# Patient Record
Sex: Male | Born: 1955
Health system: Southern US, Community
[De-identification: ages and names within clinical notes are randomized; demographics above are authoritative.]

## PROBLEM LIST (undated history)

## (undated) DIAGNOSIS — N289 Disorder of kidney and ureter, unspecified: Secondary | ICD-10-CM

## (undated) DIAGNOSIS — E269 Hyperaldosteronism, unspecified: Secondary | ICD-10-CM

## (undated) DIAGNOSIS — T4145XA Adverse effect of unspecified anesthetic, initial encounter: Secondary | ICD-10-CM

## (undated) DIAGNOSIS — T8859XA Other complications of anesthesia, initial encounter: Secondary | ICD-10-CM

## (undated) DIAGNOSIS — I4891 Unspecified atrial fibrillation: Secondary | ICD-10-CM

## (undated) DIAGNOSIS — N419 Inflammatory disease of prostate, unspecified: Secondary | ICD-10-CM

## (undated) DIAGNOSIS — I4892 Unspecified atrial flutter: Secondary | ICD-10-CM

## (undated) DIAGNOSIS — I10 Essential (primary) hypertension: Secondary | ICD-10-CM

## (undated) DIAGNOSIS — N200 Calculus of kidney: Secondary | ICD-10-CM

## (undated) DIAGNOSIS — K219 Gastro-esophageal reflux disease without esophagitis: Secondary | ICD-10-CM

## (undated) DIAGNOSIS — E270 Other adrenocortical overactivity: Secondary | ICD-10-CM

## (undated) DIAGNOSIS — N281 Cyst of kidney, acquired: Secondary | ICD-10-CM

## (undated) DIAGNOSIS — I517 Cardiomegaly: Secondary | ICD-10-CM

## (undated) DIAGNOSIS — M1990 Unspecified osteoarthritis, unspecified site: Secondary | ICD-10-CM

## (undated) DIAGNOSIS — J189 Pneumonia, unspecified organism: Secondary | ICD-10-CM

## (undated) DIAGNOSIS — I499 Cardiac arrhythmia, unspecified: Secondary | ICD-10-CM

## (undated) DIAGNOSIS — R7303 Prediabetes: Secondary | ICD-10-CM

## (undated) HISTORY — PX: INGUINAL HERNIA REPAIR: SUR1180

## (undated) HISTORY — PX: ABLATION: SHX5711

## (undated) HISTORY — DX: Cardiomegaly: I51.7

## (undated) HISTORY — DX: Inflammatory disease of prostate, unspecified: N41.9

## (undated) HISTORY — DX: Unspecified atrial flutter: I48.92

## (undated) HISTORY — DX: Calculus of kidney: N20.0

## (undated) HISTORY — DX: Other adrenocortical overactivity: E27.0

## (undated) HISTORY — DX: Gastro-esophageal reflux disease without esophagitis: K21.9

## (undated) HISTORY — DX: Unspecified atrial fibrillation: I48.91

## (undated) HISTORY — DX: Essential (primary) hypertension: I10

## (undated) HISTORY — PX: OTHER SURGICAL HISTORY: SHX169

---

## 1989-03-26 DIAGNOSIS — J189 Pneumonia, unspecified organism: Secondary | ICD-10-CM

## 1989-03-26 HISTORY — DX: Pneumonia, unspecified organism: J18.9

## 1991-07-27 HISTORY — PX: LUNG BIOPSY: SHX232

## 2001-12-20 ENCOUNTER — Ambulatory Visit (HOSPITAL_COMMUNITY): Admission: RE | Admit: 2001-12-20 | Discharge: 2001-12-20 | Payer: Self-pay | Admitting: Otolaryngology

## 2001-12-21 ENCOUNTER — Ambulatory Visit (HOSPITAL_COMMUNITY): Admission: RE | Admit: 2001-12-21 | Discharge: 2001-12-21 | Payer: Self-pay | Admitting: Cardiology

## 2003-08-05 ENCOUNTER — Encounter: Admission: RE | Admit: 2003-08-05 | Discharge: 2003-08-05 | Payer: Self-pay | Admitting: Internal Medicine

## 2004-05-19 ENCOUNTER — Ambulatory Visit (HOSPITAL_COMMUNITY): Admission: RE | Admit: 2004-05-19 | Discharge: 2004-05-19 | Payer: Self-pay | Admitting: *Deleted

## 2004-06-22 ENCOUNTER — Ambulatory Visit (HOSPITAL_COMMUNITY): Admission: RE | Admit: 2004-06-22 | Discharge: 2004-06-22 | Payer: Self-pay | Admitting: Cardiology

## 2004-06-22 ENCOUNTER — Encounter (INDEPENDENT_AMBULATORY_CARE_PROVIDER_SITE_OTHER): Payer: Self-pay | Admitting: Cardiology

## 2004-06-24 ENCOUNTER — Encounter (INDEPENDENT_AMBULATORY_CARE_PROVIDER_SITE_OTHER): Payer: Self-pay | Admitting: *Deleted

## 2004-06-24 ENCOUNTER — Ambulatory Visit (HOSPITAL_COMMUNITY): Admission: RE | Admit: 2004-06-24 | Discharge: 2004-06-24 | Payer: Self-pay | Admitting: Gastroenterology

## 2004-06-25 HISTORY — PX: OTHER SURGICAL HISTORY: SHX169

## 2004-07-14 ENCOUNTER — Ambulatory Visit (HOSPITAL_COMMUNITY): Admission: AD | Admit: 2004-07-14 | Discharge: 2004-07-14 | Payer: Self-pay | Admitting: Urology

## 2005-09-17 ENCOUNTER — Ambulatory Visit (HOSPITAL_COMMUNITY): Admission: RE | Admit: 2005-09-17 | Discharge: 2005-09-17 | Payer: Self-pay | Admitting: Internal Medicine

## 2005-11-23 ENCOUNTER — Encounter: Admission: RE | Admit: 2005-11-23 | Discharge: 2005-11-23 | Payer: Self-pay | Admitting: Cardiology

## 2005-11-23 ENCOUNTER — Ambulatory Visit: Payer: Self-pay | Admitting: Internal Medicine

## 2005-12-14 ENCOUNTER — Ambulatory Visit: Payer: Self-pay | Admitting: Internal Medicine

## 2006-07-26 HISTORY — PX: UMBILICAL HERNIA REPAIR: SHX196

## 2006-07-26 HISTORY — PX: NASAL SEPTOPLASTY W/ TURBINOPLASTY: SHX2070

## 2006-08-09 ENCOUNTER — Ambulatory Visit (HOSPITAL_BASED_OUTPATIENT_CLINIC_OR_DEPARTMENT_OTHER): Admission: RE | Admit: 2006-08-09 | Discharge: 2006-08-09 | Payer: Self-pay | Admitting: Surgery

## 2006-10-19 ENCOUNTER — Encounter: Admission: RE | Admit: 2006-10-19 | Discharge: 2006-10-19 | Payer: Self-pay | Admitting: Internal Medicine

## 2007-06-17 ENCOUNTER — Emergency Department (HOSPITAL_COMMUNITY): Admission: EM | Admit: 2007-06-17 | Discharge: 2007-06-17 | Payer: Self-pay | Admitting: Emergency Medicine

## 2009-07-26 HISTORY — PX: OTHER SURGICAL HISTORY: SHX169

## 2009-11-30 ENCOUNTER — Inpatient Hospital Stay (HOSPITAL_COMMUNITY): Admission: EM | Admit: 2009-11-30 | Discharge: 2009-12-03 | Payer: Self-pay | Admitting: Urology

## 2009-12-12 ENCOUNTER — Encounter: Admission: RE | Admit: 2009-12-12 | Discharge: 2009-12-12 | Payer: Self-pay | Admitting: Thoracic Surgery

## 2010-01-02 ENCOUNTER — Ambulatory Visit (HOSPITAL_COMMUNITY): Admission: RE | Admit: 2010-01-02 | Discharge: 2010-01-02 | Payer: Self-pay | Admitting: Thoracic Surgery

## 2010-01-15 ENCOUNTER — Ambulatory Visit: Payer: Self-pay | Admitting: Thoracic Surgery

## 2010-01-20 ENCOUNTER — Ambulatory Visit: Payer: Self-pay | Admitting: Cardiothoracic Surgery

## 2010-01-22 ENCOUNTER — Inpatient Hospital Stay (HOSPITAL_COMMUNITY): Admission: RE | Admit: 2010-01-22 | Discharge: 2010-01-25 | Payer: Self-pay | Admitting: Thoracic Surgery

## 2010-01-22 ENCOUNTER — Encounter: Payer: Self-pay | Admitting: Cardiothoracic Surgery

## 2010-01-22 ENCOUNTER — Ambulatory Visit: Payer: Self-pay | Admitting: Cardiothoracic Surgery

## 2010-01-30 ENCOUNTER — Ambulatory Visit: Payer: Self-pay | Admitting: Cardiothoracic Surgery

## 2010-02-12 ENCOUNTER — Ambulatory Visit: Payer: Self-pay | Admitting: Cardiothoracic Surgery

## 2010-02-12 ENCOUNTER — Encounter: Admission: RE | Admit: 2010-02-12 | Discharge: 2010-02-12 | Payer: Self-pay | Admitting: Cardiothoracic Surgery

## 2010-05-14 ENCOUNTER — Ambulatory Visit: Payer: Self-pay | Admitting: Cardiothoracic Surgery

## 2010-05-14 ENCOUNTER — Encounter: Admission: RE | Admit: 2010-05-14 | Discharge: 2010-05-14 | Payer: Self-pay | Admitting: Cardiothoracic Surgery

## 2010-06-22 ENCOUNTER — Emergency Department (HOSPITAL_COMMUNITY)
Admission: EM | Admit: 2010-06-22 | Discharge: 2010-06-22 | Payer: Self-pay | Source: Home / Self Care | Admitting: Emergency Medicine

## 2010-08-01 ENCOUNTER — Emergency Department (HOSPITAL_COMMUNITY)
Admission: EM | Admit: 2010-08-01 | Discharge: 2010-08-01 | Payer: Self-pay | Source: Home / Self Care | Admitting: Emergency Medicine

## 2010-08-10 LAB — POCT I-STAT, CHEM 8
BUN: 20 mg/dL (ref 6–23)
Calcium, Ion: 1.05 mmol/L — ABNORMAL LOW (ref 1.12–1.32)
Chloride: 104 mEq/L (ref 96–112)
Creatinine, Ser: 1.5 mg/dL (ref 0.4–1.5)
Glucose, Bld: 108 mg/dL — ABNORMAL HIGH (ref 70–99)
HCT: 48 % (ref 39.0–52.0)
Hemoglobin: 16.3 g/dL (ref 13.0–17.0)
Potassium: 3.4 mEq/L — ABNORMAL LOW (ref 3.5–5.1)
Sodium: 142 mEq/L (ref 135–145)
TCO2: 29 mmol/L (ref 0–100)

## 2010-10-06 LAB — CBC
HCT: 43.2 % (ref 39.0–52.0)
Hemoglobin: 14.2 g/dL (ref 13.0–17.0)
MCH: 28.5 pg (ref 26.0–34.0)
MCHC: 32.9 g/dL (ref 30.0–36.0)
MCV: 86.7 fL (ref 78.0–100.0)
Platelets: 187 10*3/uL (ref 150–400)
RBC: 4.98 MIL/uL (ref 4.22–5.81)
RDW: 15.4 % (ref 11.5–15.5)
WBC: 8.8 10*3/uL (ref 4.0–10.5)

## 2010-10-06 LAB — DIFFERENTIAL
Basophils Absolute: 0 10*3/uL (ref 0.0–0.1)
Basophils Relative: 0 % (ref 0–1)
Eosinophils Absolute: 0.1 10*3/uL (ref 0.0–0.7)
Eosinophils Relative: 1 % (ref 0–5)
Lymphocytes Relative: 16 % (ref 12–46)
Lymphs Abs: 1.4 10*3/uL (ref 0.7–4.0)
Monocytes Absolute: 0.5 10*3/uL (ref 0.1–1.0)
Monocytes Relative: 6 % (ref 3–12)
Neutro Abs: 6.7 10*3/uL (ref 1.7–7.7)
Neutrophils Relative %: 77 % (ref 43–77)

## 2010-10-06 LAB — PROTIME-INR
INR: 3.04 — ABNORMAL HIGH (ref 0.00–1.49)
Prothrombin Time: 31.5 seconds — ABNORMAL HIGH (ref 11.6–15.2)

## 2010-10-06 LAB — POCT I-STAT, CHEM 8
BUN: 23 mg/dL (ref 6–23)
Chloride: 103 mEq/L (ref 96–112)
Sodium: 142 mEq/L (ref 135–145)

## 2010-10-06 LAB — POCT CARDIAC MARKERS
CKMB, poc: 2.9 ng/mL (ref 1.0–8.0)
Myoglobin, poc: 347 ng/mL (ref 12–200)
Troponin i, poc: <0.05 ng/mL (ref 0.00–0.09)

## 2010-10-11 LAB — BASIC METABOLIC PANEL
BUN: 13 mg/dL (ref 6–23)
BUN: 14 mg/dL (ref 6–23)
CO2: 27 mEq/L (ref 19–32)
CO2: 33 mEq/L — ABNORMAL HIGH (ref 19–32)
Calcium: 8.3 mg/dL — ABNORMAL LOW (ref 8.4–10.5)
Calcium: 9.3 mg/dL (ref 8.4–10.5)
Chloride: 101 mEq/L (ref 96–112)
Chloride: 96 mEq/L (ref 96–112)
Creatinine, Ser: 1.19 mg/dL (ref 0.4–1.5)
Creatinine, Ser: 1.25 mg/dL (ref 0.4–1.5)
GFR calc Af Amer: >60 mL/min (ref >60)
GFR calc Af Amer: >60 mL/min (ref >60)
GFR calc non Af Amer: 60 mL/min — ABNORMAL LOW (ref >60)
GFR calc non Af Amer: >60 mL/min (ref >60)
Glucose, Bld: 122 mg/dL — ABNORMAL HIGH (ref 70–99)
Glucose, Bld: 125 mg/dL — ABNORMAL HIGH (ref 70–99)
Potassium: 3.2 mEq/L — ABNORMAL LOW (ref 3.5–5.1)
Potassium: 3.7 mEq/L (ref 3.5–5.1)
Sodium: 136 mEq/L (ref 135–145)
Sodium: 137 mEq/L (ref 135–145)

## 2010-10-11 LAB — COMPREHENSIVE METABOLIC PANEL
ALT: 18 U/L (ref 0–53)
ALT: 23 U/L (ref 0–53)
AST: 30 U/L (ref 0–37)
AST: 28 U/L (ref 0–37)
Albumin: 3 g/dL — ABNORMAL LOW (ref 3.5–5.2)
Albumin: 3.6 g/dL (ref 3.5–5.2)
Alkaline Phosphatase: 49 U/L (ref 39–117)
BUN: 15 mg/dL (ref 6–23)
CO2: 31 mEq/L (ref 19–32)
CO2: 29 mEq/L (ref 19–32)
Calcium: 8.7 mg/dL (ref 8.4–10.5)
Calcium: 9.2 mg/dL (ref 8.4–10.5)
Chloride: 97 mEq/L (ref 96–112)
Creatinine, Ser: 1.47 mg/dL (ref 0.4–1.5)
Creatinine, Ser: 1.4 mg/dL (ref 0.4–1.5)
GFR calc Af Amer: >60 mL/min (ref >60)
GFR calc Af Amer: >60 mL/min (ref >60)
GFR calc non Af Amer: 50 mL/min — ABNORMAL LOW (ref >60)
GFR calc non Af Amer: 53 mL/min — ABNORMAL LOW (ref >60)
Glucose, Bld: 134 mg/dL — ABNORMAL HIGH (ref 70–99)
Potassium: 3.5 mEq/L (ref 3.5–5.1)
Sodium: 135 mEq/L (ref 135–145)
Sodium: 136 mEq/L (ref 135–145)
Total Bilirubin: 0.7 mg/dL (ref 0.3–1.2)
Total Protein: 5.8 g/dL — ABNORMAL LOW (ref 6.0–8.3)
Total Protein: 6.5 g/dL (ref 6.0–8.3)

## 2010-10-11 LAB — BLOOD GAS, ARTERIAL
Bicarbonate: 26.2 mEq/L — ABNORMAL HIGH (ref 20.0–24.0)
Drawn by: 206361
FIO2: 0.21 %
Patient temperature: 98.6
pH, Arterial: 7.462 — ABNORMAL HIGH (ref 7.350–7.450)
pO2, Arterial: 84 mmHg (ref 80.0–100.0)

## 2010-10-11 LAB — CBC
HCT: 35.7 % — ABNORMAL LOW (ref 39.0–52.0)
HCT: 36.4 % — ABNORMAL LOW (ref 39.0–52.0)
HCT: 37.3 % — ABNORMAL LOW (ref 39.0–52.0)
Hemoglobin: 11.7 g/dL — ABNORMAL LOW (ref 13.0–17.0)
Hemoglobin: 12.3 g/dL — ABNORMAL LOW (ref 13.0–17.0)
Hemoglobin: 12.5 g/dL — ABNORMAL LOW (ref 13.0–17.0)
Hemoglobin: 14 g/dL (ref 13.0–17.0)
MCH: 29.6 pg (ref 26.0–34.0)
MCH: 30.2 pg (ref 26.0–34.0)
MCH: 30.4 pg (ref 26.0–34.0)
MCHC: 32.7 g/dL (ref 30.0–36.0)
MCHC: 33.7 g/dL (ref 30.0–36.0)
MCHC: 33.8 g/dL (ref 30.0–36.0)
MCHC: 33.1 g/dL (ref 30.0–36.0)
MCV: 89.7 fL (ref 78.0–100.0)
MCV: 89.8 fL (ref 78.0–100.0)
MCV: 90.6 fL (ref 78.0–100.0)
Platelets: 134 10*3/uL — ABNORMAL LOW (ref 150–400)
Platelets: 146 10*3/uL — ABNORMAL LOW (ref 150–400)
Platelets: 164 10*3/uL (ref 150–400)
Platelets: 192 10*3/uL (ref 150–400)
RBC: 3.94 MIL/uL — ABNORMAL LOW (ref 4.22–5.81)
RBC: 4.05 MIL/uL — ABNORMAL LOW (ref 4.22–5.81)
RBC: 4.15 MIL/uL — ABNORMAL LOW (ref 4.22–5.81)
RBC: 4.76 MIL/uL (ref 4.22–5.81)
RDW: 16.1 % — ABNORMAL HIGH (ref 11.5–15.5)
RDW: 16.5 % — ABNORMAL HIGH (ref 11.5–15.5)
RDW: 16.6 % — ABNORMAL HIGH (ref 11.5–15.5)
WBC: 10.2 10*3/uL (ref 4.0–10.5)
WBC: 11.4 10*3/uL — ABNORMAL HIGH (ref 4.0–10.5)
WBC: 11.8 10*3/uL — ABNORMAL HIGH (ref 4.0–10.5)

## 2010-10-11 LAB — APTT: aPTT: 29 seconds (ref 24–37)

## 2010-10-11 LAB — GLUCOSE, CAPILLARY
Glucose-Capillary: 119 mg/dL — ABNORMAL HIGH (ref 70–99)
Glucose-Capillary: 141 mg/dL — ABNORMAL HIGH (ref 70–99)
Glucose-Capillary: 126 mg/dL — ABNORMAL HIGH (ref 70–99)
Glucose-Capillary: 141 mg/dL — ABNORMAL HIGH (ref 70–99)

## 2010-10-11 LAB — TYPE AND SCREEN

## 2010-10-11 LAB — URINE CULTURE: Culture: NO GROWTH

## 2010-10-11 LAB — POCT I-STAT 3, ART BLOOD GAS (G3+)
Bicarbonate: 29 mEq/L — ABNORMAL HIGH (ref 20.0–24.0)
Patient temperature: 99.2
pH, Arterial: 7.42 (ref 7.350–7.450)
pO2, Arterial: 78 mmHg — ABNORMAL LOW (ref 80.0–100.0)

## 2010-10-11 LAB — URINE MICROSCOPIC-ADD ON

## 2010-10-11 LAB — URINALYSIS, ROUTINE W REFLEX MICROSCOPIC
Glucose, UA: NEGATIVE mg/dL
Nitrite: NEGATIVE
Specific Gravity, Urine: 1.014 (ref 1.005–1.030)
pH: 5.5 (ref 5.0–8.0)

## 2010-10-11 LAB — ABO/RH: ABO/RH(D): B NEG

## 2010-10-12 LAB — PROTIME-INR
INR: 1.11 (ref 0.00–1.49)
Prothrombin Time: 14.2 seconds (ref 11.6–15.2)

## 2010-10-12 LAB — APTT: aPTT: 33 seconds (ref 24–37)

## 2010-10-12 LAB — CBC
HCT: 40.1 % (ref 39.0–52.0)
Hemoglobin: 13 g/dL (ref 13.0–17.0)
MCV: 89.8 fL (ref 78.0–100.0)
Platelets: 195 10*3/uL (ref 150–400)
RDW: 16 % — ABNORMAL HIGH (ref 11.5–15.5)
WBC: 8.1 10*3/uL (ref 4.0–10.5)

## 2010-10-13 LAB — BASIC METABOLIC PANEL
BUN: 17 mg/dL (ref 6–23)
BUN: 9 mg/dL (ref 6–23)
CO2: 29 mEq/L (ref 19–32)
CO2: 30 mEq/L (ref 19–32)
Calcium: 8.5 mg/dL (ref 8.4–10.5)
Calcium: 8.6 mg/dL (ref 8.4–10.5)
Chloride: 105 mEq/L (ref 96–112)
Creatinine, Ser: 1.42 mg/dL (ref 0.4–1.5)
GFR calc Af Amer: >60 mL/min (ref >60)
GFR calc non Af Amer: 47 mL/min — ABNORMAL LOW (ref >60)
GFR calc non Af Amer: 56 mL/min — ABNORMAL LOW (ref >60)
Glucose, Bld: 108 mg/dL — ABNORMAL HIGH (ref 70–99)
Glucose, Bld: 132 mg/dL — ABNORMAL HIGH (ref 70–99)
Glucose, Bld: 142 mg/dL — ABNORMAL HIGH (ref 70–99)
Potassium: 3.2 mEq/L — ABNORMAL LOW (ref 3.5–5.1)
Potassium: 3.3 mEq/L — ABNORMAL LOW (ref 3.5–5.1)
Sodium: 134 mEq/L — ABNORMAL LOW (ref 135–145)
Sodium: 139 mEq/L (ref 135–145)

## 2010-10-13 LAB — CULTURE, BLOOD (ROUTINE X 2): Culture: NO GROWTH

## 2010-10-13 LAB — CBC
HCT: 36 % — ABNORMAL LOW (ref 39.0–52.0)
HCT: 36.7 % — ABNORMAL LOW (ref 39.0–52.0)
Hemoglobin: 12 g/dL — ABNORMAL LOW (ref 13.0–17.0)
Hemoglobin: 12.3 g/dL — ABNORMAL LOW (ref 13.0–17.0)
MCHC: 32.9 g/dL (ref 30.0–36.0)
MCV: 89.6 fL (ref 78.0–100.0)
Platelets: 156 10*3/uL (ref 150–400)
Platelets: 191 10*3/uL (ref 150–400)
RBC: 4 MIL/uL — ABNORMAL LOW (ref 4.22–5.81)
RBC: 4.07 MIL/uL — ABNORMAL LOW (ref 4.22–5.81)
RDW: 15.9 % — ABNORMAL HIGH (ref 11.5–15.5)
RDW: 16.1 % — ABNORMAL HIGH (ref 11.5–15.5)
WBC: 10.9 10*3/uL — ABNORMAL HIGH (ref 4.0–10.5)
WBC: 14.5 10*3/uL — ABNORMAL HIGH (ref 4.0–10.5)
WBC: 16.3 10*3/uL — ABNORMAL HIGH (ref 4.0–10.5)

## 2010-10-13 LAB — DIFFERENTIAL
Basophils Absolute: 0.1 10*3/uL (ref 0.0–0.1)
Basophils Relative: 0 % (ref 0–1)
Lymphocytes Relative: 8 % — ABNORMAL LOW (ref 12–46)
Lymphs Abs: 1.3 10*3/uL (ref 0.7–4.0)
Monocytes Relative: 8 % (ref 3–12)
Neutro Abs: 11.8 10*3/uL — ABNORMAL HIGH (ref 1.7–7.7)
Neutro Abs: 14 10*3/uL — ABNORMAL HIGH (ref 1.7–7.7)
Neutrophils Relative %: 82 % — ABNORMAL HIGH (ref 43–77)

## 2010-10-13 LAB — URINALYSIS, ROUTINE W REFLEX MICROSCOPIC
Nitrite: NEGATIVE
Protein, ur: NEGATIVE mg/dL
Urobilinogen, UA: 0.2 mg/dL (ref 0.0–1.0)

## 2010-10-13 LAB — URINE CULTURE
Colony Count: NO GROWTH
Culture: NO GROWTH

## 2010-11-05 ENCOUNTER — Encounter: Payer: Self-pay | Admitting: Cardiothoracic Surgery

## 2010-11-10 ENCOUNTER — Other Ambulatory Visit: Payer: Self-pay | Admitting: Dermatology

## 2010-11-18 ENCOUNTER — Other Ambulatory Visit: Payer: Self-pay | Admitting: Cardiothoracic Surgery

## 2010-11-18 DIAGNOSIS — R911 Solitary pulmonary nodule: Secondary | ICD-10-CM

## 2010-11-19 ENCOUNTER — Encounter (INDEPENDENT_AMBULATORY_CARE_PROVIDER_SITE_OTHER): Payer: BC Managed Care – PPO | Admitting: Cardiothoracic Surgery

## 2010-11-19 ENCOUNTER — Ambulatory Visit
Admission: RE | Admit: 2010-11-19 | Discharge: 2010-11-19 | Disposition: A | Discharge status: Home / Self Care | Payer: BC Managed Care – PPO | Source: Ambulatory Visit | Attending: Cardiothoracic Surgery | Admitting: Cardiothoracic Surgery

## 2010-11-19 DIAGNOSIS — D213 Benign neoplasm of connective and other soft tissue of thorax: Secondary | ICD-10-CM

## 2010-11-19 DIAGNOSIS — R911 Solitary pulmonary nodule: Secondary | ICD-10-CM

## 2010-11-20 NOTE — Assessment & Plan Note (Signed)
OFFICE VISIT  Casey [name], Casey [name] DOB:  12/06/1955                                        November 19, 2010 CHART #:  [id number]  The patient returns to the office in followup after resection of a left chest wall tumor which final pathology on January 22, 2010.  The final path on this revealed a 6.8-cm schwannoma.  Since that time, he has had followup chest x-ray.  Since last seen, he has had episode of vasomotor syncope when he had a syncopal episode and fell and broke his nose.  He has continued on Coumadin for intermittent atrial fibrillation.  He has had no discomfort in his left chest.  On exam, his blood pressure is 132/85, pulse is 70, respiratory rate is 20 and O2 sats 96% on room air.  He appears in sinus rhythm on exam today.  He has no cervical or supraclavicular adenopathy.  His lungs are clear bilaterally.  The left chest incision from the resection of the schwannoma is well healed.  Followup chest x-ray shows chronic slight elevation of the left hemidiaphragm compared to the right.  There is no change in contour of the ribs or chest wall to indicate any evidence of recurrence.  Overall, he has made good recovery from resection of schwannoma in the left chest wall.  We will plan to see him back in 1 year with a followup chest x-ray.  Sheliah Plane, MD Electronically Signed  EG/MEDQ  D:  11/19/2010  T:  11/20/2010  Job:  [id number]  cc:   Theressa Millard, M.D. Jake Bathe, MD

## 2010-12-08 NOTE — Letter (Signed)
January 15, 2010   Theressa Millard, MD  [address] [place], [place] [address]   Re:  Casey [name], Casey [name]               DOB:  [birth year]   Dear Fayrene Fearing,   I saw the patient in the office today for his left chest wall tumor.  As  you know, in 1993, we did a left VATS after he had bilateral pneumonia  and pleuritis, but he ended up having a chest tube.  In 2007, he was  noted to have a small nodule on his left chest, which was in the same  area where we had done his surgery and we thought this was just a  scarring from his previous surgery since it was near a trocar site.  However, he was recently readmitted with atrial fibrillation and chronic  prostatitis and another chest x-ray showed that this had enlarged to 43  mm.  A CT scan confirmed this and a needle biopsy was done and was read  as a schwannoma.  It is  more lateral on the nerve root than usually  seen for a schwannoma, he has some mild pain, has had some paresthesias  in the nerve root.  It is between the sixth and the seventh rib at the  posterior axillary line.   MEDICATIONS:  He takes:  1. Diltiazem 100 mg a day.  2. Tambocor 150 mg twice a day.  3. Metoprolol 25 mg once a day.  4. Dyazide daily.  5. Potassium 10 mEq twice a day.  6. Allopurinol 100 mg a day.  7. Prevacid 30 mg a day.  8. He was on Coumadin, but that has been stopped.   ALLERGIES:  He is allergic to sulfa.   PAST MEDICAL HISTORY:  He recently had a resistant E. coli infection  from chronic prostatitis.  He has also had kidney stones, has a  creatinine of 1.6 and has intermittent atrial fibrillation.  He also has  hypertension.   FAMILY HISTORY:  Positive for atrial fibrillation and negative for  cancer.   SOCIAL HISTORY:  He is married, he has 2 children.  He is a physician.  Does not smoke.  Occasional alcohol intake.   REVIEW OF SYSTEMS:  VITAL SIGNS:  He is 240 pounds.  He is 6 feet 2  inches.  GENERAL:  His weight has been stable.  CARDIAC:  He has had atrial fibrillation.  No angina.  PULMONARY:  No hemoptysis, asthma, or cough.  See past medical history.  GI:  Reflux.  GU:  See past medical history.  VASCULAR:  No claudication, DVT, or TIAs.  NEUROLOGICAL:  No dizziness, headaches, blackouts, or seizures.  MUSCULOSKELETAL:  He has got arthritis.  HEMATOLOGIC:  No problems with bleeding, but is on Coumadin.  No  problems with anemia.  EYE/ENT:  No changes in eyesight, but has had some problems with  decreased hearing.   PHYSICAL EXAMINATION:  Vital Signs:  His blood pressure 130/80, pulse  80, respirations 18, and sats were 97%.  Head, Eyes, Ears, Nose, and  Throat:  Unremarkable.  Neck:  Supple without thyromegaly.  There is no  supraclavicular or axillary adenopathy.  Chest:  Clear to auscultation  and percussion. On the left side, there is 3 thoracoscopy incisions, one  at the anterior axillary line at the seventh intercostal space, one at  the posterior axillary line at the eighth intercostal space, and one at  the  fifth intercostal space.  Abdomen:  Soft.  Extremities:  Pulses are  2+.  There is no clubbing or edema.  Neurologic:  He is oriented x3.  Sensory and motor intact.   I have discussed the excision of his schwannoma.  We will try to do this  through a VATS procedure, but because of his previous infection, he may  have too many adhesions that may require a small thoracotomy.  I also  discussed the situation that this was not a schwannoma and we had to do  a wide resection.  He would require chest wall incisions with  reconstruction.  I will plan to do the surgery on January 22, 2010, at [facility].  I appreciate the opportunity of seeing the patient.   Ines Bloomer, M.D.  Electronically Signed   DPB/MEDQ  D:  01/15/2010  T:  01/16/2010  Job:  [id number]   cc:   Heloise Purpura, MD

## 2010-12-08 NOTE — Assessment & Plan Note (Signed)
OFFICE VISIT   Casey [name], Casey [name]  DOB:  1955-07-27                                        January 06, 2010  CHART #:  [id number]   I called the patient today and informed him the biopsy results.  Dr.  Guerry Bruin did special stains and felt this is a benign schwannoma.  Given the size of the schwannoma, I recommended that he have resection.  It is in a somewhat unusual location and it is further away from the  intervertebral foramen than usual.  Since he has had previous VATS on  the left side, I think he may require a thoracotomy to remove it.  I  told him that there was a small chance of malignant degeneration, but  generally most of these are benign.  He is going to discuss this with  his office staff and wife and let us know when to schedule the surgery.  I did explain to him that he would probably require a thoracotomy  because of his previous scarring and would be back to work in about 4-6  weeks.   Ines Bloomer, M.D.  Electronically Signed   DPB/MEDQ  D:  01/06/2010  T:  01/07/2010  Job:  [id number]

## 2010-12-08 NOTE — Assessment & Plan Note (Signed)
OFFICE VISIT   Casey [name], Casey [name]  DOB:  Apr 30, 1956                                        Dec 04, 2009  CHART #:  [id number]   I had a telephone conversation with Dr. [name] today regarding this  slowly enlarging left pleural mass.  It has increased in size from about  1.5 to 2 cm in 2007 up to at least 3 cm in 2011.  We initially thought  that this was probably due to some slow nodule such as a fibrous tumor  related to his previous surgery in 1993, but it is continuing to  enlarge, so we have to repeat a CT scan and consider doing a biopsy. He  will probably require resection.  I discussed this with Dr. [name] and  we will proceed with a CT scan without contrast since his creatinine is  in 1.5.  He had been recently in the hospital for prostate biopsy.   Ines Bloomer, M.D.  Electronically Signed   DPB/MEDQ  D:  12/04/2009  T:  12/05/2009  Job:  [id number]

## 2010-12-08 NOTE — Assessment & Plan Note (Signed)
OFFICE VISIT   Casey [name], Casey [name]  DOB:  [birth year]                                        May 14, 2010  CHART #:  [id number]   HISTORY:  The patient returns to the office today in followup after a  schwannoma resected from the left chest on January 22, 2010.  Since  surgery, he has done well, returning to normal activities with minimal  discomfort in the left chest.  He notes that the paresthesias over the  incision early postoperatively have resolved.  He has had no shortness  of breath.  He continues on Coumadin.  He returns to the office today  for followup chest x-ray.   PHYSICAL EXAMINATION:  His blood pressure 135/83, pulse 52 and regular,  respiratory rate is 18, and O2 sats 94% on room air.  His lungs are  clear bilaterally.  Cardiac exam reveals regular rate without murmur or  gallop.  He has no pedal edema or calf tenderness.   DIAGNOSTIC TESTS:  Followup chest x-ray shows some slight deformity of  the ribs consistent with previous resection of the schwannoma.  There is  no evidence of soft tissue mass in the area of resection.   IMPRESSION:  Overall, I am pleased with his progress.  We plan to obtain  a followup chest x-ray in 6 months and if no change, then we will wait 1  year.   Sheliah Plane, MD  Electronically Signed   EG/MEDQ  D:  05/14/2010  T:  05/15/2010  Job:  [id number]   cc:   Theressa Millard, M.D.

## 2010-12-08 NOTE — Assessment & Plan Note (Signed)
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  Postoperatively, he has tolerated this well.  He is now back to work  half a day, has had some minor discomfort in his left chest wall with  hyperesthesias over the left anterior chest.  He notes that this is  improving.  He is not taking any pain medicine since 2 days after being  discharged.  He returns today for a followup visit.   On exam today, his blood pressure 120/77, pulse 56, respiratory rate 18,  O2 sats 96%.  He appears comfortable.  His lungs are clear bilaterally.  The left chest incisions are all well healed.  He has no tenderness in  his calves.   Followup chest x-ray shows postoperative changes in the left chest.  Otherwise, clear lung fields.   He had stopped his Coumadin for a period of time preoperatively for  paroxysmal atrial fibrillation.  He is now back on Coumadin as monitored  by Dr. Amil Amen, [facility].  Overall, I am pleased with his  progress and plan to see him back in 3 months with a followup chest x-  ray.   Sheliah Plane, MD  Electronically Signed   EG/MEDQ  D:  02/12/2010  T:  02/12/2010  Job:  [id number]   cc:   Theressa Millard, M.D.  Francisca December, M.D.

## 2010-12-11 NOTE — Op Note (Signed)
NAME:  LUKUS, BINION NO.:  [id number]   MEDICAL RECORD NO.:  [id number]          PATIENT TYPE:  AMB   LOCATION:  ENDO                         [facility]ACILITY[facility]  [facility]   PHYSICIAN:  Danise Edge, M.D.   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 Esophagogastroduodenoscopy is scheduled to rule out  Barrett's esophagus and erosive esophagitis. Dr. [name] is also due to  undergo his first screening colonoscopy with polypectomy to prevent colon  cancer.   ENDOSCOPIST:  Danise Edge, M.D.   PREMEDICATION:  Versed 10 mg, fentanyl 100 mcg.   PROCEDURE:  Diagnostic esophagogastroduodenoscopy. 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 I  administered intravenous fentanyl and intravenous Versed to achieve  conscious sedation for the procedure. The patient's blood pressure, oxygen  saturation, and cardiac rhythm were monitored throughout the procedure and  documented in the medical record.   The Olympus gastric scope was passed through the posterior hypopharynx into  the proximal esophagus without difficulty. Hypopharynx, larynx, and vocal  cords appeared normal.   Esophagoscopy:  The proximal mid and lower segments of the esophageal mucosa  appeared completely normal. There is no endoscopic evidence for the presence  of Barrett's esophagus, erosive esophagitis, esophageal mucosal scarring.   Gastroscopy:  Retroflexed view of the gastric cardia and fundus were normal.  There are no signs of a hiatal hernia. Two small (1 mm/53mm) gastric polyps  were removed with the cold biopsy forceps from the mid gastric body. The  gastric body, antrum, and pylorus appeared  normal.   Duodenoscopy:  The duodenal bulb, mid duodenum, and distal duodenum appeared  normal.   ASSESSMENT:  Two small gastric body polyps were removed and appeared most  likely to be gastric gland polyps. Otherwise, normal  esophagogastroduodenoscopy. There is no endoscopic evidence for the presence  of Barrett's esophagus or esophageal mucosa injury despite chronic  gastroesophageal reflux.   PROCEDURE:  Screening colonoscopy. Anal inspection and digital rectal exam  were normal. The prostate was nonnodular. The Olympus adjustable pediatric  colonoscopy was introduced into the rectum and advanced to the cecum.  Colonic preparation for exam today was excellent.   Rectum:  Normal.   Sigmoid colon and descending colon:  At 55 cm from the anal verge, a 2-mm  sessile polyp was removed with the electrocautery snare.   Splenic flexure:  Normal.   Transverse colon:  Normal.   Hepatic flexure:  Normal.   Ascending colon:  Normal.   Cecum and ileocecal valve:  Normal.   ASSESSMENT:  A small polyp was removed from the left colon at 55 cm from the  anal verge; otherwise normal screening proctocolonoscopy to the cecum.   RECOMMENDATIONS:  If the small left colon polyp returns adenomatous  pathologically, Dr. [name] should undergo a repeat  colonoscopy in  approximately five years.      MJ/MEDQ  D:  06/24/2004  T:  06/24/2004  Job:  [id number]   cc:   Francisca December, M.D.  [address]  [address]  [place]  [place] [address]  Fax: [phone]   Kasai Beltran. Casey [name], M.D.  [address] [place]  [place] [address]  Fax: [phone]   Theressa Millard, M.D.  [address] [place]  [place] [address]  Fax: [phone]

## 2010-12-11 NOTE — Op Note (Signed)
NAME:  AAIDYN, SAN NO.:  [id number]   MEDICAL RECORD NO.:  [id number]          PATIENT TYPE:  AMB   LOCATION:  ENDO                         FACILITY:  [facility]   PHYSICIAN:  Francisca December, M.D.  DATE OF BIRTH:  1956-03-20   DATE OF PROCEDURE:  DATE OF DISCHARGE:                                 OPERATIVE REPORT   DATE OF PROCEDURE:  June 22, 2004.   PROCEDURE PERFORMED:  Elective cardioversion.   SURGEON:  Francisca December, MD.   INDICATION:  Recurrent atrial fibrillation, duration of 36 to 72 hours.   PROCEDURAL NOTE:  Following completion of the transesophageal  echocardiogram, which confirmed the absence of any left atrial thrombus and  showed good visualization of the left atrial appendage and no structural  heart disease, the patient was placed supine with anterior and posterior  defibrillator pads present.  Anesthesia was present and administered 120 mg  of propathol, inducing deep anesthesia.  Once this was confirmed and while  monitoring heart rate, blood pressure, O2 saturation, and ECG, the patient  received a single transthoracic dose of biphasic direct current energy,  which was synchronized 120 joules.  This resulted in prompt return of sinus  rhythm.   IMPRESSION:  Successful elective cardioversion, atrial fibrillation to sinus  rhythm.   PLAN:  Begin Flecainide 50 mg p.o. b.i.d.  Continue Cardizem 240 mg p.o.  daily and Metoprolol 50 mg p.o. b.i.d.  Follow up in the office in two  weeks.  ECG in three days.      John   JHE/MEDQ  D:  06/22/2004  T:  06/22/2004  Job:  [id number]

## 2010-12-11 NOTE — Cardiovascular Report (Signed)
[facility]  Patient:    PURCELL, JUNGBLUTH MD Visit Number: [id number] MRN: [id number]          Service Type: CAT Location: [facility] 28[address] 20 Attending Physician:  Corliss Marcus Dictated by:   Francisca December, M.D. Proc. Date: 12/21/01 Admit Date:  12/21/2001 Discharge Date: 12/21/2001   CC:         Theressa Millard, M.D.   Cardiac Catheterization  PROCEDURE:  Elective cardioversion.  CARDIOLOGIST:  Francisca December, M.D.  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  This has occurred within the last 48 hours.  He had a clear and abrupt onset of symptoms associated with this which amounted to intermittent tachycardic palpitation.  He has felt this briefly in the past.  He is brought now to the outpatient department for elective cardioversion.  He has received 150 mg of metoprolol in three separate doses over the last 18 hours.  He did have a transthoracic 2-D echocardiogram in the office today with good visualization of the left atrium.  This is not enlarged.  He had no evidence transthoracically of intracardiac thrombus.  ANESTHESIA:  Edwin Cap. Zoila Shutter, M.D., pentothal 350 mg IV.  COMPLICATIONS:  None.  FINDINGS:  Following establishment of deep anesthesia with IV pentothal and in the presence of anesthesiology while monitoring heart rate, blood pressure, O2 saturation, and ECG, the patient underwent a single dose of transthoracic energy, 200 watt seconds, synchronized biphasic.  This resulted in prompt return of sinus rhythm.  He received 10 mg IV metoprolol intravenously and is currently awakening without any sequelae.  IMPRESSION:   Successful elective cardioversion, atrial fibrillation with rapid ventricular response to sinus rhythm.  PLAN: 1. Will begin Toprol XL 100 mg p.o. q.d. in addition to his previous  antihypertensive medications. 2. Aspirin 1 p.o. q.d. 3. Will see him in the office at the next available appointment.  Call in    the meantime with any problems. Dictated by:   Francisca December, M.D. Attending Physician:  Corliss Marcus DD:  12/21/01 TD:  12/22/01 Job: [id number]

## 2010-12-11 NOTE — Op Note (Signed)
NAME:  NADIA, TORR NO.:  [id number]   MEDICAL RECORD NO.:  [id number]          PATIENT TYPE:  OIB   LOCATION:  2860                         FACILITY:  [facility]   PHYSICIAN:  Armanda Magic, M.D.     DATE OF BIRTH:  1955/10/13   DATE OF PROCEDURE:  05/19/2004  DATE OF DISCHARGE:                                 OPERATIVE REPORT   REFERRING PHYSICIANS:  Theressa Millard, M.D., Francisca December, M.D.   PROCEDURE:  Direct current cardioversion.   OPERATOR:  Armanda Magic, M.D.   INDICATIONS:  Atrial fibrillation.   COMPLICATIONS:  None.   IV MEDICATIONS:  Propofol 200 mg IV.   This is a 55 year old white male with a history of hypertension and  paroxysmal A fib, status post cardioversion in 2003, who presented to the  office this morning with new onset of atrial fibrillation yesterday evening.  He was out playing tennis and all of a sudden felt his heart go into a  regular rhythm.  He took 200 mg of metoprolol without any significant change  and awoke this morning feeling the same.  He took another 100 mg of  metoprolol without conversion to sinus rhythm.  Presented to our office for  an EKG, which confirmed atrial fibrillation with rapid ventricular response.  He now presents for elective cardioversion.  He is less than 24 hours into  the atrial fibrillation and knows exactly when it started last night.  A 2D  echocardiogram was performed in our office, which showed no evidence of left  atrial clot or right atrial clot.  LV function was normal with an EF of 60%.  No valvular abnormalities except for some mild MR and TR.  The left atrial  size was upper limits of normal at 4.1 cm.  He now presents for  cardioversion.   The patient was brought to the day heart sounds in a fasting, nonsedated  state.  Informed consent was obtained.  The patient was connected to  continuous heart rate and pulse oximetry monitoring and intermittent blood  pressure monitoring.   Defibrillator pads were placed in the anterior and  posterior left side of his chest.  Once anesthesia was administered and  adequate anesthesia was obtained, a 75 joules synchronized shock was  delivered, which was unsuccessful in converting the patient to his normal  sinus rhythm.  A 100 joule synchronized shock was then delivered, which  successfully converted the patient to sinus rhythm.  The patient tolerated  the procedure well without complications.   ASSESSMENT:  1.  New onset atrial fibrillation of less than 24 hours duration with no      evidence of clot by 2D echocardiogram.  2.  Status post successful direct cardioversion to sinus rhythm.  3.  Hypertension, stable.   PLAN:  1.  Discharge to home after bedrest and once ambulating.  2.  Follow up with Dr. Amil Amen in one week.       TT/MEDQ  D:  05/19/2004  T:  05/19/2004  Job:  [id number]   cc:   Francisca December, M.D.  [address]  [address]  [place]  [place] [address]  Fax: [phone]   Theressa Millard, M.D.  [address] [place]  [place] [address]  Fax: [phone]

## 2010-12-11 NOTE — Op Note (Signed)
NAME:  Casey [name], Casey [name] NO.:  [id number]   MEDICAL RECORD NO.:  [id number]          PATIENT TYPE:  AMB   LOCATION:  DSC                          FACILITY:  [facility]   PHYSICIAN:  Thornton Park. Daphine Deutscher, MD  DATE OF BIRTH:  05-06-1956   DATE OF PROCEDURE:  08/09/2006  DATE OF DISCHARGE:                               OPERATIVE REPORT   PREOPERATIVE DIAGNOSIS:  Umbilical hernia.   POSTOPERATIVE DIAGNOSIS:  Umbilical hernia.   PROCEDURE:  Repair of the umbilical hernia with Ventralex 1.7 inch in  diameter mesh.   SURGEON:  Thornton Park. Daphine Deutscher, M.D.   ANESTHESIA:  General.   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  He received Cipro preop.  The abdomen was prepped  with Betadine and draped sterilely.  PAS hose were in place.  A  curvilinear incision was made in the infraumbilical region after  prepping with [employer].  I went ahead and took the skin of the  umbilicus off this obvious hernia and then freed the sac from the  surrounding tissue.  I freed the fascia from the sac, an area about an  inch around the edges of the fascial defect.  First, I tried a larger  Ventralex piece but it tended to bunch and was too big.  The next best  was to go to the 1.7 inch in diameter.  I installed that and it was  anchored in place with four horizontal mattress sutures placed through-  and-through the mesh under direct vision.  These all four were placed  and then tied and this basically obliterated the defect.  I then tacked  the polypropylene portion to the under surface of the fascia with #1  Vicryl.  A 4-0 Vicryl tacked the skin of the umbilicus down to the  fascia and then the other portion of the wound was closed with  interrupted subcuticular and subcutaneous 4-0 Vicryl with Benzoin and  Steri-Strips.  The patient seemed to tolerate the procedure well.  Dr.  Allegra Grana came in after that to do some nasal reconstruction.   FINAL  DIAGNOSIS:  Umbilical hernia status post repair with Ventralex.      Thornton Park Daphine Deutscher, MD  Electronically Signed     MBM/MEDQ  D:  08/09/2006  T:  08/09/2006  Job:  [id number]

## 2010-12-11 NOTE — Op Note (Signed)
NAME:  Casey [name], Casey [name] NO.:  [id number]   MEDICAL RECORD NO.:  [id number]          PATIENT TYPE:  AMB   LOCATION:  DAY                          FACILITY:  [facility]   PHYSICIAN:  Casey [name], M.D.DATE OF BIRTH:  1956-01-06   DATE OF PROCEDURE:  07/14/2004  DATE OF DISCHARGE:                                 OPERATIVE REPORT   PREOPERATIVE DIAGNOSIS:  Left ureteral lithiasis with high-grade  obstruction.   POSTOPERATIVE DIAGNOSIS:  Left ureteral lithiasis with high-grade  obstruction with urethral stricture disease.   OPERATIVE PROCEDURE:  Cystourethroscopy, dilatation of bulbar urethral  stricture, left right retrograde ureteropyelogram with placement of left  double-J stent.  Culture and sensitivity of left renal pelvic urine.   SURGEON:  Lucrezia Starch. Earlene Plater, M.D.   ANESTHESIA:  LMA.   ESTIMATED BLOOD LOSS:  Negligible.   TUBES:  A 28 cm 6 [place] Cook double pigtail stent.   COMPLICATIONS:  None.   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  He has a  history of uric acid kidney stones.  He is currently on Allopurinol.  CT  scan of the abdomen and pelvis without contrast revealed an approximately 7  mm left upper ureteral calculus just below the ureteropelvic junction, which  appeared to be a high-grade obstruction.  There are also multiple cysts in  both kidneys.  He has been hurting for a full day.  After understanding the  risks, benefits and alternatives, he has elected to proceed with cystoscopy,  left right retrograde ureteropyelogram, placement of left stent, and then  will undergo alkalinization with potassium citrate to try to dissolve it.  KUB on the CT scan revealed that it was not visible, which made it quite  suspicious for ureterolithiasis.  After understanding the risks, benefits  and alternatives, he has elected to proceed.   PROCEDURE IN  DETAIL:  Patient was placed in the supine position.  After  proper LMA anesthesia, he was placed in the dorsal lithotomy position,  prepped and draped with Betadine in a sterile fashion.  Cystourethroscopy  was performed with a 22.5 [employer] pan endoscope.  He was noted  to have a fibrinous deep bulbar urethral stricture that was quite narrow and  easily dilated with the scope.  He had mild trilobar hypertrophy.  Grade I  trabeculation was noted in the bladder, and there were no lesions noted in  the bladder.  Both ureteral orifices were identified.  Reflux of clear urine  was noted from the right ureteral orifice.  Under fluoroscopic guidance, a  0.038 French Teflon-coated guidewire was placed up to the stone, and the  stone was bypassed into the right renal pelvis.  A 6 French open-ended  catheter was placed into the left renal pelvis.  Dye was injected, and  hydronephrotic drip was noted.  Urine was collected, which was slightly  blood-tinged, for culture and sensitivity.  Dye was then injected.  There  was significant hydronephrosis, but no filling defects were noted.  The wire  was replaced.  The stent was  removed.  Under fluoroscopic guidance, a 28 cm 6 [place] Cook double pigtail  stent was placed in good position within the left renal pelvis and within  the bladder.  Again, a bloody hydronephrotic drip was noted.  The bladder  was drained.  The pan endoscope was removed.  Patient was taken to the  recovery room stable.     Rona   RLD/MEDQ  D:  07/14/2004  T:  07/14/2004  Job:  [id number]

## 2010-12-11 NOTE — Op Note (Signed)
NAME:  Casey [name], Casey [name]               ACCOUNT NO.:  [id number]   MEDICAL RECORD NO.:  [id number]          PATIENT TYPE:  AMB   LOCATION:  DSC                          FACILITY:  [facility]   PHYSICIAN:  Jefry H. Pollyann Kennedy, MD     DATE OF BIRTH:  Oct 23, 1955   DATE OF PROCEDURE:  08/09/2006  DATE OF DISCHARGE:                               OPERATIVE REPORT   PREOPERATIVE DIAGNOSES:  Nasal septal deviation with inferior turbinate  hypertrophy.   POSTOPERATIVE DIAGNOSES:  Nasal septal deviation with inferior turbinate  hypertrophy.   PROCEDURES:  Nasal septoplasty and submucous resection of inferior  turbinates bilaterally.   SURGEON:  Jefry H. Pollyann Kennedy, MD.   ANESTHESIA:  General endotracheal anesthesia was used.   COMPLICATIONS:  No complications.   BLOOD LOSS:  Minimal.   NOTE:  This procedure was done in conjunction with Dr. Wenda Low, who  also performed an umbilical hernia repair.  That will be dictated in a  separate report.   FINDINGS:  There was excessive length of the quadrilateral cartilage,  with a convexity towards the left.  There was a large spur posteriorly  on the left side, created by the junction of the vomer and the ethmoid  plate.  There was a right-ward deflection of the superior aspect of the  ethmoid plate with severe thickening of the bone.  There was also  spurring of the left maxillary crest in the left nasal cavity.  The  inferior turbinates were moderately enlarged, with thin, but elongated  bone.   HISTORY:  A 55 year old with a long history of nasal obstruction.  The  risks, benefits, alternatives and complications of the procedure were  explained to the patient, who seemed to understand and agreed to  surgery.   DESCRIPTION OF PROCEDURES:  The patient was previously orally intubated,  and following the general surgical procedure, the face was prepped and  draped in the standard fashion.  Afrin spray was used preoperatively.  Because of history of atrial  fibrillation, epinephrine was not used for  injection.  Simple 1% Xylocaine was used to inject the septum, the  columella and the inferior turbinates.  Topical Afrin was then used on  pledgets.   1. Nasal septoplasty:  A left hemitransfixion incision was created      using a 15 scalpel, and a mucoperichondrial flap was developed      posteriorly down the left side.  The bony-cartilaginous junction      was divided, and a similar flap was developed down the right side.      The superior aspect of the ethmoid plate was resected using a      fenestrated Jansen-Middleton rongeur.  The posterior and inferior      attachments were resected as well in a similar fashion, and large      fragments of ethmoid plate were resected.  The vomerian bone was      resected as well to relieve the posterior spurring.  A thin strip      of the posterior inferior quadrangular cartilage was thickened and  angulated to the left, and this was resected using a swivel knife.      And 2 mm of inferior quadrangular cartilage was resected to allow      for a relaxation of the convexity.  This worked nicely, without      having to perform morcellation or any other procedure on the      cartilage.  It was dissected out of the columellar pocket to allow      for midline positioning as well.  The maxillary crest was taken      down on the left side using a 4-mm osteotome.  These maneuvers      provided nice midline straight positioning of the septum.  The      mucosal incision was reapproximated with interrupted 4-0 chromic      suture.  The septal flaps were quilted with 4-0 plain gut.   Submucous resection of inferior turbinates:  The leading edge of the  inferior turbinates was incised with a 15 scalpel and a Cottle elevator  was used to elevate the mucosa off the bone.  Large fragments of thin  bone were resected using the Takahashi forceps.  The turbinate remnants  were then outfractured with a Therapist, nutritional.   The nasal cavities were  suctioned of blood and secretions and packed with rolled-up Telfa coated  with Bacitracin.  The pharynx was suctioned of blood and secretions  under direct visualization.  The patient was then awakened, extubated  and transferred to recovery in stable condition.      Jefry H. Pollyann Kennedy, MD  Electronically Signed     JHR/MEDQ  D:  08/09/2006  T:  08/09/2006  Job:  [id number]   cc:   [place]. Daphine Deutscher, MD

## 2010-12-16 ENCOUNTER — Other Ambulatory Visit: Payer: Self-pay | Admitting: Dermatology

## 2011-03-17 ENCOUNTER — Ambulatory Visit (HOSPITAL_COMMUNITY)
Admission: RE | Admit: 2011-03-17 | Discharge: 2011-03-17 | Disposition: A | Discharge status: Home / Self Care | Payer: Commercial Managed Care - PPO | Source: Ambulatory Visit | Attending: Cardiology | Admitting: Cardiology

## 2011-03-17 DIAGNOSIS — I4892 Unspecified atrial flutter: Secondary | ICD-10-CM | POA: Insufficient documentation

## 2011-03-17 DIAGNOSIS — Z01812 Encounter for preprocedural laboratory examination: Secondary | ICD-10-CM | POA: Insufficient documentation

## 2011-03-17 DIAGNOSIS — N419 Inflammatory disease of prostate, unspecified: Secondary | ICD-10-CM | POA: Insufficient documentation

## 2011-03-17 DIAGNOSIS — K219 Gastro-esophageal reflux disease without esophagitis: Secondary | ICD-10-CM | POA: Insufficient documentation

## 2011-03-17 DIAGNOSIS — Z7901 Long term (current) use of anticoagulants: Secondary | ICD-10-CM | POA: Insufficient documentation

## 2011-03-17 DIAGNOSIS — Z01810 Encounter for preprocedural cardiovascular examination: Secondary | ICD-10-CM | POA: Insufficient documentation

## 2011-03-17 DIAGNOSIS — I10 Essential (primary) hypertension: Secondary | ICD-10-CM | POA: Insufficient documentation

## 2011-03-17 DIAGNOSIS — N289 Disorder of kidney and ureter, unspecified: Secondary | ICD-10-CM | POA: Insufficient documentation

## 2011-03-17 LAB — BASIC METABOLIC PANEL
BUN: 25 mg/dL — ABNORMAL HIGH (ref 6–23)
Chloride: 105 mEq/L (ref 96–112)
Creatinine, Ser: 1.67 mg/dL — ABNORMAL HIGH (ref 0.50–1.35)
GFR calc Af Amer: 52 mL/min — ABNORMAL LOW (ref >60)
GFR calc non Af Amer: 43 mL/min — ABNORMAL LOW (ref >60)
Glucose, Bld: 112 mg/dL — ABNORMAL HIGH (ref 70–99)

## 2011-03-17 LAB — PROTIME-INR
INR: 2.39 — ABNORMAL HIGH (ref 0.00–1.49)
Prothrombin Time: 26.5 seconds — ABNORMAL HIGH (ref 11.6–15.2)

## 2011-03-17 LAB — CBC
MCH: 28.7 pg (ref 26.0–34.0)
MCHC: 33.9 g/dL (ref 30.0–36.0)
RDW: 15.4 % (ref 11.5–15.5)

## 2011-03-18 ENCOUNTER — Encounter: Payer: Self-pay | Admitting: Internal Medicine

## 2011-03-18 ENCOUNTER — Ambulatory Visit (INDEPENDENT_AMBULATORY_CARE_PROVIDER_SITE_OTHER): Payer: Commercial Managed Care - PPO | Admitting: Internal Medicine

## 2011-03-18 DIAGNOSIS — I4891 Unspecified atrial fibrillation: Secondary | ICD-10-CM | POA: Insufficient documentation

## 2011-03-18 DIAGNOSIS — I4892 Unspecified atrial flutter: Secondary | ICD-10-CM | POA: Insufficient documentation

## 2011-03-18 DIAGNOSIS — I10 Essential (primary) hypertension: Secondary | ICD-10-CM

## 2011-03-18 NOTE — Patient Instructions (Addendum)
Your physician has recommended that you have an ablation. Catheter ablation is a medical procedure used to treat some cardiac arrhythmias (irregular heartbeats). During catheter ablation, a long, thin, flexible tube is put into a blood vessel in your groin (upper thigh), or neck. This tube is called an ablation catheter. It is then guided to your heart through the blood vessel. Radio frequency waves destroy small areas of heart tissue where abnormal heartbeats may cause an arrhythmia to start. Please see the instruction sheet given to you today.  Offered 04/15/11 for ablation with TEE on 04/14/11

## 2011-03-18 NOTE — Assessment & Plan Note (Signed)
Stable No change required today  

## 2011-03-18 NOTE — Assessment & Plan Note (Signed)
As above.

## 2011-03-18 NOTE — Assessment & Plan Note (Signed)
The patient has symptomatic atrial fibrillation and typical appearing atrial flutter.  He has failed medical therapy with flecainide and cardizem.  Therapeutic strategies for afib and atrial flutter including medicine and ablation were discussed in detail with the patient today. Risk, benefits, and alternatives to EP study and radiofrequency ablation were also discussed in detail today. These risks include but are not limited to stroke, bleeding, vascular damage, tamponade, perforation, damage to the esophagus, lungs, and other structures, pulmonary vein stenosis, worsening renal function, and death. The patient understands these risk and wishes to proceed.  We will therefore proceed with catheter ablation for atrial flutter and atrial fibrillation at the next available time.

## 2011-03-18 NOTE — Progress Notes (Signed)
Casey [name] [facility] is a pleasant 55 y.o. WM patient with a h/o paroxysmal atrial fibrillation and typical appearing atrial flutter who presents today for EP consultation.  He reports initially being diagnosed with atrial fibrillation years ago.  He has episodes of atrial fibrillation once or twice per year. He was previously managed by Dr Amil Amen.  He has taken flecainide and diltiazem for years.  He reports increasing frequency and duration of atrial arrhythmias recently. He has required several prior cardioversions but states that afib occasional stops spontaneously after taking an additional dose of flecainide.  He is unaware of triggers or precipitants for his afib.  He has also had episodes of atrial flutter. Most recently, he developed abrupt tachypalpitations Tuesday.  He reports associated fatigue and a feeling of being "washed out".  He was evaluated by Dr Anne Fu and found to have typical appearing atrial flutter.  He underwent cardioversion and is now referred to my office for further evaluation.    Today, he denies symptoms of palpitations, chest pain, shortness of breath, orthopnea, PND, lower extremity edema, dizziness, presyncope, syncope, or neurologic sequela. The patient is tolerating medications without difficulties and is otherwise without complaint today.   Past Medical History  Diagnosis Date  . Atrial fibrillation     paroxysmal  . Atrial flutter   . Nephrolithiasis   . GERD (gastroesophageal reflux disease)   . Prostatitis   . Hypertension    Past Surgical History  Procedure Date  . Schwannoma removal     Current Outpatient Prescriptions  Medication Sig Dispense Refill  . allopurinol (ZYLOPRIM) 100 MG tablet Take 100 mg by mouth 2 (two) times daily.        Marland Kitchen diltiazem (DILACOR XR) 180 MG 24 hr capsule Take 180 mg by mouth daily.        . flecainide (TAMBOCOR) 150 MG tablet Take 150 mg by mouth 2 (two) times daily.        . metoprolol tartrate (LOPRESSOR) 25 MG tablet  Take 25 mg by mouth 2 (two) times daily.        Marland Kitchen omeprazole (PRILOSEC) 20 MG capsule Take 20 mg by mouth daily.        . potassium chloride (KLOR-CON) 10 MEQ CR tablet Take 10 mEq by mouth daily.        Marland Kitchen triamterene-hydrochlorothiazide (DYAZIDE) 37.5-25 MG per capsule Take 1 capsule by mouth every morning.        . warfarin (COUMADIN) 5 MG tablet Take 5 mg by mouth daily. 2.5mg   tab M, W, F, and 5mg  other days         Allergies  Allergen Reactions  . Sulfa Drugs Cross Reactors Rash    History   Social History  . Marital Status: Married    Spouse Name: N/A    Number of Children: N/A  . Years of Education: N/A   Occupational History  . Not on file.   Social History Main Topics  . Smoking status: Never Smoker   . Smokeless tobacco: Not on file  . Alcohol Use: No     occasional  . Drug Use: No  . Sexually Active: Not on file   Other Topics Concern  . 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    Family History  Problem Relation Age of Onset  . Atrial fibrillation      sister has had 2 afib ablations    ROS- All systems are reviewed and  negative except as per the HPI above  Physical Exam: Filed Vitals:   03/18/11 1649  BP: 124/82  Height: 6\' 1"  (1.854 m)  Weight: 251 lb 12.8 oz (114.216 kg)    GEN- The patient is well appearing, alert and oriented x 3 today.   Head- normocephalic, atraumatic Eyes-  Sclera clear, conjunctiva pink Ears- hearing intact Oropharynx- clear Neck- supple, no JVP Lymph- no cervical lymphadenopathy Lungs- Clear to ausculation bilaterally, normal work of breathing Heart- Regular rate and rhythm, no murmurs, rubs or gallops, PMI not laterally displaced GI- soft, NT, ND, + BS Extremities- no clubbing, cyanosis, or edema MS- no significant deformity or atrophy Skin- no rash or lesion Psych- euthymic mood, full affect Neuro- strength and sensation are intact  EKG from 03/17/11 reveals sinus rhythm with  first degree AV block, PR 320, IVCD (QRS 130), Qtc 498 Another EKG from 03/17/11 reveals typical appearing atrial flutter with variable conduction, IVCD, V rate 73 bpm  Assessment and Plan:

## 2011-03-18 NOTE — Op Note (Signed)
  NAME:  Casey [name], Casey [name] NO.:  [id number]  MEDICAL RECORD NO.:  [id number]  LOCATION:  [facility]                         FACILITY:  [facility]  PHYSICIAN:  Jake Bathe, MD      DATE OF BIRTH:  01/12/1956  DATE OF PROCEDURE:  03/22/2011 DATE OF DISCHARGE:                              OPERATIVE REPORT   PROCEDURE:  Outpatient direct current cardioversion.  INDICATIONS:  Atrial flutter.  BRIEF HISTORY:  He noted atrial flutter after playing tennis, took an extra dose of flecainide 150 without success for cardioversion.  It has been approximately 24 hours.  He is on chronic anticoagulation and is therapeutic.  After several phone interactions and the fact that he did not convert.  This morning, he was brought into short-stay for cardioversion.  PROCEDURE:  Detailed informed consent was obtained.  Risk of stroke, heart attack, death were explained to the patient at length.  Questions were answered.  Alternatives of treatments were discussed.  He has been through these procedures before.  After anesthesia administered propofol and adequate sedation was obtained, a single 150 joules biphasic shock was administered under synchronization.  This converted his atrial flutter to sinus bradycardia, heart rate of 54 which is his normal heart rate.  The original EKG upon close inspection shows an atrial flutter with a rate of approximately 200 beats per minute with variable conduction.  He has negative deflection in II, III, aVF and positive in V1 and V2.  Likely tricuspid isthmus dependent flutter.  I briefly had discussion with Dr. Graciela Husbands regarding this flutter and possible therapeutic options.  We also had a discussion about utilization of Coumadin and went through the CHADS2VAS scoring system.  He is being treated for hypertension only.  Following successful cardioversion, he recovered nicely.  Because of increasing frequency or frequent bouts of atrial flutter, I will  direct him to [facility] for possible ablation therapy.  We have discussed this.  He knows to contact me if any further worrisome symptoms develop.  I will see him back in followup in approximately 1 week.     Jake Bathe, MD     MCS/MEDQ  D:  03/17/2011  T:  03/17/2011  Job:  [id number]  cc:   Theressa Millard, M.D.  Electronically Signed by Donato Schultz MD on 03/18/2011 06:33:06 AM

## 2011-03-26 ENCOUNTER — Telehealth: Payer: Self-pay | Admitting: Internal Medicine

## 2011-03-26 NOTE — Telephone Encounter (Signed)
Calling to schedule procedure.

## 2011-04-06 NOTE — Telephone Encounter (Signed)
Procedure is scheduled for 05/27/2011  With TEE on 05/26/11  Let pt know I would be in touch with him later

## 2011-04-15 ENCOUNTER — Encounter: Payer: Self-pay | Admitting: *Deleted

## 2011-04-15 ENCOUNTER — Other Ambulatory Visit: Payer: Self-pay | Admitting: *Deleted

## 2011-04-15 DIAGNOSIS — I4891 Unspecified atrial fibrillation: Secondary | ICD-10-CM

## 2011-04-15 NOTE — Progress Notes (Signed)
Preop labs

## 2011-04-20 NOTE — Telephone Encounter (Signed)
  Tee at 11:00am on 05/26/11

## 2011-04-28 ENCOUNTER — Encounter: Payer: Self-pay | Admitting: Internal Medicine

## 2011-05-04 LAB — SAMPLE TO BLOOD BANK

## 2011-05-11 ENCOUNTER — Encounter: Payer: Self-pay | Admitting: Cardiology

## 2011-05-18 ENCOUNTER — Encounter: Payer: Self-pay | Admitting: Internal Medicine

## 2011-05-18 LAB — PROTIME-INR

## 2011-05-20 ENCOUNTER — Other Ambulatory Visit (INDEPENDENT_AMBULATORY_CARE_PROVIDER_SITE_OTHER): Payer: Commercial Managed Care - PPO | Admitting: *Deleted

## 2011-05-20 DIAGNOSIS — I4891 Unspecified atrial fibrillation: Secondary | ICD-10-CM

## 2011-05-20 LAB — BASIC METABOLIC PANEL
BUN: 23 mg/dL (ref 6–23)
GFR: 49.74 mL/min — ABNORMAL LOW (ref >60.00)
Glucose, Bld: 108 mg/dL — ABNORMAL HIGH (ref 70–99)
Potassium: 3.3 mEq/L — ABNORMAL LOW (ref 3.5–5.1)

## 2011-05-20 LAB — CBC WITH DIFFERENTIAL/PLATELET
Basophils Relative: 0.6 % (ref 0.0–3.0)
Eosinophils Relative: 2.1 % (ref 0.0–5.0)
HCT: 41.2 % (ref 39.0–52.0)
MCV: 88.5 fl (ref 78.0–100.0)
Monocytes Absolute: 0.4 10*3/uL (ref 0.1–1.0)
Neutrophils Relative %: 65.4 % (ref 43.0–77.0)
RBC: 4.66 Mil/uL (ref 4.22–5.81)
WBC: 6.8 10*3/uL (ref 4.5–10.5)

## 2011-05-26 ENCOUNTER — Ambulatory Visit (HOSPITAL_COMMUNITY)
Admission: RE | Admit: 2011-05-26 | Discharge: 2011-05-26 | Disposition: A | Discharge status: Home / Self Care | Payer: Commercial Managed Care - PPO | Source: Ambulatory Visit | Attending: Internal Medicine | Admitting: Internal Medicine

## 2011-05-26 DIAGNOSIS — I059 Rheumatic mitral valve disease, unspecified: Secondary | ICD-10-CM | POA: Insufficient documentation

## 2011-05-26 DIAGNOSIS — I4891 Unspecified atrial fibrillation: Secondary | ICD-10-CM

## 2011-05-26 DIAGNOSIS — I079 Rheumatic tricuspid valve disease, unspecified: Secondary | ICD-10-CM | POA: Insufficient documentation

## 2011-05-26 LAB — PROTIME-INR
INR: 2.69 — ABNORMAL HIGH (ref 0.00–1.49)
Prothrombin Time: 29 seconds — ABNORMAL HIGH (ref 11.6–15.2)

## 2011-05-27 ENCOUNTER — Ambulatory Visit (HOSPITAL_COMMUNITY)
Admission: RE | Admit: 2011-05-27 | Discharge: 2011-05-28 | Disposition: A | Discharge status: Home / Self Care | Payer: Commercial Managed Care - PPO | Source: Ambulatory Visit | Attending: Internal Medicine | Admitting: Internal Medicine

## 2011-05-27 DIAGNOSIS — J45909 Unspecified asthma, uncomplicated: Secondary | ICD-10-CM | POA: Insufficient documentation

## 2011-05-27 DIAGNOSIS — I129 Hypertensive chronic kidney disease with stage 1 through stage 4 chronic kidney disease, or unspecified chronic kidney disease: Secondary | ICD-10-CM | POA: Insufficient documentation

## 2011-05-27 DIAGNOSIS — N200 Calculus of kidney: Secondary | ICD-10-CM | POA: Insufficient documentation

## 2011-05-27 DIAGNOSIS — I4892 Unspecified atrial flutter: Secondary | ICD-10-CM | POA: Insufficient documentation

## 2011-05-27 DIAGNOSIS — K219 Gastro-esophageal reflux disease without esophagitis: Secondary | ICD-10-CM | POA: Insufficient documentation

## 2011-05-27 DIAGNOSIS — I700 Atherosclerosis of aorta: Secondary | ICD-10-CM | POA: Insufficient documentation

## 2011-05-27 DIAGNOSIS — I4891 Unspecified atrial fibrillation: Secondary | ICD-10-CM

## 2011-05-27 DIAGNOSIS — N189 Chronic kidney disease, unspecified: Secondary | ICD-10-CM | POA: Insufficient documentation

## 2011-05-27 DIAGNOSIS — N419 Inflammatory disease of prostate, unspecified: Secondary | ICD-10-CM | POA: Insufficient documentation

## 2011-05-27 LAB — PROTIME-INR
INR: 3.06 — ABNORMAL HIGH (ref 0.00–1.49)
Prothrombin Time: 32.1 seconds — ABNORMAL HIGH (ref 11.6–15.2)

## 2011-05-28 ENCOUNTER — Other Ambulatory Visit: Payer: Self-pay

## 2011-05-28 LAB — BASIC METABOLIC PANEL
CO2: 29 mEq/L (ref 19–32)
Chloride: 103 mEq/L (ref 96–112)
Potassium: 3.5 mEq/L (ref 3.5–5.1)
Sodium: 141 mEq/L (ref 135–145)

## 2011-05-28 LAB — PROTIME-INR: INR: 2.59 — ABNORMAL HIGH (ref 0.00–1.49)

## 2011-06-10 ENCOUNTER — Encounter: Payer: Self-pay | Admitting: Internal Medicine

## 2011-07-19 ENCOUNTER — Telehealth: Payer: Self-pay | Admitting: *Deleted

## 2011-07-19 ENCOUNTER — Other Ambulatory Visit: Payer: Self-pay | Admitting: *Deleted

## 2011-07-19 ENCOUNTER — Telehealth: Payer: Self-pay | Admitting: Internal Medicine

## 2011-07-19 ENCOUNTER — Encounter: Payer: Self-pay | Admitting: *Deleted

## 2011-07-19 DIAGNOSIS — I4891 Unspecified atrial fibrillation: Secondary | ICD-10-CM

## 2011-07-19 NOTE — Telephone Encounter (Signed)
Per Dr Allred--pt in at fib. Dr Johney Frame asking to schedule a cardioversion for pt for 07/21/11. Pt needs EKG and INR prior to cardioversion. I am waiting for a call back from anesthesia for an appt time for cardioversion for 07/21/11.

## 2011-07-19 NOTE — Telephone Encounter (Signed)
Cardioversion scheduled for 07/21/11 at 11am. Booking number [id number]. Pt to come to office a 8:30am for EKG and pro-time. Pt states his last INR was 2.9  about 3 weeks ago. He states he has been therapeutic for a year or so. Pt to hold diltiazem per Dr Gala Romney. LMTCB for pt

## 2011-07-19 NOTE — Telephone Encounter (Signed)
Patient called ,stated he just talked with Dr.Allred and he was okay.

## 2011-07-19 NOTE — Telephone Encounter (Signed)
New problem:  C/O still in Afib.

## 2011-07-19 NOTE — Telephone Encounter (Signed)
I talked with pt and he is aware to hold his diltiazem the night before the cardioversion.

## 2011-07-20 MED ORDER — SODIUM CHLORIDE 0.9 % IV SOLN
INTRAVENOUS | Status: DC
  Administered 2011-07-21: 10:00:00 via INTRAVENOUS

## 2011-07-21 ENCOUNTER — Other Ambulatory Visit: Payer: Self-pay

## 2011-07-21 ENCOUNTER — Ambulatory Visit (INDEPENDENT_AMBULATORY_CARE_PROVIDER_SITE_OTHER): Payer: Commercial Managed Care - PPO | Admitting: *Deleted

## 2011-07-21 ENCOUNTER — Encounter (HOSPITAL_COMMUNITY)
Admission: RE | Disposition: A | Discharge status: Home / Self Care | Payer: Self-pay | Source: Ambulatory Visit | Attending: Internal Medicine

## 2011-07-21 ENCOUNTER — Encounter (HOSPITAL_COMMUNITY): Payer: Self-pay | Admitting: Critical Care Medicine

## 2011-07-21 ENCOUNTER — Ambulatory Visit (INDEPENDENT_AMBULATORY_CARE_PROVIDER_SITE_OTHER): Payer: Commercial Managed Care - PPO

## 2011-07-21 ENCOUNTER — Inpatient Hospital Stay (HOSPITAL_COMMUNITY): Payer: Commercial Managed Care - PPO | Admitting: Critical Care Medicine

## 2011-07-21 ENCOUNTER — Ambulatory Visit (HOSPITAL_COMMUNITY)
Admission: RE | Admit: 2011-07-21 | Discharge: 2011-07-21 | Disposition: A | Discharge status: Home / Self Care | Payer: Commercial Managed Care - PPO | Source: Ambulatory Visit | Attending: Internal Medicine | Admitting: Internal Medicine

## 2011-07-21 ENCOUNTER — Encounter (HOSPITAL_COMMUNITY): Payer: Self-pay | Admitting: Internal Medicine

## 2011-07-21 DIAGNOSIS — I4892 Unspecified atrial flutter: Secondary | ICD-10-CM

## 2011-07-21 DIAGNOSIS — I4891 Unspecified atrial fibrillation: Secondary | ICD-10-CM

## 2011-07-21 LAB — BASIC METABOLIC PANEL
CO2: 28 mEq/L (ref 19–32)
Chloride: 106 mEq/L (ref 96–112)
GFR calc Af Amer: 59 mL/min — ABNORMAL LOW (ref >90)
Potassium: 3.5 mEq/L (ref 3.5–5.1)
Sodium: 144 mEq/L (ref 135–145)

## 2011-07-21 LAB — CBC
HCT: 41 % (ref 39.0–52.0)
MCV: 85.6 fL (ref 78.0–100.0)
RBC: 4.79 MIL/uL (ref 4.22–5.81)
WBC: 8.2 10*3/uL (ref 4.0–10.5)

## 2011-07-21 SURGERY — CARDIOVERSION
Anesthesia: Monitor Anesthesia Care | Wound class: Clean

## 2011-07-21 MED ORDER — SODIUM CHLORIDE 0.9 % IV SOLN
INTRAVENOUS | Status: DC | PRN
  Administered 2011-07-21: 12:00:00 via INTRAVENOUS

## 2011-07-21 MED ORDER — SODIUM CHLORIDE 0.9 % IJ SOLN: 3.0000 mL | Freq: Two times a day (BID) | INTRAMUSCULAR | Status: DC

## 2011-07-21 MED ORDER — SODIUM CHLORIDE 0.9 % IJ SOLN: 3.0000 mL | INTRAMUSCULAR | Status: DC | PRN

## 2011-07-21 MED ORDER — PROPOFOL 10 MG/ML IV EMUL
INTRAVENOUS | Status: DC | PRN
  Administered 2011-07-21: 50 mg via INTRAVENOUS
  Administered 2011-07-21: 100 mg via INTRAVENOUS

## 2011-07-21 MED ORDER — SODIUM CHLORIDE 0.9 % IV SOLN: 250.0000 mL | INTRAVENOUS | Status: DC

## 2011-07-21 MED ORDER — HYDROCORTISONE 1 % EX CREA
1.0000 "application " | TOPICAL_CREAM | Freq: Three times a day (TID) | CUTANEOUS | Status: DC | PRN
  Filled 2011-07-21: qty 28

## 2011-07-21 NOTE — Transfer of Care (Signed)
Immediate Anesthesia Transfer of Care Note  Patient: Casey [name] [facility]  Procedure(s) Performed:  CARDIOVERSION  Patient Location: Short Stay  Anesthesia Type: General  Level of Consciousness: awake, alert  and oriented  Airway & Oxygen Therapy: Patient Spontanous Breathing and Patient connected to nasal cannula oxygen  Post-op Assessment: Report given to PACU RN, Post -op Vital signs reviewed and stable and Patient moving all extremities X 4  Post vital signs: Reviewed and stable  Complications: No apparent anesthesia complications

## 2011-07-21 NOTE — Op Note (Signed)
     DIRECT CURRENT CARDIOVERSION  NAME:  Casey [name]   MRN: [id number] DOB:  15-Oct-1955   ADMIT DATE: 07/21/2011   INDICATIONS: Atrial fibrillation    PROCEDURE:   Informed consent was obtained prior to the procedure. The risks, benefits and alternatives for the procedure were discussed and the patient comprehended these risks. Once an appropriate time out was taken, the patient had the defibrillator pads placed in the anterior and posterior position. The patient then underwent sedation by the anesthesia service with 140mg  porpofol by Dr. Gypsy Balsam. Once an appropriate level of sedation was achieved, the patient received a single biphasic, synchronized 150J shock with prompt conversion to sinus rhythm. No apparent complications.   CONLCUSION:   1.  Successful DC-CV  Truman Hayward 11:57 AM

## 2011-07-21 NOTE — Anesthesia Postprocedure Evaluation (Signed)
  Anesthesia Post-op Note  Patient: Casey [name]  Procedure(s) Performed:  CARDIOVERSION  Patient Location: Short Stay  Anesthesia Type: General  Level of Consciousness: awake, alert  and oriented  Airway and Oxygen Therapy: Patient Spontanous Breathing and Patient connected to nasal cannula oxygen  Post-op Pain: none  Post-op Assessment: Post-op Vital signs reviewed, Patient's Cardiovascular Status Stable and Respiratory Function Stable  Post-op Vital Signs: Reviewed and stable  Complications: No apparent anesthesia complications

## 2011-07-21 NOTE — Anesthesia Postprocedure Evaluation (Signed)
  Anesthesia Post-op Note  Patient: Casey [name] [facility]  Procedure(s) Performed:  CARDIOVERSION  Patient Location: PACU and Short Stay  Anesthesia Type: General  Level of Consciousness: awake, alert  and oriented  Airway and Oxygen Therapy: Patient Spontanous Breathing  Post-op Pain: none  Post-op Assessment: Post-op Vital signs reviewed, Patient's Cardiovascular Status Stable, Respiratory Function Stable, Patent Airway, No signs of Nausea or vomiting, Adequate PO intake and Pain level controlled  Post-op Vital Signs: stable  Complications: No apparent anesthesia complications

## 2011-07-21 NOTE — Preoperative (Signed)
Beta Blockers   Reason not to administer Beta Blockers:Not Applicable 

## 2011-07-21 NOTE — H&P (Signed)
HPI:  Casey [name] is a pleasant 55 y.o. WM patient with a h/o paroxysmal atrial fibrillation and typical appearing atrial flutter who presents today for DC-CV.  Underwent AF ablation in November with Dr. Johney Frame. Since that time has had several recurrences which usually resolve with flecainide. However, this episode did not convert. D/w Dr. Johney Frame and was arranged for DC-CV today.  Today, he denies symptoms of palpitations, chest pain, shortness of breath, orthopnea, PND, lower extremity edema, dizziness, presyncope, syncope, or neurologic sequela. The patient is tolerating medications without difficulties and is otherwise without complaint today.  Past Medical History   Diagnosis  Date   .  Atrial fibrillation      paroxysmal   .  Atrial flutter    .  Nephrolithiasis    .  GERD (gastroesophageal reflux disease)    .  Prostatitis    .  Hypertension     Past Surgical History   Procedure  Date   .  Schwannoma removal     Current Outpatient Prescriptions   Medication  Sig  Dispense  Refill   .  allopurinol (ZYLOPRIM) 100 MG tablet  Take 100 mg by mouth 2 (two) times daily.     Marland Kitchen  diltiazem (DILACOR XR) 180 MG 24 hr capsule  Take 180 mg by mouth daily.     .  flecainide (TAMBOCOR) 150 MG tablet  Take 150 mg by mouth 2 (two) times daily.     .  metoprolol tartrate (LOPRESSOR) 25 MG tablet  Take 25 mg by mouth 2 (two) times daily.     Marland Kitchen  omeprazole (PRILOSEC) 20 MG capsule  Take 20 mg by mouth daily.     .  potassium chloride (KLOR-CON) 10 MEQ CR tablet  Take 10 mEq by mouth daily.     Marland Kitchen  triamterene-hydrochlorothiazide (DYAZIDE) 37.5-25 MG per capsule  Take 1 capsule by mouth every morning.     .  warfarin (COUMADIN) 5 MG tablet  Take 5 mg by mouth daily. 2.5mg  tab M, W, F, and 5mg  other days      Allergies   Allergen  Reactions   .  Sulfa Drugs Cross Reactors  Rash    History    Social History   .  Marital Status:  Married     Spouse Name:  N/A     Number of Children:  N/A    .  Years of Education:  N/A    Occupational History   .  Not on file.    Social History Main Topics   .  Smoking status:  Never Smoker   .  Smokeless tobacco:  Not on file   .  Alcohol Use:  No      occasional   .  Drug Use:  No   .  Sexually Active:  Not on file    Other Topics  Concern   .  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    Family History   Problem  Relation  Age of Onset   .  Atrial fibrillation        sister has had 2 afib ablations    ROS- All systems are reviewed and negative except as per the HPI above  Physical Exam:  Filed Vitals:    03/18/11 1649   BP:  124/82   Height:  6\' 1"  (1.854 m)   Weight:  251 lb 12.8 oz (  114.216 kg)    GEN- The patient is well appearing, alert and oriented x 3 today.  Head- normocephalic, atraumatic  Eyes- Sclera clear, conjunctiva pink  Ears- hearing intact  Oropharynx- clear  Neck- supple, no JVP  Lymph- no cervical lymphadenopathy  Lungs- Clear to ausculation bilaterally, normal work of breathing  Heart- Iregular rate and rhythm, no murmurs, rubs or gallops, PMI not laterally displaced  GI- soft, NT, ND, + BS  Extremities- no clubbing, cyanosis, or edema  MS- no significant deformity or atrophy  Skin- no rash or lesion  Psych- euthymic mood, full affect  Neuro- strength and sensation are intact   ECG: AFIB 73  ASSESSMENT & PLAN:  1) Recurrent AFIB s/p AF ablation. INRs therapeutic > 4 weeks. Today 3.4. Plan DC-CV today.

## 2011-07-21 NOTE — Progress Notes (Signed)
Pt presented for an ekg prior to cardioversion today at [facility]  EKG done and copy given to pt to handcarry to the hospital.

## 2011-07-21 NOTE — Interval H&P Note (Signed)
History and Physical Interval Note:  07/21/2011 11:52 AM  Casey [name]  has presented today for surgery, with the diagnosis of atrial fib  The various methods of treatment have been discussed with the patient and family. After consideration of risks, benefits and other options for treatment, the patient has consented to  Procedure(s): CARDIOVERSION as a surgical intervention .  The patients' history has been reviewed, patient examined, no change in status, stable for surgery.  I have reviewed the patients' chart and labs.  Questions were answered to the patient's satisfaction.     Casey [name]

## 2011-07-21 NOTE — Anesthesia Preprocedure Evaluation (Addendum)
Anesthesia Evaluation  Patient identified by MRN, date of birth, ID band Patient awake    Reviewed: Allergy & Precautions, H&P , NPO status , Patient's Chart, lab work & pertinent test results, reviewed documented beta blocker date and time   Airway Mallampati: II TM Distance: >3 FB Neck ROM: Full    Dental   Pulmonary    Pulmonary exam normal       Cardiovascular hypertension, Pt. on home beta blockers + dysrhythmias Atrial Fibrillation Irregular Normal    Neuro/Psych    GI/Hepatic GERD-  ,  Endo/Other    Renal/GU      Musculoskeletal   Abdominal   Peds  Hematology   Anesthesia Other Findings   Reproductive/Obstetrics                          Anesthesia Physical Anesthesia Plan  ASA: III  Anesthesia Plan: General   Post-op Pain Management:    Induction: Intravenous  Airway Management Planned: Mask  Additional Equipment:   Intra-op Plan:   Post-operative Plan:   Informed Consent: I have reviewed the patients History and Physical, chart, labs and discussed the procedure including the risks, benefits and alternatives for the proposed anesthesia with the patient or authorized representative who has indicated his/her understanding and acceptance.   Dental advisory given  Plan Discussed with: Anesthesiologist, CRNA and Surgeon  Anesthesia Plan Comments:        Anesthesia Quick Evaluation

## 2011-07-22 ENCOUNTER — Encounter (HOSPITAL_COMMUNITY): Payer: Self-pay | Admitting: Internal Medicine

## 2011-07-26 ENCOUNTER — Telehealth: Payer: Self-pay | Admitting: Physician Assistant

## 2011-07-26 ENCOUNTER — Other Ambulatory Visit: Payer: Self-pay | Admitting: *Deleted

## 2011-07-26 ENCOUNTER — Encounter: Payer: Self-pay | Admitting: *Deleted

## 2011-07-26 ENCOUNTER — Telehealth: Payer: Self-pay | Admitting: Internal Medicine

## 2011-07-26 ENCOUNTER — Other Ambulatory Visit: Payer: Commercial Managed Care - PPO | Admitting: *Deleted

## 2011-07-26 ENCOUNTER — Encounter (HOSPITAL_COMMUNITY): Payer: Self-pay | Admitting: Pharmacy Technician

## 2011-07-26 DIAGNOSIS — I4891 Unspecified atrial fibrillation: Secondary | ICD-10-CM

## 2011-07-26 LAB — BASIC METABOLIC PANEL
BUN: 21 mg/dL (ref 6–23)
CO2: 28 mEq/L (ref 19–32)
Chloride: 103 mEq/L (ref 96–112)
Creat: 1.43 mg/dL — ABNORMAL HIGH (ref 0.50–1.35)
Potassium: 3.5 mEq/L (ref 3.5–5.3)

## 2011-07-26 LAB — CBC WITH DIFFERENTIAL/PLATELET
Basophils Relative: 0 % (ref 0–1)
Eosinophils Absolute: 0.1 10*3/uL (ref 0.0–0.7)
Eosinophils Relative: 2 % (ref 0–5)
Hemoglobin: 14.5 g/dL (ref 13.0–17.0)
Lymphs Abs: 1.8 10*3/uL (ref 0.7–4.0)
MCH: 29.3 pg (ref 26.0–34.0)
MCHC: 33.6 g/dL (ref 30.0–36.0)
MCV: 87.3 fL (ref 78.0–100.0)
Monocytes Relative: 5 % (ref 3–12)
Neutrophils Relative %: 71 % (ref 43–77)
RBC: 4.95 MIL/uL (ref 4.22–5.81)

## 2011-07-26 LAB — PROTIME-INR: Prothrombin Time: 34.7 seconds — ABNORMAL HIGH (ref 11.6–15.2)

## 2011-07-26 NOTE — Telephone Encounter (Signed)
Dr Johney Frame spoke with patient  He is going to have a DCCV on Wed with Dr Jens Som  See instruction sheet  Will take last dose of Flecainide Tues 07/27/11 the pm dose.  Will need to be admitted of Friday for a Tikosyn load  This will be routed to Weston Brass, Pharm D to handle  Tikosyn twice daily

## 2011-07-26 NOTE — Telephone Encounter (Signed)
Follow-up:    Patient called again and would like a call back before 5pm.  Please advise.

## 2011-07-26 NOTE — Telephone Encounter (Signed)
New msg Pt said he is in afib again and wanted to talk to you  About what he can do

## 2011-07-26 NOTE — Telephone Encounter (Signed)
Dr Johney Frame to call patient

## 2011-07-26 NOTE — Telephone Encounter (Signed)
Stat labs from this evening called in at 8:30pm. Cr mildly elevated but has been in the past. INR supratherapeutic but appears to be managed by outside MD. Will forward this message to MD to ensure they are reviewed.

## 2011-07-28 ENCOUNTER — Ambulatory Visit (HOSPITAL_COMMUNITY): Payer: Commercial Managed Care - PPO | Admitting: Anesthesiology

## 2011-07-28 ENCOUNTER — Other Ambulatory Visit: Payer: Self-pay

## 2011-07-28 ENCOUNTER — Encounter (HOSPITAL_COMMUNITY): Payer: Self-pay | Admitting: Anesthesiology

## 2011-07-28 ENCOUNTER — Ambulatory Visit (HOSPITAL_COMMUNITY)
Admission: RE | Admit: 2011-07-28 | Discharge: 2011-07-28 | Disposition: A | Discharge status: Home / Self Care | Payer: Commercial Managed Care - PPO | Source: Ambulatory Visit | Attending: Cardiology | Admitting: Cardiology

## 2011-07-28 ENCOUNTER — Encounter (HOSPITAL_COMMUNITY)
Admission: RE | Disposition: A | Discharge status: Home / Self Care | Payer: Self-pay | Source: Ambulatory Visit | Attending: Cardiology

## 2011-07-28 DIAGNOSIS — I4891 Unspecified atrial fibrillation: Secondary | ICD-10-CM | POA: Insufficient documentation

## 2011-07-28 HISTORY — PX: CARDIOVERSION: SHX1299

## 2011-07-28 SURGERY — CARDIOVERSION
Anesthesia: Monitor Anesthesia Care | Wound class: Clean

## 2011-07-28 MED ORDER — PROPOFOL 10 MG/ML IV BOLUS
INTRAVENOUS | Status: DC | PRN
  Administered 2011-07-28: 120 mg via INTRAVENOUS

## 2011-07-28 MED ORDER — SODIUM CHLORIDE 0.9 % IJ SOLN: 3.0000 mL | Freq: Two times a day (BID) | INTRAMUSCULAR | Status: DC

## 2011-07-28 MED ORDER — SODIUM CHLORIDE 0.9 % IV SOLN: 250.0000 mL | INTRAVENOUS | Status: DC

## 2011-07-28 MED ORDER — SODIUM CHLORIDE 0.9 % IV SOLN: INTRAVENOUS | Status: DC

## 2011-07-28 MED ORDER — SODIUM CHLORIDE 0.9 % IV SOLN
INTRAVENOUS | Status: DC | PRN
  Administered 2011-07-28: 14:00:00 via INTRAVENOUS

## 2011-07-28 MED ORDER — SODIUM CHLORIDE 0.9 % IJ SOLN: 3.0000 mL | INTRAMUSCULAR | Status: DC | PRN

## 2011-07-28 MED ORDER — HYDROCORTISONE 1 % EX CREA
1.0000 "application " | TOPICAL_CREAM | Freq: Three times a day (TID) | CUTANEOUS | Status: DC | PRN
  Filled 2011-07-28: qty 28

## 2011-07-28 NOTE — Anesthesia Postprocedure Evaluation (Signed)
  Anesthesia Post-op Note  Patient: Casey [name] [facility]  Procedure(s) Performed:  CARDIOVERSION  Patient Location: PACU and Short Stay  Anesthesia Type: MAC  Level of Consciousness: awake  Airway and Oxygen Therapy: Patient Spontanous Breathing  Post-op Pain: mild  Post-op Assessment: Post-op Vital signs reviewed  Post-op Vital Signs: stable  Complications: No apparent anesthesia complications

## 2011-07-28 NOTE — Transfer of Care (Signed)
Immediate Anesthesia Transfer of Care Note  Patient: Casey [name] [facility]  Procedure(s) Performed:  CARDIOVERSION  Patient Location: Short Stay  Anesthesia Type: MAC  Level of Consciousness: awake, alert , oriented and patient cooperative  Airway & Oxygen Therapy: Patient Spontanous Breathing and Patient connected to nasal cannula oxygen  Post-op Assessment: Report given to PACU RN and Post -op Vital signs reviewed and stable  Post vital signs: Reviewed and stable  Complications: No apparent anesthesia complications

## 2011-07-28 NOTE — Preoperative (Signed)
Beta Blockers   Reason not to administer Beta Blockers:Not Applicable 

## 2011-07-28 NOTE — Procedures (Signed)
Electrical Cardioversion Procedure Note Casey [name] [id number] October 09, 1955  Procedure: Electrical Cardioversion Indications:  Atrial Fibrillation  Procedure Details Consent: Risks of procedure as well as the alternatives and risks of each were explained to the (patient/caregiver).  Consent for procedure obtained. Time Out: Verified patient identification, verified procedure, site/side was marked, verified correct patient position, special equipment/implants available, medications/allergies/relevent history reviewed, required imaging and test results available.  Performed  Patient placed on cardiac monitor, pulse oximetry, supplemental oxygen as necessary.  Sedation given: by anesthesia, 140 mg diprovan IV Pacer pads placed anterior and posterior chest.  Cardioverted 1 time(s).  Cardioverted at 120J.  Evaluation Findings: Post procedure EKG shows: NSR Complications: None Patient did tolerate procedure well. Continue all present medications including coumadin.  Casey [name] 07/28/2011, 1:39 PM

## 2011-07-28 NOTE — Anesthesia Preprocedure Evaluation (Addendum)
Anesthesia Evaluation  Patient identified by MRN, date of birth, ID band Patient awake    Reviewed: Allergy & Precautions, H&P , NPO status , Patient's Chart, lab work & pertinent test results, reviewed documented beta blocker date and time   Airway Mallampati: II TM Distance: >3 FB Neck ROM: Full    Dental  (+) Teeth Intact and Dental Advisory Given   Pulmonary  clear to auscultation        Cardiovascular Exercise Tolerance: Good hypertension, Pt. on medications and Pt. on home beta blockers + dysrhythmias Atrial Fibrillation Irregular Normal    Neuro/Psych    GI/Hepatic GERD-  Medicated and Controlled,  Endo/Other    Renal/GU  Bladder dysfunction      Musculoskeletal   Abdominal   Peds  Hematology   Anesthesia Other Findings   Reproductive/Obstetrics                           Anesthesia Physical Anesthesia Plan  ASA: II  Anesthesia Plan: MAC   Post-op Pain Management:    Induction: Intravenous  Airway Management Planned: Mask  Additional Equipment:   Intra-op Plan:   Post-operative Plan:   Informed Consent:   Dental advisory given  Plan Discussed with: Anesthesiologist  Anesthesia Plan Comments:         Anesthesia Quick Evaluation

## 2011-07-29 ENCOUNTER — Encounter (HOSPITAL_COMMUNITY): Payer: Self-pay | Admitting: Cardiology

## 2011-07-30 ENCOUNTER — Inpatient Hospital Stay (HOSPITAL_COMMUNITY)
Admission: AD | Admit: 2011-07-30 | Discharge: 2011-08-02 | DRG: 310 | Disposition: A | Discharge status: Home / Self Care | Payer: Commercial Managed Care - PPO | Source: Ambulatory Visit | Attending: Internal Medicine | Admitting: Internal Medicine

## 2011-07-30 ENCOUNTER — Encounter (HOSPITAL_COMMUNITY): Payer: Self-pay | Admitting: General Practice

## 2011-07-30 ENCOUNTER — Other Ambulatory Visit: Payer: Self-pay

## 2011-07-30 ENCOUNTER — Ambulatory Visit (INDEPENDENT_AMBULATORY_CARE_PROVIDER_SITE_OTHER): Payer: Commercial Managed Care - PPO

## 2011-07-30 VITALS — BP 120/78 | HR 60 | Ht 73.0 in | Wt 253.8 lb

## 2011-07-30 DIAGNOSIS — N189 Chronic kidney disease, unspecified: Secondary | ICD-10-CM | POA: Diagnosis present

## 2011-07-30 DIAGNOSIS — I4891 Unspecified atrial fibrillation: Secondary | ICD-10-CM

## 2011-07-30 DIAGNOSIS — Z7901 Long term (current) use of anticoagulants: Secondary | ICD-10-CM

## 2011-07-30 DIAGNOSIS — I129 Hypertensive chronic kidney disease with stage 1 through stage 4 chronic kidney disease, or unspecified chronic kidney disease: Secondary | ICD-10-CM | POA: Diagnosis present

## 2011-07-30 DIAGNOSIS — I10 Essential (primary) hypertension: Secondary | ICD-10-CM | POA: Diagnosis present

## 2011-07-30 DIAGNOSIS — I4892 Unspecified atrial flutter: Principal | ICD-10-CM | POA: Diagnosis present

## 2011-07-30 HISTORY — DX: Unspecified osteoarthritis, unspecified site: M19.90

## 2011-07-30 HISTORY — DX: Pneumonia, unspecified organism: J18.9

## 2011-07-30 HISTORY — DX: Disorder of kidney and ureter, unspecified: N28.9

## 2011-07-30 HISTORY — DX: Cyst of kidney, acquired: N28.1

## 2011-07-30 LAB — BASIC METABOLIC PANEL
BUN: 31 mg/dL — ABNORMAL HIGH (ref 6–23)
CO2: 26 mEq/L (ref 19–32)
Calcium: 9.7 mg/dL (ref 8.4–10.5)
Calcium: 9.4 mg/dL (ref 8.4–10.5)
GFR calc Af Amer: 59 mL/min — ABNORMAL LOW (ref >90)
GFR calc non Af Amer: 51 mL/min — ABNORMAL LOW (ref >90)
Glucose, Bld: 124 mg/dL — ABNORMAL HIGH (ref 70–99)
Sodium: 143 mEq/L (ref 135–145)
Sodium: 142 mEq/L (ref 135–145)

## 2011-07-30 LAB — MAGNESIUM: Magnesium: 1.8 mg/dL (ref 1.5–2.5)

## 2011-07-30 MED ORDER — SODIUM CHLORIDE 0.9 % IJ SOLN: 3.0000 mL | INTRAMUSCULAR | Status: DC | PRN

## 2011-07-30 MED ORDER — DILTIAZEM HCL ER 180 MG PO CP24
180.0000 mg | ORAL_CAPSULE | Freq: Every day | ORAL | Status: DC
  Administered 2011-07-31: 180 mg via ORAL
  Filled 2011-07-30 (×2): qty 1

## 2011-07-30 MED ORDER — DOFETILIDE 500 MCG PO CAPS
500.0000 ug | ORAL_CAPSULE | Freq: Two times a day (BID) | ORAL | Status: DC
  Administered 2011-07-30 – 2011-08-02 (×6): 500 ug via ORAL
  Filled 2011-07-30 (×7): qty 1

## 2011-07-30 MED ORDER — METOPROLOL TARTRATE 25 MG PO TABS
25.0000 mg | ORAL_TABLET | Freq: Two times a day (BID) | ORAL | Status: DC
  Administered 2011-07-30 – 2011-08-02 (×6): 25 mg via ORAL
  Filled 2011-07-30 (×7): qty 1

## 2011-07-30 MED ORDER — POTASSIUM CHLORIDE CRYS ER 20 MEQ PO TBCR
20.0000 meq | EXTENDED_RELEASE_TABLET | Freq: Two times a day (BID) | ORAL | Status: DC
  Administered 2011-07-30 – 2011-08-02 (×6): 20 meq via ORAL
  Filled 2011-07-30 (×7): qty 1

## 2011-07-30 MED ORDER — WARFARIN SODIUM 5 MG PO TABS
5.0000 mg | ORAL_TABLET | Freq: Every day | ORAL | Status: DC
  Administered 2011-07-30 – 2011-07-31 (×2): 5 mg via ORAL
  Filled 2011-07-30 (×3): qty 1

## 2011-07-30 MED ORDER — ALLOPURINOL 100 MG PO TABS
100.0000 mg | ORAL_TABLET | Freq: Two times a day (BID) | ORAL | Status: DC
  Administered 2011-07-30 – 2011-08-02 (×6): 100 mg via ORAL
  Filled 2011-07-30 (×7): qty 1

## 2011-07-30 MED ORDER — SODIUM CHLORIDE 0.9 % IJ SOLN
3.0000 mL | Freq: Two times a day (BID) | INTRAMUSCULAR | Status: DC
  Administered 2011-07-30 – 2011-08-02 (×6): 3 mL via INTRAVENOUS

## 2011-07-30 MED ORDER — SODIUM CHLORIDE 0.9 % IV SOLN: 250.0000 mL | INTRAVENOUS | Status: DC | PRN

## 2011-07-30 MED ORDER — SIMVASTATIN 20 MG PO TABS
20.0000 mg | ORAL_TABLET | Freq: Every day | ORAL | Status: DC
  Administered 2011-07-30 – 2011-07-31 (×2): 20 mg via ORAL
  Filled 2011-07-30 (×2): qty 1

## 2011-07-30 NOTE — Progress Notes (Signed)
MEDICATION RELATED CONSULT NOTE - INITIAL   Pharmacy Consult for Tikosyn Indication: A-Fib  Allergies  Allergen Reactions  . Codeine Other (See Comments)    Headache   . Nexium (Esomeprazole Magnesium) Other (See Comments)    Diarrhea   . Lisinopril Cough  . Sulfa Drugs Cross Reactors Rash    Patient Measurements:  Vital Signs: Temp: 97.9 F (36.6 C) (01/04 1658) BP: 145/86 mmHg (01/04 1658) Pulse Rate: 74  (01/04 1658) Intake/Output from previous day:   Intake/Output from this shift:    Labs:  Basename 07/30/11 0939  WBC --  HGB --  HCT --  PLT --  APTT --  CREATININE 1.50*  LABCREA --  CREATININE 1.50*  CREAT24HRUR --  MG 1.8  PHOS --  ALBUMIN --  PROT --  ALBUMIN --  AST --  ALT --  ALKPHOS --  BILITOT --  BILIDIR --  IBILI --   The CrCl is unknown because both a height and weight (above a minimum accepted value) are required for this calculation.    Medical History: Past Medical History  Diagnosis Date  . Campath-induced atrial fibrillation     paroxysmal  . Atrial flutter   . GERD (gastroesophageal reflux disease)   . Prostatitis   . Hypertension   . Phlebitis after infusion   . Angina     "chest pain after ablation; never any other time"  . Pneumonia 1990's  . Shortness of breath on exertion   . Arthritis   . Nephrolithiasis   . Renal insufficiency     "Cr 1.6"  . Kidney cysts     "multiple"      Assessment: A-Fib s/p recent ablation and DCCV x2 on 12/26 and 1/2.  Here now for Tikosyn initiation.  K+ was low 3.8 this am and was given KCL to take as well as stopping HCTZ.  His Mag was 1.8 but will not replace as will increase on own with HCTZ stopped.  Will recheck Mag in am and will recheck K+ now prior to Tikosyn dose given and with am labs.    INR 2.23 at goal 2-3 Home warfarin dose 2.5mg  MWF and 5mg  all other days.  MD ordered Warfarin 5mg  tonight as not to have INR fall < 2.  MD dosing Warfarin will follow with you.      Goal of Therapy:  INR 2-3 K+ > 4 Mag > 1.8  Plan:   Tikosyn q12 if all labs ok Marcelino Scot 07/30/2011,6:00 PM

## 2011-07-30 NOTE — Progress Notes (Signed)
Nesanel Aguila [facility] is a pleasant 56 y.o. WM patient with a h/o paroxysmal atrial fibrillation and typical appearing atrial flutter who presents today for evaluation for Tikosyn initiation.  He was first seen by Dr. Johney Frame in August.  It was decided to proceed with an ablation at that time.  He underwent an ablation on 11/1.  On 12/24, he called and reported to be back in atrial fibrillation and was cardioverted on 12/26.  Unfortunately he went back into atrial fibrillation on 12/31.  At that time it was decided to change antiarrhythmic therapies.  Dr. Johney Frame and patient discussed amiodarone versus Tikosyn.  Pt preferred Tikosyn due to side effects and his sister has had success with this medication.  He took his last dose of flecainide on 1/1 and had a DCCV on 1/2.    Prior to appt, reviewed pt's lab results from 12/31.  K was 3.5.  Had patient increase potassium to BID starting Wednesday night.  He has also discontinued his triamterene/HCTZ on Wednesday.  Will recheck BMET and Mg today with goal of K>4.0 and Mg>1.8.   Reviewed all other medications.  Pt is not taking any contraindicated medications or QTc prolongating meds.  Pt anticoagulated with warfarin.  Reviewed INRs that are available.  Pt states he has been therapeutic.  Will check INR today.    EKG reviewed from 1/2.  QTc was 488.  QRS was prolonged at 126.  Discussed with Dr. Graciela Husbands, DOD in office.   Since QTc was boarderline, a repeat EKG was done today.  QTc appropriate today at 430.   Current Outpatient Prescriptions  Medication Sig Dispense Refill  . allopurinol (ZYLOPRIM) 100 MG tablet Take 100 mg by mouth 2 (two) times daily.        Marland Kitchen atorvastatin (LIPITOR) 10 MG tablet Take 10 mg by mouth daily.        Marland Kitchen diltiazem (DILACOR XR) 180 MG 24 hr capsule Take 180 mg by mouth daily.        . metoprolol tartrate (LOPRESSOR) 25 MG tablet Take 25 mg by mouth 2 (two) times daily.        Marland Kitchen omeprazole (PRILOSEC) 20 MG capsule Take 20 mg by mouth  daily.        . potassium chloride SA (K-DUR,KLOR-CON) 20 MEQ tablet Take 20 mEq by mouth 2 (two) times daily.        Marland Kitchen warfarin (COUMADIN) 5 MG tablet Take 5 mg by mouth daily. 2.5mg   tab M, W, F, and 5mg  other days         Allergies  Allergen Reactions  . Codeine Other (See Comments)    Headache   . Nexium (Esomeprazole Magnesium) Other (See Comments)    Diarrhea   . Lisinopril Cough  . Sulfa Drugs Cross Reactors Rash

## 2011-07-30 NOTE — H&P (Signed)
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  He has had difficulty with early recurrence of afib post ablation (ERAF) and is still in the first 90 days post ablation.  He has required several cardioversions despite flecainide 150mg  BID.  He therefore presents today for initiation of Tikosyn.   Today, he denies symptoms of palpitations, chest pain, shortness of breath, orthopnea, PND, lower extremity edema, dizziness, presyncope, syncope, or neurologic sequela. The patient is tolerating medications without difficulties and is otherwise without complaint today.   Past Medical History   Diagnosis  Date   .  Atrial fibrillation      paroxysmal   .  Atrial flutter    .  Nephrolithiasis    .  GERD (gastroesophageal reflux disease)    .  Prostatitis    .  Hypertension     Past Surgical History   Procedure  Date   .  Schwannoma removal     Current Outpatient Prescriptions   Medication  Sig  Dispense  Refill   .  allopurinol (ZYLOPRIM) 100 MG tablet  Take 100 mg by mouth 2 (two) times daily.     Marland Kitchen  diltiazem (DILACOR XR) 180 MG 24 hr capsule  Take 180 mg by mouth daily.     .  flecainide (TAMBOCOR) 150 MG tablet  Take 150 mg by mouth 2 (two) times daily.     .  metoprolol tartrate (LOPRESSOR) 25 MG tablet  Take 25 mg by mouth 2 (two) times daily.     Marland Kitchen  omeprazole (PRILOSEC) 20 MG capsule  Take 20 mg by mouth daily.     .  potassium chloride (KLOR-CON) 10 MEQ CR tablet  Take 10 mEq by mouth daily.     Marland Kitchen  triamterene-hydrochlorothiazide (DYAZIDE) 37.5-25 MG per capsule  Take 1 capsule by mouth every morning.     .  warfarin (COUMADIN) 5 MG tablet  Take 5 mg by mouth daily. 2.5mg  tab M, W, F, and 5mg  other days      Allergies   Allergen  Reactions   .  Sulfa Drugs Cross Reactors  Rash    History    Social History   .  Marital Status:  Married    Spouse Name:  N/A     Number of Children:  N/A   .  Years of Education:  N/A    Occupational History   .  Not on file.    Social History Main Topics   .  Smoking status:  Never Smoker   .  Smokeless tobacco:  Not on file   .  Alcohol Use:  No      occasional   .  Drug Use:  No   .  Sexually Active:  Not on file    Other Topics  Concern   .  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    Family History   Problem  Relation  Age of Onset   .  Atrial fibrillation        sister has had 2 afib ablations   ROS- All systems are reviewed and negative except as per the HPI above   Physical Exam: Filed Vitals:   07/30/11 1658  BP: 145/86  Pulse: 74  Temp: 97.9 F (36.6 C)  Resp: 16  SpO2: 97%    GEN- The patient is well appearing, alert and oriented x 3 today.   Head- normocephalic, atraumatic Eyes-  Sclera clear, conjunctiva pink Ears- hearing intact Oropharynx- clear Neck- supple, no JVP Lymph- no cervical lymphadenopathy Lungs- Clear to ausculation bilaterally, normal work of breathing Heart- Regular rate and rhythm, no murmurs, rubs or gallops, PMI not laterally displaced GI- soft, NT, ND, + BS Extremities- no clubbing, cyanosis, or edema  ekg today reveal sinus rhythm Qtc 430  Assessment and Plan:  1. Afib- admitted for intiation of tikosyn He has been off of flecainide x 5 doses I have stopped HCTZ  2.  HTN- stable   Jeromey Kruer,MD 5:17 PM 07/30/2011

## 2011-07-30 NOTE — Assessment & Plan Note (Signed)
Reviewed pt's labs.  INR- 2.2,  potassium- 3.8, Mg- 1.8, SCr- 1.5.  INR and Mg at goal.  Potassium slightly lower than goal of 4.0 for start of Tikosyn.  Spoke with Joice Lofts, RN working with Dr. Johney Frame today.  Will plan on giving pt of potassium PO now then recheck BMET at hospital.  Pt aware of plan.  CrCl- 88 mL/min.  Will plan on starting Tikosyn BID once potassium is corrected.  Pt will require continuous monitoring of EKG in hospital for at least 5 doses of Tikosyn prior to discharge to decrease risk of prolonged QTc.

## 2011-07-31 ENCOUNTER — Other Ambulatory Visit: Payer: Self-pay

## 2011-07-31 DIAGNOSIS — I10 Essential (primary) hypertension: Secondary | ICD-10-CM

## 2011-07-31 DIAGNOSIS — I4891 Unspecified atrial fibrillation: Secondary | ICD-10-CM

## 2011-07-31 LAB — BASIC METABOLIC PANEL
BUN: 24 mg/dL — ABNORMAL HIGH (ref 6–23)
CO2: 28 mEq/L (ref 19–32)
Chloride: 109 mEq/L (ref 96–112)
Creatinine, Ser: 1.39 mg/dL — ABNORMAL HIGH (ref 0.50–1.35)
GFR calc Af Amer: 64 mL/min — ABNORMAL LOW (ref >90)
Glucose, Bld: 104 mg/dL — ABNORMAL HIGH (ref 70–99)
Potassium: 4 mEq/L (ref 3.5–5.1)

## 2011-07-31 MED ORDER — ROSUVASTATIN CALCIUM 5 MG PO TABS
5.0000 mg | ORAL_TABLET | Freq: Every day | ORAL | Status: DC
  Administered 2011-08-01 – 2011-08-02 (×2): 5 mg via ORAL
  Filled 2011-07-31 (×2): qty 1

## 2011-07-31 MED ORDER — PANTOPRAZOLE SODIUM 40 MG PO TBEC
40.0000 mg | DELAYED_RELEASE_TABLET | Freq: Every day | ORAL | Status: DC
  Administered 2011-08-01: 40 mg via ORAL
  Filled 2011-07-31: qty 1

## 2011-07-31 NOTE — Progress Notes (Signed)
SUBJECTIVE:  Pt with ERAF s/p afib ablation now admitted for tikosyn.  The patient is doing well today.  At this time, he denies chest pain, shortness of breath, or any new concerns.     Marland Kitchen allopurinol  100 mg Oral BID  . diltiazem  180 mg Oral Daily  . dofetilide  500 mcg Oral Q12H  . metoprolol tartrate  25 mg Oral BID  . potassium chloride SA  20 mEq Oral BID  . simvastatin  20 mg Oral Daily  . sodium chloride  3 mL Intravenous Q12H  . warfarin  5 mg Oral Daily      OBJECTIVE: Physical Exam: Filed Vitals:   07/30/11 1658 07/30/11 2100 07/30/11 2300 07/31/11 0605  BP: 145/86 152/90  142/86  Pulse: 74 66  59  Temp: 97.9 F (36.6 C) 98.8 F (37.1 C)  98.1 F (36.7 C)  Resp: 16 18  18   Height:   6\' 1"  (1.854 m)   Weight:   253 lb 12 oz (115.1 kg)   SpO2: 97% 98%  96%   Telemetry reveals sinus rhythm, no arrhythmias  GEN- The patient is well appearing sleeping but rouses Head- normocephalic, atraumatic Eyes-  Sclera clear, conjunctiva pink Ears- hearing intact Oropharynx- clear Neck- supple, no JVP Lymph- no cervical lymphadenopathy Lungs- Clear to ausculation bilaterally, normal work of breathing Heart- Regular rate and rhythm, no murmurs, rubs or gallops, PMI not laterally displaced GI- soft, NT, ND, + BS Extremities- no clubbing, cyanosis, or edema   LABS: Basic Metabolic Panel:  Basename 07/31/11 0545 07/30/11 1700 07/30/11 0939  NA 144 142 --  K 4.0 4.2 --  CL 109 107 --  CO2 28 26 --  GLUCOSE 104* 88 --  BUN 24* 27* --  CREATININE 1.39* 1.47* --  CALCIUM 9.0 9.4 --  MG 1.9 -- 1.8  PHOS -- -- --   EKG- sinus, QT 430   ASSESSMENT AND PLAN:  1. afib- continue tikosyn 500 mcg BID     Continue coumadin  2. Hypertension- slightly elevated off of hctz/triamperene     May add ARB (carries allergy to ACE inhibitor) if remains elevated  3. CRI- creatinine continues to improve off of diuretics    Hillis Range, MD 07/31/2011 7:36 AM

## 2011-07-31 NOTE — Progress Notes (Signed)
CARE MANAGEMENT NOTE 07/31/2011  Patient:  Casey [name], Casey [name]   Account Number:  [id number]  Date Initiated:  07/31/2011  Documentation initiated by:  [facility]  Subjective/Objective Assessment:   ERAF s/p afib ablation     Action/Plan:   Anticipated DC Date:  08/02/2011   Anticipated DC Plan:  HOME/SELF CARE      DC Planning Services  CM consult  Medication Assistance      Choice offered to / List presented to:             Status of service:  In process, will continue to follow Medicare Important Message given?   (If response is "NO", the following Medicare IM given date fields will be blank) Date Medicare IM given:   Date Additional Medicare IM given:    Discharge Disposition:    Per UR Regulation:    Comments:  07/31/2011 1230  MD will need to write a 7 day prescription to fill at [facility]. Pt will need to take prescription to take to his pharmacy so they can order medication.Spoke to pt and states he uses [facility] on [address] [phone]. [facility]ed [facility] on  and they do not fill [facility]. [facility] [address] [facility] [address] [facility] [employer]. Contacted pt's wife and they wanted to try [facility] on [address]. Contacted [facility] and they are not registered. [facility] on [address], [address][phone] can fill Rx. Isidoro Donning RN CCM Case Mgmt phone [phone]

## 2011-08-01 ENCOUNTER — Other Ambulatory Visit: Payer: Self-pay

## 2011-08-01 LAB — MAGNESIUM: Magnesium: 1.7 mg/dL (ref 1.5–2.5)

## 2011-08-01 LAB — BASIC METABOLIC PANEL
CO2: 27 mEq/L (ref 19–32)
Calcium: 9.2 mg/dL (ref 8.4–10.5)
Chloride: 108 mEq/L (ref 96–112)
Glucose, Bld: 99 mg/dL (ref 70–99)
Potassium: 3.6 mEq/L (ref 3.5–5.1)
Sodium: 144 mEq/L (ref 135–145)

## 2011-08-01 LAB — PROTIME-INR
INR: 3.29 — ABNORMAL HIGH (ref 0.00–1.49)
Prothrombin Time: 34 seconds — ABNORMAL HIGH (ref 11.6–15.2)

## 2011-08-01 MED ORDER — DILTIAZEM HCL ER 240 MG PO CP24
240.0000 mg | ORAL_CAPSULE | Freq: Every day | ORAL | Status: DC
  Administered 2011-08-01 – 2011-08-02 (×2): 240 mg via ORAL
  Filled 2011-08-01 (×2): qty 1

## 2011-08-01 MED ORDER — MAGNESIUM SULFATE 40 MG/ML IJ SOLN
2.0000 g | Freq: Once | INTRAMUSCULAR | Status: AC
  Administered 2011-08-01: 2 g via INTRAVENOUS
  Filled 2011-08-01: qty 50

## 2011-08-01 MED ORDER — WARFARIN SODIUM 2.5 MG PO TABS
2.5000 mg | ORAL_TABLET | Freq: Once | ORAL | Status: AC
  Administered 2011-08-01: 2.5 mg via ORAL
  Filled 2011-08-01: qty 1

## 2011-08-01 MED ORDER — MAGNESIUM SULFATE 50 % IJ SOLN: 2.0000 g | Freq: Once | INTRAMUSCULAR | Status: DC

## 2011-08-01 MED ORDER — ACETAMINOPHEN 325 MG PO TABS: 650.0000 mg | ORAL_TABLET | ORAL | Status: DC | PRN

## 2011-08-01 MED ORDER — PANTOPRAZOLE SODIUM 40 MG PO TBEC
40.0000 mg | DELAYED_RELEASE_TABLET | Freq: Every day | ORAL | Status: DC
Start: 2011-08-01 — End: 2011-08-02
  Administered 2011-08-01: 40 mg via ORAL
  Filled 2011-08-01: qty 1

## 2011-08-01 MED ORDER — POTASSIUM CHLORIDE CRYS ER 20 MEQ PO TBCR
40.0000 meq | EXTENDED_RELEASE_TABLET | Freq: Once | ORAL | Status: AC
  Administered 2011-08-01: 40 meq via ORAL
  Filled 2011-08-01: qty 2

## 2011-08-01 NOTE — Progress Notes (Signed)
   CARE MANAGEMENT NOTE 08/01/2011  Patient:  Casey [name], Casey [name]   Account Number:  [id number]  Date Initiated:  07/31/2011  Documentation initiated by:  [facility]  Subjective/Objective Assessment:   ERAF s/p afib ablation     Action/Plan:   Anticipated DC Date:  08/02/2011   Anticipated DC Plan:  HOME/SELF CARE      DC Planning Services  CM consult  Medication Assistance      Choice offered to / List presented to:             Status of service:  In process, will continue to follow Medicare Important Message given?   (If response is "NO", the following Medicare IM given date fields will be blank) Date Medicare IM given:   Date Additional Medicare IM given:    Discharge Disposition:    Per UR Regulation:    Comments:  08/01/2011 1145 Spoke to wife and she plans to use [facility] on [address]. Explained she will need to take prescription to pharmacy the day of d/c and pt will receive a 7 day supply from [facility] main pharmacy until his medication comes. Explained pharmacy has to order Tikosyn. Isidoro Donning RN CCM Case Mgmt phone [phone]  07/31/2011 1230 Spoke to pt and states [facility] uses [facility] on [address] [phone]. MD will need to write a 7 day prescription to fill at [facility]. Pt will need to take prescription to take to his pharmacy so they can order medication. [facility] [address] and they do not fill Tikosyn. [facility] [address] and [address] are [facility] for [employer]. Contacted pt's wife and they wanted  to try [facility] on [address]. [facility]ed [facility] and they are not [facility]. [facility] on [address], [address][phone]. Isidoro Donning RN CCM Case Mgmt phone [phone]

## 2011-08-01 NOTE — Progress Notes (Signed)
SUBJECTIVE:  Pt with ERAF s/p afib ablation now admitted for tikosyn.  The patient is doing well today.  He is back in afib.  Mild HA this am.  At this time, he denies chest pain, shortness of breath, or any new concerns.     Marland Kitchen allopurinol  100 mg Oral BID  . diltiazem  240 mg Oral Daily  . dofetilide  500 mcg Oral Q12H  . magnesium sulfate 1 - 4 g bolus IVPB  2 g Intravenous Once  . metoprolol tartrate  25 mg Oral BID  . pantoprazole  40 mg Oral Q1200  . potassium chloride SA  20 mEq Oral BID  . potassium chloride  40 mEq Oral Once  . rosuvastatin  5 mg Oral Daily  . sodium chloride  3 mL Intravenous Q12H  . warfarin  5 mg Oral Daily  . DISCONTD: diltiazem  180 mg Oral Daily  . DISCONTD: magnesium sulfate  2 g Intravenous Once  . DISCONTD: pantoprazole  40 mg Oral Q1200  . DISCONTD: simvastatin  20 mg Oral Daily      OBJECTIVE: Physical Exam: Filed Vitals:   07/31/11 1400 07/31/11 2147 07/31/11 2200 08/01/11 0600  BP: 136/84 163/81 163/81 157/96  Pulse: 63 63 63 106  Temp: 98.4 F (36.9 C)  99.4 F (37.4 C) 97.8 F (36.6 C)  TempSrc: Oral     Resp: 18  18 18   Height:      Weight:      SpO2: 97%  97% 95%   Telemetry reveals sinus rhythm, no arrhythmias  GEN- The patient is well appearing sleeping but rouses Head- normocephalic, atraumatic Eyes-  Sclera clear, conjunctiva pink Ears- hearing intact Oropharynx- clear Neck- supple, no JVP Lymph- no cervical lymphadenopathy Lungs- Clear to ausculation bilaterally, normal work of breathing Heart- irregular rate and rhythm, no murmurs, rubs or gallops, PMI not laterally displaced GI- soft, NT, ND, + BS Extremities- no clubbing, cyanosis, or edema   LABS: Basic Metabolic Panel:  Basename 08/01/11 0500 07/31/11 0545  NA 144 144  K 3.6 4.0  CL 108 109  CO2 27 28  GLUCOSE 99 104*  BUN 19 24*  CREATININE 1.15 1.39*  CALCIUM 9.2 9.0  MG 1.7 1.9  PHOS -- --   EKG- afib, QT stable   ASSESSMENT AND PLAN:    1. afib- continue tikosyn 500 mcg BID     Continue coumadin     Increase diltiazem for better rate control  2. Hypertension- slightly elevated off of hctz/triamperene     Increase diltiazem as above     May add ARB (carries allergy to ACE inhibitor) if remains elevated  3. CRI- creatinine continues to improve off of diuretics  4. Replete K and Mg   Hillis Range, MD 08/01/2011 9:32 AM

## 2011-08-01 NOTE — Progress Notes (Signed)
Patient converted to atrial fib. MD on call made aware.

## 2011-08-02 ENCOUNTER — Other Ambulatory Visit: Payer: Self-pay

## 2011-08-02 DIAGNOSIS — I4891 Unspecified atrial fibrillation: Secondary | ICD-10-CM

## 2011-08-02 LAB — BASIC METABOLIC PANEL
BUN: 23 mg/dL (ref 6–23)
Calcium: 9.1 mg/dL (ref 8.4–10.5)
GFR calc Af Amer: 66 mL/min — ABNORMAL LOW (ref >90)
GFR calc non Af Amer: 57 mL/min — ABNORMAL LOW (ref >90)
Potassium: 3.9 mEq/L (ref 3.5–5.1)

## 2011-08-02 MED ORDER — DILTIAZEM HCL ER 240 MG PO CP24: 240.0000 mg | ORAL_CAPSULE | Freq: Every day | ORAL | Status: DC

## 2011-08-02 MED ORDER — SODIUM CHLORIDE 0.9 % IV SOLN: 250.0000 mL | INTRAVENOUS | Status: DC

## 2011-08-02 MED ORDER — SODIUM CHLORIDE 0.9 % IJ SOLN: 3.0000 mL | INTRAMUSCULAR | Status: DC | PRN

## 2011-08-02 MED ORDER — HYDROCORTISONE 1 % EX CREA: 1.0000 "application " | TOPICAL_CREAM | Freq: Three times a day (TID) | CUTANEOUS | Status: DC | PRN

## 2011-08-02 MED ORDER — DOFETILIDE 500 MCG PO CAPS: 500.0000 ug | ORAL_CAPSULE | Freq: Two times a day (BID) | ORAL | Status: DC

## 2011-08-02 MED ORDER — SODIUM CHLORIDE 0.9 % IJ SOLN
3.0000 mL | Freq: Two times a day (BID) | INTRAMUSCULAR | Status: DC
  Administered 2011-08-02: 3 mL via INTRAVENOUS

## 2011-08-02 NOTE — Progress Notes (Addendum)
SUBJECTIVE:  Pt with ERAF s/p afib ablation now admitted for tikosyn.  The patient is doing well today.  He is back in afib. At this time, he denies chest pain, shortness of breath, or any new concerns.     Marland Kitchen allopurinol  100 mg Oral BID  . diltiazem  240 mg Oral Daily  . dofetilide  500 mcg Oral Q12H  . magnesium sulfate 1 - 4 g bolus IVPB  2 g Intravenous Once  . metoprolol tartrate  25 mg Oral BID  . pantoprazole  40 mg Oral Q1200  . potassium chloride SA  20 mEq Oral BID  . potassium chloride  40 mEq Oral Once  . rosuvastatin  5 mg Oral Daily  . sodium chloride  3 mL Intravenous Q12H  . sodium chloride  3 mL Intravenous Q12H  . warfarin  2.5 mg Oral ONCE-1800  . DISCONTD: diltiazem  180 mg Oral Daily  . DISCONTD: magnesium sulfate  2 g Intravenous Once  . DISCONTD: pantoprazole  40 mg Oral Q1200  . DISCONTD: warfarin  5 mg Oral Daily      . sodium chloride      OBJECTIVE: Physical Exam: Filed Vitals:   08/01/11 1400 08/01/11 2130 08/01/11 2206 08/02/11 0520  BP: 152/98 150/97 150/97 131/89  Pulse: 117 108 112 99  Temp: 98.4 F (36.9 C) 98.2 F (36.8 C)  98 F (36.7 C)  TempSrc: Oral     Resp: 20 14  18   Height:      Weight:      SpO2: 95% 97%  95%   Telemetry reveals afib with V rates 90s/ low 100s GEN- The patient is well appearing sleeping but rouses Head- normocephalic, atraumatic Eyes-  Sclera clear, conjunctiva pink Ears- hearing intact Oropharynx- clear Neck- supple, no JVP Lymph- no cervical lymphadenopathy Lungs- Clear to ausculation bilaterally, normal work of breathing Heart- irregular rate and rhythm, no murmurs, rubs or gallops, PMI not laterally displaced GI- soft, NT, ND, + BS Extremities- no clubbing, cyanosis, or edema   LABS: Basic Metabolic Panel:  Basename 08/02/11 0510 08/01/11 0500  NA 142 144  K 3.9 3.6  CL 108 108  CO2 26 27  GLUCOSE 99 99  BUN 23 19  CREATININE 1.35 1.15  CALCIUM 9.1 9.2  MG 2.0 1.7  PHOS -- --    EKG- afib, QT stable   ASSESSMENT AND PLAN:  1. afib- continue tikosyn 500 mcg BID     Continue coumadin     Cardizem 240mg  daily at discharge     WIll plan cardioversion this am  2. Hypertension- slightly elevated off of hctz/triamperene     Increase diltiazem as above     May add ARB (carries allergy to ACE inhibitor) if remains elevated  3. CRI- creatinine continues to improve off of diuretics  4. Replete K and Mg   Hillis Range, MD 08/02/2011 7:52 AM   Addendum  WIll plan to discharge home today after cardioversion Will need BMET, MG, and EKG in 1 week Follow-up with me in 4 weeks.

## 2011-08-02 NOTE — Procedures (Signed)
Patient sedated per anesthesia with 140 mg Propofol IV. With the pads in the AP position patient was cardioverted to SR with 150 J synchronized biphasic energy.  Procedure without complications.

## 2011-08-02 NOTE — Discharge Summary (Signed)
CARDIOLOGY DISCHARGE SUMMARY   Patient ID: Casey [name] MRN: [id number] DOB/AGE: 58/21/1957 58 y.o.  Admit date: 07/30/2011 Discharge date: 08/02/2011  Primary Discharge Diagnosis: Atrial fibrillation Secondary Discharge Diagnosis:  Patient Active Problem List  Diagnoses  .  atrial fibrillation  . Atrial flutter  . Hypertension   Hospital Course: Dr. [name]. is a 56 year old male with a history of atrial fibrillation. He is anticoagulated with Coumadin. He recently had a flutter ablation and is within 90 days of that procedure. He had been on flecainide but had several episodes of atrial fibrillation. He came to the hospital on 07/30/2011 for initiation of Tikosyn. Initially, his magnesium level was slightly low and this was supplemented. His potassium was slightly low and this required supplementation as well. He otherwise tolerated the Tikosyn well. His HCTZ was discontinued per the Tikosyn protocol.   During his hospital stay, he converted from sinus rhythm to atrial fibrillation. He was rate controlled with Cardizem and his dose was uptitrated from 180 mg to 240 mg. 08/02/2011, he had 5 doses of Tikosyn. A cardioversion was scheduled. Dr. Tenny Craw performed a direct current cardioversion which successfully converted him to sinus rhythm. Post procedure, once he is ambulating well, he is considered stable for discharge in improved condition.   Labs:  Lab Results  Component Value Date   WBC 8.0 07/26/2011   HGB 14.5 07/26/2011   HCT 43.2 07/26/2011   MCV 87.3 07/26/2011   PLT 195 07/26/2011    Lab 08/02/11 0510  NA 142  K 3.9  CL 108  CO2 26  BUN 23  CREATININE 1.35  CALCIUM 9.1  PROT --  BILITOT --  ALKPHOS --  ALT --  AST --  GLUCOSE 99   Mg 2.0   EKG: sinus bradycardia with a 1st degree block.   FOLLOW UP PLANS AND APPOINTMENTS Discharge Orders    Future Appointments: Provider: Department: Dept Phone: Center:   08/11/2011 10:45 AM Lorenda Cahill [facility]  [place] [phone] [facility]   08/11/2011 11:00 AM Leotis Shames Gilford Raid, RN [facility] [place] [phone] [facility]   08/11/2011 12:30 PM Raul Del, RN [facility] [phone] None   08/26/2011 1:45 PM Gardiner Rhyme, MD [facility] [place] [phone] [facility]   11/18/2011 4:30 PM Delight Ovens, MD [facility] [place] [phone] [facility]     Current Discharge Medication List    CONTINUE these medications which have CHANGED   Details  diltiazem (DILACOR XR) 240 MG 24 hr capsule Take 1 capsule (240 mg total) by mouth daily. Qty: 30 capsule, Refills: 11      CONTINUE these medications which have NOT CHANGED   Details  allopurinol (ZYLOPRIM) 100 MG tablet Take 100 mg by mouth 2 (two) times daily.      atorvastatin (LIPITOR) 10 MG tablet Take 10 mg by mouth daily.      metoprolol tartrate (LOPRESSOR) 25 MG tablet Take 25 mg by mouth 2 (two) times daily.      omeprazole (PRILOSEC) 20 MG capsule Take 20 mg by mouth daily.      potassium chloride SA (K-DUR,KLOR-CON) 20 MEQ tablet Take 20 mEq by mouth 2 (two) times daily.      warfarin (COUMADIN) 5 MG tablet Take 5 mg by mouth daily. 2.5mg   tab M, W, F, and 5mg  other days        Follow-up Information    Follow up with Hillis Range, MD. (January 31st at 1:45pm)    Contact information:  [address] [place] [address] [phone]    ECG and lab work Jan 16th.      BRING ALL MEDICATIONS WITH YOU TO FOLLOW UP APPOINTMENTS  Time spent with patient to include physician time: Signed: Theodore Demark 08/02/2011, 10:27 AM Co-Sign MD  I have seen, examined the patient, and reviewed the above discharge assessment and plan.   Co Sign: Hillis Range, MD 08/04/2011 10:39 AM

## 2011-08-06 ENCOUNTER — Other Ambulatory Visit: Payer: Self-pay | Admitting: Internal Medicine

## 2011-08-06 ENCOUNTER — Other Ambulatory Visit: Payer: Self-pay | Admitting: *Deleted

## 2011-08-06 MED ORDER — AMIODARONE HCL 200 MG PO TABS: ORAL_TABLET | ORAL | Status: DC

## 2011-08-06 MED ORDER — AMIODARONE HCL 200 MG PO TABS: 200.0000 mg | ORAL_TABLET | Freq: Every day | ORAL | Status: DC

## 2011-08-06 NOTE — Telephone Encounter (Signed)
I received a call from the patient this am that he has returned to afib and has not converted to sinus over the past 36 hours.  He says he would like to start amiodarone. He has had two recurrences of afib on tikosyn within 1 week requiring cardioversion and now in persistent afib.  He has failed flecainide and now tikosyn.  We need to try to achieve sinus rhythm and maintain sinus during the remainder of his healing phase s/p recent afib ablation. I had a long discussion with the patient by phone this am about risks, benefits, and alternatives of amiodarone.  We have discussed lung, liver, thyroid, occular toxicities.  He is aware that pulmonary toxicity has rarely but possibly causes death in the past.  He accepts these risks and wishes to proceed. I will therefore start amiodarone 400mg  BID x 2 weeks, the, 200mg  BID x 2 weeks, then 200mg  daily thereafter.  He will stop tikosyn today and start amiodarone on Monday.  We will consider cardioversion in 2 weeks if he remains in afib.  The importance of close INR checks to avoid supratherapeutic INR was also discussed with the patient today

## 2011-08-06 NOTE — Telephone Encounter (Signed)
Amiodarone scripts sent to [facility] on [address].

## 2011-08-08 NOTE — Telephone Encounter (Signed)
Took my daughter to see Dr [name] as a patient and checked his pulse he was normal.  I then called Dr Johney Frame and spoke with him and let him know.  He is going to continue Tikosyn for now and will not pick up the Amiodarone

## 2011-08-10 ENCOUNTER — Other Ambulatory Visit: Payer: Commercial Managed Care - PPO | Admitting: *Deleted

## 2011-08-11 ENCOUNTER — Ambulatory Visit (INDEPENDENT_AMBULATORY_CARE_PROVIDER_SITE_OTHER): Payer: Commercial Managed Care - PPO | Admitting: *Deleted

## 2011-08-11 ENCOUNTER — Ambulatory Visit (INDEPENDENT_AMBULATORY_CARE_PROVIDER_SITE_OTHER): Payer: Commercial Managed Care - PPO

## 2011-08-11 ENCOUNTER — Encounter: Payer: Commercial Managed Care - PPO | Admitting: *Deleted

## 2011-08-11 DIAGNOSIS — I4892 Unspecified atrial flutter: Secondary | ICD-10-CM

## 2011-08-11 DIAGNOSIS — I10 Essential (primary) hypertension: Secondary | ICD-10-CM

## 2011-08-11 DIAGNOSIS — I4891 Unspecified atrial fibrillation: Secondary | ICD-10-CM

## 2011-08-11 LAB — MAGNESIUM: Magnesium: 1.8 mg/dL (ref 1.5–2.5)

## 2011-08-11 LAB — BASIC METABOLIC PANEL
BUN: 25 mg/dL — ABNORMAL HIGH (ref 6–23)
Calcium: 9.2 mg/dL (ref 8.4–10.5)
GFR: 50.08 mL/min — ABNORMAL LOW (ref >60.00)
Glucose, Bld: 101 mg/dL — ABNORMAL HIGH (ref 70–99)
Sodium: 145 mEq/L (ref 135–145)

## 2011-08-11 LAB — PROTIME-INR: Prothrombin Time: 37.3 s — ABNORMAL HIGH (ref 10.2–12.4)

## 2011-08-12 ENCOUNTER — Other Ambulatory Visit: Payer: Self-pay | Admitting: *Deleted

## 2011-08-12 DIAGNOSIS — I10 Essential (primary) hypertension: Secondary | ICD-10-CM

## 2011-08-12 DIAGNOSIS — I4891 Unspecified atrial fibrillation: Secondary | ICD-10-CM

## 2011-08-23 ENCOUNTER — Other Ambulatory Visit: Payer: Commercial Managed Care - PPO | Admitting: *Deleted

## 2011-08-24 ENCOUNTER — Other Ambulatory Visit (INDEPENDENT_AMBULATORY_CARE_PROVIDER_SITE_OTHER): Payer: Commercial Managed Care - PPO | Admitting: *Deleted

## 2011-08-24 DIAGNOSIS — I10 Essential (primary) hypertension: Secondary | ICD-10-CM

## 2011-08-24 DIAGNOSIS — I4891 Unspecified atrial fibrillation: Secondary | ICD-10-CM

## 2011-08-24 LAB — BASIC METABOLIC PANEL
BUN: 19 mg/dL (ref 6–23)
Calcium: 8.8 mg/dL (ref 8.4–10.5)
GFR: 59.37 mL/min — ABNORMAL LOW (ref >60.00)
Glucose, Bld: 96 mg/dL (ref 70–99)

## 2011-08-26 ENCOUNTER — Encounter: Payer: Self-pay | Admitting: Internal Medicine

## 2011-08-26 ENCOUNTER — Ambulatory Visit (INDEPENDENT_AMBULATORY_CARE_PROVIDER_SITE_OTHER): Payer: Commercial Managed Care - PPO | Admitting: Internal Medicine

## 2011-08-26 VITALS — BP 149/84 | HR 67 | Ht 73.0 in | Wt 252.0 lb

## 2011-08-26 DIAGNOSIS — I4891 Unspecified atrial fibrillation: Secondary | ICD-10-CM

## 2011-08-26 DIAGNOSIS — I10 Essential (primary) hypertension: Secondary | ICD-10-CM

## 2011-08-26 NOTE — Assessment & Plan Note (Signed)
Above goal Given hypokalemia, will check plasma renin/ aldosterone ratio  Consider adding an ARB if BP remains elevated after above labs are drawn.   Return in 2 months, consider repeat echo upon return.

## 2011-08-26 NOTE — Progress Notes (Signed)
EKG reviewed by Dr Johney Frame

## 2011-08-26 NOTE — Progress Notes (Signed)
PCP:  Darnelle Bos, MD, MD  The patient presents today for routine electrophysiology followup.  Since last being seen in our clinic, the patient reports doing reasonably well.  He has had significant ERAF s/p afib ablation and has required several cardioversions.  His afib is improving with tikosyn.  Today, he denies symptoms of chest pain, shortness of breath, or other concerns.  The patient feels that he is tolerating medications without difficulties and is otherwise without complaint today.   Past Medical History  Diagnosis Date  . Atrial fibrillation     paroxysmal  . Atrial flutter   . GERD (gastroesophageal reflux disease)   . Prostatitis   . Hypertension   . Phlebitis after infusion   . Angina     "chest pain after ablation; never any other time"  . Pneumonia 1990's  . Shortness of breath on exertion   . Arthritis   . Nephrolithiasis   . Renal insufficiency     "Cr 1.6"  . Kidney cysts     "multiple"  . Atrial enlargement, bilateral    Past Surgical History  Procedure Date  . Schwannoma removal ~01/2010    left thorax  . Cardioversion 07/28/2011    Procedure: CARDIOVERSION;  Surgeon: Lewayne Bunting, MD;  Location: [facility] OR;  Service: Cardiovascular;  Laterality: N/A;  . Cardioversion 07/28/2011    Procedure: CARDIOVERSION;  Surgeon: Lewayne Bunting, MD;  Location: [facility] OR;  Service: Cardiovascular;  Laterality: N/A;  . Inguinal hernia repair     "as a child; ? right"  . Lung biopsy 1993  . Double j stent placement 06/2004    left  . Cardioversion 04/2004; 05/2004  . Umbilical hernia repair 07/2006  . Nasal septoplasty w/ turbinoplasty 07/2006  . Prostate biopsy 2011    Current Outpatient Prescriptions  Medication Sig Dispense Refill  . allopurinol (ZYLOPRIM) 100 MG tablet Take 100 mg by mouth 2 (two) times daily.        Marland Kitchen atorvastatin (LIPITOR) 10 MG tablet Take 10 mg by mouth daily.        Marland Kitchen diltiazem (DILACOR XR) 240 MG 24 hr capsule Take 1 capsule (240 mg  total) by mouth daily.  30 capsule  11  . dofetilide (TIKOSYN) 500 MCG capsule Take 1 capsule (500 mcg total) by mouth 2 (two) times daily.      . metoprolol tartrate (LOPRESSOR) 25 MG tablet Take 25 mg by mouth 2 (two) times daily.        Marland Kitchen omeprazole (PRILOSEC) 20 MG capsule Take 20 mg by mouth daily.        . potassium chloride SA (K-DUR,KLOR-CON) 20 MEQ tablet Take 20 mEq by mouth 2 (two) times daily.        Marland Kitchen warfarin (COUMADIN) 5 MG tablet Take 5 mg by mouth daily. 2.5mg   tab M, W, F, and 5mg  other days         Allergies  Allergen Reactions  . Codeine Other (See Comments)    Headache   . Nexium (Esomeprazole Magnesium) Other (See Comments)    Diarrhea   . Lisinopril Cough  . Sulfa Drugs Cross Reactors Rash    History   Social History  . Marital Status: Married    Spouse Name: N/A    Number of Children: N/A  . Years of Education: N/A   Occupational History  . Not on file.   Social History Main Topics  . Smoking status: Never Smoker   . Smokeless tobacco: Never  Used  . Alcohol Use: 1.2 oz/week    2 Shots of liquor per week     occasional  . Drug Use: No  . Sexually Active: Yes   Other Topics Concern  . 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    Family History  Problem Relation Age of Onset  . Atrial fibrillation      sister has had 2 afib ablations   Physical Exam: Filed Vitals:   08/26/11 1343  BP: 149/84  Pulse: 67  Height: 6\' 1"  (1.854 m)  Weight: 252 lb (114.306 kg)    GEN- The patient is well appearing, alert and oriented x 3 today.   Head- normocephalic, atraumatic Eyes-  Sclera clear, conjunctiva pink Ears- hearing intact Oropharynx- clear Neck- supple, no JVP Lymph- no cervical lymphadenopathy Lungs- Clear to ausculation bilaterally, normal work of breathing Heart- Regular rate and rhythm, no murmurs, rubs or gallops, PMI not laterally displaced GI- soft, NT, ND, + BS Extremities- no clubbing, cyanosis,  or edema  ekg today reveals sinus rhythm 67 bpm, PR 232, Qtc 479, LAD  Assessment and Plan:

## 2011-08-26 NOTE — Patient Instructions (Signed)
Your physician recommends that you return for lab work on Monday 08/29/2101.  BMET, Plasma renin, Plasma aldosterone.  Your physician recommends that you schedule a follow-up appointment in:  2 Months.

## 2011-08-26 NOTE — Assessment & Plan Note (Signed)
He has had some ERAF s/p afib ablation Continue tikosyn at this time Continue coumadin

## 2011-08-30 ENCOUNTER — Other Ambulatory Visit: Payer: Commercial Managed Care - PPO | Admitting: *Deleted

## 2011-08-30 DIAGNOSIS — I4891 Unspecified atrial fibrillation: Secondary | ICD-10-CM

## 2011-08-30 LAB — BASIC METABOLIC PANEL
Calcium: 9 mg/dL (ref 8.4–10.5)
Creatinine, Ser: 1.3 mg/dL (ref 0.4–1.5)
GFR: 62.64 mL/min (ref >60.00)

## 2011-08-30 LAB — RENIN

## 2011-09-01 LAB — ALDOSTERONE: Aldosterone, Serum: 16 ng/dL

## 2011-09-02 ENCOUNTER — Ambulatory Visit: Payer: Commercial Managed Care - PPO | Admitting: *Deleted

## 2011-09-15 ENCOUNTER — Telehealth: Payer: Self-pay | Admitting: *Deleted

## 2011-09-15 NOTE — Telephone Encounter (Signed)
Message copied by Deliah Boston on Wed Sep 15, 2011  5:08 PM ------      Message from: Hillis Range      Created: Tue Sep 14, 2011  1:00 PM       Results reviewed.  I have spoke with the patient about his abnormal lab and also discussed with Dr Debara Pickett (Endocrine).      Please place a referral to Dr Sharl Ma for evaluation of hyperaldosteronism, hypokalemia, and hypertension.

## 2011-09-17 ENCOUNTER — Other Ambulatory Visit: Payer: Self-pay | Admitting: *Deleted

## 2011-09-17 DIAGNOSIS — I4891 Unspecified atrial fibrillation: Secondary | ICD-10-CM

## 2011-09-20 ENCOUNTER — Other Ambulatory Visit: Payer: Commercial Managed Care - PPO

## 2011-09-23 ENCOUNTER — Encounter: Payer: Self-pay | Admitting: Internal Medicine

## 2011-10-15 ENCOUNTER — Other Ambulatory Visit: Payer: Self-pay | Admitting: Internal Medicine

## 2011-10-15 DIAGNOSIS — E269 Hyperaldosteronism, unspecified: Secondary | ICD-10-CM

## 2011-10-20 ENCOUNTER — Ambulatory Visit
Admission: RE | Admit: 2011-10-20 | Discharge: 2011-10-20 | Disposition: A | Discharge status: Home / Self Care | Payer: Commercial Managed Care - PPO | Source: Ambulatory Visit | Attending: Internal Medicine | Admitting: Internal Medicine

## 2011-10-20 DIAGNOSIS — E269 Hyperaldosteronism, unspecified: Secondary | ICD-10-CM

## 2011-10-20 MED ORDER — IOHEXOL 300 MG/ML  SOLN
100.0000 mL | Freq: Once | INTRAMUSCULAR | Status: AC | PRN
  Administered 2011-10-20: 100 mL via INTRAVENOUS

## 2011-10-21 ENCOUNTER — Ambulatory Visit: Payer: Commercial Managed Care - PPO | Admitting: Internal Medicine

## 2011-11-01 ENCOUNTER — Ambulatory Visit (INDEPENDENT_AMBULATORY_CARE_PROVIDER_SITE_OTHER): Payer: Commercial Managed Care - PPO | Admitting: Internal Medicine

## 2011-11-01 ENCOUNTER — Encounter: Payer: Self-pay | Admitting: Internal Medicine

## 2011-11-01 VITALS — BP 125/82 | HR 72 | Resp 18 | Ht 72.0 in | Wt 243.4 lb

## 2011-11-01 DIAGNOSIS — I4891 Unspecified atrial fibrillation: Secondary | ICD-10-CM

## 2011-11-01 DIAGNOSIS — I10 Essential (primary) hypertension: Secondary | ICD-10-CM

## 2011-11-01 DIAGNOSIS — I4892 Unspecified atrial flutter: Secondary | ICD-10-CM

## 2011-11-01 NOTE — Assessment & Plan Note (Addendum)
Appears to be doing very well with tikosyn.   Continue coumadin. No changes today I have suggested that he consider the AliveCor device for the Iphone which allows for single lead heart monitoring to better determine when he is in afib  Repeat echo upon return

## 2011-11-01 NOTE — Progress Notes (Signed)
PCP:  Darnelle Bos, MD, MD Primary Cardiologist:  Dr Anne Fu  The patient presents today for routine electrophysiology followup.  Since last being seen in our clinic, the patient reports doing very well.  His afib appears to be well controlled with tikosyn presently.  He has rare palpitations and fatigue.  Today he suspects that he may be in afib, however ekg confirms sinus rhythm.  Today, he denies symptoms of chest pain, shortness of breath,lower extremity edema, dizziness, presyncope, syncope, or neurologic sequela.  The patient feels that he is tolerating medications without difficulties and is otherwise without complaint today.   He has an ongoing workup for hyperaldosteronism as directed by Dr Sharl Ma.  Adrenal venous sampling is being considered.  Past Medical History  Diagnosis Date  . Atrial fibrillation     paroxysmal  . Atrial flutter   . GERD (gastroesophageal reflux disease)   . Prostatitis   . Hypertension   . Phlebitis after infusion   . Angina     "chest pain after ablation; never any other time"  . Pneumonia 1990's  . Shortness of breath on exertion   . Arthritis   . Nephrolithiasis   . Renal insufficiency     "Cr 1.6"  . Kidney cysts     "multiple"  . Atrial enlargement, bilateral    Past Surgical History  Procedure Date  . Schwannoma removal ~01/2010    left thorax  . Cardioversion 07/28/2011    Procedure: CARDIOVERSION;  Surgeon: Lewayne Bunting, MD;  Location: [facility] OR;  Service: Cardiovascular;  Laterality: N/A;  . Inguinal hernia repair     "as a child; ? right"  . Lung biopsy 1993  . Double j stent placement 06/2004    left  . Umbilical hernia repair 07/2006  . Nasal septoplasty w/ turbinoplasty 07/2006  . Prostate biopsy 2011  . Atrial fibrillation ablation with cti ablation 05/27/11    ablation for afib and atrial flutter by Dr Johney Frame    Current Outpatient Prescriptions  Medication Sig Dispense Refill  . allopurinol (ZYLOPRIM) 100 MG tablet Take  100 mg by mouth 2 (two) times daily.        Marland Kitchen atorvastatin (LIPITOR) 10 MG tablet Take 10 mg by mouth daily.        Marland Kitchen diltiazem (DILACOR XR) 240 MG 24 hr capsule Take 1 capsule (240 mg total) by mouth daily.  30 capsule  11  . dofetilide (TIKOSYN) 500 MCG capsule Take 1 capsule (500 mcg total) by mouth 2 (two) times daily.      . metoprolol tartrate (LOPRESSOR) 25 MG tablet Take 25 mg by mouth 2 (two) times daily.        Marland Kitchen omeprazole (PRILOSEC) 20 MG capsule Take 20 mg by mouth daily.        . potassium chloride SA (K-DUR,KLOR-CON) 20 MEQ tablet Take 20 mEq by mouth 2 (two) times daily.        Marland Kitchen warfarin (COUMADIN) 5 MG tablet Take 5 mg by mouth daily. 2.5mg   tab M, W, F, and 5mg  other days         Allergies  Allergen Reactions  . Codeine Other (See Comments)    Headache   . Nexium (Esomeprazole Magnesium) Other (See Comments)    Diarrhea   . Lisinopril Cough  . Sulfa Drugs Cross Reactors Rash    History   Social History  . Marital Status: Married    Spouse Name: N/A    Number of Children: N/A  .  Years of Education: N/A   Occupational History  . Not on file.   Social History Main Topics  . Smoking status: Never Smoker   . Smokeless tobacco: Never Used  . Alcohol Use: 1.2 oz/week    2 Shots of liquor per week     occasional  . Drug Use: No  . Sexually Active: Yes   Other Topics Concern  . 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    Family History  Problem Relation Age of Onset  . Atrial fibrillation      sister has had 2 afib ablations    Physical Exam: Filed Vitals:   11/01/11 1143  BP: 125/82  Pulse: 72  Resp: 18  Height: 6' (1.829 m)  Weight: 243 lb 6.4 oz (110.406 kg)    GEN- The patient is well appearing, alert and oriented x 3 today.   Head- normocephalic, atraumatic Eyes-  Sclera clear, conjunctiva pink Ears- hearing intact Oropharynx- clear Neck- supple, no JVP Lymph- no cervical lymphadenopathy Lungs- Clear  to ausculation bilaterally, normal work of breathing Heart- Regular rate and rhythm, no murmurs, rubs or gallops, PMI not laterally displaced GI- soft, NT, ND, + BS Extremities- no clubbing, cyanosis, or edema  ekg today reveals sinus rhythm 72 bpm, PR 226, Qtc 481 poor R wave progression, inferior infarction pattern (stable from prior ekg)  Assessment and Plan:

## 2011-11-01 NOTE — Assessment & Plan Note (Signed)
Stable He is being evaluated for elevated aldosterone levels by Dr Sharl Ma.  He anticipates having adrenal venous sampling soon.  He will be able to hold coumadin for 5 days prior to this procedure and then restart postoperatively.

## 2011-11-01 NOTE — Patient Instructions (Signed)
Your physician recommends that you schedule a follow-up appointment in: 3 months with Dr Jone Baseman prior to appointment sameday if possible  Your physician has requested that you have an echocardiogram. Echocardiography is a painless test that uses sound waves to create images of your heart. It provides your doctor with information about the size and shape of your heart and how well your heart's chambers and valves are working. This procedure takes approximately one hour. There are no restrictions for this procedure.---in 3 months with Appointment

## 2011-11-12 ENCOUNTER — Other Ambulatory Visit: Payer: Self-pay | Admitting: Cardiothoracic Surgery

## 2011-11-12 DIAGNOSIS — D213 Benign neoplasm of connective and other soft tissue of thorax: Secondary | ICD-10-CM

## 2011-11-18 ENCOUNTER — Encounter: Payer: BC Managed Care – PPO | Admitting: Cardiothoracic Surgery

## 2011-11-23 ENCOUNTER — Other Ambulatory Visit: Payer: Self-pay | Admitting: Cardiothoracic Surgery

## 2011-11-23 DIAGNOSIS — D213 Benign neoplasm of connective and other soft tissue of thorax: Secondary | ICD-10-CM

## 2011-11-24 ENCOUNTER — Other Ambulatory Visit: Payer: Self-pay | Admitting: Cardiothoracic Surgery

## 2011-11-24 DIAGNOSIS — D213 Benign neoplasm of connective and other soft tissue of thorax: Secondary | ICD-10-CM

## 2011-11-25 ENCOUNTER — Ambulatory Visit
Admission: RE | Admit: 2011-11-25 | Discharge: 2011-11-25 | Disposition: A | Discharge status: Home / Self Care | Payer: Commercial Managed Care - PPO | Source: Ambulatory Visit | Attending: Cardiothoracic Surgery | Admitting: Cardiothoracic Surgery

## 2011-11-25 ENCOUNTER — Encounter: Payer: Self-pay | Admitting: Cardiothoracic Surgery

## 2011-11-25 ENCOUNTER — Ambulatory Visit (INDEPENDENT_AMBULATORY_CARE_PROVIDER_SITE_OTHER): Payer: Commercial Managed Care - PPO | Admitting: Cardiothoracic Surgery

## 2011-11-25 VITALS — BP 146/96 | HR 72 | Resp 18

## 2011-11-25 DIAGNOSIS — D213 Benign neoplasm of connective and other soft tissue of thorax: Secondary | ICD-10-CM

## 2011-11-25 NOTE — Progress Notes (Signed)
[address]            [place] [address]          [phone]       Raiquan Chandler [facility]  Medical Record #[id number] Date of Birth: [birth year]  Jodelle Green, MD, MD  Chief Complaint:   PostOp Follow Up Visit   History of Present Illness:     Patient returns with a followup chest x-ray and also incidentally had an abdominal CT scan for a different problem in followup after resection of a schwannoma from the left chest. He does note some paresthesias in the area but this does not seem to bother her much. Currently is being evaluated for polycystic kidney disease.          History  Smoking status  . Never Smoker   Smokeless tobacco  . Never Used       Allergies  Allergen Reactions  . Codeine Other (See Comments)    Headache   . Nexium (Esomeprazole Magnesium) Other (See Comments)    Diarrhea   . Lisinopril Cough  . Sulfa Drugs Cross Reactors Rash    Current Outpatient Prescriptions  Medication Sig Dispense Refill  . allopurinol (ZYLOPRIM) 100 MG tablet Take 100 mg by mouth 2 (two) times daily.        Marland Kitchen atorvastatin (LIPITOR) 10 MG tablet Take 10 mg by mouth daily.        Marland Kitchen diltiazem (DILACOR XR) 240 MG 24 hr capsule Take 1 capsule (240 mg total) by mouth daily.  30 capsule  11  . dofetilide (TIKOSYN) 500 MCG capsule Take 1 capsule (500 mcg total) by mouth 2 (two) times daily.      . metoprolol tartrate (LOPRESSOR) 25 MG tablet Take 25 mg by mouth 2 (two) times daily.        Marland Kitchen omeprazole (PRILOSEC) 20 MG capsule Take 40 mg by mouth daily.       . potassium chloride SA (K-DUR,KLOR-CON) 20 MEQ tablet Take 20 mEq by mouth 2 (two) times daily.        Marland Kitchen warfarin (COUMADIN) 5 MG tablet Take 5 mg by mouth daily. 2.5mg   tab M, W, F, and 5mg  other days            Physical Exam: BP 146/96  Pulse 72  Resp 18  SpO2 98%  General appearance: alert and cooperative Neurologic:  intact Lungs: clear to auscultation bilaterally and normal percussion bilaterally Wound: The left thoracotomy incision is well-healed   Diagnostic Studies & Laboratory data:         Recent Radiology Findings: Dg Chest 2 View  11/25/2011  *RADIOLOGY REPORT*  Clinical Data: Follow-up appointment  CHEST - 2 VIEW  Comparison: 11/19/2010  Findings: Heart size is normal.  There is slight asymmetric elevation of the left hemidiaphragm.  Blunting of bilateral costophrenic angles identified.  This may represent small effusions.  No airspace consolidation or interstitial edema.  IMPRESSION:  1.  Decreased lung volumes, asymmetric elevation of the left hemidiaphragm and very small effusions.  Original Report Authenticated By: Rosealee Albee, M.D.      Recent Labs: Lab Results  Component Value Date   WBC 8.0 07/26/2011   HGB 14.5 07/26/2011   HCT 43.2 07/26/2011   PLT 195 07/26/2011   GLUCOSE 92 08/30/2011   ALT 18 01/24/2010  AST 30 01/24/2010   NA 141 08/30/2011   K 3.6 08/30/2011   CL 105 08/30/2011   CREATININE 1.3 08/30/2011   BUN 17 08/30/2011   CO2 29 08/30/2011   INR 2.7* 08/24/2011      Assessment / Plan:      Patient appears stable, currently on Coumadin for atrial fibrillation. There is no evidence of recurrent schwannoma in almost 2 years postop. I will not make him a return appointment to see me at this point but he is aware to call if he has any chest wall discomfort and/or further she did a stent incidentally reveal any other changes consistent with schwannoma.      Delight Ovens MD 11/25/2011 5:13 PM

## 2012-01-11 ENCOUNTER — Encounter: Payer: Self-pay | Admitting: Internal Medicine

## 2012-02-07 ENCOUNTER — Ambulatory Visit (INDEPENDENT_AMBULATORY_CARE_PROVIDER_SITE_OTHER): Payer: Commercial Managed Care - PPO | Admitting: Internal Medicine

## 2012-02-07 ENCOUNTER — Encounter: Payer: Self-pay | Admitting: Internal Medicine

## 2012-02-07 ENCOUNTER — Ambulatory Visit (HOSPITAL_COMMUNITY): Payer: Commercial Managed Care - PPO | Attending: Internal Medicine | Admitting: Radiology

## 2012-02-07 VITALS — BP 136/84 | HR 75 | Resp 18 | Ht 73.0 in | Wt 245.0 lb

## 2012-02-07 DIAGNOSIS — I517 Cardiomegaly: Secondary | ICD-10-CM | POA: Insufficient documentation

## 2012-02-07 DIAGNOSIS — I10 Essential (primary) hypertension: Secondary | ICD-10-CM | POA: Insufficient documentation

## 2012-02-07 DIAGNOSIS — I4891 Unspecified atrial fibrillation: Secondary | ICD-10-CM

## 2012-02-07 DIAGNOSIS — E669 Obesity, unspecified: Secondary | ICD-10-CM | POA: Insufficient documentation

## 2012-02-07 DIAGNOSIS — I4892 Unspecified atrial flutter: Secondary | ICD-10-CM | POA: Insufficient documentation

## 2012-02-07 NOTE — Patient Instructions (Addendum)
Your physician wants you to follow-up in: 6 months with Dr. Allred. You will receive a reminder letter in the mail two months in advance. If you don't receive a letter, please call our office to schedule the follow-up appointment.  

## 2012-02-07 NOTE — Progress Notes (Signed)
PCP:  Darnelle Bos, MD Primary Cardiologist:  Dr Anne Fu  The patient presents today for routine electrophysiology followup.  Since last being seen in our clinic, the patient reports doing well.  He has an ongoing workup for hyperaldosteronism and polycystic kidneys.  A recent adrenal venous sampling study performed at [facility] revealed R>L elevation in adrenal secretion of aldosterone.  He continues to have occasional palpitations.  These occur 1-2 times per month.   Today, he denies symptoms of chest pain, shortness of breath, orthopnea, PND, lower extremity edema, dizziness, presyncope, syncope, or neurologic sequela.  The patient feels that he is tolerating medications without difficulties and is otherwise without complaint today.   Past Medical History  Diagnosis Date  . Atrial fibrillation     paroxysmal  . Atrial flutter   . GERD (gastroesophageal reflux disease)   . Prostatitis   . Hypertension   . Pneumonia 1990's  . Arthritis   . Nephrolithiasis   . Renal insufficiency     "Cr 1.6"  . Kidney cysts     "multiple"  . Atrial enlargement, bilateral   . Adrenal cortical hyperfunction     ongoing evaluation for elevated R>L adosterone secretion   Past Surgical History  Procedure Date  . Schwannoma removal ~01/2010    left thorax  . Cardioversion 07/28/2011    Procedure: CARDIOVERSION;  Surgeon: Lewayne Bunting, MD;  Location: [facility] OR;  Service: Cardiovascular;  Laterality: N/A;  . Inguinal hernia repair     "as a child; ? right"  . Lung biopsy 1993  . Double j stent placement 06/2004    left  . Umbilical hernia repair 07/2006  . Nasal septoplasty w/ turbinoplasty 07/2006  . Prostate biopsy 2011  . Atrial fibrillation ablation with cti ablation 05/27/11    ablation for afib and atrial flutter by Dr Johney Frame    Current Outpatient Prescriptions  Medication Sig Dispense Refill  . allopurinol (ZYLOPRIM) 100 MG tablet Take 100 mg by mouth 2 (two) times daily.        Marland Kitchen  atorvastatin (LIPITOR) 10 MG tablet Take 10 mg by mouth daily.        Marland Kitchen diltiazem (DILACOR XR) 240 MG 24 hr capsule Take 1 capsule (240 mg total) by mouth daily.  30 capsule  11  . dofetilide (TIKOSYN) 500 MCG capsule Take 1 capsule (500 mcg total) by mouth 2 (two) times daily.      . metoprolol tartrate (LOPRESSOR) 25 MG tablet Take 25 mg by mouth 2 (two) times daily.        Marland Kitchen omeprazole (PRILOSEC) 20 MG capsule Take 40 mg by mouth daily.       Marland Kitchen warfarin (COUMADIN) 5 MG tablet Take 5 mg by mouth daily. 2.5mg   tab M, W, F, and 5mg  other days         Allergies  Allergen Reactions  . Codeine Other (See Comments)    Headache   . Nexium (Esomeprazole Magnesium) Other (See Comments)    Diarrhea   . Lisinopril Cough  . Sulfa Drugs Cross Reactors Rash    History   Social History  . Marital Status: Married    Spouse Name: N/A    Number of Children: N/A  . Years of Education: N/A   Occupational History  . Not on file.   Social History Main Topics  . Smoking status: Never Smoker   . Smokeless tobacco: Never Used  . Alcohol Use: 1.2 oz/week    2  Shots of liquor per week     occasional  . Drug Use: No  . Sexually Active: Yes   Other Topics Concern  . 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    Family History  Problem Relation Age of Onset  . Atrial fibrillation      sister has had 2 afib ablations   Physical Exam: Filed Vitals:   02/07/12 1627  BP: 136/84  Pulse: 75  Resp: 18  Height: 6\' 1"  (1.854 m)  Weight: 245 lb (111.131 kg)  SpO2: 98%    GEN- The patient is well appearing, alert and oriented x 3 today.   Head- normocephalic, atraumatic Eyes-  Sclera clear, conjunctiva pink Ears- hearing intact Oropharynx- clear Neck- supple, no JVP Lymph- no cervical lymphadenopathy Lungs- Clear to ausculation bilaterally, normal work of breathing Heart- Regular rate and rhythm, no murmurs, rubs or gallops, PMI not laterally displaced GI-  soft, NT, ND, + BS Extremities- no clubbing, cyanosis, or edema    Assessment and Plan:

## 2012-02-07 NOTE — Assessment & Plan Note (Signed)
I think that he is doing quite well at this point.  He has palpitations several times per month, though he has not required further cardioversion.  On several occasions that he felt that he was in afib, EKG revealed sinus.  I therefore think that it would be necessary to document afib burden before we make any major changes to our treatment strategy.  I have Casey [name] to come by our office if he thinks that he is in afib for an ekg. If he feels any significant increase in palpitations then we will place an event monitor.  As he has a busy summer planned, he is reluctant to wear a monitor at this time. His echo today is reviewed and reveals no significant structural changes.  LA is mildly enlarged and EF is preserved.

## 2012-02-07 NOTE — Progress Notes (Signed)
Echocardiogram performed.  

## 2012-02-07 NOTE — Assessment & Plan Note (Signed)
He has had quite an eventful evaluation.  I have reviewed venous sampling data from [facility] which reveals significant elevation in R adrenal venous aldosterone levels.  He has opted for treatment with spironolactone at this point.  He may consider surgical resection down the road.  He will follow closely with Dr Sharl Ma.  In addition, he is waiting to hear back regarding results of recent genetic testing for his cystic kidneys.  No changes are made today.

## 2012-03-21 ENCOUNTER — Other Ambulatory Visit: Payer: Self-pay | Admitting: Dermatology

## 2012-03-29 ENCOUNTER — Other Ambulatory Visit: Payer: Self-pay | Admitting: Nephrology

## 2012-03-29 DIAGNOSIS — Q613 Polycystic kidney, unspecified: Secondary | ICD-10-CM

## 2012-04-11 ENCOUNTER — Encounter: Payer: Self-pay | Admitting: Cardiovascular Disease

## 2012-04-11 ENCOUNTER — Ambulatory Visit (INDEPENDENT_AMBULATORY_CARE_PROVIDER_SITE_OTHER): Payer: Commercial Managed Care - PPO

## 2012-04-11 VITALS — BP 105/74 | HR 101 | Wt 238.8 lb

## 2012-04-11 DIAGNOSIS — I4891 Unspecified atrial fibrillation: Secondary | ICD-10-CM

## 2012-04-11 NOTE — Patient Instructions (Addendum)
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  He states he is having an irregular heart beat.  No other symptoms.  EKG done and given to Dr Elease Hashimoto (dod).  Pt is in no apparent distress.

## 2012-04-11 NOTE — Progress Notes (Signed)
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  He's had atrial fibrillation for years. He has had an A. fib ablation in November, 2012. He's previously been tried on flecainide.  He has been maintained on Tikosyn and 500 mcg twice a day. He has intermittent episodes of atrial fibrillation but typically converts back to normal. Casey [name] was told to come in if he has a recurrent episode of atrial fibrillation.  He played tennis last night and had a few beers. He developed atrial fibrillation last night. He worked out at [employer] this morning. He he didn't feel that he is in atrial fibrillation. He denies any chest pain, syncope, presyncope, or shortness of breath.  He presents today to get an EKG. His blood pressures 102/50. His heart rate is 19 and is irregular.  His heart rate slowed slightly after he been sitting quietly for several minutes. His heart rate at rest is probably in the 90s.  We had a discussion regarding what he would like to do. He has a sense to see later this morning he did not want to go in for cardioversion. He thinks that he will probably convert back to normal rhythm later today.  His EKG reveals atrial fibrillation at a rate of 109. His QT corrected is 535 ms.  We discussed the possibility of raising his metoprolol but at this point his blood pressure is on the low side and are not inclined to increase his metoprolol at this point. He typically converts to normal sinus rhythm on his own. He'll call us later today if he wants to be seen tomorrow for a cardioversion.  [name], [name], MD, [facility] 04/11/2012, 9:07 AM Office - [phone] Pager [phone]

## 2012-04-17 ENCOUNTER — Ambulatory Visit
Admission: RE | Admit: 2012-04-17 | Discharge: 2012-04-17 | Disposition: A | Discharge status: Home / Self Care | Payer: Commercial Managed Care - PPO | Source: Ambulatory Visit | Attending: Nephrology | Admitting: Nephrology

## 2012-04-17 DIAGNOSIS — Q613 Polycystic kidney, unspecified: Secondary | ICD-10-CM

## 2012-07-12 ENCOUNTER — Other Ambulatory Visit: Payer: Self-pay | Admitting: Physician Assistant

## 2012-08-09 ENCOUNTER — Encounter: Payer: Self-pay | Admitting: Internal Medicine

## 2012-08-09 ENCOUNTER — Ambulatory Visit (INDEPENDENT_AMBULATORY_CARE_PROVIDER_SITE_OTHER): Payer: Commercial Managed Care - PPO | Admitting: Internal Medicine

## 2012-08-09 VITALS — BP 125/79 | HR 56 | Ht 73.0 in | Wt 247.4 lb

## 2012-08-09 DIAGNOSIS — I4891 Unspecified atrial fibrillation: Secondary | ICD-10-CM

## 2012-08-09 DIAGNOSIS — I10 Essential (primary) hypertension: Secondary | ICD-10-CM

## 2012-08-09 NOTE — Patient Instructions (Addendum)
Your physician wants you to follow-up in: 6 months with Dr Jacquiline Doe will receive a reminder letter in the mail two months in advance. If you don't receive a letter, please call our office to schedule the follow-up appointment.  Call me to schedule ablation

## 2012-08-20 NOTE — Assessment & Plan Note (Signed)
The patient has recurrent symptomatic atrial fibrillation.  Though he was previously persistent, since his prior ablation his afib is now more paroxysmal.  As episodes have increased, he is now interested in repeat catheter ablation.  Previously, he underwent atrial flutter ablation also. Therapeutic strategies for afib including medicine and ablation were discussed in detail with the patient today. Risk, benefits, and alternatives to EP study and radiofrequency ablation for afib were also discussed in detail today. These risks include but are not limited to stroke, bleeding, vascular damage, tamponade, perforation, damage to the esophagus, lungs, and other structures, pulmonary vein stenosis, worsening renal function, and death. The patient understands these risk and wishes to proceed.  We will likely proceed with ablation over the next few months.  He will continue current medical therapy in the interim. Given prior ablation as well as abnormal ekg, I think that a cardiac CT would be appropriate prior to repeat ablation to better assess his pulmonary vein anatomy, exclude pulmonary vein stenosis, and also to evaluate his coronary arteries given his chronically abnormal ekg.

## 2012-08-20 NOTE — Progress Notes (Signed)
PCP:  Darnelle Bos, MD Primary Cardiologist:  Dr Anne Fu  The patient presents today for routine electrophysiology followup.  Since last being seen in our clinic, the patient reports doing well.  He continues to have occasional episodes of afib.  He presented to our office for an ekg as instructed during palpitations 04/11/12 and was confirmed to have afib.  Episodes occur 1-2 times per month with associated fatigue and decreased exercise tolerance.   Today, he denies symptoms of chest pain, shortness of breath, orthopnea, PND, lower extremity edema, dizziness, presyncope, syncope, or neurologic sequela.  The patient feels that he is tolerating medications without difficulties and is otherwise without complaint today.   Past Medical History  Diagnosis Date  . Atrial fibrillation     paroxysmal  . Atrial flutter   . GERD (gastroesophageal reflux disease)   . Prostatitis   . Hypertension   . Pneumonia 1990's  . Arthritis   . Nephrolithiasis   . Renal insufficiency     "Cr 1.6"  . Kidney cysts     "multiple"  . Atrial enlargement, bilateral   . Adrenal cortical hyperfunction     ongoing evaluation for elevated R>L adosterone secretion   Past Surgical History  Procedure Date  . Schwannoma removal ~01/2010    left thorax  . Cardioversion 07/28/2011    Procedure: CARDIOVERSION;  Surgeon: Lewayne Bunting, MD;  Location: [facility] OR;  Service: Cardiovascular;  Laterality: N/A;  . Inguinal hernia repair     "as a child; ? right"  . Lung biopsy 1993  . Double j stent placement 06/2004    left  . Umbilical hernia repair 07/2006  . Nasal septoplasty w/ turbinoplasty 07/2006  . Prostate biopsy 2011  . Atrial fibrillation ablation with cti ablation 05/27/11    ablation for afib and atrial flutter by Dr Johney Frame    Current Outpatient Prescriptions  Medication Sig Dispense Refill  . allopurinol (ZYLOPRIM) 100 MG tablet Take 100 mg by mouth 2 (two) times daily.        Marland Kitchen atorvastatin (LIPITOR)  10 MG tablet Take 10 mg by mouth daily.        Marland Kitchen diltiazem (DILACOR XR) 240 MG 24 hr capsule Take 240 mg by mouth daily.      . metoprolol tartrate (LOPRESSOR) 25 MG tablet Take 25 mg by mouth 2 (two) times daily.        Marland Kitchen omeprazole (PRILOSEC) 20 MG capsule Take 40 mg by mouth daily.       Marland Kitchen TIKOSYN 500 MCG capsule TAKE 1 CAPSULE BY MOUTH EVERY 12 HOURS  60 capsule  2  . warfarin (COUMADIN) 5 MG tablet Take 5 mg by mouth daily. 2.5mg   tab M, W, F, and 5mg  other days         Allergies  Allergen Reactions  . Codeine Other (See Comments)    Headache   . Nexium (Esomeprazole Magnesium) Other (See Comments)    Diarrhea   . Lisinopril Cough  . Sulfa Drugs Cross Reactors Rash    History   Social History  . Marital Status: Married    Spouse Name: N/A    Number of Children: N/A  . Years of Education: N/A   Occupational History  . Not on file.   Social History Main Topics  . Smoking status: Never Smoker   . Smokeless tobacco: Never Used  . Alcohol Use: 1.2 oz/week    2 Shots of liquor per week     Comment:  occasional  . Drug Use: No  . Sexually Active: Yes   Other Topics Concern  . 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    Family History  Problem Relation Age of Onset  . Atrial fibrillation      sister has had 2 afib ablations   Physical Exam: Filed Vitals:   08/09/12 0858  BP: 125/79  Pulse: 56  Height: 6\' 1"  (1.854 m)  Weight: 247 lb 6.4 oz (112.22 kg)    GEN- The patient is well appearing, alert and oriented x 3 today.   Head- normocephalic, atraumatic Eyes-  Sclera clear, conjunctiva pink Ears- hearing intact Oropharynx- clear Neck- supple, no JVP Lymph- no cervical lymphadenopathy Lungs- Clear to ausculation bilaterally, normal work of breathing Heart- Regular rate and rhythm, no murmurs, rubs or gallops, PMI not laterally displaced GI- soft, NT, ND, + BS Extremities- no clubbing, cyanosis, or edema  ekg today reveals  sinus rhythm 54 bpm, PR 274 msec (stable), poor R wave transition, LVH, inferior infarction pattern, Qtc 523  Assessment and Plan:

## 2012-08-20 NOTE — Assessment & Plan Note (Signed)
Stable No change required today  bmet today 

## 2012-09-06 ENCOUNTER — Other Ambulatory Visit: Payer: Self-pay | Admitting: *Deleted

## 2012-09-06 ENCOUNTER — Encounter: Payer: Self-pay | Admitting: *Deleted

## 2012-09-06 DIAGNOSIS — I4891 Unspecified atrial fibrillation: Secondary | ICD-10-CM

## 2012-09-13 ENCOUNTER — Encounter: Payer: Self-pay | Admitting: Internal Medicine

## 2012-09-18 ENCOUNTER — Ambulatory Visit (HOSPITAL_COMMUNITY)
Admission: RE | Admit: 2012-09-18 | Discharge: 2012-09-18 | Disposition: A | Discharge status: Home / Self Care | Payer: Commercial Managed Care - PPO | Source: Ambulatory Visit | Attending: Internal Medicine | Admitting: Internal Medicine

## 2012-09-18 DIAGNOSIS — I2510 Atherosclerotic heart disease of native coronary artery without angina pectoris: Secondary | ICD-10-CM | POA: Insufficient documentation

## 2012-09-18 DIAGNOSIS — I4891 Unspecified atrial fibrillation: Secondary | ICD-10-CM | POA: Insufficient documentation

## 2012-09-18 DIAGNOSIS — R222 Localized swelling, mass and lump, trunk: Secondary | ICD-10-CM | POA: Insufficient documentation

## 2012-09-18 DIAGNOSIS — I2584 Coronary atherosclerosis due to calcified coronary lesion: Secondary | ICD-10-CM | POA: Insufficient documentation

## 2012-09-18 MED ORDER — METOPROLOL TARTRATE 1 MG/ML IV SOLN
INTRAVENOUS | Status: AC
  Filled 2012-09-18: qty 20

## 2012-09-18 MED ORDER — NITROGLYCERIN 0.4 MG SL SUBL
SUBLINGUAL_TABLET | SUBLINGUAL | Status: AC
  Administered 2012-09-18: 0.4 mg
  Filled 2012-09-18: qty 25

## 2012-09-18 MED ORDER — IOHEXOL 350 MG/ML SOLN
80.0000 mL | Freq: Once | INTRAVENOUS | Status: AC | PRN
  Administered 2012-09-18: 80 mL via INTRAVENOUS

## 2012-09-20 ENCOUNTER — Other Ambulatory Visit (INDEPENDENT_AMBULATORY_CARE_PROVIDER_SITE_OTHER): Payer: Commercial Managed Care - PPO

## 2012-09-20 DIAGNOSIS — I4891 Unspecified atrial fibrillation: Secondary | ICD-10-CM

## 2012-09-20 LAB — BASIC METABOLIC PANEL
CO2: 29 mEq/L (ref 19–32)
Calcium: 8.5 mg/dL (ref 8.4–10.5)
Creatinine, Ser: 1.1 mg/dL (ref 0.4–1.5)
GFR: 70.76 mL/min (ref >60.00)
Glucose, Bld: 91 mg/dL (ref 70–99)

## 2012-09-20 LAB — CBC WITH DIFFERENTIAL/PLATELET
Basophils Absolute: 0 10*3/uL (ref 0.0–0.1)
Eosinophils Absolute: 0.1 10*3/uL (ref 0.0–0.7)
Lymphocytes Relative: 20.2 % (ref 12.0–46.0)
MCHC: 33 g/dL (ref 30.0–36.0)
Monocytes Relative: 6.9 % (ref 3.0–12.0)
Neutrophils Relative %: 71.5 % (ref 43.0–77.0)
RDW: 15.9 % — ABNORMAL HIGH (ref 11.5–14.6)

## 2012-09-20 LAB — PROTIME-INR
INR: 3.4 ratio — ABNORMAL HIGH (ref 0.8–1.0)
Prothrombin Time: 35.3 s — ABNORMAL HIGH (ref 10.2–12.4)

## 2012-09-22 ENCOUNTER — Encounter (HOSPITAL_COMMUNITY): Payer: Self-pay | Admitting: Pharmacy Technician

## 2012-09-26 ENCOUNTER — Other Ambulatory Visit: Payer: Commercial Managed Care - PPO

## 2012-09-26 ENCOUNTER — Other Ambulatory Visit: Payer: Self-pay | Admitting: *Deleted

## 2012-09-26 DIAGNOSIS — I4891 Unspecified atrial fibrillation: Secondary | ICD-10-CM

## 2012-09-27 ENCOUNTER — Encounter (HOSPITAL_COMMUNITY): Payer: Self-pay | Admitting: *Deleted

## 2012-09-27 ENCOUNTER — Ambulatory Visit (HOSPITAL_COMMUNITY)
Admission: RE | Admit: 2012-09-27 | Discharge: 2012-09-27 | Disposition: A | Discharge status: Home / Self Care | Payer: Commercial Managed Care - PPO | Source: Ambulatory Visit | Attending: Cardiology | Admitting: Cardiology

## 2012-09-27 ENCOUNTER — Encounter (HOSPITAL_COMMUNITY)
Admission: RE | Disposition: A | Discharge status: Home / Self Care | Payer: Self-pay | Source: Ambulatory Visit | Attending: Cardiology

## 2012-09-27 DIAGNOSIS — I4891 Unspecified atrial fibrillation: Secondary | ICD-10-CM

## 2012-09-27 HISTORY — PX: TEE WITHOUT CARDIOVERSION: SHX5443

## 2012-09-27 SURGERY — ECHOCARDIOGRAM, TRANSESOPHAGEAL
Anesthesia: Moderate Sedation

## 2012-09-27 MED ORDER — MIDAZOLAM HCL 5 MG/ML IJ SOLN
INTRAMUSCULAR | Status: AC
  Filled 2012-09-27: qty 2

## 2012-09-27 MED ORDER — FENTANYL CITRATE 0.05 MG/ML IJ SOLN
INTRAMUSCULAR | Status: AC
  Filled 2012-09-27: qty 2

## 2012-09-27 MED ORDER — MIDAZOLAM HCL 10 MG/2ML IJ SOLN
INTRAMUSCULAR | Status: DC | PRN
  Administered 2012-09-27 (×2): 2 mg via INTRAVENOUS
  Administered 2012-09-27 (×2): 1 mg via INTRAVENOUS

## 2012-09-27 MED ORDER — FENTANYL CITRATE 0.05 MG/ML IJ SOLN
INTRAMUSCULAR | Status: DC | PRN
  Administered 2012-09-27 (×3): 25 ug via INTRAVENOUS

## 2012-09-27 MED ORDER — SODIUM CHLORIDE 0.9 % IV SOLN
INTRAVENOUS | Status: DC
  Administered 2012-09-27: 15:00:00 via INTRAVENOUS

## 2012-09-27 MED ORDER — BUTAMBEN-TETRACAINE-BENZOCAINE 2-2-14 % EX AERO
INHALATION_SPRAY | CUTANEOUS | Status: DC | PRN
  Administered 2012-09-27: 2 via TOPICAL

## 2012-09-27 NOTE — H&P (Signed)
   57 yo with history of PAF presents for TEE.  He will have atrial fibrillation ablation (re-do) tomorrow.  No new complaints except occasional tachypalpitations.   Marca Ancona 09/27/2012 2:52 PM

## 2012-09-27 NOTE — CV Procedure (Signed)
Procedure: TEE  Indication: Pre-atrial fibrillation ablation  Sedation: Versed 6 mg IV, Fentanyl 75 mcg IV  Findings: Please see echo section for full report.  Normal LV size and systolic function, EF 60%.  Mild LAE with no LAA thrombus.  Normal RV size and systolic function.  No significant valvular abnormality.   No complications.  May proceed to atrial fibrillation ablation.   Casey [name] 09/27/2012 3:13 PM

## 2012-09-28 ENCOUNTER — Ambulatory Visit (HOSPITAL_COMMUNITY): Payer: Commercial Managed Care - PPO | Admitting: Anesthesiology

## 2012-09-28 ENCOUNTER — Encounter (HOSPITAL_COMMUNITY): Payer: Self-pay | Admitting: Anesthesiology

## 2012-09-28 ENCOUNTER — Ambulatory Visit (HOSPITAL_COMMUNITY)
Admission: RE | Admit: 2012-09-28 | Discharge: 2012-09-29 | Disposition: A | Discharge status: Home / Self Care | Payer: Commercial Managed Care - PPO | Source: Ambulatory Visit | Attending: Internal Medicine | Admitting: Internal Medicine

## 2012-09-28 ENCOUNTER — Encounter (HOSPITAL_COMMUNITY)
Admission: RE | Disposition: A | Discharge status: Home / Self Care | Payer: Self-pay | Source: Ambulatory Visit | Attending: Internal Medicine

## 2012-09-28 DIAGNOSIS — I4891 Unspecified atrial fibrillation: Secondary | ICD-10-CM | POA: Insufficient documentation

## 2012-09-28 DIAGNOSIS — I129 Hypertensive chronic kidney disease with stage 1 through stage 4 chronic kidney disease, or unspecified chronic kidney disease: Secondary | ICD-10-CM | POA: Insufficient documentation

## 2012-09-28 DIAGNOSIS — E876 Hypokalemia: Secondary | ICD-10-CM | POA: Insufficient documentation

## 2012-09-28 DIAGNOSIS — I4892 Unspecified atrial flutter: Secondary | ICD-10-CM | POA: Diagnosis present

## 2012-09-28 DIAGNOSIS — N189 Chronic kidney disease, unspecified: Secondary | ICD-10-CM | POA: Insufficient documentation

## 2012-09-28 DIAGNOSIS — I10 Essential (primary) hypertension: Secondary | ICD-10-CM

## 2012-09-28 HISTORY — PX: ATRIAL FIBRILLATION ABLATION: SHX5456

## 2012-09-28 LAB — POCT ACTIVATED CLOTTING TIME
Activated Clotting Time: 236 seconds
Activated Clotting Time: 187 seconds
Activated Clotting Time: 170 seconds

## 2012-09-28 LAB — MRSA PCR SCREENING: MRSA by PCR: NEGATIVE

## 2012-09-28 LAB — PROTIME-INR
INR: 2.98 — ABNORMAL HIGH (ref 0.00–1.49)
Prothrombin Time: 29.4 seconds — ABNORMAL HIGH (ref 11.6–15.2)

## 2012-09-28 SURGERY — ATRIAL FIBRILLATION ABLATION
Anesthesia: Monitor Anesthesia Care

## 2012-09-28 MED ORDER — DEXAMETHASONE SODIUM PHOSPHATE 4 MG/ML IJ SOLN
INTRAMUSCULAR | Status: DC | PRN
  Administered 2012-09-28: 4 mg via INTRAVENOUS

## 2012-09-28 MED ORDER — IRBESARTAN 75 MG PO TABS
75.0000 mg | ORAL_TABLET | Freq: Every day | ORAL | Status: DC
  Administered 2012-09-28: 75 mg via ORAL
  Filled 2012-09-28 (×3): qty 1

## 2012-09-28 MED ORDER — HYDROCODONE-ACETAMINOPHEN 5-325 MG PO TABS: 1.0000 | ORAL_TABLET | ORAL | Status: DC | PRN

## 2012-09-28 MED ORDER — ISOPROTERENOL HCL 0.2 MG/ML IJ SOLN
1000.0000 ug | INTRAMUSCULAR | Status: DC | PRN
  Administered 2012-09-28: 20 ug/min via INTRAVENOUS

## 2012-09-28 MED ORDER — SODIUM CHLORIDE 0.9 % IJ SOLN
3.0000 mL | Freq: Two times a day (BID) | INTRAMUSCULAR | Status: DC
  Administered 2012-09-28 (×2): 3 mL via INTRAVENOUS

## 2012-09-28 MED ORDER — SODIUM CHLORIDE 0.9 % IV SOLN: 250.0000 mL | INTRAVENOUS | Status: DC | PRN

## 2012-09-28 MED ORDER — WARFARIN SODIUM 2.5 MG PO TABS
2.5000 mg | ORAL_TABLET | ORAL | Status: DC
  Administered 2012-09-28: 2.5 mg via ORAL
  Filled 2012-09-28: qty 1

## 2012-09-28 MED ORDER — EPHEDRINE SULFATE 50 MG/ML IJ SOLN
INTRAMUSCULAR | Status: DC | PRN
  Administered 2012-09-28 (×2): 10 mg via INTRAVENOUS

## 2012-09-28 MED ORDER — WARFARIN - PHYSICIAN DOSING INPATIENT: Freq: Every day | Status: DC

## 2012-09-28 MED ORDER — WARFARIN SODIUM 5 MG PO TABS
5.0000 mg | ORAL_TABLET | ORAL | Status: DC
  Filled 2012-09-28: qty 1

## 2012-09-28 MED ORDER — WARFARIN SODIUM 2.5 MG PO TABS
2.5000 mg | ORAL_TABLET | ORAL | Status: DC
  Filled 2012-09-28: qty 1

## 2012-09-28 MED ORDER — PROPOFOL 10 MG/ML IV BOLUS
INTRAVENOUS | Status: DC | PRN
  Administered 2012-09-28: 200 mg via INTRAVENOUS
  Administered 2012-09-28: 40 mg via INTRAVENOUS

## 2012-09-28 MED ORDER — DEXTROSE 5 % IV SOLN
INTRAVENOUS | Status: AC
  Filled 2012-09-28: qty 250

## 2012-09-28 MED ORDER — LIDOCAINE HCL 1 % IJ SOLN
INTRAMUSCULAR | Status: DC | PRN
  Administered 2012-09-28: 100 mg via INTRADERMAL

## 2012-09-28 MED ORDER — HEPARIN SODIUM (PORCINE) 1000 UNIT/ML IJ SOLN
INTRAMUSCULAR | Status: DC | PRN
  Administered 2012-09-28: 5000 [IU] via INTRAVENOUS

## 2012-09-28 MED ORDER — SODIUM CHLORIDE 0.9 % IJ SOLN: 3.0000 mL | INTRAMUSCULAR | Status: DC | PRN

## 2012-09-28 MED ORDER — WARFARIN SODIUM 5 MG PO TABS: 5.0000 mg | ORAL_TABLET | ORAL | Status: DC

## 2012-09-28 MED ORDER — LACTATED RINGERS IV SOLN
INTRAVENOUS | Status: DC | PRN
  Administered 2012-09-28: 07:00:00 via INTRAVENOUS

## 2012-09-28 MED ORDER — FENTANYL CITRATE 0.05 MG/ML IJ SOLN
INTRAMUSCULAR | Status: DC | PRN
  Administered 2012-09-28: 50 ug via INTRAVENOUS
  Administered 2012-09-28: 100 ug via INTRAVENOUS
  Administered 2012-09-28: 50 ug via INTRAVENOUS

## 2012-09-28 MED ORDER — ACETAMINOPHEN 325 MG PO TABS: 650.0000 mg | ORAL_TABLET | ORAL | Status: DC | PRN

## 2012-09-28 MED ORDER — ALLOPURINOL 100 MG PO TABS
100.0000 mg | ORAL_TABLET | Freq: Two times a day (BID) | ORAL | Status: DC
  Administered 2012-09-28 – 2012-09-29 (×2): 100 mg via ORAL
  Filled 2012-09-28 (×3): qty 1

## 2012-09-28 MED ORDER — PANTOPRAZOLE SODIUM 40 MG PO TBEC
40.0000 mg | DELAYED_RELEASE_TABLET | Freq: Every day | ORAL | Status: DC
  Administered 2012-09-28: 40 mg via ORAL
  Filled 2012-09-28: qty 1

## 2012-09-28 MED ORDER — POTASSIUM CHLORIDE CRYS ER 20 MEQ PO TBCR
EXTENDED_RELEASE_TABLET | ORAL | Status: AC
  Filled 2012-09-28: qty 2

## 2012-09-28 MED ORDER — POTASSIUM CHLORIDE CRYS ER 20 MEQ PO TBCR
40.0000 meq | EXTENDED_RELEASE_TABLET | Freq: Once | ORAL | Status: AC
  Administered 2012-09-28: 40 meq via ORAL

## 2012-09-28 MED ORDER — WARFARIN SODIUM 2.5 MG PO TABS: 2.5000 mg | ORAL_TABLET | Freq: Every day | ORAL | Status: DC

## 2012-09-28 MED ORDER — BUPIVACAINE HCL (PF) 0.25 % IJ SOLN
INTRAMUSCULAR | Status: AC
  Filled 2012-09-28: qty 30

## 2012-09-28 MED ORDER — ONDANSETRON HCL 4 MG/2ML IJ SOLN: 4.0000 mg | Freq: Four times a day (QID) | INTRAMUSCULAR | Status: DC | PRN

## 2012-09-28 MED ORDER — DOFETILIDE 500 MCG PO CAPS
500.0000 ug | ORAL_CAPSULE | Freq: Two times a day (BID) | ORAL | Status: DC
  Administered 2012-09-28 – 2012-09-29 (×2): 500 ug via ORAL
  Filled 2012-09-28 (×3): qty 1

## 2012-09-28 MED ORDER — HYDROXYUREA 500 MG PO CAPS
ORAL_CAPSULE | ORAL | Status: AC
  Filled 2012-09-28: qty 1

## 2012-09-28 MED ORDER — HEPARIN SODIUM (PORCINE) 1000 UNIT/ML IJ SOLN
INTRAMUSCULAR | Status: AC
  Filled 2012-09-28: qty 1

## 2012-09-28 MED ORDER — PROTAMINE SULFATE 10 MG/ML IV SOLN
INTRAVENOUS | Status: DC | PRN
  Administered 2012-09-28: 10 mg via INTRAVENOUS
  Administered 2012-09-28: 5 mg via INTRAVENOUS
  Administered 2012-09-28: 15 mg via INTRAVENOUS

## 2012-09-28 MED ORDER — MIDAZOLAM HCL 5 MG/5ML IJ SOLN
INTRAMUSCULAR | Status: DC | PRN
  Administered 2012-09-28: 2 mg via INTRAVENOUS

## 2012-09-28 MED ORDER — ONDANSETRON HCL 4 MG/2ML IJ SOLN
INTRAMUSCULAR | Status: DC | PRN
  Administered 2012-09-28: 4 mg via INTRAVENOUS

## 2012-09-28 NOTE — Op Note (Signed)
SURGEON:  Hillis Range, MD  PREPROCEDURE DIAGNOSES: 1. Paroxysmal atrial fibrillation.  POSTPROCEDURE DIAGNOSES: 1. Paroxysmal  atrial fibrillation.  PROCEDURES: 1. Comprehensive electrophysiologic study. 2. Coronary sinus pacing and recording. 3. Three-dimensional mapping of (atrial fibrillation with additional mapping and ablation of a second discrete focus) 4. Ablation of (atrial fibrillation with additional mapping and ablation of a second discrete focus) 5. Intracardiac echocardiography. 6. Transseptal puncture of an intact septum. 7.Arrhythmia induction with pacing with isuprel infusion  INTRODUCTION:  Casey [name] is a 57 y.o. male with a history of paroxysmal atrial fibrillation who now presents for EP study and radiofrequency ablation.  The patient reports initially being diagnosed with atrial fibrillation after presenting with symptomatic palpitations and fatgiue. The patient reports increasing frequency and duration of atrial fibrillation since that time.  The patient has failed medical therapy with Durenda Guthrie, and subsequently amiodarone.  The patient therefore presents today for catheter ablation of atrial fibrillation.  DESCRIPTION OF PROCEDURE:  Informed written consent was obtained, and the patient was brought to the electrophysiology lab in a fasting state.  The patient was adequately sedated with intravenous medications as outlined in the anesthesia report.  The patient's left and right groins were prepped and draped in the usual sterile fashion by the EP lab staff.  Using a percutaneous Seldinger technique, two 7-French and one 8-French hemostasis sheaths were placed into the right common femoral vein.  A 4- [place] hemostasis sheath was placed in the right common femoral artery for blood pressure monitoring.  An 11-French hemostasis sheath was placed into the left common femoral vein.   CT MERGE: A previously rendered CT was then merged with carto.  Reprocessing at an  independent work station was then performed.  This demonstrated a moderate sized left atrium with 4 separate pulmonary veins which were also moderate in size.  There were no anomalous veins or significant abnormalities.  A 3 dimensional rendering of the left atrium was then merged using [employer] onto the Well[employer] system and registered with intracardiac echo (see below).   Catheter Placement:  A 7-French Biosense Webster Decapolar coronary sinus catheter was introduced through the right common femoral vein and advanced into the coronary sinus for recording and pacing from this location.  A 6-French quadripolar Josephson catheter was introduced through the right common femoral vein and advanced into the right ventricle for recording and pacing.  This catheter was then pulled back to the His bundle location.    Initial Measurements: The patient presented to the electrophysiology lab in sinus rhythm.  His PR interval measured *273 with a QRS duringation of 99 and a QT interval of 561 msec.  The AH interval measured 174 and the HV interval measured 54 msec.     Intracardiac Echocardiography: A 10-French Biosense Webster AcuNav intracardiac echocardiography catheter was introduced through the left common femoral vein and advanced into the right atrium. Intracardiac echocardiography was performed of the left atrium, and a three-dimensional anatomical rendering of the left atrium was performed using CARTO sound technology.  The patient was noted to have a moderate sized left atrium.  The interatrial septum was prominent but not aneurysmal. All 4 pulmonary veins were visualized and noted to have separate ostia.  The pulmonary veins were moderate in size.  The left atrial appendage was visualized and did not reveal thrombus.   There was no evidence of pulmonary vein stenosis.   Transseptal Puncture: The middle right common femoral vein sheath was exchanged for an 8.5  [place] SL2 transseptal  sheath and transseptal access was achieved in a standard fashion using a Brockenbrough needle under biplane fluoroscopy with intracardiac echocardiography confirmation of the transseptal puncture.  Once transseptal access had been achieved, heparin was administered intravenously and intra- arterially in order to maintain an ACT of greater than 350 seconds throughout the procedure.   3D Mapping and Ablation: The His bundle catheter was removed and in its place a 3.5 mm Biosense Webster EZ [employer] ablation catheter was advanced into the right atrium.  The transseptal sheath was pulled back into the IVC over a guidewire.  The ablation catheter was advanced across the transseptal hole using the wire as a guide.  The transseptal sheath was then re-advanced over the guidewire into the left atrium.  A duodecapolar Biosense Webster circular mapping catheter was introduced through the transseptal sheath and positioned over the mouth of all 4 pulmonary veins.  Three-dimensional electroanatomical mapping was performed using CARTO technology.  This demonstrated return of electrical activity within the left inferior and right superior pulmonary veins at baseline. The patient underwent successful electrical reisolation and anatomical encircling of these pulmonary veins using radiofrequency current with a circular mapping catheter as a guide.   The circular mapping catheter was pulled back into the right atrium and 3D mapping was performed at the junction of the superior vena cava and right atrium.  Electrical activity was observed within the SVC.  I therefore elected to perform right atrial ablation in this area.  A series of radiofrequency applications were delivered in a circular fashion around the ostium of the SVC.  Prior to each ablation lesions, pacing was performed from the distal ablation electrode to insure that diaphragmatic stimulation was not observed to avoid phrenic nerve injury.  Diaphragmatic  excursion was also observed during ablation.     Measurements Following Ablation: Following ablation, Isuprel was infused up to 20 mcg/min with no inducible atrial fibrillation, atrial tachycardia, atrial flutter, or sustained PACs. In sinus rhythm with RR interval was 1109, with PR 265 msec, QRS 117 msec, and Qtc 536 msec.  Following ablation the AH interval measured with an HV  interval of 54 msec. Ventricular pacing was performed, which revealed VA dissociation when pacing at 600 msec.  Rapid atrial pacing was performed, which revealed an AV Wenckebach cycle length of 530 msec. No arrhythmias were induced with RAP down to a CL of .  Electroisolation was then again confirmed in all four pulmonary veins.  Differential atrial pacing was performed from the low lateral right atrium which demonstrated complete bidirectional cavotricuspid isthmus block from a prior ablation procedure with a stimulus to earliest activation recorded bidirectionally across the isthmus measuring .   Intracardiac echocardiography was again performed, which revealed no pericardial effusion.  The procedure was therefore considered completed.  All catheters were removed, and the sheaths were aspirated and flushed.  The patient was transferred to the recovery area for sheath removal per protocol.  A limited bedside transthoracic echocardiogram revealed no pericardial effusion.  There were no early apparent complications.  CONCLUSIONS: 1. Sinus rhythm upon presentation.   2. Intracardiac echocardiography reveals a moderate sized left atrium with four separate pulmonary veins without evidence of pulmonary vein stenosis. 3. Return of electrical conduction within the right superior and left inferior pulmonary veins.  These veins were electrically reisolated and anatomical encircling with radiofrequency current.  SVC also encircled with RF today. 4. No inducible arrhythmias following ablation both on and off of  Isuprel.  Complete bidirectional cavotricuspid isthmus block persists from a prior ablation 5. No early apparent complications.   Fayrene Fearing Allred,MD 10:22 AM 09/28/2012

## 2012-09-28 NOTE — H&P (Signed)
PCP:  Darnelle Bos, MD Primary Cardiologist:  Dr Anne Fu   The patient presents today for repeat afib ablation.  Since last being seen in our clinic, the patient reports doing well.  He continues to have occasional episodes of afib.  He presented to our office for an ekg as instructed during palpitations 04/11/12 and was confirmed to have afib.  Episodes occur 1-2 times per month with associated fatigue and decreased exercise tolerance.   Today, he denies symptoms of chest pain, shortness of breath, orthopnea, PND, lower extremity edema, dizziness, presyncope, syncope, or neurologic sequela.  The patient feels that he is tolerating medications without difficulties and is otherwise without complaint today.     Past Medical History   Diagnosis  Date   .  Atrial fibrillation         paroxysmal   .  Atrial flutter     .  GERD (gastroesophageal reflux disease)     .  Prostatitis     .  Hypertension     .  Pneumonia  1990's   .  Arthritis     .  Nephrolithiasis     .  Renal insufficiency         "Cr 1.6"   .  Kidney cysts         "multiple"   .  Atrial enlargement, bilateral     .  Adrenal cortical hyperfunction         ongoing evaluation for elevated R>L adosterone secretion       Past Surgical History   Procedure  Date   .  Schwannoma removal  ~01/2010       left thorax   .  Cardioversion  07/28/2011       Procedure: CARDIOVERSION;  Surgeon: Lewayne Bunting, MD;  Location: [facility] OR;  Service: Cardiovascular;  Laterality: N/A;   .  Inguinal hernia repair         "as a child; ? right"   .  Lung biopsy  1993   .  Double j stent placement  06/2004       left   .  Umbilical hernia repair  07/2006   .  Nasal septoplasty w/ turbinoplasty  07/2006   .  Prostate biopsy  2011   .  Atrial fibrillation ablation with cti ablation  05/27/11       ablation for afib and atrial flutter by Dr Johney Frame         Current Outpatient Prescriptions   Medication  Sig  Dispense  Refill   .   allopurinol (ZYLOPRIM) 100 MG tablet  Take 100 mg by mouth 2 (two) times daily.           Marland Kitchen  atorvastatin (LIPITOR) 10 MG tablet  Take 10 mg by mouth daily.           Marland Kitchen  diltiazem (DILACOR XR) 240 MG 24 hr capsule  Take 240 mg by mouth daily.         .  metoprolol tartrate (LOPRESSOR) 25 MG tablet  Take 25 mg by mouth 2 (two) times daily.           Marland Kitchen  omeprazole (PRILOSEC) 20 MG capsule  Take 40 mg by mouth daily.          Marland Kitchen  TIKOSYN 500 MCG capsule  TAKE 1 CAPSULE BY MOUTH EVERY 12 HOURS   60 capsule   2   .  warfarin (COUMADIN) 5 MG tablet  Take 5 mg by mouth daily. 2.5mg   tab M, W, F, and 5mg  other days                Allergies   Allergen  Reactions   .  Codeine  Other (See Comments)       Headache    .  Nexium (Esomeprazole Magnesium)  Other (See Comments)       Diarrhea    .  Lisinopril  Cough   .  Sulfa Drugs Cross Reactors  Rash         History       Social History   .  Marital Status:  Married       Spouse Name:  N/A       Number of Children:  N/A   .  Years of Education:  N/A       Occupational History   .  Not on file.       Social History Main Topics   .  Smoking status:  Never Smoker    .  Smokeless tobacco:  Never Used   .  Alcohol Use:  1.2 oz/week       2 Shots of liquor per week         Comment: occasional   .  Drug Use:  No   .  Sexually Active:  Yes       Other Topics  Concern   .  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         Family History   Problem  Relation  Age of Onset   .  Atrial fibrillation           sister has had 2 afib ablations      Physical Exam: Filed Vitals:     08/09/12 0858   BP:  125/79   Pulse:  56   Height:  6\' 1"  (1.854 m)   Weight:  247 lb 6.4 oz (112.22 kg)        GEN- The patient is well appearing, alert and oriented x 3 today.    Head- normocephalic, atraumatic Eyes-  Sclera clear, conjunctiva pink Ears- hearing intact Oropharynx- clear Neck- supple, no  JVP Lymph- no cervical lymphadenopathy Lungs- Clear to ausculation bilaterally, normal work of breathing Heart- Regular rate and rhythm, no murmurs, rubs or gallops, PMI not laterally displaced GI- soft, NT, ND, + BS Extremities- no clubbing, cyanosis, or edema   Assessment and Plan:         Atrial fibrillation     The patient has recurrent symptomatic atrial fibrillation.  Though he was previously persistent, since his prior ablation his afib is now more paroxysmal.  As episodes have increased, he is now interested in repeat catheter ablation.  Previously, he underwent atrial flutter ablation also. Therapeutic strategies for afib including medicine and ablation were discussed in detail with the patient today. Risk, benefits, and alternatives to EP study and radiofrequency ablation for afib were also discussed in detail today. These risks include but are not limited to stroke, bleeding, vascular damage, tamponade, perforation, damage to the esophagus, lungs, and other structures, pulmonary vein stenosis, worsening renal function, and death. The patient understands these risk and wishes to proceed.  We will likely proceed with ablation at this time.

## 2012-09-28 NOTE — Anesthesia Postprocedure Evaluation (Signed)
  Anesthesia Post-op Note  Patient: Casey [name] [facility]  Procedure(s) Performed: Procedure(s): ATRIAL FIBRILLATION ABLATION (N/A)  Patient Location: PACU  Anesthesia Type:General  Level of Consciousness: awake, oriented and patient cooperative  Airway and Oxygen Therapy: Patient Spontanous Breathing  Post-op Pain: none  Post-op Assessment: Post-op Vital signs reviewed, Patient's Cardiovascular Status Stable, Respiratory Function Stable, Patent Airway, No signs of Nausea or vomiting and Pain level controlled  Post-op Vital Signs: stable  Complications: No apparent anesthesia complications

## 2012-09-28 NOTE — Transfer of Care (Signed)
Immediate Anesthesia Transfer of Care Note  Patient: Casey [name] [facility]  Procedure(s) Performed: Procedure(s): ATRIAL FIBRILLATION ABLATION (N/A)  Patient Location: PACU and Cath Lab  Anesthesia Type:General  Level of Consciousness: awake, alert , oriented and patient cooperative  Airway & Oxygen Therapy: Patient Spontanous Breathing and Patient connected to nasal cannula oxygen  Post-op Assessment: Report given to PACU RN and Post -op Vital signs reviewed and stable  Post vital signs: Reviewed and stable  Complications: No apparent anesthesia complications

## 2012-09-28 NOTE — Progress Notes (Signed)
Utilization Review Completed.Dorcas Carrow T3/12/2012

## 2012-09-28 NOTE — Preoperative (Signed)
Beta Blockers   Reason not to administer Beta Blockers:metoprolol 09/28/12 0500

## 2012-09-28 NOTE — Anesthesia Procedure Notes (Signed)
Procedure Name: LMA Insertion Date/Time: 09/28/2012 8:06 AM Performed by: Leona Singleton A Pre-anesthesia Checklist: Patient identified Patient Re-evaluated:Patient Re-evaluated prior to inductionOxygen Delivery Method: Circle system utilized Preoxygenation: Pre-oxygenation with 100% oxygen Intubation Type: IV induction LMA: LMA inserted LMA Size: 5.0 Tube type: Oral Number of attempts: 1 Placement Confirmation: positive ETCO2 and breath sounds checked- equal and bilateral Tube secured with: Tape Dental Injury: Teeth and Oropharynx as per pre-operative assessment

## 2012-09-28 NOTE — Anesthesia Preprocedure Evaluation (Addendum)
Anesthesia Evaluation  Patient identified by MRN, date of birth, ID band Patient awake    Reviewed: Allergy & Precautions, H&P , NPO status , Patient's Chart, lab work & pertinent test results, reviewed documented beta blocker date and time   History of Anesthesia Complications Negative for: history of anesthetic complications  Airway Mallampati: III TM Distance: >3 FB Neck ROM: Full    Dental  (+) Teeth Intact and Dental Advisory Given   Pulmonary pneumonia -, resolved,          Cardiovascular hypertension, Pt. on medications and Pt. on home beta blockers + dysrhythmias Atrial Fibrillation  02/07/12 echo Study Conclusions  - Left ventricle: The cavity size was normal. Wall thickness   was normal. The estimated ejection fraction was 55%. Wall   motion was normal; there were no regional wall motion   abnormalities. Doppler parameters are consistent with   abnormal left ventricular relaxation (grade 1 diastolic   dysfunction). - Aortic valve: There was no stenosis. - Aorta: Mildly dilated aortic root. Aortic root dimension:   40mm (ED). - Mitral valve: No significant regurgitation. - Left atrium: The atrium was mildly dilated. Volume 54 mL. - Right ventricle: The cavity size was normal. Systolic   function was normal. - Tricuspid valve: Peak RV-RA gradient: 20mm Hg (S). - Pulmonary arteries: PA peak pressure: 25mm Hg (S). - Inferior vena cava: The vessel was normal in size; the   respirophasic diameter changes were in the normal range (=   50%); findings are consistent with normal central venous   pressure. Impressions:  - Normal LV size and systolic function, EF 55%. Normal RV   size and systolic function. Mildly dilated aortic root.   Mild left atrial enlargement. No significant valvular   abnormalities.     Neuro/Psych negative neurological ROS  negative psych ROS   GI/Hepatic Neg liver ROS, GERD-  Medicated and  Controlled,  Endo/Other  Morbid obesityAdrenal cortical hyperfunction  Renal/GU Renal InsufficiencyRenal disease (Cr 1.6)  negative genitourinary   Musculoskeletal negative musculoskeletal ROS (+)   Abdominal (+) + obese,   Peds  Hematology  (+) Blood dyscrasia (Coumadin), , gout   Anesthesia Other Findings   Reproductive/Obstetrics                       Anesthesia Physical Anesthesia Plan  ASA: III  Anesthesia Plan: General and MAC   Post-op Pain Management:    Induction: Intravenous  Airway Management Planned: LMA and Simple Face Mask  Additional Equipment:   Intra-op Plan:   Post-operative Plan: Extubation in OR  Informed Consent: I have reviewed the patients History and Physical, chart, labs and discussed the procedure including the risks, benefits and alternatives for the proposed anesthesia with the patient or authorized representative who has indicated his/her understanding and acceptance.     Plan Discussed with: CRNA, Anesthesiologist and Surgeon  Anesthesia Plan Comments:         Anesthesia Quick Evaluation

## 2012-09-29 DIAGNOSIS — I4891 Unspecified atrial fibrillation: Secondary | ICD-10-CM

## 2012-09-29 DIAGNOSIS — I10 Essential (primary) hypertension: Secondary | ICD-10-CM

## 2012-09-29 LAB — PROTIME-INR
INR: 2.67 — ABNORMAL HIGH (ref 0.00–1.49)
Prothrombin Time: 27.1 seconds — ABNORMAL HIGH (ref 11.6–15.2)

## 2012-09-29 NOTE — Discharge Summary (Signed)
Physician Discharge Summary      Patient ID: Casey [name] MRN: [id number] DOB/AGE: 12/10/1955 57 y.o.  Admit date: 09/28/2012 Discharge date: 09/29/2012  Primary Discharge Diagnosis Atrial fibrillation Secondary Discharge Diagnosis  Hypetension, hypokalemia, chronic renal insufficiency  Significant Diagnostic Studies:  Atrial fibrillation ablation  Hospital Course:  The patient presented for afib ablation.  The procedure was performed without complication.  He was found to have return of electrical conduction within the RSPV and LIPV.  These veins were reisolated using RF.  Additional ablation was performed within the proximal SVC.  Cavotricuspid isthmus block was confirmed from the previous ablation.  No arrhythmias were induced on/ off isuprel. The patient was observed overnight without complications.  At time of discharge, the patient was alert, ambulatory, and without complaint.   Discharge Exam:  Physical Exam: Filed Vitals:   09/28/12 1900 09/28/12 2000 09/28/12 2357 09/29/12 0000  BP: 141/83 137/93  119/57  Pulse: 79 86  74  Temp:   98.5 F (36.9 C)   TempSrc:   Oral   Resp: 22 19    Height:      Weight:      SpO2: 96% 94% 94% 94%    GEN- The patient is well appearing, alert and oriented x 3 today.   Head- normocephalic, atraumatic Eyes-  Sclera clear, conjunctiva pink Ears- hearing intact Oropharynx- clear Neck- supple, no JVP Lymph- no cervical lymphadenopathy Lungs- Clear to ausculation bilaterally, normal work of breathing Heart- Regular rate and rhythm, no murmurs, rubs or gallops, PMI not laterally displaced GI- soft, NT, ND, + BS Extremities- no clubbing, cyanosis, or edema MS- no significant deformity or atrophy Skin- no rash or lesion Psych- euthymic mood, full affect Neuro- strength and sensation are intact   Labs:   Lab Results  Component Value Date   WBC 10.4 09/20/2012   HGB 13.6 09/20/2012   HCT 41.3 09/20/2012   MCV 90.6 09/20/2012   PLT  223.0 09/20/2012   No results found for this basename: NA, K, CL, CO2, BUN, CREATININE, CALCIUM, LABALBU, PROT, BILITOT, ALKPHOS, ALT, AST, GLUCOSE,  in the last 168 hours No results found for this basename: CKTOTAL, CKMB, CKMBINDEX, TROPONINI    FOLLOW UP PLANS AND APPOINTMENTS  Future Appointments [facility]el Franze Department Dept Phone   12/27/2012 2:45 PM Hillis Range, MD [facility] Main Office [place]) [phone]       Medication List    TAKE these medications       allopurinol 100 MG tablet  Commonly known as:  ZYLOPRIM  Take 100 mg by mouth 2 (two) times daily.     atorvastatin 10 MG tablet  Commonly known as:  LIPITOR  Take 10 mg by mouth daily.     CRANBERRY PO  Take 1 tablet by mouth daily.     diltiazem 240 MG 24 hr capsule  Commonly known as:  DILACOR XR  Take 240 mg by mouth daily.     dofetilide 500 MCG capsule  Commonly known as:  TIKOSYN  Take 500 mcg by mouth every 12 (twelve) hours.     eplerenone 50 MG tablet  Commonly known as:  INSPRA  Take 50 mg by mouth daily.     ibuprofen 200 MG tablet  Commonly known as:  ADVIL,MOTRIN  Take 200 mg by mouth every 6 (six) hours as needed for pain.     metoprolol tartrate 25 MG tablet  Commonly known as:  LOPRESSOR  Take 25 mg by mouth 2 (two) times  daily.     omeprazole 40 MG capsule  Commonly known as:  PRILOSEC  Take 40 mg by mouth every evening.     valsartan 40 MG tablet  Commonly known as:  DIOVAN  Take 40 mg by mouth daily.     warfarin 5 MG tablet  Commonly known as:  COUMADIN  Take 2.5-5 mg by mouth daily. Takes 1 tablet (5mg ) on Mondays, Wednesdays, and Fridays.  Takes  tablet (2.5mg ) on all remaining days.           Follow-up Information   Follow up with Hillis Range, MD On 12/27/2012. (at 2:45 pm)    Contact information:   [address] [place] [address] [phone]       BRING ALL MEDICATIONS WITH YOU TO FOLLOW UP APPOINTMENTS  Time spent with patient to include  physician time: 30 minutes Signed: Hillis Range, MD 09/29/2012, 8:55 AM

## 2012-09-29 NOTE — Progress Notes (Signed)
Pt given and explained d/c instructions.  Pt asked appropriate questions, Teach back used to assess learning.  Pt taken out to car in [place].    Eliane Decree, RN

## 2012-10-03 ENCOUNTER — Telehealth: Payer: Self-pay | Admitting: *Deleted

## 2012-10-03 NOTE — Telephone Encounter (Signed)
Patient called stating hs BP has been running 150-160/90-100 since the ablation.  Just wanted Dr Jenel Lucks opinion.  I have asked Dr Johney Frame and he thinks we should increase his Diovan to 80mg  daily.  When I called the patient back he says he is going to talk with his other doctors and let me know what they suggest.

## 2012-11-08 ENCOUNTER — Other Ambulatory Visit: Payer: Self-pay

## 2012-11-08 MED ORDER — DOFETILIDE 500 MCG PO CAPS: 500.0000 ug | ORAL_CAPSULE | Freq: Two times a day (BID) | ORAL | Status: DC

## 2012-12-20 ENCOUNTER — Encounter: Payer: Self-pay | Admitting: Internal Medicine

## 2012-12-20 ENCOUNTER — Ambulatory Visit (INDEPENDENT_AMBULATORY_CARE_PROVIDER_SITE_OTHER): Payer: Commercial Managed Care - PPO | Admitting: Internal Medicine

## 2012-12-20 VITALS — BP 123/80 | HR 55 | Ht 73.0 in | Wt 248.0 lb

## 2012-12-20 DIAGNOSIS — I4891 Unspecified atrial fibrillation: Secondary | ICD-10-CM

## 2012-12-20 DIAGNOSIS — I10 Essential (primary) hypertension: Secondary | ICD-10-CM

## 2012-12-20 NOTE — Patient Instructions (Addendum)
Your physician recommends that you schedule a follow-up appointment in 3 months with Dr Allred    

## 2012-12-20 NOTE — Progress Notes (Signed)
PCP: Darnelle Bos, MD Primary Cardiologist:  Dr Elmarie Mainland is a 57 y.o. male who presents today for routine electrophysiology followup.  Since his repeat afib ablation, the patient reports doing very well.  He denies procedure related complications.  He is unaware of any afib.  Today, he denies symptoms of palpitations, chest pain, shortness of breath,  lower extremity edema, dizziness, presyncope, or syncope.  The patient is otherwise without complaint today.   Past Medical History  Diagnosis Date  . Atrial fibrillation     paroxysmal  . Atrial flutter   . GERD (gastroesophageal reflux disease)   . Prostatitis   . Hypertension   . Pneumonia 1990's  . Arthritis   . Nephrolithiasis   . Renal insufficiency     "Cr 1.6"  . Kidney cysts     "multiple"  . Atrial enlargement, bilateral   . Adrenal cortical hyperfunction     ongoing evaluation for elevated R>L adosterone secretion   Past Surgical History  Procedure Laterality Date  . Schwannoma removal  ~01/2010    left thorax  . Cardioversion  07/28/2011    Procedure: CARDIOVERSION;  Surgeon: Lewayne Bunting, MD;  Location: [facility] OR;  Service: Cardiovascular;  Laterality: N/A;  . Inguinal hernia repair      "as a child; ? right"  . Lung biopsy  1993  . Double j stent placement  06/2004    left  . Umbilical hernia repair  07/2006  . Nasal septoplasty w/ turbinoplasty  07/2006  . Prostate biopsy  2011  . Atrial fibrillation ablation with cti ablation  05/27/11, 09/28/12    ablation for afib and atrial flutter by Dr Johney Frame  . Tee without cardioversion N/A 09/27/2012    Procedure: TRANSESOPHAGEAL ECHOCARDIOGRAM (TEE);  Surgeon: Laurey Morale, MD;  Location: [facility] ENDOSCOPY;  Service: Cardiovascular;  Laterality: N/A;    Current Outpatient Prescriptions  Medication Sig Dispense Refill  . allopurinol (ZYLOPRIM) 100 MG tablet Take 100 mg by mouth 2 (two) times daily.       Marland Kitchen atorvastatin (LIPITOR) 10 MG tablet Take 10 mg by  mouth daily.        Marland Kitchen CRANBERRY PO Take 1 tablet by mouth daily.      Marland Kitchen diltiazem (DILACOR XR) 240 MG 24 hr capsule Take 240 mg by mouth daily.      Marland Kitchen dofetilide (TIKOSYN) 500 MCG capsule Take 1 capsule (500 mcg total) by mouth every 12 (twelve) hours.  60 capsule  5  . eplerenone (INSPRA) 50 MG tablet Take 50 mg by mouth daily.      Marland Kitchen ibuprofen (ADVIL,MOTRIN) 200 MG tablet Take 200 mg by mouth every 6 (six) hours as needed for pain.      . metoprolol tartrate (LOPRESSOR) 25 MG tablet Take 25 mg by mouth 2 (two) times daily.        Marland Kitchen omeprazole (PRILOSEC) 40 MG capsule Take 40 mg by mouth every evening.      . valsartan (DIOVAN) 40 MG tablet Take 40 mg by mouth daily.      Marland Kitchen warfarin (COUMADIN) 5 MG tablet Take 2.5-5 mg by mouth daily. Takes 1 tablet (5mg ) on Mondays, Wednesdays, and Fridays.  Takes  tablet (2.5mg ) on all remaining days.       No current facility-administered medications for this visit.    Physical Exam: Filed Vitals:   12/20/12 0854  BP: 123/80  Pulse: 55  Height: 6\' 1"  (1.854 m)  Weight: 248  lb (112.492 kg)    GEN- The patient is well appearing, alert and oriented x 3 today.   Head- normocephalic, atraumatic Eyes-  Sclera clear, conjunctiva pink Ears- hearing intact Oropharynx- clear Lungs- Clear to ausculation bilaterally, normal work of breathing Heart- Regular rate and rhythm, no murmurs, rubs or gallops, PMI not laterally displaced GI- soft, NT, ND, + BS Extremities- no clubbing, cyanosis, or edema  ekg today reveals sinus rhythm 55 bpm, PR 282 msec, inferior infarction pattern  Assessment and Plan:  1. afib Doing very well s/p ablation He will wean slowly off of metoprolol No other changes  2. HTN Stable No change required today  Return in 3 months

## 2012-12-27 ENCOUNTER — Encounter: Payer: Commercial Managed Care - PPO | Admitting: Internal Medicine

## 2013-01-15 ENCOUNTER — Other Ambulatory Visit: Payer: Self-pay | Admitting: Dermatology

## 2013-02-28 ENCOUNTER — Encounter: Payer: Self-pay | Admitting: Internal Medicine

## 2013-02-28 ENCOUNTER — Ambulatory Visit (INDEPENDENT_AMBULATORY_CARE_PROVIDER_SITE_OTHER): Payer: Commercial Managed Care - PPO | Admitting: Internal Medicine

## 2013-02-28 VITALS — BP 124/80 | HR 55 | Ht 74.0 in | Wt 248.4 lb

## 2013-02-28 DIAGNOSIS — I10 Essential (primary) hypertension: Secondary | ICD-10-CM

## 2013-02-28 DIAGNOSIS — I4891 Unspecified atrial fibrillation: Secondary | ICD-10-CM

## 2013-02-28 NOTE — Patient Instructions (Addendum)
Your physician wants you to follow-up in: 6 months with Dr Jacquiline Doe will receive a reminder letter in the mail two months in advance. If you don't receive a letter, please call our office to schedule the follow-up appointment.  Your physician has recommended you make the following change in your medication:  1) Decrease Metoprolol to 12.5 twice daily for one month then stop

## 2013-02-28 NOTE — Progress Notes (Signed)
PCP: Darnelle Bos, MD Primary Cardiologist:  Dr Elmarie Mainland is a 57 y.o. male who presents today for routine electrophysiology followup.  Since his repeat afib ablation, the patient reports doing very well.  He is unaware of any sustained afib.  Today, he denies symptoms of palpitations, chest pain, shortness of breath,  lower extremity edema, dizziness, presyncope, or syncope.  The patient is otherwise without complaint today.   Past Medical History  Diagnosis Date  . Atrial fibrillation     paroxysmal  . Atrial flutter   . GERD (gastroesophageal reflux disease)   . Prostatitis   . Hypertension   . Pneumonia 1990's  . Arthritis   . Nephrolithiasis   . Renal insufficiency     "Cr 1.6"  . Kidney cysts     "multiple"  . Atrial enlargement, bilateral   . Adrenal cortical hyperfunction     ongoing evaluation for elevated R>L adosterone secretion   Past Surgical History  Procedure Laterality Date  . Schwannoma removal  ~01/2010    left thorax  . Cardioversion  07/28/2011    Procedure: CARDIOVERSION;  Surgeon: Lewayne Bunting, MD;  Location: [facility] OR;  Service: Cardiovascular;  Laterality: N/A;  . Inguinal hernia repair      "as a child; ? right"  . Lung biopsy  1993  . Double j stent placement  06/2004    left  . Umbilical hernia repair  07/2006  . Nasal septoplasty w/ turbinoplasty  07/2006  . Prostate biopsy  2011  . Atrial fibrillation ablation with cti ablation  05/27/11, 09/28/12    ablation for afib and atrial flutter by Dr Johney Frame  . Tee without cardioversion N/A 09/27/2012    Procedure: TRANSESOPHAGEAL ECHOCARDIOGRAM (TEE);  Surgeon: Laurey Morale, MD;  Location: [facility] ENDOSCOPY;  Service: Cardiovascular;  Laterality: N/A;    Current Outpatient Prescriptions  Medication Sig Dispense Refill  . allopurinol (ZYLOPRIM) 100 MG tablet Take 100 mg by mouth 2 (two) times daily.       Marland Kitchen atorvastatin (LIPITOR) 10 MG tablet Take 10 mg by mouth daily.        Marland Kitchen CRANBERRY  PO Take 1 tablet by mouth daily.      Marland Kitchen diltiazem (DILACOR XR) 240 MG 24 hr capsule Take 240 mg by mouth daily.      Marland Kitchen dofetilide (TIKOSYN) 500 MCG capsule Take 1 capsule (500 mcg total) by mouth every 12 (twelve) hours.  60 capsule  5  . eplerenone (INSPRA) 50 MG tablet Take 50 mg by mouth daily.      Marland Kitchen ibuprofen (ADVIL,MOTRIN) 200 MG tablet Take 200 mg by mouth every 6 (six) hours as needed for pain.      Marland Kitchen omeprazole (PRILOSEC) 40 MG capsule Take 40 mg by mouth every evening.      . valsartan (DIOVAN) 40 MG tablet Take 40 mg by mouth daily.      Marland Kitchen warfarin (COUMADIN) 5 MG tablet Take 2.5-5 mg by mouth daily. Takes 1 tablet (5mg ) on Mondays, Wednesdays, and Fridays.  Takes  tablet (2.5mg ) on all remaining days.       No current facility-administered medications for this visit.    Physical Exam: Filed Vitals:   02/28/13 0852  BP: 124/80  Pulse: 55  Height: 6\' 2"  (1.88 m)  Weight: 248 lb 6.4 oz (112.674 kg)    GEN- The patient is well appearing, alert and oriented x 3 today.   Head- normocephalic, atraumatic Eyes-  Sclera  clear, conjunctiva pink Ears- hearing intact Oropharynx- clear Lungs- Clear to ausculation bilaterally, normal work of breathing Heart- Regular rate and rhythm, no murmurs, rubs or gallops, PMI not laterally displaced GI- soft, NT, ND, + BS Extremities- no clubbing, cyanosis, or edema  ekg today reveals sinus rhythm 55 bpm, PR 254 msec, inferior infarction pattern, Qtc 524  Assessment and Plan:  1. afib Doing very well s/p ablation He will wean slowly off of metoprolol No other changes  2. HTN Stable No change required today  Return in 6 months

## 2013-05-16 ENCOUNTER — Other Ambulatory Visit: Payer: Self-pay | Admitting: Internal Medicine

## 2013-07-25 ENCOUNTER — Telehealth: Payer: Self-pay | Admitting: Internal Medicine

## 2013-07-25 NOTE — Telephone Encounter (Signed)
Left message for patient to return my call in regards to medication.  He is going to take Augmentin

## 2013-07-25 NOTE — Telephone Encounter (Signed)
New problem    Need to discuss antibiotic.

## 2013-08-29 ENCOUNTER — Ambulatory Visit (INDEPENDENT_AMBULATORY_CARE_PROVIDER_SITE_OTHER): Payer: PRIVATE HEALTH INSURANCE | Admitting: Internal Medicine

## 2013-08-29 ENCOUNTER — Encounter: Payer: Self-pay | Admitting: Internal Medicine

## 2013-08-29 VITALS — BP 124/78 | HR 67 | Ht 74.0 in | Wt 245.0 lb

## 2013-08-29 DIAGNOSIS — I10 Essential (primary) hypertension: Secondary | ICD-10-CM

## 2013-08-29 DIAGNOSIS — I4891 Unspecified atrial fibrillation: Secondary | ICD-10-CM

## 2013-08-29 NOTE — Progress Notes (Signed)
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  Since his last visit, the patient reports doing very well.  He is unaware of any sustained afib.  Today, he denies symptoms of palpitations, chest pain, shortness of breath,  lower extremity edema, dizziness, presyncope, or syncope.  The patient is otherwise without complaint today.   Past Medical History  Diagnosis Date  . Atrial fibrillation     paroxysmal  . Atrial flutter   . GERD (gastroesophageal reflux disease)   . Prostatitis   . Hypertension   . Pneumonia 1990's  . Arthritis   . Nephrolithiasis   . Renal insufficiency     "Cr 1.6"  . Kidney cysts     "multiple"  . Atrial enlargement, bilateral   . Adrenal cortical hyperfunction     ongoing evaluation for elevated R>L adosterone secretion   Past Surgical History  Procedure Laterality Date  . Schwannoma removal  ~01/2010    left thorax  . Cardioversion  07/28/2011    Procedure: CARDIOVERSION;  Surgeon: Lelon Perla, MD;  Location: [facility];  Service: Cardiovascular;  Laterality: N/A;  . Inguinal hernia repair      "as a child; ? right"  . Lung biopsy  1993  . Double j stent placement  06/2004    left  . Umbilical hernia repair  07/2006  . Nasal septoplasty w/ turbinoplasty  07/2006  . Prostate biopsy  2011  . Atrial fibrillation ablation with cti ablation  05/27/11, 09/28/12    ablation for afib and atrial flutter by Dr Rayann Heman  . Tee without cardioversion N/A 09/27/2012    Procedure: TRANSESOPHAGEAL ECHOCARDIOGRAM (TEE);  Surgeon: Larey Dresser, MD;  Location: [facility] ENDOSCOPY;  Service: Cardiovascular;  Laterality: N/A;    Current Outpatient Prescriptions  Medication Sig Dispense Refill  . allopurinol (ZYLOPRIM) 100 MG tablet Take 100 mg by mouth 2 (two) times daily.       Marland Kitchen atorvastatin (LIPITOR) 10 MG tablet Take 10 mg by mouth daily.        Marland Kitchen CRANBERRY PO Take 1  tablet by mouth daily.      Marland Kitchen diltiazem (DILACOR XR) 240 MG 24 hr capsule Take 240 mg by mouth daily.      Marland Kitchen eplerenone (INSPRA) 50 MG tablet Take 50 mg by mouth daily.      Marland Kitchen ibuprofen (ADVIL,MOTRIN) 200 MG tablet Take 200 mg by mouth every 6 (six) hours as needed for pain.      Marland Kitchen omeprazole (PRILOSEC) 40 MG capsule Take 40 mg by mouth every evening.      . valsartan (DIOVAN) 40 MG tablet Take 40 mg by mouth daily.      Marland Kitchen warfarin (COUMADIN) 5 MG tablet Take 2.5-5 mg by mouth daily. Takes 1 tablet (5mg ) on Mondays, Wednesdays, and Fridays.  Takes  tablet (2.5mg ) on all remaining days.       No current facility-administered medications for this visit.    Physical Exam: Filed Vitals:   08/29/13 0941  BP: 124/78  Pulse: 67  Height: 6\' 2"  (1.88 m)  Weight: 245 lb (111.131 kg)    GEN- The patient is well appearing, alert and oriented x 3 today.   Head- normocephalic, atraumatic Eyes-  Sclera clear, conjunctiva pink Ears- hearing intact Oropharynx- clear Lungs- Clear to ausculation bilaterally, normal work of breathing Heart- Regular rate and rhythm, no murmurs, rubs or gallops,  PMI not laterally displaced GI- soft, NT, ND, + BS Extremities- no clubbing, cyanosis, or edema  ekg today reveals sinus rhythm 67 bpm, PR 256 msec, inferior infarction pattern, Qtc 488  Assessment and Plan:  1. afib Doing very well s/p ablation Stop tikosyn chads2vasc score is 1.  He would prefer to continue coumadin for now.  2. HTN Stable No change required today  Return in 6 months

## 2013-08-29 NOTE — Patient Instructions (Signed)
Your physician wants you to follow-up in: 6 months with Dr Vallery Ridge will receive a reminder letter in the mail two months in advance. If you don't receive a letter, please call our office to schedule the follow-up appointment.  Your physician has recommended you make the following change in your medication:  1) Stop Tikosyn

## 2013-09-26 ENCOUNTER — Encounter: Payer: Self-pay | Admitting: Internal Medicine

## 2013-10-30 ENCOUNTER — Other Ambulatory Visit: Payer: Self-pay | Admitting: Internal Medicine

## 2013-11-06 ENCOUNTER — Telehealth: Payer: Self-pay | Admitting: *Deleted

## 2013-11-06 NOTE — Telephone Encounter (Signed)
Saw him in the past for a pair of orthotic foot beds.  I have some questions.  I attempted to return his call and left him a message for a return call.

## 2013-11-07 NOTE — Telephone Encounter (Addendum)
I attempted to return the patient's call again.  I left another message for a return call.  He returned my call requested I call him back.  I attempted to call him again got voicemail.  I left another message.  I called the patient back.  He asked if it's possible that the inserts can not be effective after a year.  I informed him yes.  He said how can you tell.  I told him patients start to notice pain in their feet.  He stated you can't tell just by looking at them I told him no.  I told him we can send them to be refurbished.  He inquired about a new pair.  I told him we're using a different company now so he would need to see his doctor first then we could scan him.  I told him once he gets the new pair we can send the old ones to be refurbished.  I transferred him to a scheduler.

## 2013-11-14 ENCOUNTER — Encounter: Payer: Self-pay | Admitting: *Deleted

## 2013-11-14 DIAGNOSIS — M722 Plantar fascial fibromatosis: Secondary | ICD-10-CM

## 2013-11-14 NOTE — Progress Notes (Unsigned)
Ordered 2nd pair of orthotics with [employer] at patient's request.

## 2013-11-27 ENCOUNTER — Encounter: Payer: Self-pay | Admitting: Podiatry

## 2014-03-06 ENCOUNTER — Ambulatory Visit (INDEPENDENT_AMBULATORY_CARE_PROVIDER_SITE_OTHER): Payer: PRIVATE HEALTH INSURANCE | Admitting: Internal Medicine

## 2014-03-06 ENCOUNTER — Encounter: Payer: Self-pay | Admitting: Internal Medicine

## 2014-03-06 VITALS — BP 136/78 | HR 71 | Ht 73.5 in | Wt 241.0 lb

## 2014-03-06 DIAGNOSIS — I10 Essential (primary) hypertension: Secondary | ICD-10-CM

## 2014-03-06 DIAGNOSIS — I4891 Unspecified atrial fibrillation: Secondary | ICD-10-CM

## 2014-03-06 NOTE — Patient Instructions (Signed)
Your physician wants you to follow-up in: 1 year with Dr. Allred. You will receive a reminder letter in the mail two months in advance. If you don't receive a letter, please call our office to schedule the follow-up appointment.  Your physician recommends that you continue on your current medications as directed. Please refer to the Current Medication list given to you today.  

## 2014-03-06 NOTE — Progress Notes (Signed)
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  Since his last visit, the patient reports doing very well.  He is unaware of any afib.  Today, he denies symptoms of palpitations, chest pain, shortness of breath,  lower extremity edema, dizziness, presyncope, or syncope.  The patient is otherwise without complaint today.   Past Medical History  Diagnosis Date  . Atrial fibrillation     paroxysmal  . Atrial flutter   . GERD (gastroesophageal reflux disease)   . Prostatitis   . Hypertension   . Pneumonia 1990's  . Arthritis   . Nephrolithiasis   . Renal insufficiency     "Cr 1.6"  . Kidney cysts     "multiple"  . Atrial enlargement, bilateral   . Adrenal cortical hyperfunction     ongoing evaluation for elevated R>L adosterone secretion   Past Surgical History  Procedure Laterality Date  . Schwannoma removal  ~01/2010    left thorax  . Cardioversion  07/28/2011    Procedure: CARDIOVERSION;  Surgeon: Lelon Perla, MD;  Location: [facility];  Service: Cardiovascular;  Laterality: N/A;  . Inguinal hernia repair      "as a child; ? right"  . Lung biopsy  1993  . Double j stent placement  06/2004    left  . Umbilical hernia repair  07/2006  . Nasal septoplasty w/ turbinoplasty  07/2006  . Prostate biopsy  2011  . Atrial fibrillation ablation with cti ablation  05/27/11, 09/28/12    ablation for afib and atrial flutter by Dr Rayann Heman  . Tee without cardioversion N/A 09/27/2012    Procedure: TRANSESOPHAGEAL ECHOCARDIOGRAM (TEE);  Surgeon: Larey Dresser, MD;  Location: [facility] ENDOSCOPY;  Service: Cardiovascular;  Laterality: N/A;    Current Outpatient Prescriptions  Medication Sig Dispense Refill  . allopurinol (ZYLOPRIM) 100 MG tablet Take 100 mg by mouth 2 (two) times daily.       Marland Kitchen atorvastatin (LIPITOR) 10 MG tablet Take 10 mg by mouth daily.        Marland Kitchen CRANBERRY PO Take 1 tablet by  mouth daily.      Marland Kitchen diltiazem (DILACOR XR) 240 MG 24 hr capsule Take 240 mg by mouth daily.      Marland Kitchen eplerenone (INSPRA) 50 MG tablet Take 50 mg by mouth daily.      Marland Kitchen ibuprofen (ADVIL,MOTRIN) 200 MG tablet Take 200 mg by mouth every 6 (six) hours as needed for pain.      Marland Kitchen omeprazole (PRILOSEC) 40 MG capsule Take 40 mg by mouth every evening.      . valsartan (DIOVAN) 40 MG tablet Take 40 mg by mouth daily.      Marland Kitchen warfarin (COUMADIN) 5 MG tablet Take 2.5-5 mg by mouth daily. Takes 1 tablet (5mg ) on Mondays, Wednesdays, and Fridays.  Takes  tablet (2.5mg ) on all remaining days.       No current facility-administered medications for this visit.    Physical Exam: Filed Vitals:   03/06/14 0815  BP: 136/78  Pulse: 71  Height: 6' 1.5" (1.867 m)  Weight: 241 lb (109.317 kg)    GEN- The patient is well appearing, alert and oriented x 3 today.   Head- normocephalic, atraumatic Eyes-  Sclera clear, conjunctiva pink Ears- hearing intact Oropharynx- clear Lungs- Clear to ausculation bilaterally, normal work of breathing Heart- Regular rate and rhythm, no murmurs, rubs or gallops, PMI  not laterally displaced GI- soft, NT, ND, + BS Extremities- no clubbing, cyanosis, trace edema  ekg today reveals sinus rhythm, PR 256 msec, inferior infarction pattern   Assessment and Plan:  1. afib Doing very well s/p ablation off of AAD therapy chads2vasc score is 1.  Stop coumadin today  2. HTN Stable No change required today  Return in 12 months

## 2014-07-04 ENCOUNTER — Encounter (HOSPITAL_COMMUNITY): Payer: Self-pay | Admitting: Internal Medicine

## 2014-12-03 ENCOUNTER — Other Ambulatory Visit: Payer: Self-pay | Admitting: Gastroenterology

## 2015-02-25 ENCOUNTER — Encounter (HOSPITAL_COMMUNITY): Payer: Self-pay | Admitting: *Deleted

## 2015-02-28 ENCOUNTER — Other Ambulatory Visit: Payer: Self-pay | Admitting: Gastroenterology

## 2015-03-03 ENCOUNTER — Ambulatory Visit (HOSPITAL_COMMUNITY): Payer: PRIVATE HEALTH INSURANCE | Admitting: Anesthesiology

## 2015-03-03 ENCOUNTER — Ambulatory Visit (HOSPITAL_COMMUNITY)
Admission: RE | Admit: 2015-03-03 | Discharge: 2015-03-03 | Disposition: A | Discharge status: Home / Self Care | Payer: PRIVATE HEALTH INSURANCE | Source: Ambulatory Visit | Attending: Gastroenterology | Admitting: Gastroenterology

## 2015-03-03 ENCOUNTER — Encounter (HOSPITAL_COMMUNITY)
Admission: RE | Disposition: A | Discharge status: Home / Self Care | Payer: Self-pay | Source: Ambulatory Visit | Attending: Gastroenterology

## 2015-03-03 ENCOUNTER — Encounter (HOSPITAL_COMMUNITY): Payer: Self-pay | Admitting: Anesthesiology

## 2015-03-03 DIAGNOSIS — Z6830 Body mass index (BMI) 30.0-30.9, adult: Secondary | ICD-10-CM | POA: Insufficient documentation

## 2015-03-03 DIAGNOSIS — K621 Rectal polyp: Secondary | ICD-10-CM | POA: Insufficient documentation

## 2015-03-03 DIAGNOSIS — K635 Polyp of colon: Secondary | ICD-10-CM | POA: Diagnosis not present

## 2015-03-03 DIAGNOSIS — K317 Polyp of stomach and duodenum: Secondary | ICD-10-CM | POA: Diagnosis not present

## 2015-03-03 DIAGNOSIS — I4891 Unspecified atrial fibrillation: Secondary | ICD-10-CM | POA: Insufficient documentation

## 2015-03-03 DIAGNOSIS — M109 Gout, unspecified: Secondary | ICD-10-CM | POA: Diagnosis not present

## 2015-03-03 DIAGNOSIS — Z1211 Encounter for screening for malignant neoplasm of colon: Secondary | ICD-10-CM | POA: Diagnosis not present

## 2015-03-03 DIAGNOSIS — I10 Essential (primary) hypertension: Secondary | ICD-10-CM | POA: Insufficient documentation

## 2015-03-03 DIAGNOSIS — K219 Gastro-esophageal reflux disease without esophagitis: Secondary | ICD-10-CM | POA: Diagnosis not present

## 2015-03-03 DIAGNOSIS — E669 Obesity, unspecified: Secondary | ICD-10-CM | POA: Diagnosis not present

## 2015-03-03 DIAGNOSIS — R6881 Early satiety: Secondary | ICD-10-CM | POA: Diagnosis present

## 2015-03-03 HISTORY — PX: ESOPHAGOGASTRODUODENOSCOPY (EGD) WITH PROPOFOL: SHX5813

## 2015-03-03 HISTORY — PX: COLONOSCOPY WITH PROPOFOL: SHX5780

## 2015-03-03 LAB — GLUCOSE, CAPILLARY: Glucose-Capillary: 87 mg/dL (ref 65–99)

## 2015-03-03 SURGERY — COLONOSCOPY WITH PROPOFOL
Anesthesia: Monitor Anesthesia Care

## 2015-03-03 MED ORDER — PROPOFOL 10 MG/ML IV BOLUS
INTRAVENOUS | Status: AC
  Filled 2015-03-03: qty 20

## 2015-03-03 MED ORDER — GLYCOPYRROLATE 0.2 MG/ML IJ SOLN
INTRAMUSCULAR | Status: AC
  Filled 2015-03-03: qty 2

## 2015-03-03 MED ORDER — PROPOFOL INFUSION 10 MG/ML OPTIME
INTRAVENOUS | Status: DC | PRN
  Administered 2015-03-03: 250 ug/kg/min via INTRAVENOUS

## 2015-03-03 MED ORDER — GLYCOPYRROLATE 0.2 MG/ML IJ SOLN
INTRAMUSCULAR | Status: DC | PRN
  Administered 2015-03-03: 0.2 mg via INTRAVENOUS

## 2015-03-03 MED ORDER — PROPOFOL 10 MG/ML IV BOLUS
INTRAVENOUS | Status: AC
Start: 2015-03-03 — End: 2015-03-03
  Filled 2015-03-03: qty 20

## 2015-03-03 MED ORDER — EPHEDRINE SULFATE 50 MG/ML IJ SOLN
INTRAMUSCULAR | Status: DC | PRN
  Administered 2015-03-03 (×2): 5 mg via INTRAVENOUS

## 2015-03-03 MED ORDER — BUTAMBEN-TETRACAINE-BENZOCAINE 2-2-14 % EX AERO
INHALATION_SPRAY | CUTANEOUS | Status: DC | PRN
  Administered 2015-03-03: 1 via TOPICAL

## 2015-03-03 MED ORDER — LACTATED RINGERS IV SOLN
INTRAVENOUS | Status: DC
  Administered 2015-03-03: 1000 mL via INTRAVENOUS

## 2015-03-03 MED ORDER — SODIUM CHLORIDE 0.9 % IV SOLN: INTRAVENOUS | Status: DC

## 2015-03-03 SURGICAL SUPPLY — 22 items

## 2015-03-03 NOTE — Op Note (Signed)
Problem: Early satiety and gastroesophageal reflux. 06/24/2004 colonoscopy performed with removal of a 2 mm adenomatous sigmoid colon polyp. 07/20/2009 normal surveillance colonoscopy  Endoscopist: Earle Gell  Premedication: Propofol administered by anesthesia  Procedure: Diagnostic esophagogastroduodenoscopy with duodenal polyp removal Dr. Jodi Marble was placed in the left lateral decubitus position. The Pentax gastroscope was passed through the posterior hypopharynx into the proximal esophagus without difficulty. The hypopharynx, larynx, and vocal cords appeared normal.  Esophagoscopy: The proximal, mid, and lower segments of the esophageal mucosa appeared completely normal. The squamocolumnar junction appeared regular and noted at 45 cm from the incisor teeth. There was no endoscopic evidence for the presence of erosive esophagitis or Barrett's esophagus.  Gastroscopy: Retroflex view of the gastric cardia and fundus was normal. The gastric body, antrum, and pylorus appeared normal. The pylorus was patent.  Duodenoscopy: The duodenal bulb appeared normal. Tangential view of the major papilla appeared normal. Distal to the major papilla was a 5 mm pedunculated polyp which was removed with the cold biopsy forceps. An Endo Clip was applied to the proximal polyp stalk to prevent bleeding. Mucosa in the descending duodenum appeared normal.  Assessment:  #1. A 5 mm pedunculated polyp was removed from the second portion of the duodenum distal to the major papilla.  #2. Otherwise normal esophagogastroduodenoscopy  Procedure: Surveillance colonoscopy Anal inspection and digital rectal exam were normal. The Pentax pediatric colonoscope was introduced into the rectum and advanced to the cecum. A normal-appearing appendiceal orifice was identified. A normal-appearing ileocecal valve was intubated and the terminal ileum inspected. Colonic preparation for the exam today was good. Withdrawal time was  17 minutes  Rectum. From the distal rectum, a 3 mm sessile polyp was removed with the cold biopsy forceps. Retroflex view of the distal rectum was normal.  Sigmoid colon. Two 3 mm sessile polyps were removed from the distal sigmoid colon with the cold biopsy forceps  Descending colon. Normal  Splenic flexure. Normal  Transverse colon. Normal  Hepatic flexure. Normal  Ascending colon. Normal  Cecum and ileocecal valve. Normal  Terminal ileum. Normal  Assessment: Two diminutive polyps were removed from the distal sigmoid colon and a diminutive polyp was removed from the distal rectum. Otherwise normal surveillance colonoscopy.  Recommendation: Schedule surveillance colonoscopy in 5 years.

## 2015-03-03 NOTE — Transfer of Care (Signed)
Immediate Anesthesia Transfer of Care Note  Patient: Casey [name] [facility]  Procedure(s) Performed: Procedure(s): COLONOSCOPY WITH PROPOFOL (N/A) ESOPHAGOGASTRODUODENOSCOPY (EGD) WITH PROPOFOL (N/A)  Patient Location: PACU  Anesthesia Type:MAC  Level of Consciousness:  sedated, patient cooperative and responds to stimulation  Airway & Oxygen Therapy:Patient Spontanous Breathing and Patient connected to face mask oxgen  Post-op Assessment:  Report given to PACU RN and Post -op Vital signs reviewed and stable  Post vital signs:  Reviewed and stable  Last Vitals:  Filed Vitals:   03/03/15 0815  BP: 168/87  Pulse: 62  Temp: 36.7 C  Resp: 23    Complications: No apparent anesthesia complications

## 2015-03-03 NOTE — H&P (Signed)
  Problems: 06/24/2004 colonoscopy performed with removal of a 2 mm adenomatous sigmoid colon polyp. 07/20/2009 normal surveillance colonoscopy. Early satiety and chronic gastroesophageal reflux.  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 In November 2005, he underwent a screening colonoscopy with removal of a diminutive adenomatous sigmoid colon polyp. In December 2010, he underwent a normal surveillance colonoscopy. He is scheduled to undergo a repeat surveillance colonoscopy today  He has chronic gastroesophageal reflux unassociated with dysphagia or odynophagia. He has developed early satiety unassociated with nausea or vomiting. He has lost approximately 10 pounds in body weight. He denies diarrhea or a family history of celiac disease. He describes his bowel movements as loose.  He is scheduled to undergo a diagnostic esophagogastroduodenoscopy to evaluate early satiety and chronic gastroesophageal reflux  Past medical history: Cardiac ablation for atrial fibrillation. Benign schwannoma surgery performed in June 2011. Septoplasty. Umbilical herniorrhaphy. Lung biopsy. Vasectomy. Hypertension. Uric acid kidney stone. Plantar fasciitis. Basal cell skin cancer removed. Gastroesophageal reflux.  Medication allergies: Lisinopril causes cough. Sulfa drugs cause rash. Spironolactone caused gynecomastia.  Exam: The patient is alert and lying comfortably on the endoscopy stretcher. Abdomen is soft and nontender to palpation. Lungs are clear to auscultation. Cardiac exam reveals a regular rhythm.  Plan: Proceed with diagnostic esophagogastroduodenoscopy to evaluate early satiety and chronic gastroesophageal reflux. Proceed with surveillance colonoscopy.

## 2015-03-03 NOTE — Discharge Instructions (Signed)
Colonoscopy, Care After Refer to this sheet in the next few weeks. These instructions provide you with information on caring for yourself after your procedure. Your health care provider may also give you more specific instructions. Your treatment has been planned according to current medical practices, but problems sometimes occur. Call your health care provider if you have any problems or questions after your procedure. WHAT TO EXPECT AFTER THE PROCEDURE  After your procedure, it is typical to have the following:  A small amount of blood in your stool.  Moderate amounts of gas and mild abdominal cramping or bloating. HOME CARE INSTRUCTIONS  Do not drive, operate machinery, or sign important documents for 24 hours.  You may shower and resume your regular physical activities, but move at a slower pace for the first 24 hours.  Take frequent rest periods for the first 24 hours.  Walk around or put a warm pack on your abdomen to help reduce abdominal cramping and bloating.  Drink enough fluids to keep your urine clear or pale yellow.  You may resume your normal diet as instructed by your health care provider. Avoid heavy or fried foods that are hard to digest.  Avoid drinking alcohol for 24 hours or as instructed by your health care provider.  Only take over-the-counter or prescription medicines as directed by your health care provider.  If a tissue sample (biopsy) was taken during your procedure:  Do not take aspirin or blood thinners for 7 days, or as instructed by your health care provider.  Do not drink alcohol for 7 days, or as instructed by your health care provider.  Eat soft foods for the first 24 hours. SEEK MEDICAL CARE IF: You have persistent spotting of blood in your stool 2-3 days after the procedure. SEEK IMMEDIATE MEDICAL CARE IF:  You have more than a small spotting of blood in your stool.  You pass large blood clots in your stool.  Your abdomen is swollen  (distended).  You have nausea or vomiting.  You have a fever.  You have increasing abdominal pain that is not relieved with medicine. Document Released: 02/24/2004 Document Revised: 05/02/2013 Document Reviewed: 03/19/2013 [facility] Patient Information 2015 [place], [place]. This information is not intended to replace advice given to you by your health care provider. Make sure you discuss any questions you have with your health care provider.    Esophagogastroduodenoscopy Care After Refer to this sheet in the next few weeks. These instructions provide you with information on caring for yourself after your procedure. Your caregiver may also give you more specific instructions. Your treatment has been planned according to current medical practices, but problems sometimes occur. Call your caregiver if you have any problems or questions after your procedure.  HOME CARE INSTRUCTIONS  Do not eat or drink anything until the numbing medicine (local anesthetic) has worn off and your gag reflex has returned. You will know that the local anesthetic has worn off when you can swallow comfortably.  Do not drive for 12 hours after the procedure or as directed by your caregiver.  Only take medicines as directed by your caregiver. SEEK MEDICAL CARE IF:   You cannot stop coughing.  You are not urinating at all or less than usual. SEEK IMMEDIATE MEDICAL CARE IF:  You have difficulty swallowing.  You cannot eat or drink.  You have worsening throat or chest pain.  You have dizziness, lightheadedness, or you faint.  You have nausea or vomiting.  You have chills.  You have a fever.  You have severe abdominal pain.  You have black, tarry, or bloody stools. Document Released: 06/28/2012 Document Reviewed: 06/28/2012 [facility] Patient Information 2015 [place]. This information is not intended to replace advice given to you by your health care provider. Make sure you discuss any questions  you have with your health care provider.  Monitored Anesthesia Care Monitored anesthesia care is an anesthesia service for a medical procedure. Anesthesia is the loss of the ability to feel pain. It is produced by medicines called anesthetics. It may affect a small area of your body (local anesthesia), a large area of your body (regional anesthesia), or your entire body (general anesthesia). The need for monitored anesthesia care depends your procedure, your condition, and the potential need for regional or general anesthesia. It is often provided during procedures where:   General anesthesia may be needed if there are complications. This is because you need special care when you are under general anesthesia.   You will be under local or regional anesthesia. This is so that you are able to have higher levels of anesthesia if needed.   You will receive calming medicines (sedatives). This is especially the case if sedatives are given to put you in a semi-conscious state of relaxation (deep sedation). This is because the amount of sedative needed to produce this state can be hard to predict. Too much of a sedative can produce general anesthesia. Monitored anesthesia care is performed by one or more health care providers who have special training in all types of anesthesia. You will need to meet with these health care providers before your procedure. During this meeting, they will ask you about your medical history. They will also give you instructions to follow. (For example, you will need to stop eating and drinking before your procedure. You may also need to stop or change medicines you are taking.) During your procedure, your health care providers will stay with you. They will:   Watch your condition. This includes watching your blood pressure, breathing, and level of pain.   Diagnose and treat problems that occur.   Give medicines if they are needed. These may include calming medicines  (sedatives) and anesthetics.   Make sure you are comfortable.  Having monitored anesthesia care does not necessarily mean that you will be under anesthesia. It does mean that your health care providers will be able to manage anesthesia if you need it or if it occurs. It also means that you will be able to have a different type of anesthesia than you are having if you need it. When your procedure is complete, your health care providers will continue to watch your condition. They will make sure any medicines wear off before you are allowed to go home.  Document Released: 04/07/2005 Document Revised: 11/26/2013 Document Reviewed: 08/23/2012 [facility] Patient Information 2015 [place], [place]. This information is not intended to replace advice given to you by your health care provider. Make sure you discuss any questions you have with your health care provider.

## 2015-03-03 NOTE — Anesthesia Preprocedure Evaluation (Addendum)
Anesthesia Evaluation  Patient identified by MRN, date of birth, ID band Patient awake    Reviewed: Allergy & Precautions, H&P , NPO status , Patient's Chart, lab work & pertinent test results  History of Anesthesia Complications Negative for: history of anesthetic complications  Airway Mallampati: III  TM Distance: >3 FB Neck ROM: Full    Dental  (+) Teeth Intact, Dental Advisory Given   Pulmonary pneumonia -, resolved,    Pulmonary exam normal       Cardiovascular hypertension, Pt. on medications and Pt. on home beta blockers Normal cardiovascular exam+ dysrhythmias (Patient has hx of afib s/p cardioversion x2 and ablation procedure 2-3 years ago, no a-fib since, now off anticoagulation)  02/07/12 echo Study Conclusions  - Left ventricle: The cavity size was normal. Wall thickness   was normal. The estimated ejection fraction was 55%. Wall   motion was normal; there were no regional wall motion   abnormalities. Doppler parameters are consistent with   abnormal left ventricular relaxation (grade 1 diastolic   dysfunction). - Aortic valve: There was no stenosis. - Aorta: Mildly dilated aortic root. Aortic root dimension:   46mm (ED). - Mitral valve: No significant regurgitation. - Left atrium: The atrium was mildly dilated. Volume 54 mL. - Right ventricle: The cavity size was normal. Systolic   function was normal. - Tricuspid valve: Peak RV-RA gradient: 73mm Hg (S). - Pulmonary arteries: PA peak pressure: 69mm Hg (S). - Inferior vena cava: The vessel was normal in size; the   respirophasic diameter changes were in the normal range (=   50%); findings are consistent with normal central venous   pressure. Impressions:  - Normal LV size and systolic function, EF 41%. Normal RV   size and systolic function. Mildly dilated aortic root.   Mild left atrial enlargement. No significant valvular   abnormalities.      Neuro/Psych negative neurological ROS  negative psych ROS   GI/Hepatic Neg liver ROS, GERD-  Medicated and Controlled,  Endo/Other  Adrenal cortical hyperfunction  Renal/GU Renal InsufficiencyRenal disease (Cr 1.6)  negative genitourinary   Musculoskeletal negative musculoskeletal ROS (+)   Abdominal (+) + obese,   Peds  Hematology gout   Anesthesia Other Findings   Reproductive/Obstetrics                         Anesthesia Physical  Anesthesia Plan  ASA: II  Anesthesia Plan: MAC   Post-op Pain Management:    Induction: Intravenous  Airway Management Planned: Nasal Cannula  Additional Equipment:   Intra-op Plan:   Post-operative Plan: Extubation in OR  Informed Consent: I have reviewed the patients History and Physical, chart, labs and discussed the procedure including the risks, benefits and alternatives for the proposed anesthesia with the patient or authorized representative who has indicated his/her understanding and acceptance.     Plan Discussed with: CRNA, Anesthesiologist and Surgeon  Anesthesia Plan Comments:        Anesthesia Quick Evaluation

## 2015-03-03 NOTE — Anesthesia Postprocedure Evaluation (Signed)
  Anesthesia Post-op Note  Patient: Casey [name] [facility]  Procedure(s) Performed: Procedure(s) (LRB): COLONOSCOPY WITH PROPOFOL (N/A) ESOPHAGOGASTRODUODENOSCOPY (EGD) WITH PROPOFOL (N/A)  Patient Location: PACU  Anesthesia Type: MAC  Level of Consciousness: awake and alert   Airway and Oxygen Therapy: Patient Spontanous Breathing  Post-op Pain: mild  Post-op Assessment: Post-op Vital signs reviewed, Patient's Cardiovascular Status Stable, Respiratory Function Stable, Patent Airway and No signs of Nausea or vomiting  Last Vitals:  Filed Vitals:   03/03/15 0956  BP:   Pulse: 66  Temp: 36.9 C  Resp: 24    Post-op Vital Signs: stable   Complications: No apparent anesthesia complications

## 2015-03-04 ENCOUNTER — Encounter (HOSPITAL_COMMUNITY): Payer: Self-pay | Admitting: Gastroenterology

## 2016-05-11 ENCOUNTER — Encounter (HOSPITAL_COMMUNITY): Payer: Self-pay | Admitting: Psychiatry

## 2016-05-11 ENCOUNTER — Ambulatory Visit (INDEPENDENT_AMBULATORY_CARE_PROVIDER_SITE_OTHER): Payer: Managed Care, Other (non HMO) | Admitting: Psychiatry

## 2016-05-11 VITALS — BP 157/90 | HR 81 | Ht 73.0 in | Wt 239.0 lb

## 2016-05-11 DIAGNOSIS — F1099 Alcohol use, unspecified with unspecified alcohol-induced disorder: Secondary | ICD-10-CM

## 2016-05-11 DIAGNOSIS — Z79899 Other long term (current) drug therapy: Secondary | ICD-10-CM

## 2016-05-11 DIAGNOSIS — F4321 Adjustment disorder with depressed mood: Secondary | ICD-10-CM

## 2016-05-11 MED ORDER — SERTRALINE HCL 50 MG PO TABS: ORAL_TABLET | ORAL | 0 refills | Status: DC

## 2016-05-11 NOTE — Progress Notes (Signed)
Psychiatric Initial Adult Assessment   Patient Identification: Casey [name] MRN:  [id number] Date of Evaluation:  05/11/2016 Referral Source: Dr. Dwyane Dee Chief Complaint:  "That was stupid" Visit Diagnosis:    ICD-9-CM ICD-10-CM   1. Adjustment disorder with depressed mood 309.0 F43.21     History of Present Illness:   Casey [name] is  60 year old male with no psychiatry history, Afib, GERD, hypertension, renal insufficiency who presented for mental health evaluation.   He states that he was advised by the chief to present here for evaluation due to his statement last Thursday. He states that he has been feeling frustrated about medical supply and interaction with other staff. He then told to one of his nurse that "I need to shoot somebody, but I don't know who." He adamantly denies any HI intent or plan, but states that it was an expression of his frustration. He denies any gun access. He denies any past episode of violence or made threats to others, although he states that he might have been loud these days. He states that he feels "stupid" to have said that statement, especially nowadays when there are many concern about terrorism and violence. 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 He denies significant difficulty at work. Although he usually enjoys interaction with patients, he can sometimes be more direct when he feels tired. He reports mild anhedonia and thinks that he might not be lively as he used to these days. He reports great support from his wife. Of note, his wife told him that he has not been cheerful as he used to per report.  He sleeps 7 hours, although he has slight insomnia after this incident happened. He reports good appetite. He denies any SI, HI, AH, VH. He denies anxiety. He denies decreased need for sleep. He drink 2-3 beer or  liquor per week, and denies drug use  Per [place] controlled substance database:  None  Associated Signs/Symptoms: Depression Symptoms:  depressed mood, anhedonia, insomnia, fatigue, (Hypo) Manic Symptoms:  denies Anxiety Symptoms:  denies Psychotic Symptoms:  denies PTSD Symptoms: denies  Past Psychiatric History:  Outpatient: used to see a therapist, 20 years ago in the setting of divorce and having challenge with his child Psychiatry admission: denies Previous suicide attempt: denies Past trials of medication: denies  Previous Psychotropic Medications: No   Substance Abuse History in the last 12 months:  No.  Consequences of Substance Abuse: NA  Past Medical History:  Past Medical History:  Diagnosis Date  . 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HCC)    ongoing evaluation for elevated R>L adosterone secretion  . Arthritis   . Atrial enlargement, bilateral   . Atrial fibrillation (HCC)    paroxysmal  . Atrial flutter ([place])   . GERD (gastroesophageal reflux disease)   . Hypertension   . Kidney cysts    "multiple"  . Nephrolithiasis   . Pneumonia 1990's  . Prostatitis   . Renal insufficiency    "Cr 1.6"    Past Surgical History:  Procedure Laterality Date  . ATRIAL FIBRILLATION ABLATION N/A 09/28/2012   Procedure: ATRIAL FIBRILLATION ABLATION;  Surgeon: Thompson Grayer, MD;  Location: [facility] CATH LAB;  Service: Cardiovascular;  Laterality: N/A;  . atrial fibrillation ablation with CTI ablation  05/27/11, 09/28/12   ablation for afib and atrial flutter by Dr Rayann Heman  . CARDIOVERSION  07/28/2011   Procedure: CARDIOVERSION;  Surgeon: Lelon Perla, MD;  Location: [place];  Service: Cardiovascular;  Laterality: N/A;  . COLONOSCOPY WITH PROPOFOL N/A 03/03/2015   Procedure: COLONOSCOPY WITH PROPOFOL;  Surgeon: Garlan Fair, MD;  Location: [facility] ENDOSCOPY;  Service: Gastroenterology;  Laterality: N/A;  . double j stent placement  06/2004   left  . ESOPHAGOGASTRODUODENOSCOPY (EGD) WITH  PROPOFOL N/A 03/03/2015   Procedure: ESOPHAGOGASTRODUODENOSCOPY (EGD) WITH PROPOFOL;  Surgeon: Garlan Fair, MD;  Location: [facility] ENDOSCOPY;  Service: Gastroenterology;  Laterality: N/A;  . INGUINAL HERNIA REPAIR     "as a child; ? right"  . LUNG BIOPSY  1993  . NASAL SEPTOPLASTY W/ TURBINOPLASTY  07/2006  . prostate biopsy  2011  . schwannoma removal  ~01/2010   left thorax  . TEE WITHOUT CARDIOVERSION N/A 09/27/2012   Procedure: TRANSESOPHAGEAL ECHOCARDIOGRAM (TEE);  Surgeon: Larey Dresser, MD;  Location[facility] ENDOSCOPY;  Service: Cardiovascular;  Laterality: N/A;  . UMBILICAL HERNIA REPAIR  07/2006    Family Psychiatric History:  Son- "inadequate personality disorder"   Family History:  Family History  Problem Relation Age of Onset  . Atrial fibrillation      sister has had 2 afib ablations    Social History:   Social History   Social History  . Marital status: Married    Spouse name: N/A  . Number of children: N/A  . Years of education: N/A   Social History Main Topics  . Smoking status: Never Smoker  . Smokeless tobacco: Never Used  . Alcohol use 1.2 oz/week    2 Shots of liquor per week     Comment: occasional, 05-11-2016 Per pt 3 drinks per wk  . Drug use: No     Comment: 05-11-2016 per pt no  . Sexual activity: Yes   Other Topics Concern  . 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    Additional Social History:  He lives with his wife, married for 16 years  Allergies:   Allergies  Allergen Reactions  . Codeine Other (See Comments)    Headache   . Nexium [Esomeprazole Magnesium] Other (See Comments)    Diarrhea   . Lisinopril Cough  . Sulfa Drugs Cross Reactors Rash    Metabolic Disorder Labs: No results found for: HGBA1C, MPG No results found for: PROLACTIN No results found for: CHOL, TRIG, HDL, CHOLHDL, VLDL, LDLCALC   Current Medications: Current Outpatient Prescriptions  Medication Sig Dispense Refill  . allopurinol  (ZYLOPRIM) 100 MG tablet Take 100 mg by mouth 2 (two) times daily.     Marland Kitchen atorvastatin (LIPITOR) 10 MG tablet Take 10 mg by mouth daily.      Marland Kitchen diltiazem (DILACOR XR) 240 MG 24 hr capsule Take 240 mg by mouth every evening.     Marland Kitchen eplerenone (INSPRA) 50 MG tablet Take 50 mg by mouth 2 (two) times daily.     Marland Kitchen ibuprofen (ADVIL,MOTRIN) 200 MG tablet Take 200 mg by mouth every 6 (six) hours as needed for pain.    Marland Kitchen losartan (COZAAR) 50 MG tablet Take 100 mg by mouth daily.  10  . metFORMIN (GLUCOPHAGE) 1000 MG tablet Take 1,000 mg by mouth daily.    Marland Kitchen omeprazole (PRILOSEC) 40 MG capsule Take 40 mg by mouth every evening.    . tadalafil (CIALIS) 10 MG tablet Take 10 mg by mouth daily as needed for erectile dysfunction.    . sertraline (ZOLOFT) 50 MG  tablet Take 25 mg daily for two weeks, then 50 mg daily 30 tablet 0   No current facility-administered medications for this visit.     Neurologic: Headache: No Seizure: No Paresthesias:No  Musculoskeletal: Strength & Muscle Tone: within normal limits Gait & Station: normal Patient leans: N/A  Psychiatric Specialty Exam: Review of Systems  Psychiatric/Behavioral: Positive for depression. Negative for hallucinations, substance abuse and suicidal ideas. The patient has insomnia. The patient is not nervous/anxious.   All other systems reviewed and are negative.   Blood pressure (!) 157/90, pulse 81, height 6\' 1"  (1.854 m), weight 239 lb (108.4 kg).Body mass index is 31.53 kg/m.  General Appearance: Well Groomed  Eye Contact:  Good  Speech:  Clear and Coherent  Volume:  Normal  Mood:  "not good"  Affect:  Appropriate and down  Thought Process:  Coherent and Goal Directed  Orientation:  Full (Time, Place, and Person)  Thought Content:  Logical  Suicidal Thoughts:  No  Homicidal Thoughts:  No  Memory:  Immediate;   Good Recent;   Good Remote;   Good  Judgement:  Good  Insight:  Good  Psychomotor Activity:  Normal  Concentration:   Concentration: Good and Attention Span: Good  Recall:  Good  Fund of Knowledge:Good  Language: Good  Akathisia:  NA  Handed:    AIMS (if indicated): N/A  Assets:  Communication Skills Desire for Improvement  ADL's:  Intact  Cognition: WNL  Sleep:  good   Assessment LUAY CONROW is  60 year old male with no psychiatry history, Afib, GERD, hypertension, renal insufficiency who presented for mental health evaluation.   # Adjustment disorder with depressed mood He reports some neurovegetative symptoms which includes anhedonia, fatigue and irritability which have worsened over the past few years. Psychosocial stressors include loss of his mother. It is noted that he also does have symptoms of burnout, which are characterized by emotional exhaustion and depersonalization. Will start antidepressant given his worsening symptoms and recent episode of anger outburst (making a threatening comment). Side effects which include nausea, vomiting, and sexual dysfunction are dicussed. He will greatly benefit from psychotherapy and will make a referral after appropriate modalities are explored. It is noted that he is cooperative, appropriate through the entire interview and is willing to receive [place] care. He adamantly denies any intent or plan to harm others, and he demonstrates good insight/remorse into his comment. He has no known previous violence and denies any gun access. There is low risk of harm to others based on today's evaluation.   Plan - Start sertraline 25 mg daily for two weeks, then 50 mg daily - RTC in one month  The patient demonstrates the following risk factors for suicide: Chronic risk factors for suicide include: psychiatric disorder of depression and demographic factors (male, >21 y/o). Acute risk factors for suicide include: loss (financial, interpersonal, professional). Protective factors for this patient include: positive social support, responsibility to others (children, family),  coping skills, hope for the future and religious beliefs against suicide. Considering these factors, the overall suicide risk at this point appears to be low. Patient is appropriate for outpatient follow up.   Treatment Plan Summary: Plan as above   Norman Clay, MD 10/17/201712:31 PM

## 2016-05-11 NOTE — Patient Instructions (Signed)
1. Start sertraline 25 mg daily for two weeks, then 50 mg daily 2. Return to clinic in one month

## 2016-05-12 ENCOUNTER — Telehealth (HOSPITAL_COMMUNITY): Payer: Self-pay

## 2016-05-17 ENCOUNTER — Telehealth (HOSPITAL_COMMUNITY): Payer: Self-pay | Admitting: *Deleted

## 2016-05-17 NOTE — Telephone Encounter (Signed)
voice message on 05/14/16 at 12:03 p.m.  Patient returned phone call to Dr. Modesta Messing.  Message not clear.

## 2016-05-17 NOTE — Telephone Encounter (Signed)
Discussed with him on the phone, fyi.

## 2016-05-17 NOTE — Telephone Encounter (Signed)
noted 

## 2016-06-03 ENCOUNTER — Ambulatory Visit: Payer: Self-pay | Admitting: Surgery

## 2016-06-03 NOTE — H&P (Signed)
[name] [id number] PM Location: [facility] Patient #: [id number] DOB: 21-Feb-1956 Married / Language: English / Race: White Male   History of Present Illness Casey [name] B. [name] MD; 12/24/2015 5:18 PM) The patient is a 60 year old male who presents with an inguinal hernia. Casey [name] had a right inguinal hernia repair as a child. He has a high riding right testicle. He has had a varicocele on the left. He plays bassoon and tennis weekly. He has noticed more pain recently and on exam he has a fairly prominent bulge on the left. It is reducible. I told him what to look for and think that observation is reasonable. I will see him whenever   Other Problems Casey [name], CMA; 12/24/2015 4:32 PM) Arthritis Atrial Fibrillation Enlarged Prostate Gastroesophageal Reflux Disease Inguinal Hernia Kidney Stone Other disease, cancer, significant illness Umbilical Hernia Repair  Past Surgical History Casey [name], CMA; 12/24/2015 4:32 PM) Lung Surgery Left. Open Inguinal Hernia Surgery Right. Oral Surgery Vasectomy Ventral / Umbilical Hernia Surgery Right.  Diagnostic Studies History Casey [name], CMA; 12/24/2015 4:32 PM) Colonoscopy 1-5 years ago  Allergies Casey [name], CMA; 12/24/2015 4:33 PM) Sulfa Antibiotics Lisinopril *ANTIHYPERTENSIVES*  Medication History Casey [name], CMA; 12/24/2015 4:34 PM) Allopurinol (100MG  Tablet, Oral) Active. Atorvastatin Calcium (10MG  Tablet, Oral) Active. Dilt-XR (240MG  Capsule ER 24HR, Oral) Active. Eplerenone (50MG  Tablet, Oral) Active. Omeprazole (40MG  Capsule DR, Oral) Active. MetFORMIN HCl ER (500MG  Tablet ER 24HR, Oral) Active. Medications Reconciled  Social History Casey [name], CMA; 12/24/2015 4:32 PM) Alcohol use Occasional alcohol use. No caffeine use No drug use Tobacco use Never smoker.  Family History Casey [name], CMA; 12/24/2015 4:32 PM) Arthritis Father, Mother, Sister. Breast Cancer  Sister. Depression Mother, Son. Heart Disease Mother, Sister. Heart disease in male family member before age 75 Hypertension Mother. Thyroid problems Mother.    Review of Systems (Casey [name]; 12/24/2015 4:32 PM) General Present- Appetite Loss and Weight Loss. Not Present- Chills, Fatigue, Fever, Night Sweats and Weight Gain. Skin Not Present- Change in Wart/Mole, Dryness, Hives, Jaundice, New Lesions, Non-Healing Wounds, Rash and Ulcer. HEENT Present- Hearing Loss and Wears glasses/contact lenses. Not Present- Earache, Hoarseness, Nose Bleed, Oral Ulcers, Ringing in the Ears, Seasonal Allergies, Sinus Pain, Sore Throat, Visual Disturbances and Yellow Eyes. Cardiovascular Present- Leg Cramps. Not Present- Chest Pain, Difficulty Breathing Lying Down, Palpitations, Rapid Heart Rate, Shortness of Breath and Swelling of Extremities. Gastrointestinal Present- Gets full quickly at meals and Indigestion. Not Present- Abdominal Pain, Bloating, Bloody Stool, Change in Bowel Habits, Chronic diarrhea, Constipation, Difficulty Swallowing, Excessive gas, Hemorrhoids, Nausea, Rectal Pain and Vomiting. Male Genitourinary Present- Impotence and Urine Leakage. Not Present- Blood in Urine, Change in Urinary Stream, Frequency, Nocturia, Painful Urination and Urgency. Musculoskeletal Present- Joint Stiffness. Not Present- Back Pain, Joint Pain, Muscle Pain, Muscle Weakness and Swelling of Extremities.  Vitals (Casey [name] CMA; 12/24/2015 4:33 PM) 12/24/2015 4:32 PM Weight: 243 lb Height: 73in Body Surface Area: 2.34 m Body Mass Index: 32.06 kg/m  Temp.: 25F(Temporal)  Pulse: 80 (Regular)  BP: 134/82 (Sitting, Left Arm, Standard)       Physical Exam (Casey [name] B. [name] MD; 12/24/2015 5:19 PM) Male Genitourinary Note: Left inguinal hernia that is reducilble.     Assessment & Plan Casey [name] B. [name] MD; 12/24/2015 5:21 PM) LEFT INGUINAL HERNIA (K40.90)

## 2016-06-09 NOTE — Progress Notes (Addendum)
[place] MD/PA/NP OP Progress Note  06/14/2016 0000000 PM Casey [name]  MRN:  [id number]  Chief Complaint:  Chief Complaint    Depression; Follow-up     Subjective:  "I am doing fine" HPI:  He comes here for follow up appointment. He states that he is doing well since the last visit. He does not have significant irritability and denies any issues at work. He "learned lesson" and "even tempered and less labile." He enjoys interaction with patients at work. He noticed headache and somnolence since starting sertraline; he started to take it at night.   He denies insomnia. He denies anhedonia; went to a concert the other night and enjoys it. He denies SI. He denies drug use. He had seen a therapist, Mr. Nicole Kindred porrett LCAS last Monday for intake.   Visit Diagnosis:    ICD-9-CM ICD-10-CM   1. Adjustment disorder with depressed mood 309.0 F43.21     Past Psychiatric History:  Outpatient: used to see a therapist, 20 years ago in the setting of divorce and having challenge with his child Psychiatry admission: denies Previous suicide attempt: denies Past trials of medication: denies  Past Medical History:  Past Medical History:  Diagnosis Date  . 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HCC)    ongoing evaluation for elevated R>L adosterone secretion  . Arthritis   . Atrial enlargement, bilateral   . Atrial fibrillation (HCC)    paroxysmal  . Atrial flutter ([place])   . GERD (gastroesophageal reflux disease)   . Hypertension   . Kidney cysts    "multiple"  . Nephrolithiasis   . Pneumonia 1990's  . Prostatitis   . Renal insufficiency    "Cr 1.6"    Past Surgical History:  Procedure Laterality Date  . ATRIAL FIBRILLATION ABLATION N/A 09/28/2012   Procedure: ATRIAL FIBRILLATION ABLATION;  Surgeon: Thompson Grayer, MD;  Location: [facility] CATH LAB;  Service: Cardiovascular;  Laterality: N/A;  . atrial fibrillation ablation with CTI ablation  05/27/11, 09/28/12   ablation for afib and atrial flutter by Dr  Rayann Heman  . CARDIOVERSION  07/28/2011   Procedure: CARDIOVERSION;  Surgeon: Lelon Perla, MD;  Location: [place];  Service: Cardiovascular;  Laterality: N/A;  . COLONOSCOPY WITH PROPOFOL N/A 03/03/2015   Procedure: COLONOSCOPY WITH PROPOFOL;  Surgeon: Garlan Fair, MD;  Location: [facility] ENDOSCOPY;  Service: Gastroenterology;  Laterality: N/A;  . double j stent placement  06/2004   left  . ESOPHAGOGASTRODUODENOSCOPY (EGD) WITH PROPOFOL N/A 03/03/2015   Procedure: ESOPHAGOGASTRODUODENOSCOPY (EGD) WITH PROPOFOL;  Surgeon: Garlan Fair, MD;  Location: [facility] ENDOSCOPY;  Service: Gastroenterology;  Laterality: N/A;  . INGUINAL HERNIA REPAIR     "as a child; ? right"  . LUNG BIOPSY  1993  . NASAL SEPTOPLASTY W/ TURBINOPLASTY  07/2006  . prostate biopsy  2011  . schwannoma removal  ~01/2010   left thorax  . TEE WITHOUT CARDIOVERSION N/A 09/27/2012   Procedure: TRANSESOPHAGEAL ECHOCARDIOGRAM (TEE);  Surgeon: Larey Dresser, MD;  Location[facility] ENDOSCOPY;  Service: Cardiovascular;  Laterality: N/A;  . UMBILICAL HERNIA REPAIR  07/2006    Family Psychiatric History: Son- "inadequate personality disorder"  Family History:  Family History  Problem Relation Age of Onset  . Atrial fibrillation      sister has had 2 afib ablations    Social History:  Social History   Social History  . Marital status: Married    Spouse name: N/A  . Number of children: N/A  . Years of education: N/A  Social History Main Topics  . Smoking status: Never Smoker  . Smokeless tobacco: Never Used  . Alcohol use 1.2 oz/week    2 Shots of liquor per week     Comment: occasional, 05-11-2016 Per pt 3 drinks per wk  . Drug use: No     Comment: 05-11-2016 per pt no  . Sexual activity: Yes   Other Topics Concern  . 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    Allergies:  Allergies  Allergen Reactions  . Codeine Other (See Comments)    Headache   . Nexium [Esomeprazole Magnesium] Other (See  Comments)    Diarrhea   . Lisinopril Cough  . Sulfa Drugs Cross Reactors Rash    Metabolic Disorder Labs: No results found for: HGBA1C, MPG No results found for: PROLACTIN No results found for: CHOL, TRIG, HDL, CHOLHDL, VLDL, LDLCALC   Current Medications: Current Outpatient Prescriptions  Medication Sig Dispense Refill  . allopurinol (ZYLOPRIM) 100 MG tablet Take 100 mg by mouth 2 (two) times daily.     Marland Kitchen atorvastatin (LIPITOR) 10 MG tablet Take 10 mg by mouth daily.      Marland Kitchen diltiazem (DILACOR XR) 240 MG 24 hr capsule Take 360 mg by mouth every evening.     Marland Kitchen eplerenone (INSPRA) 50 MG tablet Take 50 mg by mouth 2 (two) times daily.     Marland Kitchen ibuprofen (ADVIL,MOTRIN) 200 MG tablet Take 200 mg by mouth every 6 (six) hours as needed for pain.    Marland Kitchen losartan (COZAAR) 50 MG tablet Take 100 mg by mouth daily.  10  . metFORMIN (GLUCOPHAGE) 1000 MG tablet Take 1,000 mg by mouth daily.    Marland Kitchen omeprazole (PRILOSEC) 40 MG capsule Take 40 mg by mouth every evening.    . sertraline (ZOLOFT) 50 MG tablet Take 1 tablet (50 mg total) by mouth at bedtime. 30 tablet 1  . tadalafil (CIALIS) 10 MG tablet Take 10 mg by mouth daily as needed for erectile dysfunction.     No current facility-administered medications for this visit.     Neurologic: Headache: Yes Seizure: No Paresthesias: No  Musculoskeletal: Strength & Muscle Tone: within normal limits Gait & Station: normal Patient leans: N/A  Psychiatric Specialty Exam: ROS  Blood pressure 130/78, pulse 71, height 6' 0.5" (1.842 m), weight 239 lb 9.6 oz (108.7 kg).Body mass index is 32.05 kg/m.  General Appearance: Well Groomed  Eye Contact:  Good  Speech:  Clear and Coherent  Volume:  Normal  Mood:  "less labile"  Affect:  slightly down  Thought Process:  Coherent  Orientation:  Full (Time, Place, and Person)  Thought Content: Logical  Perceptions: denies AH/VH  Suicidal Thoughts:  No  Homicidal Thoughts:  No  Memory:  Immediate;    Good Recent;   Good Remote;   Good  Judgement:  Good  Insight:  Good  Psychomotor Activity:  Normal  Concentration:  Concentration: Good and Attention Span: Good  Recall:  Good  Fund of Knowledge: Good  Language: Good  Akathisia:  No  Handed:    AIMS (if indicated):  N/A  Assets:  Communication Skills Desire for Improvement  ADL's:  Intact  Cognition: WNL  Sleep:  good   Assessment Casey [name] is  60 year old male with no psychiatry history, Afib, GERD, hypertension, renal insufficiency who originally presented for mental health evaluation after making a threatening comment at work. Psychosocial stressors include loss of his mother.  #  Adjustment disorder with depressed mood He denies significant mood symptoms or issues at work since the last visit. Will continue the current dose. Noted that he reports some symptoms (headache, cognitive slowing, drowsiness) since starting medication; will continue to monitor. Patient has started to see a therapist at [facility]. Will plan to contact him for continuation of care.   Plan - Continue sertraline 50 mg daily - RTC in two months - Will contact Mr. Nicole Kindred porrett LCAS [phone]  The patient demonstrates the following risk factors for suicide: Chronic risk factors for suicide include: psychiatric disorder of depression and demographic factors (male, >35 y/o). Acute risk factors for suicide include: loss (financial, interpersonal, professional). Protective factors for this patient include: positive social support, responsibility to others (children, family), coping skills, hope for the future and religious beliefs against suicide. Considering these factors, the overall suicide risk at this point appears to be low. Patient is appropriate for outpatient follow up.  Treatment Plan Summary:Plan as above  Addendum: left a voice message to Mr. Nicole Kindred porrett LCAS [phone] to call back the clinic when available.     Norman Clay, MD 06/14/2016, 12:21 PM

## 2016-06-14 ENCOUNTER — Ambulatory Visit (INDEPENDENT_AMBULATORY_CARE_PROVIDER_SITE_OTHER): Payer: 59 | Admitting: Psychiatry

## 2016-06-14 ENCOUNTER — Encounter (HOSPITAL_COMMUNITY): Payer: Self-pay | Admitting: Psychiatry

## 2016-06-14 ENCOUNTER — Other Ambulatory Visit (HOSPITAL_COMMUNITY): Payer: Self-pay | Admitting: *Deleted

## 2016-06-14 VITALS — BP 130/78 | HR 71 | Ht 72.5 in | Wt 239.6 lb

## 2016-06-14 DIAGNOSIS — Z79899 Other long term (current) drug therapy: Secondary | ICD-10-CM

## 2016-06-14 DIAGNOSIS — F4321 Adjustment disorder with depressed mood: Secondary | ICD-10-CM | POA: Diagnosis not present

## 2016-06-14 DIAGNOSIS — Z8249 Family history of ischemic heart disease and other diseases of the circulatory system: Secondary | ICD-10-CM

## 2016-06-14 MED ORDER — SERTRALINE HCL 50 MG PO TABS: 50.0000 mg | ORAL_TABLET | Freq: Every day | ORAL | 1 refills | Status: DC

## 2016-06-14 NOTE — Patient Instructions (Addendum)
1. Continue sertraline 50 mg at night 2. Return to clinic in 2 months

## 2016-06-14 NOTE — Telephone Encounter (Signed)
Refill for Sertraline received. Chart reviewed, refill had already been filled by MD. No further action required.

## 2016-06-15 ENCOUNTER — Telehealth (HOSPITAL_COMMUNITY): Payer: Self-pay | Admitting: Psychiatry

## 2016-06-15 NOTE — Telephone Encounter (Signed)
Discussed with Mr. Robie Ridge from [place] program [phone] with patient consent.   Patient may be referred to a physician coaching program in [name] (using skype.) Patient might get in monitoring system as necessary, and the notification will be sent to this Probation officer.

## 2016-06-21 ENCOUNTER — Encounter (HOSPITAL_BASED_OUTPATIENT_CLINIC_OR_DEPARTMENT_OTHER): Payer: Self-pay | Admitting: *Deleted

## 2016-06-22 ENCOUNTER — Encounter (HOSPITAL_BASED_OUTPATIENT_CLINIC_OR_DEPARTMENT_OTHER)
Admission: RE | Admit: 2016-06-22 | Discharge: 2016-06-22 | Disposition: A | Discharge status: Home / Self Care | Payer: Managed Care, Other (non HMO) | Source: Ambulatory Visit | Attending: Surgery | Admitting: Surgery

## 2016-06-22 DIAGNOSIS — Z8261 Family history of arthritis: Secondary | ICD-10-CM | POA: Diagnosis not present

## 2016-06-22 DIAGNOSIS — K4030 Unilateral inguinal hernia, with obstruction, without gangrene, not specified as recurrent: Secondary | ICD-10-CM | POA: Diagnosis present

## 2016-06-22 DIAGNOSIS — E669 Obesity, unspecified: Secondary | ICD-10-CM | POA: Diagnosis not present

## 2016-06-22 DIAGNOSIS — I4891 Unspecified atrial fibrillation: Secondary | ICD-10-CM | POA: Diagnosis present

## 2016-06-22 DIAGNOSIS — E118 Type 2 diabetes mellitus with unspecified complications: Secondary | ICD-10-CM | POA: Diagnosis present

## 2016-06-22 DIAGNOSIS — Z9852 Vasectomy status: Secondary | ICD-10-CM | POA: Diagnosis not present

## 2016-06-22 DIAGNOSIS — Z87442 Personal history of urinary calculi: Secondary | ICD-10-CM | POA: Diagnosis not present

## 2016-06-22 DIAGNOSIS — N400 Enlarged prostate without lower urinary tract symptoms: Secondary | ICD-10-CM | POA: Diagnosis not present

## 2016-06-22 DIAGNOSIS — K219 Gastro-esophageal reflux disease without esophagitis: Secondary | ICD-10-CM | POA: Diagnosis not present

## 2016-06-22 DIAGNOSIS — Z888 Allergy status to other drugs, medicaments and biological substances status: Secondary | ICD-10-CM | POA: Diagnosis not present

## 2016-06-22 DIAGNOSIS — Z79899 Other long term (current) drug therapy: Secondary | ICD-10-CM | POA: Diagnosis not present

## 2016-06-22 DIAGNOSIS — Z882 Allergy status to sulfonamides status: Secondary | ICD-10-CM | POA: Diagnosis not present

## 2016-06-22 DIAGNOSIS — Z818 Family history of other mental and behavioral disorders: Secondary | ICD-10-CM | POA: Diagnosis not present

## 2016-06-22 DIAGNOSIS — Z803 Family history of malignant neoplasm of breast: Secondary | ICD-10-CM | POA: Diagnosis not present

## 2016-06-22 DIAGNOSIS — Z8249 Family history of ischemic heart disease and other diseases of the circulatory system: Secondary | ICD-10-CM | POA: Diagnosis not present

## 2016-06-22 DIAGNOSIS — E119 Type 2 diabetes mellitus without complications: Secondary | ICD-10-CM | POA: Diagnosis not present

## 2016-06-22 DIAGNOSIS — M1990 Unspecified osteoarthritis, unspecified site: Secondary | ICD-10-CM | POA: Diagnosis not present

## 2016-06-22 DIAGNOSIS — Z8349 Family history of other endocrine, nutritional and metabolic diseases: Secondary | ICD-10-CM | POA: Diagnosis not present

## 2016-06-22 DIAGNOSIS — Z7984 Long term (current) use of oral hypoglycemic drugs: Secondary | ICD-10-CM | POA: Diagnosis not present

## 2016-06-22 DIAGNOSIS — Z6831 Body mass index (BMI) 31.0-31.9, adult: Secondary | ICD-10-CM | POA: Diagnosis not present

## 2016-06-22 DIAGNOSIS — I861 Scrotal varices: Secondary | ICD-10-CM | POA: Diagnosis not present

## 2016-06-22 DIAGNOSIS — I10 Essential (primary) hypertension: Secondary | ICD-10-CM | POA: Insufficient documentation

## 2016-06-22 DIAGNOSIS — Z9889 Other specified postprocedural states: Secondary | ICD-10-CM | POA: Diagnosis not present

## 2016-06-22 LAB — BASIC METABOLIC PANEL
Anion gap: 8 (ref 5–15)
BUN: 27 mg/dL — ABNORMAL HIGH (ref 6–20)
CO2: 25 mmol/L (ref 22–32)
Calcium: 9.6 mg/dL (ref 8.9–10.3)
Chloride: 107 mmol/L (ref 101–111)
Creatinine, Ser: 1.81 mg/dL — ABNORMAL HIGH (ref 0.61–1.24)
GFR calc non Af Amer: 38 mL/min — ABNORMAL LOW (ref >60)
GFR, EST AFRICAN AMERICAN: 45 mL/min — AB (ref >60)
Glucose, Bld: 135 mg/dL — ABNORMAL HIGH (ref 65–99)
POTASSIUM: 4.3 mmol/L (ref 3.5–5.1)
Sodium: 140 mmol/L (ref 135–145)

## 2016-06-22 NOTE — Progress Notes (Signed)
Abnormal lab results reviewed by Dr Rodman Comp no further testing, but wanted pt to be call about results and to check with pcp after surgery.  Pt made aware,

## 2016-06-23 ENCOUNTER — Encounter (HOSPITAL_BASED_OUTPATIENT_CLINIC_OR_DEPARTMENT_OTHER)
Admission: RE | Disposition: A | Discharge status: Home / Self Care | Payer: Self-pay | Source: Ambulatory Visit | Attending: Surgery

## 2016-06-23 ENCOUNTER — Ambulatory Visit (HOSPITAL_BASED_OUTPATIENT_CLINIC_OR_DEPARTMENT_OTHER): Payer: Managed Care, Other (non HMO) | Admitting: Certified Registered"

## 2016-06-23 ENCOUNTER — Ambulatory Visit (HOSPITAL_BASED_OUTPATIENT_CLINIC_OR_DEPARTMENT_OTHER)
Admission: RE | Admit: 2016-06-23 | Discharge: 2016-06-23 | Disposition: A | Discharge status: Home / Self Care | Payer: Managed Care, Other (non HMO) | Source: Ambulatory Visit | Attending: Surgery | Admitting: Surgery

## 2016-06-23 ENCOUNTER — Encounter (HOSPITAL_BASED_OUTPATIENT_CLINIC_OR_DEPARTMENT_OTHER): Payer: Self-pay | Admitting: Certified Registered"

## 2016-06-23 DIAGNOSIS — Z818 Family history of other mental and behavioral disorders: Secondary | ICD-10-CM | POA: Insufficient documentation

## 2016-06-23 DIAGNOSIS — Z87442 Personal history of urinary calculi: Secondary | ICD-10-CM | POA: Insufficient documentation

## 2016-06-23 DIAGNOSIS — K4030 Unilateral inguinal hernia, with obstruction, without gangrene, not specified as recurrent: Secondary | ICD-10-CM | POA: Diagnosis not present

## 2016-06-23 DIAGNOSIS — Z803 Family history of malignant neoplasm of breast: Secondary | ICD-10-CM | POA: Insufficient documentation

## 2016-06-23 DIAGNOSIS — K4090 Unilateral inguinal hernia, without obstruction or gangrene, not specified as recurrent: Secondary | ICD-10-CM

## 2016-06-23 DIAGNOSIS — E119 Type 2 diabetes mellitus without complications: Secondary | ICD-10-CM | POA: Insufficient documentation

## 2016-06-23 DIAGNOSIS — M1990 Unspecified osteoarthritis, unspecified site: Secondary | ICD-10-CM | POA: Insufficient documentation

## 2016-06-23 DIAGNOSIS — Z8261 Family history of arthritis: Secondary | ICD-10-CM | POA: Insufficient documentation

## 2016-06-23 DIAGNOSIS — Z79899 Other long term (current) drug therapy: Secondary | ICD-10-CM | POA: Insufficient documentation

## 2016-06-23 DIAGNOSIS — Z882 Allergy status to sulfonamides status: Secondary | ICD-10-CM | POA: Insufficient documentation

## 2016-06-23 DIAGNOSIS — Z7984 Long term (current) use of oral hypoglycemic drugs: Secondary | ICD-10-CM | POA: Insufficient documentation

## 2016-06-23 DIAGNOSIS — Z8349 Family history of other endocrine, nutritional and metabolic diseases: Secondary | ICD-10-CM | POA: Insufficient documentation

## 2016-06-23 DIAGNOSIS — Z6831 Body mass index (BMI) 31.0-31.9, adult: Secondary | ICD-10-CM | POA: Insufficient documentation

## 2016-06-23 DIAGNOSIS — Z8249 Family history of ischemic heart disease and other diseases of the circulatory system: Secondary | ICD-10-CM | POA: Insufficient documentation

## 2016-06-23 DIAGNOSIS — I4891 Unspecified atrial fibrillation: Secondary | ICD-10-CM | POA: Insufficient documentation

## 2016-06-23 DIAGNOSIS — N400 Enlarged prostate without lower urinary tract symptoms: Secondary | ICD-10-CM | POA: Insufficient documentation

## 2016-06-23 DIAGNOSIS — Z888 Allergy status to other drugs, medicaments and biological substances status: Secondary | ICD-10-CM | POA: Insufficient documentation

## 2016-06-23 DIAGNOSIS — Z9852 Vasectomy status: Secondary | ICD-10-CM | POA: Insufficient documentation

## 2016-06-23 DIAGNOSIS — I861 Scrotal varices: Secondary | ICD-10-CM | POA: Insufficient documentation

## 2016-06-23 DIAGNOSIS — K219 Gastro-esophageal reflux disease without esophagitis: Secondary | ICD-10-CM | POA: Insufficient documentation

## 2016-06-23 DIAGNOSIS — Z9889 Other specified postprocedural states: Secondary | ICD-10-CM | POA: Insufficient documentation

## 2016-06-23 DIAGNOSIS — E669 Obesity, unspecified: Secondary | ICD-10-CM | POA: Insufficient documentation

## 2016-06-23 HISTORY — DX: Prediabetes: R73.03

## 2016-06-23 HISTORY — PX: INGUINAL HERNIA REPAIR: SHX194

## 2016-06-23 HISTORY — DX: Cardiac arrhythmia, unspecified: I49.9

## 2016-06-23 HISTORY — DX: Adverse effect of unspecified anesthetic, initial encounter: T41.45XA

## 2016-06-23 HISTORY — PX: INSERTION OF MESH: SHX5868

## 2016-06-23 HISTORY — DX: Other complications of anesthesia, initial encounter: T88.59XA

## 2016-06-23 LAB — GLUCOSE, CAPILLARY: GLUCOSE-CAPILLARY: 95 mg/dL (ref 65–99)

## 2016-06-23 SURGERY — REPAIR, HERNIA, INGUINAL, ADULT
Anesthesia: General | Site: Inguinal | Laterality: Left

## 2016-06-23 MED ORDER — ROCURONIUM BROMIDE 10 MG/ML (PF) SYRINGE
PREFILLED_SYRINGE | INTRAVENOUS | Status: AC
  Filled 2016-06-23: qty 10

## 2016-06-23 MED ORDER — HYDROCODONE-ACETAMINOPHEN 7.5-325 MG PO TABS: 1.0000 | ORAL_TABLET | Freq: Once | ORAL | Status: DC | PRN

## 2016-06-23 MED ORDER — FENTANYL CITRATE (PF) 100 MCG/2ML IJ SOLN
INTRAMUSCULAR | Status: AC
Start: 2016-06-23 — End: 2016-06-23
  Filled 2016-06-23: qty 2

## 2016-06-23 MED ORDER — SODIUM CHLORIDE 0.9 % IJ SOLN
INTRAMUSCULAR | Status: AC
  Filled 2016-06-23: qty 10

## 2016-06-23 MED ORDER — CHLORHEXIDINE GLUCONATE CLOTH 2 % EX PADS: 6.0000 | MEDICATED_PAD | Freq: Once | CUTANEOUS | Status: DC

## 2016-06-23 MED ORDER — ACETAMINOPHEN 500 MG PO TABS
1000.0000 mg | ORAL_TABLET | ORAL | Status: AC
  Administered 2016-06-23: 1000 mg via ORAL

## 2016-06-23 MED ORDER — LIDOCAINE 2% (20 MG/ML) 5 ML SYRINGE
INTRAMUSCULAR | Status: AC
  Filled 2016-06-23: qty 5

## 2016-06-23 MED ORDER — GABAPENTIN 300 MG PO CAPS
300.0000 mg | ORAL_CAPSULE | ORAL | Status: AC
  Administered 2016-06-23: 300 mg via ORAL

## 2016-06-23 MED ORDER — DEXAMETHASONE SODIUM PHOSPHATE 10 MG/ML IJ SOLN
INTRAMUSCULAR | Status: AC
  Filled 2016-06-23: qty 1

## 2016-06-23 MED ORDER — PROPOFOL 10 MG/ML IV BOLUS
INTRAVENOUS | Status: DC | PRN
  Administered 2016-06-23: 150 mg via INTRAVENOUS

## 2016-06-23 MED ORDER — MIDAZOLAM HCL 2 MG/2ML IJ SOLN
INTRAMUSCULAR | Status: AC
  Filled 2016-06-23: qty 2

## 2016-06-23 MED ORDER — ONDANSETRON HCL 4 MG/2ML IJ SOLN
INTRAMUSCULAR | Status: AC
  Filled 2016-06-23: qty 2

## 2016-06-23 MED ORDER — HEPARIN SODIUM (PORCINE) 5000 UNIT/ML IJ SOLN
5000.0000 [IU] | Freq: Once | INTRAMUSCULAR | Status: AC
  Administered 2016-06-23: 5000 [IU] via SUBCUTANEOUS

## 2016-06-23 MED ORDER — DEXAMETHASONE SODIUM PHOSPHATE 4 MG/ML IJ SOLN
INTRAMUSCULAR | Status: DC | PRN
  Administered 2016-06-23: 10 mg via INTRAVENOUS

## 2016-06-23 MED ORDER — 0.9 % SODIUM CHLORIDE (POUR BTL) OPTIME
TOPICAL | Status: DC | PRN
  Administered 2016-06-23: 100 mL

## 2016-06-23 MED ORDER — MEPERIDINE HCL 25 MG/ML IJ SOLN: 6.2500 mg | INTRAMUSCULAR | Status: DC | PRN

## 2016-06-23 MED ORDER — HEPARIN SODIUM (PORCINE) 5000 UNIT/ML IJ SOLN
INTRAMUSCULAR | Status: AC
  Filled 2016-06-23: qty 1

## 2016-06-23 MED ORDER — CEFAZOLIN SODIUM-DEXTROSE 2-4 GM/100ML-% IV SOLN
2.0000 g | INTRAVENOUS | Status: AC
  Administered 2016-06-23: 2 g via INTRAVENOUS

## 2016-06-23 MED ORDER — GABAPENTIN 300 MG PO CAPS
ORAL_CAPSULE | ORAL | Status: AC
  Filled 2016-06-23: qty 1

## 2016-06-23 MED ORDER — ONDANSETRON HCL 4 MG/2ML IJ SOLN
INTRAMUSCULAR | Status: DC | PRN
  Administered 2016-06-23: 4 mg via INTRAVENOUS

## 2016-06-23 MED ORDER — EPHEDRINE 5 MG/ML INJ
INTRAVENOUS | Status: AC
  Filled 2016-06-23: qty 10

## 2016-06-23 MED ORDER — CEFAZOLIN SODIUM-DEXTROSE 2-4 GM/100ML-% IV SOLN
INTRAVENOUS | Status: AC
  Filled 2016-06-23: qty 100

## 2016-06-23 MED ORDER — FENTANYL CITRATE (PF) 100 MCG/2ML IJ SOLN
50.0000 ug | INTRAMUSCULAR | Status: AC | PRN
  Administered 2016-06-23 (×2): 25 ug via INTRAVENOUS
  Administered 2016-06-23 (×2): 50 ug via INTRAVENOUS
  Administered 2016-06-23: 25 ug via INTRAVENOUS

## 2016-06-23 MED ORDER — MIDAZOLAM HCL 2 MG/2ML IJ SOLN
1.0000 mg | INTRAMUSCULAR | Status: DC | PRN
  Administered 2016-06-23: 1 mg via INTRAVENOUS

## 2016-06-23 MED ORDER — PROPOFOL 500 MG/50ML IV EMUL
INTRAVENOUS | Status: AC
  Filled 2016-06-23: qty 50

## 2016-06-23 MED ORDER — LACTATED RINGERS IV SOLN
INTRAVENOUS | Status: DC
  Administered 2016-06-23 (×2): via INTRAVENOUS

## 2016-06-23 MED ORDER — ACETAMINOPHEN 500 MG PO TABS
ORAL_TABLET | ORAL | Status: AC
  Filled 2016-06-23: qty 2

## 2016-06-23 MED ORDER — CELECOXIB 400 MG PO CAPS: 400.0000 mg | ORAL_CAPSULE | ORAL | Status: DC

## 2016-06-23 MED ORDER — HYDROMORPHONE HCL 1 MG/ML IJ SOLN: 0.2500 mg | INTRAMUSCULAR | Status: DC | PRN

## 2016-06-23 MED ORDER — EPHEDRINE SULFATE 50 MG/ML IJ SOLN
INTRAMUSCULAR | Status: DC | PRN
  Administered 2016-06-23: 10 mg via INTRAVENOUS

## 2016-06-23 MED ORDER — METOCLOPRAMIDE HCL 5 MG/ML IJ SOLN: 10.0000 mg | Freq: Once | INTRAMUSCULAR | Status: DC | PRN

## 2016-06-23 MED ORDER — BUPIVACAINE LIPOSOME 1.3 % IJ SUSP
INTRAMUSCULAR | Status: AC
  Filled 2016-06-23: qty 20

## 2016-06-23 MED ORDER — SODIUM CHLORIDE 0.9 % IV SOLN
INTRAVENOUS | Status: DC | PRN
  Administered 2016-06-23: 30 mL

## 2016-06-23 MED ORDER — SCOPOLAMINE 1 MG/3DAYS TD PT72: 1.0000 | MEDICATED_PATCH | Freq: Once | TRANSDERMAL | Status: DC | PRN

## 2016-06-23 MED ORDER — FENTANYL CITRATE (PF) 100 MCG/2ML IJ SOLN
INTRAMUSCULAR | Status: AC
  Filled 2016-06-23: qty 2

## 2016-06-23 MED ORDER — HYDROCODONE-ACETAMINOPHEN 7.5-325 MG PO TABS: 1.0000 | ORAL_TABLET | Freq: Once | ORAL | 0 refills | Status: DC | PRN

## 2016-06-23 SURGICAL SUPPLY — 60 items
ADH SKN CLS APL DERMABOND .7 (GAUZE/BANDAGES/DRESSINGS) ×1
APL SKNCLS STERI-STRIP NONHPOA (GAUZE/BANDAGES/DRESSINGS)
BENZOIN TINCTURE PRP APPL 2/3 (GAUZE/BANDAGES/DRESSINGS) IMPLANT
BLADE CLIPPER SURG (BLADE) ×1 IMPLANT
BLADE SURG 15 STRL LF DISP TIS (BLADE) ×1 IMPLANT
BLADE SURG 15 STRL SS (BLADE) ×2
CANISTER SUCT 1200ML W/VALVE (MISCELLANEOUS) ×2 IMPLANT
CLEANER CAUTERY TIP 5X5 PAD (MISCELLANEOUS) ×1 IMPLANT
COVER BACK TABLE 60X90IN (DRAPES) ×2 IMPLANT
COVER MAYO STAND STRL (DRAPES) ×2 IMPLANT
DECANTER SPIKE VIAL GLASS SM (MISCELLANEOUS) ×2 IMPLANT
DERMABOND ADVANCED (GAUZE/BANDAGES/DRESSINGS) ×1
DERMABOND ADVANCED .7 DNX12 (GAUZE/BANDAGES/DRESSINGS) ×1 IMPLANT
DRAIN PENROSE 1/2X12 LTX STRL (WOUND CARE) ×2 IMPLANT
DRAPE LAPAROTOMY T 102X78X121 (DRAPES) ×2 IMPLANT
DRAPE UTILITY XL STRL (DRAPES) ×3 IMPLANT
ELECT COATED BLADE 2.86 ST (ELECTRODE) ×3 IMPLANT
ELECT REM PT RETURN 9FT ADLT (ELECTROSURGICAL) ×2
ELECTRODE REM PT RTRN 9FT ADLT (ELECTROSURGICAL) ×1 IMPLANT
GAUZE SPONGE 4X4 16PLY XRAY LF (GAUZE/BANDAGES/DRESSINGS) IMPLANT
GLOVE BIO SURGEON STRL SZ8 (GLOVE) ×2 IMPLANT
GLOVE BIOGEL PI IND STRL 7.0 (GLOVE) IMPLANT
GLOVE BIOGEL PI IND STRL 7.5 (GLOVE) IMPLANT
GLOVE BIOGEL PI INDICATOR 7.0 (GLOVE) ×1
GLOVE BIOGEL PI INDICATOR 7.5 (GLOVE) ×3
GLOVE ECLIPSE 6.5 STRL STRAW (GLOVE) ×1 IMPLANT
GLOVE SURG SYN 7.5  E (GLOVE) ×2
GLOVE SURG SYN 7.5 E (GLOVE) ×2 IMPLANT
GLOVE SURG SYN 7.5 PF PI (GLOVE) IMPLANT
GOWN STRL REUS W/ TWL LRG LVL3 (GOWN DISPOSABLE) ×1 IMPLANT
GOWN STRL REUS W/ TWL XL LVL3 (GOWN DISPOSABLE) ×1 IMPLANT
GOWN STRL REUS W/TWL LRG LVL3 (GOWN DISPOSABLE) ×6
GOWN STRL REUS W/TWL XL LVL3 (GOWN DISPOSABLE) ×2
MESH HERNIA 3X6 (Mesh General) ×1 IMPLANT
NDL HYPO 25X1 1.5 SAFETY (NEEDLE) ×1 IMPLANT
NEEDLE HYPO 25X1 1.5 SAFETY (NEEDLE) ×2 IMPLANT
NS IRRIG 1000ML POUR BTL (IV SOLUTION) ×2 IMPLANT
PACK BASIN DAY SURGERY FS (CUSTOM PROCEDURE TRAY) ×2 IMPLANT
PAD CLEANER CAUTERY TIP 5X5 (MISCELLANEOUS)
PENCIL BUTTON HOLSTER BLD 10FT (ELECTRODE) ×2 IMPLANT
SLEEVE SCD COMPRESS KNEE MED (MISCELLANEOUS) ×1 IMPLANT
SPONGE GAUZE 4X4 12PLY STER LF (GAUZE/BANDAGES/DRESSINGS) IMPLANT
SPONGE LAP 4X18 X RAY DECT (DISPOSABLE) ×2 IMPLANT
STAPLER VISISTAT 35W (STAPLE) IMPLANT
STRIP CLOSURE SKIN 1/2X4 (GAUZE/BANDAGES/DRESSINGS) IMPLANT
SUT MON AB 5-0 PS2 18 (SUTURE) ×2 IMPLANT
SUT PROLENE 2 0 CT2 30 (SUTURE) ×6 IMPLANT
SUT SILK 2 0 SH (SUTURE) IMPLANT
SUT VIC AB 2-0 SH 27 (SUTURE) ×4
SUT VIC AB 2-0 SH 27XBRD (SUTURE) ×1 IMPLANT
SUT VIC AB 4-0 SH 18 (SUTURE) ×2 IMPLANT
SUT VIC AB 5-0 P-3 18X BRD (SUTURE) IMPLANT
SUT VIC AB 5-0 P3 18 (SUTURE)
SUT VICRYL 4-0 PS2 18IN ABS (SUTURE) IMPLANT
SYR BULB 3OZ (MISCELLANEOUS) ×2 IMPLANT
SYR CONTROL 10ML LL (SYRINGE) ×2 IMPLANT
TOWEL OR 17X24 6PK STRL BLUE (TOWEL DISPOSABLE) ×4 IMPLANT
TRAY DSU PREP LF (CUSTOM PROCEDURE TRAY) ×2 IMPLANT
TUBE CONNECTING 20X1/4 (TUBING) ×2 IMPLANT
YANKAUER SUCT BULB TIP NO VENT (SUCTIONS) ×2 IMPLANT

## 2016-06-23 NOTE — Anesthesia Preprocedure Evaluation (Addendum)
Anesthesia Evaluation  Patient identified by MRN, date of birth, ID band Patient awake  General Assessment Comment:Urinary retention post op  Reviewed: Allergy & Precautions, NPO status , Patient's Chart, lab work & pertinent test results  History of Anesthesia Complications (+) history of anesthetic complications  Airway Mallampati: III       Dental no notable dental hx. (+) Teeth Intact   Pulmonary pneumonia, resolved,    Pulmonary exam normal breath sounds clear to auscultation       Cardiovascular hypertension, Pt. on medications Normal cardiovascular exam+ dysrhythmias Atrial Fibrillation  Rhythm:Regular Rate:Normal     Neuro/Psych PSYCHIATRIC DISORDERS negative neurological ROS     GI/Hepatic GERD  ,  Endo/Other  diabetes, Well Controlled, Type 2, Oral Hypoglycemic AgentsObesity Hx/o adrenocortical hyperfunction  Renal/GU Renal InsufficiencyRenal diseaseMultiple renal cysts   Hx/o prostatitis    Musculoskeletal  (+) Arthritis , Osteoarthritis,    Abdominal (+) + obese,   Peds  Hematology negative hematology ROS (+)   Anesthesia Other Findings   Reproductive/Obstetrics                             Anesthesia Physical Anesthesia Plan  ASA: II  Anesthesia Plan: General   Post-op Pain Management:  Regional for Post-op pain   Induction: Intravenous  Airway Management Planned: LMA  Additional Equipment:   Intra-op Plan:   Post-operative Plan: Extubation in OR  Informed Consent: I have reviewed the patients History and Physical, chart, labs and discussed the procedure including the risks, benefits and alternatives for the proposed anesthesia with the patient or authorized representative who has indicated his/her understanding and acceptance.   Dental advisory given  Plan Discussed with: Anesthesiologist, CRNA and Surgeon  Anesthesia Plan Comments:        Anesthesia  Quick Evaluation

## 2016-06-23 NOTE — Anesthesia Postprocedure Evaluation (Signed)
Anesthesia Post Note  Patient: Casey [name] [facility]  Procedure(s) Performed: Procedure(s) (LRB): OPEN LEFT INGUINAL HERNIA  REPAIR WITH MESH (Left) INSERTION OF MESH (Left)  Patient location during evaluation: PACU Anesthesia Type: General Level of consciousness: awake and alert and oriented Pain management: pain level controlled Vital Signs Assessment: post-procedure vital signs reviewed and stable Respiratory status: spontaneous breathing, nonlabored ventilation and respiratory function stable Cardiovascular status: blood pressure returned to baseline and stable Postop Assessment: no signs of nausea or vomiting Anesthetic complications: no    Last Vitals:  Vitals:   06/23/16 1330 06/23/16 1345  BP: 124/75 128/79  Pulse: 83 85  Resp: 16 17  Temp: 36.9 C     Last Pain:  Vitals:   06/23/16 1415  TempSrc:   PainSc: 0-No pain                 Axyl Sitzman A.

## 2016-06-23 NOTE — Interval H&P Note (Signed)
History and Physical Interval Note:  06/23/2016 [id number] AM  Casey [name]  has presented today for surgery, with the diagnosis of LEFT INGUINAL HERNIA  The various methods of treatment have been discussed with the patient and family. After consideration of risks, benefits and other options for treatment, the patient has consented to  Procedure(s): OPEN LEFT INGUINAL HERNIA (Left) as a surgical intervention .  The patient's history has been reviewed, patient examined, no change in status, stable for surgery.  I have reviewed the patient's chart and labs.  Questions were answered to the patient's satisfaction.     Sadi Arave B

## 2016-06-23 NOTE — H&P (View-Only) (Signed)
[name] 0000000 [id number] PM Location: [facility] Patient #: [id number] DOB: 1955/08/13 Married / Language: English / Race: White Male   History of Present Illness Casey Key B. [name] MD; 12/24/2015 5:18 PM) The patient is a 60 year old male who presents with an inguinal hernia. Casey [name] had a right inguinal hernia repair as a child. He has a high riding right testicle. He has had a varicocele on the left. He plays bassoon and tennis weekly. He has noticed more pain recently and on exam he has a fairly prominent bulge on the left. It is reducible. I told him what to look for and think that observation is reasonable. I will see him whenever   Other Problems Casey [name], CMA; 12/24/2015 4:32 PM) Arthritis Atrial Fibrillation Enlarged Prostate Gastroesophageal Reflux Disease Inguinal Hernia Kidney Stone Other disease, cancer, significant illness Umbilical Hernia Repair  Past Surgical History Casey [name], CMA; 12/24/2015 4:32 PM) Lung Surgery Left. Open Inguinal Hernia Surgery Right. Oral Surgery Vasectomy Ventral / Umbilical Hernia Surgery Right.  Diagnostic Studies History Casey [name], CMA; 12/24/2015 4:32 PM) Colonoscopy 1-5 years ago  Allergies Casey [name], CMA; 12/24/2015 4:33 PM) Sulfa Antibiotics Lisinopril *ANTIHYPERTENSIVES*  Medication History Casey [name], CMA; 12/24/2015 4:34 PM) Allopurinol (100MG  Tablet, Oral) Active. Atorvastatin Calcium (10MG  Tablet, Oral) Active. Dilt-XR (240MG  Capsule ER 24HR, Oral) Active. Eplerenone (50MG  Tablet, Oral) Active. Omeprazole (40MG  Capsule DR, Oral) Active. MetFORMIN HCl ER (500MG  Tablet ER 24HR, Oral) Active. Medications Reconciled  Social History Casey [name], CMA; 12/24/2015 4:32 PM) Alcohol use Occasional alcohol use. No caffeine use No drug use Tobacco use Never smoker.  Family History Casey [name], CMA; 12/24/2015 4:32 PM) Arthritis Father, Mother, Sister. Breast Cancer  Sister. Depression Mother, Son. Heart Disease Mother, Sister. Heart disease in male family member before age 15 Hypertension Mother. Thyroid problems Mother.    Review of Systems (Casey [name]; 12/24/2015 4:32 PM) General Present- Appetite Loss and Weight Loss. Not Present- Chills, Fatigue, Fever, Night Sweats and Weight Gain. Skin Not Present- Change in Wart/Mole, Dryness, Hives, Jaundice, New Lesions, Non-Healing Wounds, Rash and Ulcer. HEENT Present- Hearing Loss and Wears glasses/contact lenses. Not Present- Earache, Hoarseness, Nose Bleed, Oral Ulcers, Ringing in the Ears, Seasonal Allergies, Sinus Pain, Sore Throat, Visual Disturbances and Yellow Eyes. Cardiovascular Present- Leg Cramps. Not Present- Chest Pain, Difficulty Breathing Lying Down, Palpitations, Rapid Heart Rate, Shortness of Breath and Swelling of Extremities. Gastrointestinal Present- Gets full quickly at meals and Indigestion. Not Present- Abdominal Pain, Bloating, Bloody Stool, Change in Bowel Habits, Chronic diarrhea, Constipation, Difficulty Swallowing, Excessive gas, Hemorrhoids, Nausea, Rectal Pain and Vomiting. Male Genitourinary Present- Impotence and Urine Leakage. Not Present- Blood in Urine, Change in Urinary Stream, Frequency, Nocturia, Painful Urination and Urgency. Musculoskeletal Present- Joint Stiffness. Not Present- Back Pain, Joint Pain, Muscle Pain, Muscle Weakness and Swelling of Extremities.  Vitals (Casey [name] CMA; 12/24/2015 4:33 PM) 12/24/2015 4:32 PM Weight: 243 lb Height: 73in Body Surface Area: 2.34 m Body Mass Index: 32.06 kg/m  Temp.: 28F(Temporal)  Pulse: 80 (Regular)  BP: 134/82 (Sitting, Left Arm, Standard)       Physical Exam (Casey [name] B. [name] MD; 12/24/2015 5:19 PM) Male Genitourinary Note: Left inguinal hernia that is reducilble.     Assessment & Plan Casey Key B. [name] MD; 12/24/2015 5:21 PM) LEFT INGUINAL HERNIA (K40.90)

## 2016-06-23 NOTE — Discharge Instructions (Addendum)
Information for Discharge Teaching: EXPAREL (bupivacaine liposome injectable suspension)   Your surgeon gave you EXPAREL(bupivacaine) in your surgical incision to help control your pain after surgery.   EXPAREL is a local anesthetic that provides pain relief by numbing the tissue around the surgical site.  EXPAREL is designed to release pain medication over time and can control pain for up to 72 hours.  Depending on how you respond to EXPAREL, you may require less pain medication during your recovery.  Possible side effects:  Temporary loss of sensation or ability to move in the area where bupivacaine was injected.  Nausea, vomiting, constipation  Rarely, numbness and tingling in your mouth or lips, lightheadedness, or anxiety may occur.  Call your doctor right away if you think you may be experiencing any of these sensations, or if you have other questions regarding possible side effects.   May shower in the AM Take pain meds as needed Avoid constipation Wear scrotal support Expect some scrotal swelling and ecchymosis  Follow all other discharge instructions given to you by your surgeon or nurse. Eat a healthy diet and drink plenty of water or other fluids.  If you return to the hospital for any reason within 96 hours following the administration of EXPAREL, please inform your health care providers.   Call your surgeon if you experience:   1.  Fever over 101.0. 2.  Inability to urinate. 3.  Nausea and/or vomiting. 4.  Extreme swelling or bruising at the surgical site. 5.  Continued bleeding from the incision. 6.  Increased pain, redness or drainage from the incision. 7.  Problems related to your pain medication. 8.  Any problems and/or concerns    Post Anesthesia Home Care Instructions  Activity: Get plenty of rest for the remainder of the day. A responsible adult should stay with you for 24 hours following the procedure.  For the next 24 hours, DO NOT: -Drive a  car -Paediatric nurse -Drink alcoholic beverages -Take any medication unless instructed by your physician -Make any legal decisions or sign important papers.  Meals: Start with liquid foods such as gelatin or soup. Progress to regular foods as tolerated. Avoid greasy, spicy, heavy foods. If nausea and/or vomiting occur, drink only clear liquids until the nausea and/or vomiting subsides. Call your physician if vomiting continues.  Special Instructions/Symptoms: Your throat may feel dry or sore from the anesthesia or the breathing tube placed in your throat during surgery. If this causes discomfort, gargle with warm salt water. The discomfort should disappear within 24 hours.  If you had a scopolamine patch placed behind your ear for the management of post- operative nausea and/or vomiting:  1. The medication in the patch is effective for 72 hours, after which it should be removed.  Wrap patch in a tissue and discard in the trash. Wash hands thoroughly with soap and water. 2. You may remove the patch earlier than 72 hours if you experience unpleasant side effects which may include dry mouth, dizziness or visual disturbances. 3. Avoid touching the patch. Wash your hands with soap and water after contact with the patch.

## 2016-06-23 NOTE — Anesthesia Procedure Notes (Signed)
Procedure Name: Intubation Date/Time: 06/23/2016 11:26 AM Performed by: Daeron Carreno D Pre-anesthesia Checklist: Patient identified, Emergency Drugs available, Suction available and Patient being monitored Patient Re-evaluated:Patient Re-evaluated prior to inductionOxygen Delivery Method: Circle system utilized Preoxygenation: Pre-oxygenation with 100% oxygen Intubation Type: IV induction Ventilation: Mask ventilation without difficulty Tube type: Oral Number of attempts: 1 Airway Equipment and Method: Stylet and Oral airway Placement Confirmation: ETT inserted through vocal cords under direct vision,  positive ETCO2 and breath sounds checked- equal and bilateral Tube secured with: Tape Dental Injury: Teeth and Oropharynx as per pre-operative assessment

## 2016-06-23 NOTE — Op Note (Addendum)
Surgeon: Kaylyn Lim, MD, FACS  Asst:  None  Anes:  Gen. by LMA  Procedure: Open repair of chronically incarcerated (sigmoid colon) left inguinal hernia repair with mesh  Diagnosis: Left indirect sliding hernia containing an incarcerated loop of sigmoid colon the portion of which involved the distal wall of the hernia sac  Complications: None  EBL:   12 cc  Drains: None  Description of Procedure:  The patient was taken to OR 8 at [facility].  After anesthesia was administered and the patient was prepped a timeout was performed.  The left inguinal region had a large bulge that went down into the scrotum. I made an oblique incision and dissected down to this obviously large hernia that went out through the external ring into the scrotum. I opened the external ring fibers and then dissected more distally around the cord structures and placed a Penrose drain around them. The cord at this point was firm and hard like a large sausage ballooning distally. I went up toward the ring and opened it and found a sigmoid colon that was completely looped contained within this hernia had portion of it was the wall of the hernia and that the part that made the dissection off the cord structures tricky and tedious but I accomplished this with careful dissection eventually getting it freed it which time I closed the opening in the hernia sac that I had made to get in and dissecting's and then tucked this up inside the abdomen. The ring was broad based and tightening the ring consistent be somewhat difficult. I elected to place a 2-0 Prolene medially to approximate the transversalis fascia and then laterally try to snug this as well.  I then cut a piece of Marlex mesh to fit and sutured in a running fashion along the inguinal ligament carrying it up above the cord. This was sewn medially through the internal oblique and then laterally I brought it down over the cord structures. I took the inferior leaf and actually sewed it  up to the internal oblique again to try to make the internal ring more snug. I brought the other side down and affixed it with a horizontal mattress suture 2-0 Prolene. I checked the opening and was able to insert the tip of my finger into the mesh outlet.  These were tucked beneath the external oblique which was enclosed with a running suture of 2-0 Vicryl. Ex Brill diluted to 30 mL was injected into the operative site. Irrigation again was performed. Sponge and needle counts reported as correct. The wound was closed with layers in layers with 4-0 Vicryl and with a running subcuticular  4-0 Monocryl and Dermabond.  The patient tolerated the procedure well and was taken to the PACU in stable condition.     Matt B. [name], [place], [facility], [place]

## 2016-06-23 NOTE — Transfer of Care (Signed)
Immediate Anesthesia Transfer of Care Note  Patient: Casey [name] [facility]  Procedure(s) Performed: Procedure(s): OPEN LEFT INGUINAL HERNIA WITH MESH (Left) INSERTION OF MESH (Left)  Patient Location: PACU  Anesthesia Type:General  Level of Consciousness: awake and patient cooperative  Airway & Oxygen Therapy: Patient Spontanous Breathing and Patient connected to face mask oxygen  Post-op Assessment: Report given to RN and Post -op Vital signs reviewed and stable  Post vital signs: Reviewed and stable  Last Vitals:  Vitals:   06/23/16 0938  BP: 122/82  Pulse: 71  Resp: 20  Temp: 36.6 C    Last Pain:  Vitals:   06/23/16 0938  TempSrc: Oral         Complications: No apparent anesthesia complications

## 2016-06-24 ENCOUNTER — Encounter (HOSPITAL_BASED_OUTPATIENT_CLINIC_OR_DEPARTMENT_OTHER): Payer: Self-pay | Admitting: Surgery

## 2016-08-09 HISTORY — PX: BACK SURGERY: SHX140

## 2016-08-16 ENCOUNTER — Ambulatory Visit (HOSPITAL_COMMUNITY): Payer: Self-pay | Admitting: Psychiatry

## 2016-08-27 NOTE — Progress Notes (Signed)
[place] MD/PA/NP OP Progress Note  09/01/2016 0000000 AM Casey [name]  MRN:  [id number]  Chief Complaint:  Chief Complaint    Depression; Follow-up     Subjective:  "I felt exhausted" HPI:  He comes here for follow up appointment. He states that he felt exhausted last weekend; he underwent two surgery since the last visit and he believes that his exhaustion is secondary to anesthesia. (He has not taken any pain medication over the two weeks). He talks about his frustration about the medical system and the charting system. He denies any behavioral issues, although he raises his voice at times when he was frustrated. He denies insomnia. He denies SI. He enjoys weekend activity with his family. He does skype with Dr. Calla Kicks every other week. He denies headache (which he reports after started on sertraline).   Visit Diagnosis:    ICD-9-CM ICD-10-CM   1. Adjustment disorder with depressed mood 309.0 F43.21     Past Psychiatric History:  Outpatient: used to see a therapist, 20 years ago in the setting of divorce and having challenge with his child Psychiatry admission: denies Previous suicide attempt: denies Past trials of medication: denies  Past Medical History:  Past Medical History:  Diagnosis Date  . 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HCC)    ongoing evaluation for elevated R>L adosterone secretion  . Arthritis   . Atrial enlargement, bilateral   . Atrial fibrillation (HCC)    paroxysmal  . Atrial flutter ([place])   . Complication of anesthesia    could not urinate after UHR, needed cath  . Dysrhythmia    a-fib-had ablation x2  . GERD (gastroesophageal reflux disease)   . Hypertension   . Kidney cysts    "multiple"  . Nephrolithiasis   . Pneumonia 1990's  . Pre-diabetes   . Prostatitis   . Renal insufficiency    "Cr 1.6"    Past Surgical History:  Procedure Laterality Date  . ATRIAL FIBRILLATION ABLATION N/A 09/28/2012   Procedure: ATRIAL FIBRILLATION ABLATION;  Surgeon: Thompson Grayer, MD;  Location: [facility] CATH LAB;  Service: Cardiovascular;  Laterality: N/A;  . atrial fibrillation ablation with CTI ablation  05/27/11, 09/28/12   ablation for afib and atrial flutter by Dr Rayann Heman  . CARDIOVERSION  07/28/2011   Procedure: CARDIOVERSION;  Surgeon: Lelon Perla, MD;  Location: [place];  Service: Cardiovascular;  Laterality: N/A;  . COLONOSCOPY WITH PROPOFOL N/A 03/03/2015   Procedure: COLONOSCOPY WITH PROPOFOL;  Surgeon: Garlan Fair, MD;  Location: [facility] ENDOSCOPY;  Service: Gastroenterology;  Laterality: N/A;  . double j stent placement  06/2004   left  . ESOPHAGOGASTRODUODENOSCOPY (EGD) WITH PROPOFOL N/A 03/03/2015   Procedure: ESOPHAGOGASTRODUODENOSCOPY (EGD) WITH PROPOFOL;  Surgeon: Garlan Fair, MD;  Location: [facility] ENDOSCOPY;  Service: Gastroenterology;  Laterality: N/A;  . INGUINAL HERNIA REPAIR     "as a child; ? right"  . INGUINAL HERNIA REPAIR Left 06/23/2016   Procedure: OPEN LEFT INGUINAL HERNIA  REPAIR WITH MESH;  Surgeon: Johnathan Hausen, MD;  Location: [place];  Service: General;  Laterality: Left;  . INSERTION OF MESH Left 06/23/2016   Procedure: INSERTION OF MESH;  Surgeon: Johnathan Hausen, MD;  Location: [place];  Service: General;  Laterality: Left;  . LUNG BIOPSY  1993  . NASAL SEPTOPLASTY W/ TURBINOPLASTY  07/2006  . prostate biopsy  2011  . schwannoma removal  ~01/2010   left thorax  . TEE WITHOUT CARDIOVERSION N/A 09/27/2012   Procedure: TRANSESOPHAGEAL  ECHOCARDIOGRAM (TEE);  Surgeon: Larey Dresser, MD;  Location: [facility] ENDOSCOPY;  Service: Cardiovascular;  Laterality: N/A;  . UMBILICAL HERNIA REPAIR  07/2006    Family Psychiatric History: Son- "inadequate personality disorder"  Family History:  Family History  Problem Relation Age of Onset  . Atrial fibrillation      sister has had 2 afib ablations    Social History:  Social History   Social History  . Marital status: Married    Spouse name: N/A  . Number of  children: N/A  . Years of education: N/A   Social History Main Topics  . Smoking status: Never Smoker  . Smokeless tobacco: Never Used  . Alcohol use 1.2 oz/week    2 Shots of liquor per week     Comment: occasional, 05-11-2016 Per pt 3 drinks per wk  . Drug use: No     Comment: 05-11-2016 per pt no  . Sexual activity: Yes   Other Topics Concern  . 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    Allergies:  Allergies  Allergen Reactions  . Codeine Other (See Comments)    Headache   . Nexium [Esomeprazole Magnesium] Other (See Comments)    Diarrhea   . Lisinopril Cough  . Sulfa Drugs Cross Reactors Rash    Metabolic Disorder Labs: No results found for: HGBA1C, MPG No results found for: PROLACTIN No results found for: CHOL, TRIG, HDL, CHOLHDL, VLDL, LDLCALC   Current Medications: Current Outpatient Prescriptions  Medication Sig Dispense Refill  . allopurinol (ZYLOPRIM) 100 MG tablet Take 100 mg by mouth 2 (two) times daily.     Marland Kitchen atorvastatin (LIPITOR) 10 MG tablet Take 10 mg by mouth daily.      Marland Kitchen diltiazem (DILACOR XR) 240 MG 24 hr capsule Take 360 mg by mouth every evening.     Marland Kitchen eplerenone (INSPRA) 50 MG tablet Take 50 mg by mouth 2 (two) times daily.     . hydrochlorothiazide (MICROZIDE) 12.5 MG capsule Take 12.5 mg by mouth daily.    Marland Kitchen ibuprofen (ADVIL,MOTRIN) 200 MG tablet Take 200 mg by mouth every 6 (six) hours as needed for pain.    Marland Kitchen losartan (COZAAR) 50 MG tablet Take 100 mg by mouth daily.  10  . metFORMIN (GLUCOPHAGE) 1000 MG tablet Take 1,000 mg by mouth daily.    Marland Kitchen omeprazole (PRILOSEC) 40 MG capsule Take 40 mg by mouth every evening.    . sertraline (ZOLOFT) 50 MG tablet Take 1 tablet (50 mg total) by mouth at bedtime. 30 tablet 2  . tadalafil (CIALIS) 10 MG tablet Take 10 mg by mouth daily as needed for erectile dysfunction.    Marland Kitchen HYDROcodone-acetaminophen (NORCO) 7.5-325 MG tablet Take 1 tablet by mouth once as needed (for pain  score 1-4). (Patient not taking: Reported on 09/01/2016) 30 tablet 0   No current facility-administered medications for this visit.     Neurologic: Headache: No Seizure: No Paresthesias: No  Musculoskeletal: Strength & Muscle Tone: within normal limits Gait & Station: normal Patient leans: N/A  Psychiatric Specialty Exam: Review of Systems  Psychiatric/Behavioral: Positive for depression. Negative for hallucinations, substance abuse and suicidal ideas. The patient is nervous/anxious. The patient does not have insomnia.   All other systems reviewed and are negative.   Blood pressure 132/78, pulse 87, height 6\' 1"  (1.854 m), weight 240 lb (108.9 kg).Body mass index is 31.66 kg/m.  General Appearance: Well Groomed  Eye Contact:  Good  Speech:  Clear and Coherent  Volume:  Normal  Mood:  "good"  Affect:  slightly down  Thought Process:  Coherent  Orientation:  Full (Time, Place, and Person)  Thought Content: Logical  Perceptions: denies AH/VH  Suicidal Thoughts:  No  Homicidal Thoughts:  No  Memory:  Immediate;   Good Recent;   Good Remote;   Good  Judgement:  Good  Insight:  Good  Psychomotor Activity:  Normal  Concentration:  Concentration: Good and Attention Span: Good  Recall:  Good  Fund of Knowledge: Good  Language: Good  Akathisia:  No  Handed:    AIMS (if indicated):  N/A  Assets:  Communication Skills Desire for Improvement  ADL's:  Intact  Cognition: WNL  Sleep:  good   Assessment Casey [name] is  61 year old male with no psychiatry history, Afib, GERD, hypertension, renal insufficiency who originally presented for mental health evaluation after making a threatening comment at work. Psychosocial stressors include work environment and loss of his mother.  # Adjustment disorder with depressed mood He endorses significant fatigue since the last encounter; although it is difficult to discern the cause given recent surgery, he also reports significant stress  at work. Will continue the current dose of sertraline and will consider uptitration in the future if he continues to experience symptoms. He sees Dr. Lorretta Harp for therapy.   Plan 1. Continue sertraline 50 mg daily 2. Recommended the book: What Doctors Feel: How Emotions Affect the Practice of Medicine, by Rosaura Carpenter 3. Return to clinic in 3 months  The patient demonstrates the following risk factors for suicide: Chronic risk factors for suicide include: psychiatric disorder of depression and demographic factors (male, >37 y/o). Acute risk factors for suicide include: loss (financial, interpersonal, professional). Protective factors for this patient include: positive social support, responsibility to others (children, family), coping skills, hope for the future and religious beliefs against suicide. Considering these factors, the overall suicide risk at this point appears to be low. Patient is appropriate for outpatient follow up.  Treatment Plan Summary:Plan as above  Norman Clay, MD 09/01/2016, 8:53 AM

## 2016-09-01 ENCOUNTER — Ambulatory Visit (INDEPENDENT_AMBULATORY_CARE_PROVIDER_SITE_OTHER): Payer: 59 | Admitting: Psychiatry

## 2016-09-01 ENCOUNTER — Encounter (HOSPITAL_COMMUNITY): Payer: Self-pay | Admitting: Psychiatry

## 2016-09-01 VITALS — BP 132/78 | HR 87 | Ht 73.0 in | Wt 240.0 lb

## 2016-09-01 DIAGNOSIS — F4321 Adjustment disorder with depressed mood: Secondary | ICD-10-CM | POA: Diagnosis not present

## 2016-09-01 DIAGNOSIS — Z9889 Other specified postprocedural states: Secondary | ICD-10-CM

## 2016-09-01 DIAGNOSIS — Z888 Allergy status to other drugs, medicaments and biological substances status: Secondary | ICD-10-CM

## 2016-09-01 DIAGNOSIS — Z79899 Other long term (current) drug therapy: Secondary | ICD-10-CM

## 2016-09-01 DIAGNOSIS — Z8249 Family history of ischemic heart disease and other diseases of the circulatory system: Secondary | ICD-10-CM

## 2016-09-01 DIAGNOSIS — Z882 Allergy status to sulfonamides status: Secondary | ICD-10-CM

## 2016-09-01 MED ORDER — SERTRALINE HCL 50 MG PO TABS: 50.0000 mg | ORAL_TABLET | Freq: Every day | ORAL | 2 refills | Status: DC

## 2016-09-01 NOTE — Patient Instructions (Addendum)
1. Continue sertraline 50 mg daily 2. The book I would recommend: What Doctors Feel: How Emotions Affect the Practice of Medicine, by Rosaura Carpenter 3. Return to clinic in 3 months

## 2016-09-14 ENCOUNTER — Other Ambulatory Visit: Payer: Self-pay | Admitting: Internal Medicine

## 2016-09-14 DIAGNOSIS — R140 Abdominal distension (gaseous): Secondary | ICD-10-CM

## 2016-10-01 ENCOUNTER — Ambulatory Visit
Admission: RE | Admit: 2016-10-01 | Discharge: 2016-10-01 | Disposition: A | Discharge status: Home / Self Care | Payer: Managed Care, Other (non HMO) | Source: Ambulatory Visit | Attending: Internal Medicine | Admitting: Internal Medicine

## 2016-10-01 DIAGNOSIS — R140 Abdominal distension (gaseous): Secondary | ICD-10-CM

## 2016-10-15 ENCOUNTER — Other Ambulatory Visit: Payer: Self-pay | Admitting: Gastroenterology

## 2016-10-18 ENCOUNTER — Ambulatory Visit
Admission: RE | Admit: 2016-10-18 | Discharge: 2016-10-18 | Disposition: A | Discharge status: Home / Self Care | Payer: Managed Care, Other (non HMO) | Source: Ambulatory Visit | Attending: Surgery | Admitting: Surgery

## 2016-10-18 ENCOUNTER — Other Ambulatory Visit: Payer: Self-pay | Admitting: Surgery

## 2016-10-18 DIAGNOSIS — R109 Unspecified abdominal pain: Secondary | ICD-10-CM

## 2016-10-19 ENCOUNTER — Encounter (HOSPITAL_COMMUNITY): Payer: Self-pay | Admitting: *Deleted

## 2016-10-20 ENCOUNTER — Encounter (HOSPITAL_COMMUNITY): Payer: Self-pay | Admitting: *Deleted

## 2016-10-20 NOTE — Progress Notes (Signed)
ALL PRE OP INSTRUCTIONS FAXED TO DR [name] PER HIS REQUEST FAX NUMBER [phone] FAX CONFIRMATION RCEIVED AND PLACED ON CHART

## 2016-10-20 NOTE — Progress Notes (Signed)
Your procedure is scheduled DC:VUDTH[place] 10-25-16 Report to [place] at:1100 am Call this number if you have problems morning of your procedure:[phone]  Follow all bowel prep instructions per your doctor's orders.  Do not eat anything after midnight the night before your procedure. YOU MAY HAVE CLEAR LIQUIDS FROM MIDNIGHT UNTIL 700 AM DAY OF YOUR PROCEDURE 10-25-16, NOTHING BY MOUTH AFTRE 700 AM DAY OF YOUR PROCEDURE.     Take these medicines the morning of your procedure with A SIP OF WATER:ALLOPURINOL, NO METFORMIN AM 10-25-16   Please make arrangements for a responsible person to drive you home after the procedure. You cannot go home by cab/taxi. We recommend you have someone with you at home the first 24 hours after your procedure. Driver for procedure is PATIENT'S WIFE  LEAVE ALL VALUABLES, JEWELRY, BILLFOLD AT HOME.  NO DENTURES, CONTACT LENSES ALLOWED IN THE ENDOSCOPY ROOM.   YOU MAY WEAR DEODORANT, PLEASE REMOVE ALL JEWELRY, WATCHES RINGS, BODY PIERCINGS AND LEAVE AT HOME.   MEN MAY SHAVE FACE.   CALL ME IF QUESTIONS FAX SENT BY [facility] RN, BSN PHONE [phone] FAX [phone] [facility]

## 2016-10-25 ENCOUNTER — Encounter (HOSPITAL_COMMUNITY): Payer: Self-pay | Admitting: *Deleted

## 2016-10-25 ENCOUNTER — Encounter (HOSPITAL_COMMUNITY)
Admission: RE | Disposition: A | Discharge status: Home / Self Care | Payer: Self-pay | Source: Ambulatory Visit | Attending: Gastroenterology

## 2016-10-25 ENCOUNTER — Ambulatory Visit (HOSPITAL_COMMUNITY)
Admission: RE | Admit: 2016-10-25 | Discharge: 2016-10-25 | Disposition: A | Discharge status: Home / Self Care | Payer: Managed Care, Other (non HMO) | Source: Ambulatory Visit | Attending: Gastroenterology | Admitting: Gastroenterology

## 2016-10-25 ENCOUNTER — Ambulatory Visit (HOSPITAL_COMMUNITY): Payer: Managed Care, Other (non HMO) | Admitting: Anesthesiology

## 2016-10-25 DIAGNOSIS — E2609 Other primary hyperaldosteronism: Secondary | ICD-10-CM | POA: Insufficient documentation

## 2016-10-25 DIAGNOSIS — Z7984 Long term (current) use of oral hypoglycemic drugs: Secondary | ICD-10-CM | POA: Insufficient documentation

## 2016-10-25 DIAGNOSIS — N281 Cyst of kidney, acquired: Secondary | ICD-10-CM | POA: Insufficient documentation

## 2016-10-25 DIAGNOSIS — J449 Chronic obstructive pulmonary disease, unspecified: Secondary | ICD-10-CM | POA: Diagnosis not present

## 2016-10-25 DIAGNOSIS — R6881 Early satiety: Secondary | ICD-10-CM | POA: Diagnosis present

## 2016-10-25 DIAGNOSIS — Z79899 Other long term (current) drug therapy: Secondary | ICD-10-CM | POA: Insufficient documentation

## 2016-10-25 DIAGNOSIS — Z87442 Personal history of urinary calculi: Secondary | ICD-10-CM | POA: Diagnosis not present

## 2016-10-25 DIAGNOSIS — I4891 Unspecified atrial fibrillation: Secondary | ICD-10-CM | POA: Insufficient documentation

## 2016-10-25 DIAGNOSIS — Z9889 Other specified postprocedural states: Secondary | ICD-10-CM | POA: Diagnosis not present

## 2016-10-25 DIAGNOSIS — I10 Essential (primary) hypertension: Secondary | ICD-10-CM | POA: Diagnosis not present

## 2016-10-25 DIAGNOSIS — K317 Polyp of stomach and duodenum: Secondary | ICD-10-CM | POA: Diagnosis not present

## 2016-10-25 DIAGNOSIS — Z882 Allergy status to sulfonamides status: Secondary | ICD-10-CM | POA: Insufficient documentation

## 2016-10-25 DIAGNOSIS — E1151 Type 2 diabetes mellitus with diabetic peripheral angiopathy without gangrene: Secondary | ICD-10-CM | POA: Insufficient documentation

## 2016-10-25 DIAGNOSIS — Z7982 Long term (current) use of aspirin: Secondary | ICD-10-CM | POA: Insufficient documentation

## 2016-10-25 DIAGNOSIS — Z888 Allergy status to other drugs, medicaments and biological substances status: Secondary | ICD-10-CM | POA: Insufficient documentation

## 2016-10-25 DIAGNOSIS — M1990 Unspecified osteoarthritis, unspecified site: Secondary | ICD-10-CM | POA: Diagnosis not present

## 2016-10-25 HISTORY — PX: ESOPHAGOGASTRODUODENOSCOPY (EGD) WITH PROPOFOL: SHX5813

## 2016-10-25 LAB — GLUCOSE, CAPILLARY: GLUCOSE-CAPILLARY: 84 mg/dL (ref 65–99)

## 2016-10-25 SURGERY — ESOPHAGOGASTRODUODENOSCOPY (EGD) WITH PROPOFOL
Anesthesia: Monitor Anesthesia Care

## 2016-10-25 MED ORDER — PROPOFOL 10 MG/ML IV BOLUS
INTRAVENOUS | Status: AC
  Filled 2016-10-25: qty 40

## 2016-10-25 MED ORDER — PROPOFOL 500 MG/50ML IV EMUL
INTRAVENOUS | Status: DC | PRN
  Administered 2016-10-25: 150 ug/kg/min via INTRAVENOUS

## 2016-10-25 MED ORDER — SODIUM CHLORIDE 0.9 % IV SOLN: INTRAVENOUS | Status: DC

## 2016-10-25 MED ORDER — LACTATED RINGERS IV SOLN
INTRAVENOUS | Status: DC
  Administered 2016-10-25: 1000 mL via INTRAVENOUS

## 2016-10-25 MED ORDER — PROPOFOL 500 MG/50ML IV EMUL
INTRAVENOUS | Status: DC | PRN
  Administered 2016-10-25: 10 mg via INTRAVENOUS
  Administered 2016-10-25: 60 mg via INTRAVENOUS

## 2016-10-25 SURGICAL SUPPLY — 15 items

## 2016-10-25 NOTE — Anesthesia Postprocedure Evaluation (Signed)
Anesthesia Post Note  Patient: Casey [name] [facility]  Procedure(s) Performed: Procedure(s) (LRB): ESOPHAGOGASTRODUODENOSCOPY (EGD) WITH PROPOFOL (N/A)  Patient location during evaluation: Endoscopy Anesthesia Type: MAC Level of consciousness: awake and alert Pain management: pain level controlled Vital Signs Assessment: post-procedure vital signs reviewed and stable Respiratory status: spontaneous breathing, nonlabored ventilation, respiratory function stable and patient connected to nasal cannula oxygen Cardiovascular status: stable and blood pressure returned to baseline Anesthetic complications: no       Last Vitals:  Vitals:   10/25/16 1210 10/25/16 1220  BP: 115/81 125/80  Pulse: 61 62  Resp: (!) 22 17  Temp:      Last Pain:  Vitals:   10/25/16 1200  TempSrc: Oral                 Catalina Gravel

## 2016-10-25 NOTE — Transfer of Care (Signed)
Immediate Anesthesia Transfer of Care Note  Patient: Casey [name] [facility]  Procedure(s) Performed: Procedure(s): ESOPHAGOGASTRODUODENOSCOPY (EGD) WITH PROPOFOL (N/A)  Patient Location: PACU  Anesthesia Type:MAC  Level of Consciousness: awake, alert  and oriented  Airway & Oxygen Therapy: Patient Spontanous Breathing and Patient connected to nasal cannula oxygen  Post-op Assessment: Report given to RN and Post -op Vital signs reviewed and stable  Post vital signs: Reviewed and stable  Last Vitals:  Vitals:   10/25/16 1055  BP: 135/76  Resp: 16  Temp: 36.2 C    Last Pain:  Vitals:   10/25/16 1055  TempSrc: Oral         Complications: No apparent anesthesia complications

## 2016-10-25 NOTE — Discharge Instructions (Signed)

## 2016-10-25 NOTE — Anesthesia Preprocedure Evaluation (Signed)
Anesthesia Evaluation  Patient identified by MRN, date of birth, ID band Patient awake  General Assessment Comment:Urinary retention post op  Reviewed: Allergy & Precautions, NPO status , Patient's Chart, lab work & pertinent test results  History of Anesthesia Complications (+) history of anesthetic complications ("urinary retention")  Airway Mallampati: III  TM Distance: >3 FB Neck ROM: Full    Dental no notable dental hx. (+) Teeth Intact   Pulmonary    Pulmonary exam normal breath sounds clear to auscultation       Cardiovascular hypertension, Pt. on medications Normal cardiovascular exam+ dysrhythmias (s/p ablation, cardioversions) Atrial Fibrillation  Rhythm:Regular Rate:Normal     Neuro/Psych PSYCHIATRIC DISORDERS Depression s/p ACDF    GI/Hepatic GERD  Medicated and Controlled,  Endo/Other  diabetes, Well Controlled, Type 2, Oral Hypoglycemic AgentsObesity H/o adrenocortical hyperfunction  Renal/GU Renal InsufficiencyRenal diseaseMultiple renal cysts   Hx/o prostatitis    Musculoskeletal  (+) Arthritis , Osteoarthritis,    Abdominal (+) + obese,   Peds  Hematology negative hematology ROS (+)   Anesthesia Other Findings   Reproductive/Obstetrics                             Anesthesia Physical  Anesthesia Plan  ASA: II  Anesthesia Plan: MAC   Post-op Pain Management:    Induction: Intravenous  Airway Management Planned: Nasal Cannula  Additional Equipment:   Intra-op Plan:   Post-operative Plan:   Informed Consent: I have reviewed the patients History and Physical, chart, labs and discussed the procedure including the risks, benefits and alternatives for the proposed anesthesia with the patient or authorized representative who has indicated his/her understanding and acceptance.   Dental advisory given  Plan Discussed with: Anesthesiologist, CRNA and  Surgeon  Anesthesia Plan Comments: (Discussed risks/benefits/alternatives to MAC sedation including need for ventilatory support, hypotension, need for conversion to general anesthesia.  All patient questions answered.  Patient/guardian wishes to proceed.)        Anesthesia Quick Evaluation

## 2016-10-25 NOTE — Op Note (Signed)
[facility] Patient Name: Casey [name] Procedure Date: 10/25/2016 MRN: [id number] Attending MD: Garlan Fair , MD Date of Birth: Sep 29, 1955 CSN: [id number] Age: 62 Admit Type: Outpatient Procedure:                Upper GI endoscopy Indications:              Early satiety Providers:                Garlan Fair, MD, Zenon Mayo, RN, Corliss Parish, Technician Referring MD:              Medicines:                Propofol per Anesthesia Complications:            No immediate complications. Estimated Blood Loss:     Estimated blood loss was minimal. Procedure:                Pre-Anesthesia Assessment:                           - Prior to the procedure, a History and Physical                            was performed, and patient medications and                            allergies were reviewed. The patient's tolerance of                            previous anesthesia was also reviewed. The risks                            and benefits of the procedure and the sedation                            options and risks were discussed with the patient.                            All questions were answered, and informed consent                            was obtained. Prior Anticoagulants: The patient has                            taken no previous anticoagulant or antiplatelet                            agents. ASA Grade Assessment: II - A patient with                            mild systemic disease. After reviewing the risks  and benefits, the patient was deemed in                            satisfactory condition to undergo the procedure.                           After obtaining informed consent, the endoscope was                            passed under direct vision. Throughout the                            procedure, the patient's blood pressure, pulse, and                            oxygen saturations were  monitored continuously. The                            Endoscope was introduced through the mouth, and                            advanced to the second part of duodenum. The upper                            GI endoscopy was accomplished without difficulty.                            The patient tolerated the procedure well. Scope In: Scope Out: Findings:      The Z-line was regular and was found 45 cm from the incisors.      The examined esophagus was normal.      The entire examined stomach was normal. Scattered 66mm-3mm sized benign       fundic gland appearing sessile polyps associated with chronic PPI       therapy were present. Biopsies were taken with a cold forceps for       histology.      The examined duodenum was normal. Impression:               - Z-line regular, 45 cm from the incisors.                           - Normal esophagus.                           - Normal stomach. Biopsied.                           - Normal examined duodenum. Moderate Sedation:      N/A- Per Anesthesia Care Recommendation:           - Patient has a contact number available for                            emergencies. The signs and symptoms of potential  delayed complications were discussed with the                            patient. Return to normal activities tomorrow.                            Written discharge instructions were provided to the                            patient.                           - Await pathology results.                           - Resume previous diet.                           - Continue present medications. Procedure Code(s):        --- Professional ---                           [id number], Esophagogastroduodenoscopy, flexible,                            transoral; with biopsy, single or multiple Diagnosis Code(s):        --- Professional ---                           R68.81, Early satiety CPT copyright 2016 American Medical Association.  All rights reserved. The codes documented in this report are preliminary and upon coder review may  be revised to meet current compliance requirements. Earle Gell, MD Garlan Fair, MD 10/25/2016 11:53:31 AM This report has been signed electronically. Number of Addenda: 0

## 2016-10-25 NOTE — H&P (Signed)
Procedure: Diagnostic esophagogastroduodenoscopy to evaluate early satiety without anorexia  History: The patient is a 61 year old male born [birth year]. He has been experiencing early satiety without abdominal pain or anorexia. 03/03/2015 esophagogastroduodenoscopy was performed with removal of a 5 mm pedunculated inflammatory polyp from the second portion of the duodenum distal to the major papilla. The exam was otherwise normal.  He is scheduled to undergo repeat diagnostic esophagogastroduodenoscopy today.  Past medical history: Inguinal hernia repair. Lung biopsy. Umbilical hernia repair. Atrial fibrillation ablation. Hypertension. Mild obstructive airways disease. Kidney stones. Renal cysts. Primary aldosteronism. Peripheral vascular disease.  Medication allergies: Lisinopril caused cough. Sulfa drugs cause rash. Spironolactone caused gynecomastia.  Exam: The patient is alert and lying comfortably on the endoscopy stretcher. Abdomen is soft and nontender to palpation. Lungs are clear to auscultation. Cardiac exam reveals a regular rhythm.  Plan: Proceed with diagnostic esophagogastroduodenoscopy with screen for H. pylori gastritis.

## 2016-10-27 ENCOUNTER — Encounter (HOSPITAL_COMMUNITY): Payer: Self-pay | Admitting: Gastroenterology

## 2016-11-03 ENCOUNTER — Other Ambulatory Visit (HOSPITAL_COMMUNITY): Payer: Self-pay | Admitting: Gastroenterology

## 2016-11-03 DIAGNOSIS — R6881 Early satiety: Secondary | ICD-10-CM

## 2016-11-23 ENCOUNTER — Ambulatory Visit (HOSPITAL_COMMUNITY)
Admission: RE | Admit: 2016-11-23 | Discharge: 2016-11-23 | Disposition: A | Discharge status: Home / Self Care | Payer: Managed Care, Other (non HMO) | Source: Ambulatory Visit | Attending: Gastroenterology | Admitting: Gastroenterology

## 2016-11-23 DIAGNOSIS — R6881 Early satiety: Secondary | ICD-10-CM | POA: Diagnosis present

## 2016-11-23 MED ORDER — TECHNETIUM TC 99M SULFUR COLLOID
2.1000 | Freq: Once | INTRAVENOUS | Status: AC | PRN
  Administered 2016-11-23: 2.1 via INTRAVENOUS

## 2016-12-08 ENCOUNTER — Ambulatory Visit (HOSPITAL_COMMUNITY): Payer: Self-pay | Admitting: Psychiatry

## 2016-12-13 ENCOUNTER — Other Ambulatory Visit (HOSPITAL_COMMUNITY): Payer: Self-pay | Admitting: Psychiatry

## 2016-12-13 ENCOUNTER — Telehealth (HOSPITAL_COMMUNITY): Payer: Self-pay

## 2016-12-13 MED ORDER — SERTRALINE HCL 50 MG PO TABS: 50.0000 mg | ORAL_TABLET | Freq: Every evening | ORAL | 2 refills | Status: DC

## 2016-12-13 NOTE — Telephone Encounter (Signed)
Ordered with two refills

## 2017-02-01 ENCOUNTER — Ambulatory Visit (INDEPENDENT_AMBULATORY_CARE_PROVIDER_SITE_OTHER): Payer: 59 | Admitting: Psychiatry

## 2017-02-01 ENCOUNTER — Encounter (HOSPITAL_COMMUNITY): Payer: Self-pay | Admitting: Psychiatry

## 2017-02-01 VITALS — BP 126/74 | HR 70 | Ht 73.0 in | Wt 239.6 lb

## 2017-02-01 DIAGNOSIS — F4321 Adjustment disorder with depressed mood: Secondary | ICD-10-CM

## 2017-02-01 MED ORDER — SERTRALINE HCL 50 MG PO TABS: 50.0000 mg | ORAL_TABLET | Freq: Every evening | ORAL | 1 refills | Status: DC

## 2017-02-01 NOTE — Progress Notes (Signed)
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 We reviewed the recent events from October whereby he had an outburst at work. We spent time discussing a sense of moral injury, and his ideas about himself, and about his career. Spent time educating the patient about the rates of physician burnout and depression, and encouraging ongoing therapy and mindfulness meditation. He reports that he has been practicing mindfulness with his therapist, and is agreeable to additional homework on this. He continues to have a good relationship with his wife. Reports that he continues to develop a relationship with his children who he has become more close with her for the past 1 year. He continues to reflect on his frustrations with the medical system, especially electronic medical record, which is a major source of burnout for him.  He denies any aggressive thoughts or any desire to harm himself or anybody else. He agrees to follow-up in 3 months. Agrees to continue on Zoloft, as it does appear to be improving his frustration tolerance.  Visit Diagnosis:    ICD-10-CM   1. Adjustment disorder with depressed mood F43.21     Past Psychiatric History: See intake H&P for full details. Reviewed, with no updates at this time.  Past Medical History:  Past Medical History:  Diagnosis Date  . 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HCC)    ongoing evaluation for elevated R>L adosterone secretion  . Arthritis   . Atrial enlargement, bilateral   . Atrial fibrillation (HCC)    paroxysmal  . Atrial flutter ([place])   . Complication of anesthesia    could not urinate after UHR, needed cath, mental fog sinc cervical fusion 08-09-16   . Dysrhythmia    a-fib-had ablation x2  . GERD (gastroesophageal reflux disease)   . Hypertension   . Kidney cysts     "multiple"  . Nephrolithiasis   . Pneumonia 1990's  . Pre-diabetes   . Prostatitis   . Renal insufficiency    "Cr 1.6"    Past Surgical History:  Procedure Laterality Date  . ATRIAL FIBRILLATION ABLATION N/A 09/28/2012   Procedure: ATRIAL FIBRILLATION ABLATION;  Surgeon: Thompson Grayer, MD;  Location[facility] CATH LAB;  Service: Cardiovascular;  Laterality: N/A;  . atrial fibrillation ablation with CTI ablation  05/27/11, 09/28/12   ablation for afib and atrial flutter by Dr Rayann Heman  . BACK SURGERY  08/09/2016   C 5 6  C 6 to C 7 cervical fusion  . CARDIOVERSION  07/28/2011   Procedure: CARDIOVERSION;  Surgeon: Lelon Perla, MD;  Location: [place];  Service: Cardiovascular;  Laterality: N/A;  . COLONOSCOPY WITH PROPOFOL N/A 03/03/2015   Procedure: COLONOSCOPY WITH PROPOFOL;  Surgeon: Garlan Fair, MD;  Location: [facility] ENDOSCOPY;  Service: Gastroenterology;  Laterality: N/A;  . double j stent placement  06/2004   left  . ESOPHAGOGASTRODUODENOSCOPY (EGD) WITH PROPOFOL N/A 03/03/2015   Procedure: ESOPHAGOGASTRODUODENOSCOPY (EGD) WITH PROPOFOL;  Surgeon: Garlan Fair, MD;  Location: [facility] ENDOSCOPY;  Service: Gastroenterology;  Laterality: N/A;  . ESOPHAGOGASTRODUODENOSCOPY (EGD) WITH PROPOFOL N/A 10/25/2016   Procedure: ESOPHAGOGASTRODUODENOSCOPY (EGD) WITH PROPOFOL;  Surgeon: Garlan Fair, MD;  Location: [facility] ENDOSCOPY;  Service: Endoscopy;  Laterality: N/A;  . INGUINAL HERNIA REPAIR     "as a child; ? right"  . INGUINAL HERNIA REPAIR Left 06/23/2016   Procedure:  OPEN LEFT INGUINAL HERNIA  REPAIR WITH MESH;  Surgeon: Johnathan Hausen, MD;  Location: [place];  Service: General;  Laterality: Left;  . INSERTION OF MESH Left 06/23/2016   Procedure: INSERTION OF MESH;  Surgeon: Johnathan Hausen, MD;  Location: [place];  Service: General;  Laterality: Left;  . LUNG BIOPSY  1993  . NASAL SEPTOPLASTY W/ TURBINOPLASTY  07/2006  . prostate biopsy  2011  . schwannoma removal   ~01/2010   left thorax  . TEE WITHOUT CARDIOVERSION N/A 09/27/2012   Procedure: TRANSESOPHAGEAL ECHOCARDIOGRAM (TEE);  Surgeon: Larey Dresser, MD;  Location: [facility] ENDOSCOPY;  Service: Cardiovascular;  Laterality: N/A;  . UMBILICAL HERNIA REPAIR  07/2006    Family Psychiatric History: See intake H&P for full details. Reviewed, with no updates at this time.   Family History:  Family History  Problem Relation Age of Onset  . Atrial fibrillation Unknown        sister has had 2 afib ablations    Social History:  Social History   Social History  . Marital status: Married    Spouse name: N/A  . Number of children: N/A  . Years of education: N/A   Social History Main Topics  . Smoking status: Never Smoker  . Smokeless tobacco: Never Used  . Alcohol use 1.2 oz/week    2 Shots of liquor per week     Comment: occasional, 05-11-2016 Per pt 3 drinks per wk  . Drug use: No     Comment: 05-11-2016 per pt no  . Sexual activity: Yes   Other Topics Concern  . 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    Allergies:  Allergies  Allergen Reactions  . Aldactone [Spironolactone]     Breast tissue enlargement  . Codeine Other (See Comments)    Headache   . Nexium [Esomeprazole Magnesium] Other (See Comments)    Diarrhea   . Lisinopril Cough  . Sulfa Drugs Cross Reactors Rash    Metabolic Disorder Labs: No results found for: HGBA1C, MPG No results found for: PROLACTIN No results found for: CHOL, TRIG, HDL, CHOLHDL, VLDL, LDLCALC   Current Medications: Current Outpatient Prescriptions  Medication Sig Dispense Refill  . allopurinol (ZYLOPRIM) 100 MG tablet Take 100 mg by mouth 2 (two) times daily.     Marland Kitchen aspirin EC 81 MG tablet Take 81 mg by mouth every evening.    Marland Kitchen atorvastatin (LIPITOR) 10 MG tablet Take 10 mg by mouth every evening.     Marland Kitchen CIALIS 20 MG tablet Take 20 mg by mouth daily as needed for erectile dysfunction.   11  . diltiazem (CARDIZEM CD)  360 MG 24 hr capsule Take 360 mg by mouth every evening.  7  . eplerenone (INSPRA) 50 MG tablet Take 50 mg by mouth 2 (two) times daily.     . hydrochlorothiazide (MICROZIDE) 12.5 MG capsule Take 12.5 mg by mouth every evening.     Marland Kitchen ibuprofen (ADVIL,MOTRIN) 200 MG tablet Take 200 mg by mouth daily as needed for headache or moderate pain.     Marland Kitchen losartan (COZAAR) 100 MG tablet Take 100 mg by mouth every evening.    . metFORMIN (GLUCOPHAGE-XR) 500 MG 24 hr tablet Take 500 mg by mouth 2 (two) times daily.    Marland Kitchen omeprazole (PRILOSEC) 40 MG capsule Take 40 mg by mouth daily before supper.     . sertraline (ZOLOFT) 50 MG tablet Take 1 tablet (  50 mg total) by mouth every evening. 90 tablet 1  . solifenacin (VESICARE) 5 MG tablet Take 5 mg by mouth daily.    Marland Kitchen HYDROcodone-acetaminophen (NORCO) 7.5-325 MG tablet Take 1 tablet by mouth once as needed (for pain score 1-4). (Patient not taking: Reported on 09/01/2016) 30 tablet 0   No current facility-administered medications for this visit.     Neurologic: Headache: Negative Seizure: Negative Paresthesias: Negative  Musculoskeletal: Strength & Muscle Tone: within normal limits Gait & Station: normal Patient leans: Front  Psychiatric Specialty Exam: ROS  Blood pressure 126/74, pulse 70, height 6\' 1"  (1.854 m), weight 108.7 kg (239 lb 9.6 oz).Body mass index is 31.61 kg/m.  General Appearance: Casual and Well Groomed  Eye Contact:  Good  Speech:  Clear and Coherent  Volume:  Normal  Mood:  Euthymic  Affect:  Appropriate and Congruent  Thought Process:  Coherent and Goal Directed  Orientation:  Full (Time, Place, and Person)  Thought Content: Logical   Suicidal Thoughts:  No  Homicidal Thoughts:  No  Memory:  Immediate;   Good  Judgement:  Good  Insight:  Good  Psychomotor Activity:  Normal  Concentration:  Concentration: Good  Recall:  Good  Fund of Knowledge: Good  Language: Good  Akathisia:  NA  Handed:  Right  AIMS (if indicated):   0  Assets:  Communication Skills Desire for Improvement Financial Resources/Insurance Housing Intimacy Leisure Time Physical Health Resilience Social Support Talents/Skills Transportation Vocational/Educational  ADL's:  Intact  Cognition: WNL  Sleep:  6-7 hours     Treatment Plan Summary: 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  He continues to work with his individual therapist and coach weekly.  We spent time reviewing the circumstances by which he was referred to mental health in October, and processed some of the ongoing shame and embarrassment he has surrounding this issue. He continues to have some anxiety and apprehensiveness about being in the hospital from a sense of embarrassment. He does not have any thoughts to harm anybody or himself, and presents as quite introspective during our discussion. It does appear that Zoloft has helped with improving his frustration tolerance. He is agreeable to mindfulness meditation homework that if given him today. We will follow-up in 3 months or sooner if needed.  1. Adjustment disorder with depressed mood     Continue Zoloft 50 mg daily Continue in individual coaching weekly Return to clinic in 3 months or sooner if needed  Aundra Dubin, MD 02/01/2017, 8:59 AM

## 2017-05-04 ENCOUNTER — Ambulatory Visit (INDEPENDENT_AMBULATORY_CARE_PROVIDER_SITE_OTHER): Payer: 59 | Admitting: Psychiatry

## 2017-05-04 DIAGNOSIS — F4321 Adjustment disorder with depressed mood: Secondary | ICD-10-CM | POA: Diagnosis not present

## 2017-05-04 DIAGNOSIS — Z79899 Other long term (current) drug therapy: Secondary | ICD-10-CM | POA: Diagnosis not present

## 2017-05-04 MED ORDER — SERTRALINE HCL 50 MG PO TABS: 50.0000 mg | ORAL_TABLET | Freq: Every evening | ORAL | 1 refills | Status: DC

## 2017-05-04 NOTE — Progress Notes (Signed)
[facility] MD/PA/NP OP Progress Note  05/04/2017 6:43 AM Casey [name]  MRN:  [id number]  Chief Complaint: follow-up  HPI: Casey [name] reports that things are going well in terms of his mood and with work. No significant issues at work in terms of interpersonal interactions. We spent time processing some of the psychodynamics of him getting closer to retirement age, and reflecting on his past values and medicine, and what he hopes to leave behind. He reports that he is sleeping well at night, feels like he is able to continue to enjoy his work, but is also reflecting on likely retirement in the next year.   I spent time with him considering a homework assignment, what he would say in a retirement speech, what music he would play to go with that speech, who he would address. He was receptive to this. We spent time discussing some of his distaste for bureaucracy, and his values of high quality patient care, and the characteristics of a good physician.  He denies any safety issues or substance abuse. Reports that his relationship with his wife has been excellent, and he tends to be more even keeled in terms of his anxiety and temperament. We agreed to continue Zoloft at the current dose.  Visit Diagnosis:    ICD-10-CM   1. Adjustment disorder with depressed mood F43.21     Past Psychiatric History: See intake H&P for full details. Reviewed, with no updates at this time.   Past Medical History:  Past Medical History:  Diagnosis Date  . 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HCC)    ongoing evaluation for elevated R>L adosterone secretion  . Arthritis   . Atrial enlargement, bilateral   . Atrial fibrillation (HCC)    paroxysmal  . Atrial flutter ([place])   . Complication of anesthesia    could not urinate after UHR, needed cath, mental fog sinc cervical fusion 08-09-16   . Dysrhythmia    a-fib-had ablation x2  . GERD (gastroesophageal reflux disease)   . Hypertension   . Kidney cysts    "multiple"  . Nephrolithiasis   . Pneumonia 1990's  . Pre-diabetes   . Prostatitis   . Renal insufficiency    "Cr 1.6"    Past Surgical History:  Procedure Laterality Date  . ATRIAL FIBRILLATION ABLATION N/A 09/28/2012   Procedure: ATRIAL FIBRILLATION ABLATION;  Surgeon: Thompson Grayer, MD;  Location: [facility] CATH LAB;  Service: Cardiovascular;  Laterality: N/A;  . atrial fibrillation ablation with CTI ablation  05/27/11, 09/28/12   ablation for afib and atrial flutter by Dr Rayann Heman  . BACK SURGERY  08/09/2016   C 5 6  C 6 to C 7 cervical fusion  . CARDIOVERSION  07/28/2011   Procedure: CARDIOVERSION;  Surgeon: Lelon Perla, MD;  Location: [facility];  Service: Cardiovascular;  Laterality: N/A;  . COLONOSCOPY WITH PROPOFOL N/A 03/03/2015   Procedure: COLONOSCOPY WITH PROPOFOL;  Surgeon: Garlan Fair, MD;  Location: [facility] ENDOSCOPY;  Service: Gastroenterology;  Laterality: N/A;  . double j stent placement  06/2004   left  . ESOPHAGOGASTRODUODENOSCOPY (EGD) WITH PROPOFOL N/A 03/03/2015   Procedure: ESOPHAGOGASTRODUODENOSCOPY (EGD) WITH PROPOFOL;  Surgeon: Garlan Fair, MD;  Location: [facility] ENDOSCOPY;  Service: Gastroenterology;  Laterality: N/A;  . ESOPHAGOGASTRODUODENOSCOPY (EGD) WITH PROPOFOL N/A 10/25/2016   Procedure: ESOPHAGOGASTRODUODENOSCOPY (EGD) WITH PROPOFOL;  Surgeon: Garlan Fair, MD;  Location: [facility] ENDOSCOPY;  Service: Endoscopy;  Laterality: N/A;  . INGUINAL HERNIA REPAIR     "as a child; ?  right"  . INGUINAL HERNIA REPAIR Left 06/23/2016   Procedure: OPEN LEFT INGUINAL HERNIA  REPAIR WITH MESH;  Surgeon: Johnathan Hausen, MD;  Location: [facility];  Service: General;  Laterality: Left;  . INSERTION OF MESH Left 06/23/2016   Procedure: INSERTION OF MESH;  Surgeon: Johnathan Hausen, MD;  Location: [facility];  Service: General;  Laterality: Left;  . LUNG BIOPSY  1993  . NASAL SEPTOPLASTY W/ TURBINOPLASTY  07/2006  . prostate biopsy  2011  . schwannoma removal   ~01/2010   left thorax  . TEE WITHOUT CARDIOVERSION N/A 09/27/2012   Procedure: TRANSESOPHAGEAL ECHOCARDIOGRAM (TEE);  Surgeon: Larey Dresser, MD;  Location: [name] ENDOSCOPY;  Service: Cardiovascular;  Laterality: N/A;  . UMBILICAL HERNIA REPAIR  07/2006    Family Psychiatric History: See intake H&P for full details. Reviewed, with no updates at this time.   Family History:  Family History  Problem Relation Age of Onset  . Atrial fibrillation Unknown        sister has had 2 afib ablations    Social History:  Social History   Social History  . Marital status: Married    Spouse name: N/A  . Number of children: N/A  . Years of education: N/A   Social History Main Topics  . Smoking status: Never Smoker  . Smokeless tobacco: Never Used  . Alcohol use 1.2 oz/week    2 Shots of liquor per week     Comment: occasional, 05-11-2016 Per pt 3 drinks per wk  . Drug use: No     Comment: 05-11-2016 per pt no  . Sexual activity: Yes   Other Topics Concern  . 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    Allergies:  Allergies  Allergen Reactions  . Aldactone [Spironolactone]     Breast tissue enlargement  . Codeine Other (See Comments)    Headache   . Nexium [Esomeprazole Magnesium] Other (See Comments)    Diarrhea   . Lisinopril Cough  . Sulfa Drugs Cross Reactors Rash    Metabolic Disorder Labs: No results found for: HGBA1C, MPG No results found for: PROLACTIN No results found for: CHOL, TRIG, HDL, CHOLHDL, VLDL, LDLCALC No results found for: TSH  Therapeutic Level Labs: No results found for: LITHIUM No results found for: VALPROATE No components found for:  CBMZ  Current Medications: Current Outpatient Prescriptions  Medication Sig Dispense Refill  . allopurinol (ZYLOPRIM) 100 MG tablet Take 100 mg by mouth 2 (two) times daily.     Marland Kitchen aspirin EC 81 MG tablet Take 81 mg by mouth every evening.    Marland Kitchen atorvastatin (LIPITOR) 10 MG tablet Take  10 mg by mouth every evening.     Marland Kitchen CIALIS 20 MG tablet Take 20 mg by mouth daily as needed for erectile dysfunction.   11  . diltiazem (CARDIZEM CD) 360 MG 24 hr capsule Take 360 mg by mouth every evening.  7  . eplerenone (INSPRA) 50 MG tablet Take 50 mg by mouth 2 (two) times daily.     . hydrochlorothiazide (MICROZIDE) 12.5 MG capsule Take 12.5 mg by mouth every evening.     Marland Kitchen HYDROcodone-acetaminophen (NORCO) 7.5-325 MG tablet Take 1 tablet by mouth once as needed (for pain score 1-4). (Patient not taking: Reported on 09/01/2016) 30 tablet 0  . ibuprofen (ADVIL,MOTRIN) 200 MG tablet Take 200 mg by mouth daily as needed for headache or moderate pain.     Marland Kitchen  losartan (COZAAR) 100 MG tablet Take 100 mg by mouth every evening.    . metFORMIN (GLUCOPHAGE-XR) 500 MG 24 hr tablet Take 500 mg by mouth 2 (two) times daily.    Marland Kitchen omeprazole (PRILOSEC) 40 MG capsule Take 40 mg by mouth daily before supper.     . sertraline (ZOLOFT) 50 MG tablet Take 1 tablet (50 mg total) by mouth every evening. 90 tablet 1  . solifenacin (VESICARE) 5 MG tablet Take 5 mg by mouth daily.     No current facility-administered medications for this visit.      Musculoskeletal: Strength & Muscle Tone: within normal limits Gait & Station: normal Patient leans: N/A  Psychiatric Specialty Exam: Review of Systems  Constitutional: Negative.   HENT: Negative.   Respiratory: Negative.   Cardiovascular: Negative.   Gastrointestinal: Negative.   Musculoskeletal: Negative.   Neurological: Negative.   Psychiatric/Behavioral: Negative.     There were no vitals taken for this visit.There is no height or weight on file to calculate BMI.  General Appearance: Casual and Well Groomed  Eye Contact:  Good  Speech:  Clear and Coherent  Volume:  Normal  Mood:  Euthymic  Affect:  Congruent  Thought Process:  Coherent, Goal Directed and Linear  Orientation:  Full (Time, Place, and Person)  Thought Content: Logical   Suicidal  Thoughts:  No  Homicidal Thoughts:  No  Memory:  Recent;   Good  Judgement:  Good  Insight:  Good  Psychomotor Activity:  Normal  Concentration:  Concentration: Good  Recall:  Good  Fund of Knowledge: Good  Language: Good  Akathisia:  Negative  Handed:  Right  AIMS (if indicated): not done  Assets:  Communication Skills Desire for Improvement Financial Resources/Insurance Housing Intimacy Leisure Time Physical Health Resilience Social Support Talents/Skills Transportation Vocational/Educational  ADL's:  Intact  Cognition: WNL  Sleep:  Good   Screenings:   Assessment and Plan: 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 He has had a continued recovery in terms of his sense of embarrassment from the recent conflict at work. He appears to be nearing retirement age and is reflecting on his past and contributions, and how medicine has changed. Zoloft appears to be sustaining a good effect for him, and he continues to participate in positive coping strategies, music, walking, and family time. We will follow-up in 3 months or sooner if needed.  1. Adjustment disorder with depressed mood      Continue Zoloft 50 mg daily Return to clinic in 3 months or sooner if needed We discussed a homework assignment to consider, writing a retirement speech and sharing this with his wife, and Probation officer if he feels inclined to do so  I spent 30 minutes with the patient in direct care and counseling. We discussed his mood symptoms, medication management, and processed some of the thoughts and feelings associated with aging as a physician.      Aundra Dubin, MD 05/04/2017, 8:52 AM

## 2017-05-04 NOTE — Patient Instructions (Signed)
Homework Assignment:  Write your retirement speech, and who you would be addressing. Can be as short or as long as you like.  Share this with your wife over dinner

## 2017-05-27 ENCOUNTER — Encounter (HOSPITAL_COMMUNITY): Payer: Self-pay

## 2017-05-27 ENCOUNTER — Telehealth: Payer: Self-pay | Admitting: Physician Assistant

## 2017-05-27 ENCOUNTER — Emergency Department (HOSPITAL_COMMUNITY)
Admission: EM | Admit: 2017-05-27 | Discharge: 2017-05-27 | Disposition: A | Discharge status: Home / Self Care | Payer: Managed Care, Other (non HMO) | Attending: Emergency Medicine | Admitting: Emergency Medicine

## 2017-05-27 ENCOUNTER — Encounter: Payer: Self-pay | Admitting: Nurse Practitioner

## 2017-05-27 DIAGNOSIS — I10 Essential (primary) hypertension: Secondary | ICD-10-CM | POA: Diagnosis not present

## 2017-05-27 DIAGNOSIS — R002 Palpitations: Secondary | ICD-10-CM | POA: Diagnosis present

## 2017-05-27 DIAGNOSIS — I480 Paroxysmal atrial fibrillation: Secondary | ICD-10-CM | POA: Insufficient documentation

## 2017-05-27 DIAGNOSIS — I4891 Unspecified atrial fibrillation: Secondary | ICD-10-CM

## 2017-05-27 HISTORY — DX: Hyperaldosteronism, unspecified: E26.9

## 2017-05-27 LAB — BASIC METABOLIC PANEL
Anion gap: 8 (ref 5–15)
BUN: 25 mg/dL — AB (ref 6–20)
CHLORIDE: 112 mmol/L — AB (ref 101–111)
CO2: 20 mmol/L — AB (ref 22–32)
Calcium: 9.1 mg/dL (ref 8.9–10.3)
Creatinine, Ser: 1.67 mg/dL — ABNORMAL HIGH (ref 0.61–1.24)
GFR calc non Af Amer: 42 mL/min — ABNORMAL LOW (ref >60)
GFR, EST AFRICAN AMERICAN: 49 mL/min — AB (ref >60)
Glucose, Bld: 100 mg/dL — ABNORMAL HIGH (ref 65–99)
POTASSIUM: 3.2 mmol/L — AB (ref 3.5–5.1)
SODIUM: 140 mmol/L (ref 135–145)

## 2017-05-27 LAB — CBC
HEMATOCRIT: 42.4 % (ref 39.0–52.0)
Hemoglobin: 13.7 g/dL (ref 13.0–17.0)
MCH: 28.5 pg (ref 26.0–34.0)
MCHC: 32.3 g/dL (ref 30.0–36.0)
MCV: 88.1 fL (ref 78.0–100.0)
Platelets: 245 10*3/uL (ref 150–400)
RBC: 4.81 MIL/uL (ref 4.22–5.81)
RDW: 15.9 % — ABNORMAL HIGH (ref 11.5–15.5)
WBC: 10.3 10*3/uL (ref 4.0–10.5)

## 2017-05-27 LAB — I-STAT TROPONIN, ED: Troponin i, poc: 0.03 ng/mL (ref 0.00–0.08)

## 2017-05-27 MED ORDER — POTASSIUM CHLORIDE ER 10 MEQ PO TBCR: 10.0000 meq | EXTENDED_RELEASE_TABLET | Freq: Two times a day (BID) | ORAL | 0 refills | Status: DC

## 2017-05-27 MED ORDER — DEXTROSE 5 % IV SOLN
5.0000 mg/h | INTRAVENOUS | Status: DC
  Filled 2017-05-27: qty 100

## 2017-05-27 MED ORDER — RIVAROXABAN 15 MG PO TABS: 15.0000 mg | ORAL_TABLET | Freq: Once | ORAL | Status: DC

## 2017-05-27 MED ORDER — DILTIAZEM LOAD VIA INFUSION
20.0000 mg | Freq: Once | INTRAVENOUS | Status: DC
  Filled 2017-05-27: qty 20

## 2017-05-27 MED ORDER — RIVAROXABAN 20 MG PO TABS: 20.0000 mg | ORAL_TABLET | Freq: Every day | ORAL | 0 refills | Status: DC

## 2017-05-27 MED ORDER — RIVAROXABAN 20 MG PO TABS
20.0000 mg | ORAL_TABLET | Freq: Once | ORAL | Status: AC
  Administered 2017-05-27: 20 mg via ORAL
  Filled 2017-05-27: qty 1

## 2017-05-27 MED ORDER — SODIUM CHLORIDE 0.9 % IV BOLUS (SEPSIS)
1000.0000 mL | Freq: Once | INTRAVENOUS | Status: AC
  Administered 2017-05-27: 1000 mL via INTRAVENOUS

## 2017-05-27 MED ORDER — POTASSIUM CHLORIDE CRYS ER 20 MEQ PO TBCR: 20.0000 meq | EXTENDED_RELEASE_TABLET | Freq: Two times a day (BID) | ORAL | 0 refills | Status: DC

## 2017-05-27 MED ORDER — MIDAZOLAM HCL 2 MG/2ML IJ SOLN
2.0000 mg | Freq: Once | INTRAMUSCULAR | Status: AC
  Administered 2017-05-27: 1 mg via INTRAVENOUS
  Filled 2017-05-27: qty 2

## 2017-05-27 MED ORDER — RIVAROXABAN (XARELTO) VTE STARTER PACK (15 & 20 MG): ORAL_TABLET | ORAL | 0 refills | Status: DC

## 2017-05-27 MED ORDER — POTASSIUM CHLORIDE CRYS ER 20 MEQ PO TBCR
40.0000 meq | EXTENDED_RELEASE_TABLET | Freq: Once | ORAL | Status: AC
  Administered 2017-05-27: 40 meq via ORAL
  Filled 2017-05-27: qty 2

## 2017-05-27 MED ORDER — PROPOFOL 10 MG/ML IV BOLUS
200.0000 mg | Freq: Once | INTRAVENOUS | Status: AC
  Administered 2017-05-27: 50 mg via INTRAVENOUS
  Filled 2017-05-27: qty 20

## 2017-05-27 NOTE — ED Notes (Signed)
Pt placed on zoll pads for cardioversion

## 2017-05-27 NOTE — ED Provider Notes (Signed)
[facility] EMERGENCY DEPARTMENT Provider Note   CSN: [id number] Arrival date & time: 05/27/17  1228     History   Chief Complaint Chief Complaint  Patient presents with  . Palpitations  . 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 Chief complaint is palpitations, atrial fibrillation.  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 His history of atrial fibrillation status post ablation 2. He follows with Dr. Marlou Porch, and Dr. Vanice Sarah of [facility] cardiology.  His last ablation was 6 years ago. He has not had episodes of palpitations or AF since that time. Last night he played 3 sets of tennis. Felt well. At 745 afterwards he felt palpitations immediately and felt as though he is likely in A. fib. Symptoms persisted today. He ate last night. He is nothing by mouth today. He contacted his physician and was directed here.  He has no chest pain. He is not dyspneic. He is subjectively aware of his palpitations. He checked his rhythm at home and assume that his heart rate was "about 110".  No PE risk. Structural normal heart from previous TEE. No chest pain or anginal symptoms. He exerts without difficulty.  No history of thyroid disorders. He is a nonsmoker. He takes Cardizem for blood pressure control. He is not on frank rate control. He is not on anticoagulation.  Past Medical History:  Diagnosis Date  . Atrial fibrillation ([place])   . GERD (gastroesophageal reflux disease)   . Hyperaldosteronism ([place])   . Hypertension     There are no active problems to display for this patient.   Past Surgical History:  Procedure Laterality Date  . ABLATION         Home Medications    Prior to Admission medications   Medication Sig Start Date End Date Taking? Authorizing Provider  potassium chloride (K-DUR) 10 MEQ tablet Take 1 tablet (10 mEq total) by mouth 2 (two) times daily. 05/27/17   Tanna Furry, MD  potassium chloride  SA (K-DUR,KLOR-CON) 20 MEQ tablet Take 1 tablet (20 mEq total) by mouth 2 (two) times daily. 05/27/17   Tanna Furry, MD  rivaroxaban (XARELTO) 20 MG TABS tablet Take 1 tablet (20 mg total) by mouth daily with supper. 05/27/17   Tanna Furry, MD    Family History Family History  Problem Relation Age of Onset  . Atrial fibrillation Sister     Social History Social History  Substance Use Topics  . Smoking status: Never Smoker  . Smokeless tobacco: Not on file  . Alcohol use Yes     Comment: occ     Allergies   Lisinopril; Nexium [esomeprazole magnesium]; and Sulfa antibiotics   Review of Systems Review of Systems  Constitutional: Negative for appetite change, chills, diaphoresis, fatigue and fever.  HENT: Negative for mouth sores, sore throat and trouble swallowing.   Eyes: Negative for visual disturbance.  Respiratory: Negative for cough, chest tightness, shortness of breath and wheezing.   Cardiovascular: Positive for palpitations. Negative for chest pain.  Gastrointestinal: Negative for abdominal distention, abdominal pain, diarrhea, nausea and vomiting.  Endocrine: Negative for polydipsia, polyphagia and polyuria.  Genitourinary: Negative for dysuria, frequency and hematuria.  Musculoskeletal: Negative for gait problem.  Skin: Negative for color change, pallor and rash.  Neurological: Negative for dizziness, syncope, light-headedness and headaches.  Hematological: Does not bruise/bleed easily.  Psychiatric/Behavioral: Negative for behavioral problems and confusion.     Physical Exam Updated  Vital Signs BP 134/90   Pulse 71   Temp 98 F (36.7 C) (Oral)   Resp 19   Ht 6\' 1"  (1.854 m)   Wt 108.9 kg (240 lb)   SpO2 98%   BMI 31.66 kg/m   Physical Exam  Constitutional: He is oriented to person, place, and time. He appears well-developed and well-nourished. No distress.  HENT:  Head: Normocephalic.  Eyes: Pupils are equal, round, and reactive to light. Conjunctivae  are normal. No scleral icterus.  Neck: Normal range of motion. Neck supple. No thyromegaly present.  Cardiovascular: Exam reveals no gallop and no friction rub.   No murmur heard. A. fib at 1:30 on the monitor. Clear lungs. No distress. Not diaphoretic. No leg swelling.  Pulmonary/Chest: Effort normal and breath sounds normal. No respiratory distress. He has no wheezes. He has no rales.  Abdominal: Soft. Bowel sounds are normal. He exhibits no distension. There is no tenderness. There is no rebound.  Musculoskeletal: Normal range of motion.  Neurological: He is alert and oriented to person, place, and time.  Skin: Skin is warm and dry. No rash noted.  Psychiatric: He has a normal mood and affect. His behavior is normal.     ED Treatments / Results  Labs (all labs ordered are listed, but only abnormal results are displayed) Labs Reviewed  BASIC METABOLIC PANEL - Abnormal; Notable for the following:       Result Value   Potassium 3.2 (*)    Chloride 112 (*)    CO2 20 (*)    Glucose, Bld 100 (*)    BUN 25 (*)    Creatinine, Ser 1.67 (*)    GFR calc non Af Amer 42 (*)    GFR calc Af Amer 49 (*)    All other components within normal limits  CBC - Abnormal; Notable for the following:    RDW 15.9 (*)    All other components within normal limits  I-STAT TROPONIN, ED    EKG  EKG Interpretation  Date/Time:  Friday May 27 2017 12:40:37 EDT Ventricular Rate:  146 PR Interval:    QRS Duration: 84 QT Interval:  320 QTC Calculation: 498 R Axis:   -79 Text Interpretation:  Atrial fibrillation with rapid ventricular response Left axis deviation Inferior infarct , age undetermined Anterolateral infarct , age undetermined Abnormal ECG Confirmed by Tanna Furry [phone]) on 05/27/2017 12:51:59 PM       Radiology No results found.  Procedures .Sedation Date/Time: 05/27/2017 3:41 PM Performed by: Jarrod Mcenery, [place] by: Tanna Furry   Consent:    Consent obtained:  Verbal and  written   Consent given by:  Patient   Risks discussed:  Allergic reaction, prolonged hypoxia resulting in organ damage, prolonged sedation necessitating reversal and respiratory compromise necessitating ventilatory assistance and intubation   Alternatives discussed:  Analgesia without sedation and anxiolysis Indications:    Procedure performed:  Cardioversion   Intended level of sedation:  Moderate (conscious sedation) Pre-sedation assessment:    NPO status caution: unable to specify NPO status     ASA classification: class 2 - patient with mild systemic disease     Neck mobility: normal     Mouth opening:  2 finger widths   Thyromental distance:  3 finger widths   Mallampati score:  I - soft palate, uvula, fauces, pillars visible   Pre-sedation assessments completed and reviewed: airway patency, cardiovascular function, hydration status, mental status, nausea/vomiting, pain level, respiratory function and temperature  Immediate pre-procedure details:    Reviewed: vital signs, relevant labs/tests and NPO status     Verified: bag valve mask available, emergency equipment available, intubation equipment available, IV patency confirmed, oxygen available, reversal medications available and suction available   Procedure details (see MAR for exact dosages):    Preoxygenation:  Nasal cannula   Sedation:  Propofol and midazolam   Intra-procedure monitoring:  Blood pressure monitoring, cardiac monitor, continuous pulse oximetry, frequent LOC assessments, frequent vital sign checks and continuous capnometry   Intra-procedure events: none     Intra-procedure management:  Airway repositioning   Total Provider sedation time (minutes):  20 Post-procedure details:    Post-sedation assessment completed:  05/27/2017 3:43 PM   Attendance: Constant attendance by certified staff until patient recovered     Recovery: Patient returned to pre-procedure baseline     Post-sedation assessments completed and  reviewed: cardiovascular function, hydration status, mental status, nausea/vomiting, pain level, respiratory function and temperature     Patient tolerance:  Tolerated well, no immediate complications Comments:     Patient cardioverted 1-200 J. Immediate cardioversion to sinus rhythm. Awake from sedation without difficulty.   (including critical care time)  Medications Ordered in ED Medications  diltiazem (CARDIZEM) 1 mg/mL load via infusion 20 mg (20 mg Intravenous Not Given 05/27/17 1355)    And  diltiazem (CARDIZEM) 100 mg in dextrose 5 % 100 mL (1 mg/mL) infusion ( Intravenous Canceled Entry 05/27/17 1355)  sodium chloride 0.9 % bolus 1,000 mL (0 mLs Intravenous Stopped 05/27/17 1512)  propofol (DIPRIVAN) 10 mg/mL bolus/IV push 200 mg (50 mg Intravenous Given 05/27/17 1426)  midazolam (VERSED) injection 2 mg (1 mg Intravenous Given 05/27/17 1423)  potassium chloride SA (K-DUR,KLOR-CON) CR tablet 40 mEq (40 mEq Oral Given 05/27/17 1515)  rivaroxaban (XARELTO) tablet 20 mg (20 mg Oral Given 05/27/17 1515)     Initial Impression / Assessment and Plan / ED Course  I have reviewed the triage vital signs and the nursing notes.  Pertinent labs & imaging results that were available during my care of the patient were reviewed by me and considered in my medical decision making (see chart for details).   1 systolic reading of 96. Second systolic reading 308. Will give a fluid bolus. Cardizem bolus and infusion. I'll discuss him with cardiology.     Final Clinical Impressions(s) / ED Diagnoses   Final diagnoses:  Atrial fibrillation with rapid ventricular response (HCC)    New Prescriptions New Prescriptions   POTASSIUM CHLORIDE (K-DUR) 10 MEQ TABLET    Take 1 tablet (10 mEq total) by mouth 2 (two) times daily.   POTASSIUM CHLORIDE SA (K-DUR,KLOR-CON) 20 MEQ TABLET    Take 1 tablet (20 mEq total) by mouth 2 (two) times daily.   RIVAROXABAN (XARELTO) 20 MG TABS TABLET    Take 1 tablet (20 mg  total) by mouth daily with supper.     Tanna Furry, MD 05/27/17 1544

## 2017-05-27 NOTE — Progress Notes (Signed)
ANTICOAGULATION CONSULT NOTE - Initial Consult  Pharmacy Consult for xarelto Indication: atrial fibrillation  Allergies  Allergen Reactions  . Lisinopril Cough  . Nexium [Esomeprazole Magnesium] Diarrhea  . Sulfa Antibiotics Rash    Patient Measurements: Height: 6\' 1"  (185.4 cm) Weight: 240 lb (108.9 kg) IBW/kg (Calculated) : 79.9  Vital Signs: Temp: 98 F (36.7 C) (11/02 1240) Temp Source: Oral (11/02 1240) BP: 125/86 (11/02 1500) Pulse Rate: 70 (11/02 1500)  Labs:  Recent Labs  05/27/17 1304  HGB 13.7  HCT 42.4  PLT 245  CREATININE 1.67*    Estimated Creatinine Clearance: 59.4 mL/min (A) (by C-G formula based on SCr of 1.67 mg/dL (H)).   Medical History: Past Medical History:  Diagnosis Date  . Atrial fibrillation ([place])   . GERD (gastroesophageal reflux disease)   . Hyperaldosteronism ([place])   . Hypertension    Assessment: 20 yom presented to the ED with new onset afib. Now s/p cardioversion with plans to anticoagulate on xarelto for 4 weeks. Baseline CBC is WNL. SCr is elevated ta 1.67 but CrCl>50. He is not on anticoagulation PTA.   Goal of Therapy:  Monitor platelets by anticoagulation protocol: Yes   Plan:  Xarelto 20mg  PO daily Monitor for bleeding, renal fxn  Daimion Adamcik, Rande Lawman 05/27/2017,3:13 PM

## 2017-05-27 NOTE — Consult Note (Signed)
ELECTROPHYSIOLOGY CONSULT NOTE    Patient ID: Casey [name] MRN: [id number], DOB/AGE: [birth year] 61 y.o.  Admit date: 05/27/2017 Date of Consult: 05/27/2017  Primary Physician: No primary care provider on file. Electrophysiologist: Katiana Ruland  The patient's established MRN is [id number]  Patient Profile: Casey [name] is a 61 y.o. male with a history of PAF who is being seen today for the evaluation of AF with RVR at the request of Casey Jeneen Rinks.  HPI:  Casey [name] is a 61 y.o. male with 2 prior PVI ablations, the last in 2014.  He has done very well for the last 4 years without recurrence of AF.  He developed AF last night after playing tennis and is symptomatic with palpitations and fatigue. He is unaware of any triggers for his AF.  He called the office and was advised to come to the ER for DCCV.  He has been NPO today.  CHADS2VASC is 1 and he is not currently anticoagulated.  He currently denies chest pain, shortness of breath, LE edema, recent fevers or chills. He does drink ETOH but has not had any in the last several days.   TEE 09/2012 demonstrated normal LVEF, mildly dilated LA.    Past Medical History:  Diagnosis Date  . Atrial fibrillation ([place])   . GERD (gastroesophageal reflux disease)   . Hyperaldosteronism ([place])   . Hypertension      Surgical History:  Past Surgical History:  Procedure Laterality Date  . ABLATION        (Not in a hospital admission)  Inpatient Medications:  . diltiazem  20 mg Intravenous Once    Allergies:  Allergies  Allergen Reactions  . Lisinopril Cough  . Nexium [Esomeprazole Magnesium] Diarrhea  . Sulfa Antibiotics Rash    Social History   Social History  . Marital status: Married    Spouse name: N/A  . Number of children: N/A  . Years of education: N/A   Occupational History  . Not on file.   Social History Main Topics  . Smoking status: Never Smoker  . Smokeless tobacco: Not on file  . Alcohol use Yes   Comment: occ  . Drug use: No  . Sexual activity: Not on file   Other Topics Concern  . Not on file   Social History Narrative  . No narrative on file     Family History  Problem Relation Age of Onset  . Atrial fibrillation Sister      Review of Systems: All other systems reviewed and are otherwise negative except as noted above.  Physical Exam: Vitals:   05/27/17 1255 05/27/17 1258 05/27/17 1300 05/27/17 1306  BP: 96/66 114/77    Pulse: (!) 130 (!) 107  (!) 136  Resp: (!) 27 16 (!) 22 (!) 23  Temp:      TempSrc:      SpO2: 97% 96% 96% 96%  Weight:      Height:        GEN- The patient is well appearing, alert and oriented x 3 today.   HEENT: normocephalic, atraumatic; sclera clear, conjunctiva pink; hearing intact; oropharynx clear; neck supple Lungs- Clear to ausculation bilaterally, normal work of breathing.  No wheezes, rales, rhonchi Heart- Tachycardic irregular rate and rhythm  GI- soft, non-tender, non-distended, bowel sounds present Extremities- no clubbing, cyanosis, or edema  MS- no significant deformity or atrophy Skin- warm and dry, no rash or lesion Psych- euthymic mood, full affect Neuro- strength and sensation are intact  Labs:   Lab Results  Component Value Date   WBC 10.3 05/27/2017   HGB 13.7 05/27/2017   HCT 42.4 05/27/2017   MCV 88.1 05/27/2017   PLT 245 05/27/2017   No results for input(s): NA, K, CL, CO2, BUN, CREATININE, CALCIUM, PROT, BILITOT, ALKPHOS, ALT, AST, GLUCOSE in the last 168 hours.  Invalid input(s): LABALBU    Radiology/Studies: No results found.  EKG:AF, V rate 146 (personally reviewed)  TELEMETRY: AF with RVR (personally reviewed)  Assessment/Plan: 1.  Paroxysmal atrial fibrillation S/p ablation x2.  Last in 2014 He has done well for the last 4 years without recurrent AF He is clear that AF began last night.  CHADS2VASC is 1 and he is not currently on [place] I think most reasonable approach at this point is to  proceed with DCCV and monitor for AF recurrence. If he has recurrent AF, can consider AAD therapy (he has been on Flecainide and Tikosyn in the past). Discussed with Casey Rayann Heman who agrees.  Will need 4 weeks of [place] with DCCV   Plan follow up in AF clinic next week  Signed, Chanetta Marshall 05/27/2017 1:34 PM  I have seen, examined the patient, and reviewed the above assessment and plan.  Changes to above are made where necessary.  On exam, RRR.  Pt with return of afib after 4 years of sinus rhythm.  Advise ED cardioversion and discharge to home.  Resume xarelto 20mg  daily x 4 weeks.  Follow-up in AF clinic.  I have spoken by phone with patient's wife as well.    Co Sign: Thompson Grayer, MD 05/27/2017 3:12 PM

## 2017-05-27 NOTE — Telephone Encounter (Signed)
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 Last seen in 2015 for h/o PAF, also hx of atrial flutter, doing well since that time. CHADSVASC previously 1 so not on anticoagulation. He was playing tennis yesterday and felt himself go into atrial fib with similar symptoms he'd had in the past with palpitations/rapid irregular HR. He reports HR in the 100s. Has not checked BP but reports prior pressure 125/86 several days ago when seen by nephrology. He otherwise has no CP, SOB, diaphoresis or any other unusual symptoms lately. I told him I would speak with on-call EP and get back with him. I discussed with Dr. Curt [name]; given short duration of atrial fib and known onset of symptoms would have him present to the ER to consider DCCV if appropriate based on labs and clinical findings. I called patient back and let him know. 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 Will send to Dr. Rayann [name] to make him aware. Will forward this message to triage to follow up ER course; if pt is discharged from ED, please arrange f/u appointment with afib clinic or EP APP within 1 week and call patient with this appt.  Casey Dunn PA-C

## 2017-05-27 NOTE — Discharge Instructions (Addendum)
Xarelto 20 mg once per day. KDur (Potassium) bid for 5 days. Dr. Rayann Heman will contact you for out patient appointment.

## 2017-05-27 NOTE — Sedation Documentation (Signed)
Pt back in correct NSR

## 2017-05-27 NOTE — Sedation Documentation (Signed)
ED Provider at bedside. 

## 2017-05-27 NOTE — Progress Notes (Signed)
RT at bedside for cardioversion. 

## 2017-05-27 NOTE — Telephone Encounter (Signed)
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  He verbalized understanding.

## 2017-05-27 NOTE — ED Triage Notes (Signed)
Pt endorses palpitations that began while playing tennis last evening. Pt has hx of a-fib with 2 ablations. Pt in a-fib in the 150s in triage. BP stable. Pt denies pain or shob.

## 2017-05-27 NOTE — Sedation Documentation (Signed)
Shock delivered at 200 j . 

## 2017-05-28 ENCOUNTER — Encounter: Payer: Self-pay | Admitting: Nurse Practitioner

## 2017-06-01 ENCOUNTER — Encounter (HOSPITAL_COMMUNITY): Payer: Self-pay | Admitting: Nurse Practitioner

## 2017-06-02 ENCOUNTER — Ambulatory Visit (HOSPITAL_COMMUNITY)
Admission: RE | Admit: 2017-06-02 | Discharge: 2017-06-02 | Disposition: A | Discharge status: Home / Self Care | Payer: Managed Care, Other (non HMO) | Source: Ambulatory Visit | Attending: Nurse Practitioner | Admitting: Nurse Practitioner

## 2017-06-02 ENCOUNTER — Encounter (HOSPITAL_COMMUNITY): Payer: Self-pay | Admitting: Nurse Practitioner

## 2017-06-02 VITALS — BP 112/66 | HR 81 | Ht 73.0 in | Wt 245.8 lb

## 2017-06-02 DIAGNOSIS — Z79899 Other long term (current) drug therapy: Secondary | ICD-10-CM | POA: Diagnosis not present

## 2017-06-02 DIAGNOSIS — N289 Disorder of kidney and ureter, unspecified: Secondary | ICD-10-CM | POA: Insufficient documentation

## 2017-06-02 DIAGNOSIS — Z8249 Family history of ischemic heart disease and other diseases of the circulatory system: Secondary | ICD-10-CM | POA: Diagnosis not present

## 2017-06-02 DIAGNOSIS — Z7984 Long term (current) use of oral hypoglycemic drugs: Secondary | ICD-10-CM | POA: Diagnosis not present

## 2017-06-02 DIAGNOSIS — E269 Hyperaldosteronism, unspecified: Secondary | ICD-10-CM | POA: Insufficient documentation

## 2017-06-02 DIAGNOSIS — R7303 Prediabetes: Secondary | ICD-10-CM | POA: Insufficient documentation

## 2017-06-02 DIAGNOSIS — Z888 Allergy status to other drugs, medicaments and biological substances status: Secondary | ICD-10-CM | POA: Diagnosis not present

## 2017-06-02 DIAGNOSIS — I480 Paroxysmal atrial fibrillation: Secondary | ICD-10-CM | POA: Diagnosis present

## 2017-06-02 DIAGNOSIS — M1990 Unspecified osteoarthritis, unspecified site: Secondary | ICD-10-CM | POA: Insufficient documentation

## 2017-06-02 DIAGNOSIS — I10 Essential (primary) hypertension: Secondary | ICD-10-CM | POA: Insufficient documentation

## 2017-06-02 DIAGNOSIS — Z7982 Long term (current) use of aspirin: Secondary | ICD-10-CM | POA: Diagnosis not present

## 2017-06-02 DIAGNOSIS — Z885 Allergy status to narcotic agent status: Secondary | ICD-10-CM | POA: Diagnosis not present

## 2017-06-02 DIAGNOSIS — Z8701 Personal history of pneumonia (recurrent): Secondary | ICD-10-CM | POA: Diagnosis not present

## 2017-06-02 DIAGNOSIS — Z87442 Personal history of urinary calculi: Secondary | ICD-10-CM | POA: Insufficient documentation

## 2017-06-02 DIAGNOSIS — K219 Gastro-esophageal reflux disease without esophagitis: Secondary | ICD-10-CM | POA: Insufficient documentation

## 2017-06-02 DIAGNOSIS — Z882 Allergy status to sulfonamides status: Secondary | ICD-10-CM | POA: Diagnosis not present

## 2017-06-02 NOTE — Progress Notes (Signed)
Primary Care Physician: Leeroy Cha, MD Referring Physician: Dr. Carlyon Casey [name] is a 61 y.o. male with a h/o paroxysmal afib with h/o ablation x 2, that presented to [facility] ER for onset of afib, and had successful cardioversion, K+ was found to be low at 3.2 and replaced. He is now on xarleto 20 mg a day for the 30 day post cardioversion protocol. He has a chadsvasc score of 1 for HTN, possibly 2 for "pre-diabetes."  Today, he denies symptoms of palpitations, chest pain, shortness of breath, orthopnea, PND, lower extremity edema, dizziness, presyncope, syncope, or neurologic sequela. The patient is tolerating medications without difficulties and is otherwise without complaint today.   Past Medical History:  Diagnosis Date  . 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HCC)    ongoing evaluation for elevated R>L adosterone secretion  . Arthritis   . Atrial enlargement, bilateral   . Atrial fibrillation ([place])   . Atrial fibrillation ([facility])    paroxysmal  . Atrial flutter ([place])   . Complication of anesthesia    could not urinate after UHR, needed cath, mental fog sinc cervical fusion 08-09-16   . Dysrhythmia    a-fib-had ablation x2  . GERD (gastroesophageal reflux disease)   . Hyperaldosteronism ([place])   . Hypertension   . Kidney cysts    "multiple"  . Nephrolithiasis   . Pneumonia 1990's  . Pre-diabetes   . Prostatitis   . Renal insufficiency    "Cr 1.6"   Past Surgical History:  Procedure Laterality Date  . ABLATION    . atrial fibrillation ablation with CTI ablation  05/27/11, 09/28/12   ablation for afib and atrial flutter by Dr Rayann Heman  . BACK SURGERY  08/09/2016   C 5 6  C 6 to C 7 cervical fusion  . double j stent placement  06/2004   left  . INGUINAL HERNIA REPAIR     "as a child; ? right"  . LUNG BIOPSY  1993  . NASAL SEPTOPLASTY W/ TURBINOPLASTY  07/2006  . prostate biopsy  2011  . schwannoma removal  ~01/2010   left thorax  . UMBILICAL HERNIA REPAIR  07/2006     Current Outpatient Medications  Medication Sig Dispense Refill  . allopurinol (ZYLOPRIM) 100 MG tablet Take 100 mg by mouth 2 (two) times daily.     Marland Kitchen aspirin EC 81 MG tablet Take 81 mg by mouth every evening.    Marland Kitchen atorvastatin (LIPITOR) 10 MG tablet Take 10 mg by mouth every evening.     Marland Kitchen CIALIS 20 MG tablet Take 20 mg by mouth daily as needed for erectile dysfunction.   11  . darifenacin (ENABLEX) 7.5 MG 24 hr tablet 1 tablet daily.  11  . diltiazem (CARDIZEM CD) 360 MG 24 hr capsule Take 360 mg by mouth every evening.  7  . eplerenone (INSPRA) 50 MG tablet Take 50 mg by mouth 2 (two) times daily.     . hydrochlorothiazide (MICROZIDE) 12.5 MG capsule Take 12.5 mg by mouth every evening.     Marland Kitchen ibuprofen (ADVIL,MOTRIN) 200 MG tablet Take 200 mg by mouth daily as needed for headache or moderate pain.     Marland Kitchen losartan (COZAAR) 100 MG tablet Take 100 mg by mouth every evening.    . metFORMIN (GLUCOPHAGE-XR) 500 MG 24 hr tablet Take 500 mg by mouth 2 (two) times daily.    Marland Kitchen omeprazole (PRILOSEC) 40 MG capsule Take 40 mg by mouth daily before supper.     Marland Kitchen  potassium chloride SA (K-DUR,KLOR-CON) 20 MEQ tablet Take 1 tablet (20 mEq total) by mouth 2 (two) times daily. 30 tablet 0  . rivaroxaban (XARELTO) 20 MG TABS tablet Take 1 tablet (20 mg total) by mouth daily with supper. 30 tablet 0  . sertraline (ZOLOFT) 50 MG tablet Take 1 tablet (50 mg total) by mouth every evening. 90 tablet 1  . HYDROcodone-acetaminophen (NORCO) 7.5-325 MG tablet Take 1 tablet by mouth once as needed (for pain score 1-4). (Patient not taking: Reported on 09/01/2016) 30 tablet 0   No current facility-administered medications for this encounter.     Allergies  Allergen Reactions  . Aldactone [Spironolactone]     Breast tissue enlargement  . Codeine Other (See Comments)    Headache   . Lisinopril Cough  . Nexium [Esomeprazole Magnesium] Other (See Comments)    Diarrhea   . Sulfa Antibiotics Rash  . Sulfa Drugs  Cross Reactors Rash    Social History   Socioeconomic History  . Marital status: Married    Spouse name: Not on file  . Number of children: Not on file  . Years of education: Not on file  . Highest education level: Not on file  Social Needs  . Financial resource strain: Not on file  . Food insecurity - worry: Not on file  . Food insecurity - inability: Not on file  . Transportation needs - medical: Not on file  . Transportation needs - non-medical: Not on file  Occupational History  . Not on file  Tobacco Use  . Smoking status: Never Smoker  . Smokeless tobacco: Never Used  Substance and Sexual Activity  . Alcohol use: Yes    Alcohol/week: 1.2 oz    Types: 2 Shots of liquor per week    Comment: occasional, 05-11-2016 Per pt 3 drinks per wk  . Drug use: No    Comment: 05-11-2016 per pt no  . Sexual activity: Yes  Other Topics Concern  . 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    Family History  Problem Relation Age of Onset  . Atrial fibrillation Unknown        sister has had 2 afib ablations  . Atrial fibrillation Sister     ROS- All systems are reviewed and negative except as per the HPI above  Physical Exam: Vitals:   06/02/17 1033  BP: 112/66  Pulse: 81  Weight: 245 lb 12.8 oz (111.5 kg)  Height: 6\' 1"  (1.854 m)   Wt Readings from Last 3 Encounters:  06/02/17 245 lb 12.8 oz (111.5 kg)  05/27/17 240 lb (108.9 kg)  10/25/16 240 lb (108.9 kg)    Labs: Lab Results  Component Value Date   NA 140 05/27/2017   K 3.2 (L) 05/27/2017   CL 112 (H) 05/27/2017   CO2 20 (L) 05/27/2017   GLUCOSE 100 (H) 05/27/2017   BUN 25 (H) 05/27/2017   CREATININE 1.67 (H) 05/27/2017   CALCIUM 9.1 05/27/2017   MG 1.8 08/11/2011   Lab Results  Component Value Date   INR 2.67 (H) 09/29/2012   No results found for: CHOL, HDL, LDLCALC, TRIG   GEN- The patient is well appearing, alert and oriented x 3  today.   Head- normocephalic, atraumatic Eyes-  Sclera clear, conjunctiva pink Ears- hearing intact Oropharynx- clear Neck- supple, no JVP Lymph- no cervical lymphadenopathy Lungs- Clear to ausculation bilaterally, normal  work of breathing Heart- Regular rate and rhythm, no murmurs, rubs or gallops, PMI not laterally displaced GI- soft, NT, ND, + BS Extremities- no clubbing, cyanosis, or edema MS- no significant deformity or atrophy Skin- no rash or lesion Psych- euthymic mood, full affect Neuro- strength and sensation are intact  EKG-SR at 81 bpm, pr int 212 ms, qrs int 108 ms, qtc 464 ms Epic records reviewed    Assessment and Plan: 1. Patoxysmal afib Successful cardioversion, 11/2 Possibly low K+/ or recent reduced sleep for a few days may have been the trigger as pt cannot think of anything else. He will need bmet rechecked and has 2 MD visits pending in the next week, will need bmet checked then as he feels of the two will need blood Discussed chadsvasc score with pt, he was not on anticoagulation prior to cardioversion, now on anticoagulation, xarelto 20 mg for at least 30 days post cardioversion He is not sure that "pre diabetes" would carry the same stroke risk of diabetes, if so, he has a chadsvasc score of 2 (HTN as well) and by guidelines should be on anticoagulation full time. He will further discuss with endocrinologist nest week.  F/u with Dr. Rayann Heman in the next 3-4 weeks as he has not seen him in the office in some time.  Geroge Baseman Scarlet Abad, [facility] [address] [place], [place] [address] [phone]

## 2017-06-23 ENCOUNTER — Other Ambulatory Visit (HOSPITAL_COMMUNITY): Payer: Self-pay | Admitting: *Deleted

## 2017-06-23 ENCOUNTER — Telehealth (HOSPITAL_COMMUNITY): Payer: Self-pay | Admitting: *Deleted

## 2017-06-23 MED ORDER — RIVAROXABAN 20 MG PO TABS: 20.0000 mg | ORAL_TABLET | Freq: Every day | ORAL | 6 refills | Status: DC

## 2017-06-23 NOTE — Telephone Encounter (Signed)
Pt called in stating his endocrinologist states he is considered prediabetic with hgbA1c of 6.2.  Chadvasc score of 2 will need to continue xarelto long term. RX sent to pharmacy of choice.

## 2017-07-11 ENCOUNTER — Ambulatory Visit: Payer: Self-pay | Admitting: Internal Medicine

## 2017-07-20 DIAGNOSIS — K219 Gastro-esophageal reflux disease without esophagitis: Secondary | ICD-10-CM | POA: Insufficient documentation

## 2017-07-20 DIAGNOSIS — I499 Cardiac arrhythmia, unspecified: Secondary | ICD-10-CM | POA: Insufficient documentation

## 2017-07-20 DIAGNOSIS — T8859XA Other complications of anesthesia, initial encounter: Secondary | ICD-10-CM | POA: Insufficient documentation

## 2017-07-20 DIAGNOSIS — R7303 Prediabetes: Secondary | ICD-10-CM | POA: Insufficient documentation

## 2017-07-20 DIAGNOSIS — N281 Cyst of kidney, acquired: Secondary | ICD-10-CM | POA: Insufficient documentation

## 2017-07-20 DIAGNOSIS — N200 Calculus of kidney: Secondary | ICD-10-CM | POA: Insufficient documentation

## 2017-07-20 DIAGNOSIS — M1990 Unspecified osteoarthritis, unspecified site: Secondary | ICD-10-CM | POA: Insufficient documentation

## 2017-07-20 DIAGNOSIS — I517 Cardiomegaly: Secondary | ICD-10-CM | POA: Insufficient documentation

## 2017-07-20 DIAGNOSIS — E270 Other adrenocortical overactivity: Secondary | ICD-10-CM | POA: Insufficient documentation

## 2017-07-20 DIAGNOSIS — N419 Inflammatory disease of prostate, unspecified: Secondary | ICD-10-CM | POA: Insufficient documentation

## 2017-07-20 DIAGNOSIS — J189 Pneumonia, unspecified organism: Secondary | ICD-10-CM | POA: Insufficient documentation

## 2017-07-20 DIAGNOSIS — T4145XA Adverse effect of unspecified anesthetic, initial encounter: Secondary | ICD-10-CM | POA: Insufficient documentation

## 2017-07-20 DIAGNOSIS — N289 Disorder of kidney and ureter, unspecified: Secondary | ICD-10-CM | POA: Insufficient documentation

## 2017-07-20 DIAGNOSIS — E269 Hyperaldosteronism, unspecified: Secondary | ICD-10-CM | POA: Insufficient documentation

## 2017-08-05 ENCOUNTER — Encounter: Payer: Self-pay | Admitting: Internal Medicine

## 2017-08-05 ENCOUNTER — Ambulatory Visit: Payer: Managed Care, Other (non HMO) | Admitting: Internal Medicine

## 2017-08-05 VITALS — BP 124/70 | HR 57 | Ht 73.0 in | Wt 242.4 lb

## 2017-08-05 DIAGNOSIS — I480 Paroxysmal atrial fibrillation: Secondary | ICD-10-CM

## 2017-08-05 DIAGNOSIS — I10 Essential (primary) hypertension: Secondary | ICD-10-CM

## 2017-08-05 NOTE — Patient Instructions (Signed)
Medication Instructions:  Your physician recommends that you continue on your current medications as directed. Please refer to the Current Medication list given to you today.   Labwork: Your physician recommends that you return for lab work in 6 weeks for a BMP   Testing/Procedures: None ordered   Follow-Up: Your physician wants you to follow-up in: 12 months with Dr Rayann Heman Dennis Bast will receive a reminder letter in the mail two months in advance. If you don't receive a letter, please call our office to schedule the follow-up appointment.

## 2017-08-05 NOTE — Progress Notes (Signed)
PCP: Leeroy Cha, MD Primary Cardiologist: Dr Marlou Porch Primary EP: Dr Carlyon Casey [name] is a 62 y.o. male who presents today for routine electrophysiology followup.  Since his afib episode in November, the patient reports doing very well.  Today, he denies symptoms of palpitations, chest pain, shortness of breath,  lower extremity edema, dizziness, presyncope, or syncope.  The patient is otherwise without complaint today.   Past Medical History:  Diagnosis Date  . 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HCC)    ongoing evaluation for elevated R>L adosterone secretion  . Arthritis   . Atrial enlargement, bilateral   . Atrial fibrillation ([place])   . Atrial fibrillation (HCC)    paroxysmal  . Atrial flutter ([place])   . Complication of anesthesia    could not urinate after UHR, needed cath, mental fog sinc cervical fusion 08-09-16   . Dysrhythmia    a-fib-had ablation x2  . GERD (gastroesophageal reflux disease)   . Hyperaldosteronism ([place])   . Hypertension   . Kidney cysts    "multiple"  . Nephrolithiasis   . Pneumonia 1990's  . Pre-diabetes   . Prostatitis   . Renal insufficiency    "Cr 1.6"   Past Surgical History:  Procedure Laterality Date  . ABLATION    . ATRIAL FIBRILLATION ABLATION N/A 09/28/2012   Procedure: ATRIAL FIBRILLATION ABLATION;  Surgeon: Thompson Grayer, MD;  Location: [facility] CATH LAB;  Service: Cardiovascular;  Laterality: N/A;  . atrial fibrillation ablation with CTI ablation  05/27/11, 09/28/12   ablation for afib and atrial flutter by Dr Rayann Heman  . BACK SURGERY  08/09/2016   C 5 6  C 6 to C 7 cervical fusion  . CARDIOVERSION  07/28/2011   Procedure: CARDIOVERSION;  Surgeon: Lelon Perla, MD;  Location: [facility];  Service: Cardiovascular;  Laterality: N/A;  . COLONOSCOPY WITH PROPOFOL N/A 03/03/2015   Procedure: COLONOSCOPY WITH PROPOFOL;  Surgeon: Garlan Fair, MD;  Location: [facility] ENDOSCOPY;  Service: Gastroenterology;  Laterality: N/A;  . double j stent  placement  06/2004   left  . ESOPHAGOGASTRODUODENOSCOPY (EGD) WITH PROPOFOL N/A 03/03/2015   Procedure: ESOPHAGOGASTRODUODENOSCOPY (EGD) WITH PROPOFOL;  Surgeon: Garlan Fair, MD;  Location: [facility] ENDOSCOPY;  Service: Gastroenterology;  Laterality: N/A;  . ESOPHAGOGASTRODUODENOSCOPY (EGD) WITH PROPOFOL N/A 10/25/2016   Procedure: ESOPHAGOGASTRODUODENOSCOPY (EGD) WITH PROPOFOL;  Surgeon: Garlan Fair, MD;  Location: [facility] ENDOSCOPY;  Service: Endoscopy;  Laterality: N/A;  . INGUINAL HERNIA REPAIR     "as a child; ? right"  . INGUINAL HERNIA REPAIR Left 06/23/2016   Procedure: OPEN LEFT INGUINAL HERNIA  REPAIR WITH MESH;  Surgeon: Johnathan Hausen, MD;  Location: [facility];  Service: General;  Laterality: Left;  . INSERTION OF MESH Left 06/23/2016   Procedure: INSERTION OF MESH;  Surgeon: Johnathan Hausen, MD;  Location: [facility];  Service: General;  Laterality: Left;  . LUNG BIOPSY  1993  . NASAL SEPTOPLASTY W/ TURBINOPLASTY  07/2006  . prostate biopsy  2011  . schwannoma removal  ~01/2010   left thorax  . TEE WITHOUT CARDIOVERSION N/A 09/27/2012   Procedure: TRANSESOPHAGEAL ECHOCARDIOGRAM (TEE);  Surgeon: Larey Dresser, MD;  Location: [facility];  Service: Cardiovascular;  Laterality: N/A;  . UMBILICAL HERNIA REPAIR  07/2006    ROS- all systems are reviewed and negatives except as per HPI above  Current Outpatient Medications  Medication Sig Dispense Refill  . allopurinol (ZYLOPRIM) 100 MG tablet Take 100 mg by mouth 2 (two)  times daily.     Marland Kitchen atorvastatin (LIPITOR) 10 MG tablet Take 10 mg by mouth every evening.     Marland Kitchen CIALIS 20 MG tablet Take 20 mg by mouth daily as needed for erectile dysfunction.   11  . darifenacin (ENABLEX) 7.5 MG 24 hr tablet 1 tablet daily.  11  . diltiazem (CARDIZEM CD) 360 MG 24 hr capsule Take 360 mg by mouth every evening.  7  . eplerenone (INSPRA) 50 MG tablet Take 50 mg by mouth 2 (two) times daily.     . hydrochlorothiazide  (MICROZIDE) 12.5 MG capsule Take 12.5 mg by mouth every evening.     Marland Kitchen ibuprofen (ADVIL,MOTRIN) 200 MG tablet Take 200 mg by mouth daily as needed for headache or moderate pain.     Marland Kitchen losartan (COZAAR) 100 MG tablet Take 100 mg by mouth every evening.    . metFORMIN (GLUCOPHAGE-XR) 500 MG 24 hr tablet Take 500 mg by mouth 2 (two) times daily.    Marland Kitchen omeprazole (PRILOSEC) 40 MG capsule Take 40 mg by mouth daily before supper.     . rivaroxaban (XARELTO) 20 MG TABS tablet Take 1 tablet (20 mg total) by mouth daily with supper. 30 tablet 6  . sertraline (ZOLOFT) 50 MG tablet Take 1 tablet (50 mg total) by mouth every evening. 90 tablet 1   No current facility-administered medications for this visit.     Physical Exam: Vitals:   08/05/17 0910 08/05/17 0935  BP: 124/74 124/70  Pulse: (!) 57   SpO2: 94%   Weight: 242 lb 6.4 oz (110 kg)   Height: 6\' 1"  (1.854 m)     GEN- The patient is well appearing, alert and oriented x 3 today.   Head- normocephalic, atraumatic Eyes-  Sclera clear, conjunctiva pink Ears- hearing intact Oropharynx- clear Lungs- Clear to ausculation bilaterally, normal work of breathing Heart- Regular rate and rhythm, no murmurs, rubs or gallops, PMI not laterally displaced GI- soft, NT, ND, + BS Extremities- no clubbing, cyanosis, or edema  EKG tracing ordered today is personally reviewed and shows sinus 57 bpm, PR 254 msec, LAD, poor R wave progression  Assessment and Plan:  1. afib Doing well currently off AAD therapy chads2vasc score is 1-2 (prediabetic)  We discussed pros and cons of anticoagulation at length today.  He will contemplate whether to stay on xarelto or not and let me know.  2. Hypokalemia Repeat bmet after repletion was adequate per patient. I have advised repeat bmet in 6 weeks.  3. HTN Well controlled with recently added hctz May need to adjust therapy if K is low on repeat bmet in 6 weeks  Return to see me in a year Follow-up with AF  clinic as needed in the interim.  Thompson Grayer MD, [facility] 08/05/2017 9:40 AM

## 2017-08-09 ENCOUNTER — Ambulatory Visit (HOSPITAL_COMMUNITY): Payer: 59 | Admitting: Psychiatry

## 2017-08-09 ENCOUNTER — Encounter (HOSPITAL_COMMUNITY): Payer: Self-pay | Admitting: Psychiatry

## 2017-08-09 DIAGNOSIS — F4321 Adjustment disorder with depressed mood: Secondary | ICD-10-CM | POA: Diagnosis not present

## 2017-08-09 DIAGNOSIS — Z79899 Other long term (current) drug therapy: Secondary | ICD-10-CM | POA: Diagnosis not present

## 2017-08-09 MED ORDER — SERTRALINE HCL 50 MG PO TABS: 50.0000 mg | ORAL_TABLET | Freq: Every evening | ORAL | 1 refills | Status: DC

## 2017-08-09 NOTE — Progress Notes (Signed)
[place] MD/PA/NP OP Progress Note  08/09/2017 57:84 AM Casey [name]  MRN:  [id number]  Chief Complaint: Doing well HPI: Spent time with patient hearing about some of his holiday stressors which he handled well.  Reports that he has been doing well at work, no acute changes in anxiety or mood.  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  No acute safety issues or substance use.  We will follow-up in 4-6 months.  Visit Diagnosis:    ICD-10-CM   1. Adjustment disorder with depressed mood F43.21     Past Psychiatric History: See intake H&P for full details. Reviewed, with no updates at this time.   Past Medical History:  Past Medical History:  Diagnosis Date  . 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HCC)    ongoing evaluation for elevated R>L adosterone secretion  . Arthritis   . Atrial enlargement, bilateral   . Atrial fibrillation ([place])   . Atrial fibrillation (HCC)    paroxysmal  . Atrial flutter ([place])   . Complication of anesthesia    could not urinate after UHR, needed cath, mental fog sinc cervical fusion 08-09-16   . Dysrhythmia    a-fib-had ablation x2  . GERD (gastroesophageal reflux disease)   . Hyperaldosteronism ([place])   . Hypertension   . Kidney cysts    "multiple"  . Nephrolithiasis   . Pneumonia 1990's  . Pre-diabetes   . Prostatitis   . Renal insufficiency    "Cr 1.6"    Past Surgical History:  Procedure Laterality Date  . ABLATION    . ATRIAL FIBRILLATION ABLATION N/A 09/28/2012   Procedure: ATRIAL FIBRILLATION ABLATION;  Surgeon: Thompson Grayer, MD;  Location: [facility] CATH LAB;  Service: Cardiovascular;  Laterality: N/A;  . atrial fibrillation ablation with CTI ablation  05/27/11, 09/28/12   ablation for afib and atrial flutter by Dr Rayann Heman  . BACK SURGERY  08/09/2016   C 5 6  C 6 to C 7 cervical fusion  . CARDIOVERSION  07/28/2011   Procedure: CARDIOVERSION;  Surgeon: Lelon Perla, MD;  Location: [place];  Service: Cardiovascular;   Laterality: N/A;  . COLONOSCOPY WITH PROPOFOL N/A 03/03/2015   Procedure: COLONOSCOPY WITH PROPOFOL;  Surgeon: Garlan Fair, MD;  Location: [facility] ENDOSCOPY;  Service: Gastroenterology;  Laterality: N/A;  . double j stent placement  06/2004   left  . ESOPHAGOGASTRODUODENOSCOPY (EGD) WITH PROPOFOL N/A 03/03/2015   Procedure: ESOPHAGOGASTRODUODENOSCOPY (EGD) WITH PROPOFOL;  Surgeon: Garlan Fair, MD;  Location: [facility]OSCOPY;  Service: Gastroenterology;  Laterality: N/A;  . ESOPHAGOGASTRODUODENOSCOPY (EGD) WITH PROPOFOL N/A 10/25/2016   Procedure: ESOPHAGOGASTRODUODENOSCOPY (EGD) WITH PROPOFOL;  Surgeon: Garlan Fair, MD;  Location: [facility]OS[facility];  Service: Endoscopy;  Laterality: N/A;  . INGUINAL HERNIA REPAIR     "as a child; ? right"  . INGUINAL HERNIA REPAIR Left 06/23/2016   Procedure: OPEN LEFT INGUINAL HERNIA  REPAIR WITH MESH;  Surgeon: Johnathan Hausen, MD;  Location: [place];  Service: General;  Laterality: Left;  . INSERTION OF MESH Left 06/23/2016   Procedure: INSERTION OF MESH;  Surgeon: Johnathan Hausen, MD;  Location: [place];  Service: General;  Laterality: Left;  . LUNG BIOPSY  1993  . NASAL SEPTOPLASTY W/ TURBINOPLASTY  07/2006  . prostate biopsy  2011  . schwannoma removal  ~01/2010   left thorax  . TEE WITHOUT CARDIOVERSION N/A 09/27/2012   Procedure: TRANSESOPHAGEAL ECHOCARDIOGRAM (TEE);  Surgeon: Larey Dresser, MD;  Location: [facility] ENDOS[facility];  Service: Cardiovascular;  Laterality: N/A;  . UMBILICAL HERNIA REPAIR  07/2006    Family Psychiatric History: See intake H&P for full details. Reviewed, with no updates at this time.   Family History:  Family History  Problem Relation Age of Onset  . Atrial fibrillation Unknown        sister has had 2 afib ablations  . Atrial fibrillation Sister     Social History:  Social History   Socioeconomic History  . Marital status: Married    Spouse name: None  . Number of children: None  . Years  of education: None  . Highest education level: None  Social Needs  . Financial resource strain: None  . Food insecurity - worry: None  . Food insecurity - inability: None  . Transportation needs - medical: None  . Transportation needs - non-medical: None  Occupational History  . None  Tobacco Use  . Smoking status: Never Smoker  . Smokeless tobacco: Never Used  Substance and Sexual Activity  . Alcohol use: Yes    Alcohol/week: 1.2 oz    Types: 2 Shots of liquor per week    Comment: occasional, 05-11-2016 Per pt 3 drinks per wk  . Drug use: No    Comment: 05-11-2016 per pt no  . Sexual activity: Yes  Other Topics Concern  . None  Social History Narrative   ** Merged History Encounter **       ENT physician in [place].    Allergies:  Allergies  Allergen Reactions  . Aldactone [Spironolactone]     Breast tissue enlargement  . Codeine Other (See Comments)    Headache  headache  . Lisinopril Cough and Other (See Comments)  . Nexium [Esomeprazole Magnesium] Other (See Comments)    Diarrhea   . Sulfa Antibiotics Rash  . Sulfa Drugs Cross Reactors Rash  . Sulfamethoxazole Rash    Metabolic Disorder Labs: No results found for: HGBA1C, MPG No results found for: PROLACTIN No results found for: CHOL, TRIG, HDL, CHOLHDL, VLDL, LDLCALC No results found for: TSH  Therapeutic Level Labs: No results found for: LITHIUM No results found for: VALPROATE No components found for:  CBMZ  Current Medications: Current Outpatient Medications  Medication Sig Dispense Refill  . allopurinol (ZYLOPRIM) 100 MG tablet Take 100 mg by mouth 2 (two) times daily.     Marland Kitchen atorvastatin (LIPITOR) 10 MG tablet Take 10 mg by mouth every evening.     Marland Kitchen CIALIS 20 MG tablet Take 20 mg by mouth daily as needed for erectile dysfunction.   11  . darifenacin (ENABLEX) 7.5 MG 24 hr tablet 1 tablet daily.  11  . diltiazem (CARDIZEM CD) 360 MG 24 hr capsule Take 360 mg by mouth every evening.  7  .  eplerenone (INSPRA) 50 MG tablet Take 50 mg by mouth 2 (two) times daily.     . hydrochlorothiazide (MICROZIDE) 12.5 MG capsule Take 12.5 mg by mouth every evening.     Marland Kitchen ibuprofen (ADVIL,MOTRIN) 200 MG tablet Take 200 mg by mouth daily as needed for headache or moderate pain.     Marland Kitchen losartan (COZAAR) 100 MG tablet Take 100 mg by mouth every evening.    . metFORMIN (GLUCOPHAGE-XR) 500 MG 24 hr tablet Take 500 mg by mouth 2 (two) times daily.    Marland Kitchen omeprazole (PRILOSEC) 40 MG capsule Take 40 mg by mouth daily before supper.     . rivaroxaban (XARELTO) 20 MG TABS tablet Take  1 tablet (20 mg total) by mouth daily with supper. 30 tablet 6  . sertraline (ZOLOFT) 50 MG tablet Take 1 tablet (50 mg total) by mouth every evening. 90 tablet 1   No current facility-administered medications for this visit.     Musculoskeletal: Strength & Muscle Tone: within normal limits Gait & Station: normal Patient leans: N/A  Psychiatric Specialty Exam: ROS  There were no vitals taken for this visit.There is no height or weight on file to calculate BMI.  General Appearance: Casual, Meticulous and Well Groomed  Eye Contact:  Good  Speech:  Clear and Coherent and Normal Rate  Volume:  Normal  Mood:  Euthymic  Affect:  Congruent  Thought Process:  Coherent, Goal Directed and Descriptions of Associations: Intact  Orientation:  Full (Time, Place, and Person)  Thought Content: Logical   Suicidal Thoughts:  No  Homicidal Thoughts:  No  Memory:  Immediate;   Good  Judgement:  Good  Insight:  Good  Psychomotor Activity:  Normal  Concentration:  Concentration: Good  Recall:  Good  Fund of Knowledge: Good  Language: Good  Akathisia:  Negative  Handed:  Right  AIMS (if indicated): not done  Assets:  Communication Skills Desire for Improvement Financial Resources/Insurance Housing Intimacy Leisure Time [place] Talents/Skills Transportation Vocational/Educational   ADL's:  Intact  Cognition: WNL  Sleep:  Good   Screenings:   Assessment and Plan: Casey [name] presents with stable mood and anxiety on Zoloft.  No acute changes and work stressors, had a fairly busy holiday with his family.  No acute safety issues, we will follow-up in 4-6 months or sooner if needed.  1. Adjustment disorder with depressed mood     Status of current problems: stable  Labs Ordered: No orders of the defined types were placed in this encounter.   Labs Reviewed: n/a  Collateral Obtained/Records Reviewed: n/a  Plan:  Zoloft 50 mg daily Return to clinic in 4-6 months  I spent 20 minutes with the patient in direct face-to-face clinical care.  Greater than 50% of this time was spent in counseling and coordination of care with the patient.    Aundra Dubin, MD 08/09/2017, 10:21 AM

## 2017-09-13 ENCOUNTER — Telehealth: Payer: Self-pay | Admitting: Internal Medicine

## 2017-09-13 NOTE — Telephone Encounter (Signed)
Pt having PSA drawn at Urologist on Friday.  They prefer to draw at their office so pt wanted to know if they are willing to draw our BMET if that would be ok?  Advised pt that will be fine as long as they send results to our office.  Pt will contact them to see if they will add BMET to his lab draw there.  Pt asked that I cancel lab appt here Friday.  Advised pt to call back to reschedule if they are unwilling to draw.  Pt in agreement with plan.

## 2017-09-13 NOTE — Telephone Encounter (Signed)
Patient calling, states that he is scheduled for lab work on 09-16-17 and also has to have lab work for urologist and would like to know if Dr. Rayann Heman would be able to use lab results from urologist.

## 2017-09-16 ENCOUNTER — Other Ambulatory Visit: Payer: Self-pay

## 2017-09-23 NOTE — Telephone Encounter (Signed)
Spoke with pt and he states that his Urology office said they would draw the BMET.  States he hasn't received his lab results either.  Pt states he will contact their office and see what is going on with labs.  Gave pt our fax number to provide to them.

## 2017-09-23 NOTE — Telephone Encounter (Signed)
Labs now available in Epic.  Will route to Dr. Rayann Heman to make him aware.

## 2017-09-26 NOTE — Telephone Encounter (Signed)
VM left for Pt.  Notified potassium level is WNL.  No changes at this time advised by Dr. Rayann Heman.  Notified to call urologist for results of PSA.  No further action needed at this time.

## 2017-10-28 ENCOUNTER — Telehealth: Payer: Self-pay

## 2017-10-28 NOTE — Telephone Encounter (Signed)
   [facility] Pre-operative Risk Assessment    Request for surgical clearance:  1. What type of surgery is being performed? Colonoscopy   2. When is this surgery scheduled? 11/04/2017   3. What type of clearance is required (medical clearance vs. Pharmacy clearance to hold med vs. Both)? Pharmacy  4. Are there any medications that need to be held prior to surgery and how long?Xarelto   5. Practice name and name of physician performing surgery? [facility] Dr Michail Sermon   6. What is your office phone and fax number? F: [phone] P: [phone]  7. Anesthesia type (None, local, MAC, general) ? MAC   Casey [name] 10/28/2017, 11:26 AM  _________________________________________________________________   (provider comments below)

## 2017-10-30 NOTE — Telephone Encounter (Signed)
Patient with diagnosis of atrial fibrillation on Xarelto for anticoagulation.    Procedure: colonoscopy Date of procedure: 11/04/17  CHADS2-VASc score of  2 ( HTN,, DM2, )  CrCl 90.5 Platelet count 245  Per office protocol, patient can hold Xarelto for 1 days prior to procedure.    Patient should restart Xarelto on the evening of procedure or day after, at discretion of procedure MD  .

## 2017-10-31 NOTE — Telephone Encounter (Signed)
Forwarded to requesting providers office via EPIC

## 2017-10-31 NOTE — Telephone Encounter (Signed)
   Primary Cardiologist: No primary care provider on file.  Chart reviewed as part of pre-operative protocol coverage. Patient was contacted 10/31/2017 in reference to pre-operative risk assessment for pending surgery as outlined below.  Ari Bernabei [facility] was last seen on 08/05/2017  by Dr. Rayann Heman.   Since that day, THORIN STARNER has done very well. He is playing tennis and remains active without recurrence of rapid HR or symptoms   Therefore, based on ACC/AHA guidelines, the patient would be at acceptable risk for the planned procedure without further cardiovascular testing.   I have reviewed pharmacy recommendations on Xarelto. He has stopped taking it as directed.,   I will route this recommendation to the requesting party via Epic fax function and remove from pre-op pool.  Please call with questions.  Jory Sims, NP 10/31/2017, 4:17 PM

## 2017-11-04 ENCOUNTER — Encounter: Payer: Self-pay | Admitting: Internal Medicine

## 2017-12-07 ENCOUNTER — Ambulatory Visit (INDEPENDENT_AMBULATORY_CARE_PROVIDER_SITE_OTHER): Payer: 59 | Admitting: Psychiatry

## 2017-12-07 ENCOUNTER — Encounter (HOSPITAL_COMMUNITY): Payer: Self-pay | Admitting: Psychiatry

## 2017-12-07 DIAGNOSIS — Z79899 Other long term (current) drug therapy: Secondary | ICD-10-CM | POA: Diagnosis not present

## 2017-12-07 DIAGNOSIS — F411 Generalized anxiety disorder: Secondary | ICD-10-CM

## 2017-12-07 MED ORDER — SERTRALINE HCL 50 MG PO TABS: 50.0000 mg | ORAL_TABLET | Freq: Every evening | ORAL | 1 refills | Status: DC

## 2017-12-07 NOTE — Progress Notes (Signed)
[facility] MD/PA/NP OP Progress Note  12/07/2017 4:16 AM Casey [name]  MRN:  [id number]  Chief Complaint: some stressors with family HPI: Casey [name] reports mood/anxiety have been stable despite some family stressors. zoloft has been effective in helping through this period. Denies any issues with anger or acting out, no side effects from zoloft.  Spent time processing some of this family dynamics between him and sisters.  Will rtc in 6 months or sooner if needed.  Disclosed to patient that this Probation officer is leaving this practice at the end of August 2019, and patients always has the right to choose their provider. Reassured patient that office will work to provide smooth transition of care whether they wish to remain at this office, or to continue with this provider, or seek alternative care options in community.  They expressed understanding.   Visit Diagnosis:    ICD-10-CM   1. GAD (generalized anxiety disorder) F41.1     Past Psychiatric History: See intake H&P for full details. Reviewed, with no updates at this time.   Past Medical History:  Past Medical History:  Diagnosis Date  . 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HCC)    ongoing evaluation for elevated R>L adosterone secretion  . Arthritis   . Atrial enlargement, bilateral   . Atrial fibrillation ([place])   . Atrial fibrillation (HCC)    paroxysmal  . Atrial flutter ([place])   . Complication of anesthesia    could not urinate after UHR, needed cath, mental fog sinc cervical fusion 08-09-16   . Dysrhythmia    a-fib-had ablation x2  . GERD (gastroesophageal reflux disease)   . Hyperaldosteronism ([place])   . Hypertension   . Kidney cysts    "multiple"  . Nephrolithiasis   . Pneumonia 1990's  . Pre-diabetes   . Prostatitis   . Renal insufficiency    "Cr 1.6"    Past Surgical History:  Procedure Laterality Date  . ABLATION    . ATRIAL FIBRILLATION ABLATION N/A 09/28/2012   Procedure: ATRIAL FIBRILLATION ABLATION;  Surgeon: Thompson Grayer, MD;  Location[facility] CATH LAB;  Service: Cardiovascular;  Laterality: N/A;  . atrial fibrillation ablation with CTI ablation  05/27/11, 09/28/12   ablation for afib and atrial flutter by Dr Rayann Heman  . BACK SURGERY  08/09/2016   C 5 6  C 6 to C 7 cervical fusion  . CARDIOVERSION  07/28/2011   Procedure: CARDIOVERSION;  Surgeon: Lelon Perla, MD;  Location: [place];  Service: Cardiovascular;  Laterality: N/A;  . COLONOSCOPY WITH PROPOFOL N/A 03/03/2015   Procedure: COLONOSCOPY WITH PROPOFOL;  Surgeon: Garlan Fair, MD;  [facility];  Service: Gastroenterology;  Laterality: N/A;  . double j stent placement  06/2004   left  . ESOPHAGOGASTRODUODENOSCOPY (EGD) WITH PROPOFOL N/A 03/03/2015   Procedure: ESOPHAGOGASTRODUODENOSCOPY (EGD) WITH PROPOFOL;  Surgeon: Garlan Fair, MD;  [facility];  Service: Gastroenterology;  Laterality: N/A;  . ESOPHAGOGASTRODUODENOSCOPY (EGD) WITH PROPOFOL N/A 10/25/2016   Procedure: ESOPHAGOGASTRODUODENOSCOPY (EGD) WITH PROPOFOL;  Surgeon: Garlan Fair, MD;  [facility];  Service: Endoscopy;  Laterality: N/A;  . INGUINAL HERNIA REPAIR     "as a child; ? right"  . INGUINAL HERNIA REPAIR Left 06/23/2016   Procedure: OPEN LEFT INGUINAL HERNIA  REPAIR WITH MESH;  Surgeon: Johnathan Hausen, MD;  Location: [place];  Service: General;  Laterality: Left;  . INSERTION OF MESH Left 06/23/2016   Procedure: INSERTION OF MESH;  Surgeon: Johnathan Hausen, MD;  Location: [place];  Service: General;  Laterality: Left;  . LUNG BIOPSY  1993  . NASAL SEPTOPLASTY W/ TURBINOPLASTY  07/2006  . prostate biopsy  2011  . schwannoma removal  ~01/2010   left thorax  . TEE WITHOUT CARDIOVERSION N/A 09/27/2012   Procedure: TRANSESOPHAGEAL ECHOCARDIOGRAM (TEE);  Surgeon: Larey Dresser, MD;  Location: [facility] ENDOSCOPY;  Service: Cardiovascular;  Laterality: N/A;  . UMBILICAL HERNIA REPAIR  07/2006    Family Psychiatric History:  See intake H&P for full details. Reviewed, with no updates at this time.   Family History:  Family History  Problem Relation Age of Onset  . Atrial fibrillation Unknown        sister has had 2 afib ablations  . Atrial fibrillation Sister     Social History:  Social History   Socioeconomic History  . Marital status: Married    Spouse name: Not on file  . Number of children: Not on file  . Years of education: Not on file  . Highest education level: Not on file  Occupational History  . Not on file  Social Needs  . Financial resource strain: Not on file  . Food insecurity:    Worry: Not on file    Inability: Not on file  . Transportation needs:    Medical: Not on file    Non-medical: Not on file  Tobacco Use  . Smoking status: Never Smoker  . Smokeless tobacco: Never Used  Substance and Sexual Activity  . Alcohol use: Yes    Alcohol/week: 1.2 oz    Types: 2 Shots of liquor per week    Comment: occasional, 05-11-2016 Per pt 3 drinks per wk  . Drug use: No    Comment: 05-11-2016 per pt no  . Sexual activity: Yes  Lifestyle  . Physical activity:    Days per week: Not on file    Minutes per session: Not on file  . Stress: Not on file  Relationships  . Social connections:    Talks on phone: Not on file    Gets together: Not on file    Attends religious service: Not on file    Active member of club or organization: Not on file    Attends meetings of clubs or organizations: Not on file    Relationship status: Not on file  Other Topics Concern  . Not on file  Social History Narrative   ** Merged History Encounter **       ENT physician in [place].    Allergies:  Allergies  Allergen Reactions  . Aldactone [Spironolactone]     Breast tissue enlargement  . Codeine Other (See Comments)    Headache  headache  . Lisinopril Cough and Other (See Comments)  . Nexium [Esomeprazole Magnesium] Other (See Comments)    Diarrhea   . Sulfa Antibiotics Rash  . Sulfa  Drugs Cross Reactors Rash  . Sulfamethoxazole Rash    Metabolic Disorder Labs: No results found for: HGBA1C, MPG No results found for: PROLACTIN No results found for: CHOL, TRIG, HDL, CHOLHDL, VLDL, LDLCALC No results found for: TSH  Therapeutic Level Labs: No results found for: LITHIUM No results found for: VALPROATE No components found for:  CBMZ  Current Medications: Current Outpatient Medications  Medication Sig Dispense Refill  . allopurinol (ZYLOPRIM) 100 MG tablet Take 100 mg by mouth 2 (two) times daily.     Marland Kitchen atorvastatin (LIPITOR) 10 MG tablet Take 10 mg by mouth every evening.     Marland Kitchen  CIALIS 20 MG tablet Take 20 mg by mouth daily as needed for erectile dysfunction.   11  . darifenacin (ENABLEX) 7.5 MG 24 hr tablet 1 tablet daily.  11  . diltiazem (CARDIZEM CD) 360 MG 24 hr capsule Take 360 mg by mouth every evening.  7  . eplerenone (INSPRA) 50 MG tablet Take 50 mg by mouth 2 (two) times daily.     . hydrochlorothiazide (MICROZIDE) 12.5 MG capsule Take 12.5 mg by mouth every evening.     Marland Kitchen ibuprofen (ADVIL,MOTRIN) 200 MG tablet Take 200 mg by mouth daily as needed for headache or moderate pain.     Marland Kitchen losartan (COZAAR) 100 MG tablet Take 100 mg by mouth every evening.    . metFORMIN (GLUCOPHAGE-XR) 500 MG 24 hr tablet Take 500 mg by mouth 2 (two) times daily.    Marland Kitchen omeprazole (PRILOSEC) 40 MG capsule Take 40 mg by mouth daily before supper.     . rivaroxaban (XARELTO) 20 MG TABS tablet Take 1 tablet (20 mg total) by mouth daily with supper. 30 tablet 6  . sertraline (ZOLOFT) 50 MG tablet Take 1 tablet (50 mg total) by mouth every evening. 90 tablet 1   No current facility-administered medications for this visit.      Musculoskeletal: Strength & Muscle Tone: within normal limits Gait & Station: normal Patient leans: N/A  Psychiatric Specialty Exam: ROS  There were no vitals taken for this visit.There is no height or weight on file to calculate BMI.  General  Appearance: Casual and Well Groomed  Eye Contact:  Good  Speech:  Clear and Coherent and Normal Rate  Volume:  Normal  Mood:  Euthymic  Affect:  Appropriate and Congruent  Thought Process:  Goal Directed and Descriptions of Associations: Intact  Orientation:  Full (Time, Place, and Person)  Thought Content: Logical   Suicidal Thoughts:  No  Homicidal Thoughts:  No  Memory:  Immediate;   Good  Judgement:  Good  Insight:  Good  Psychomotor Activity:  Normal  Concentration:  Concentration: Good  Recall:  Good  Fund of Knowledge: Good  Language: Good  Akathisia:  Negative  Handed:  Right  AIMS (if indicated): not done  Assets:  Communication Skills Desire for Improvement Financial Resources/Insurance Housing Intimacy Leisure Time [place] Talents/Skills Transportation Vocational/Educational  ADL's:  Intact  Cognition: WNL  Sleep:  Good   Screenings:   Assessment and Plan: Casey [name] presents with stable mood/anxiety on zoloft. No issues of acting out at work, feels his temper management has been well managed.  Will rtc in 6 months for follow-up. No side effects from zoloft 50 mg daily.  1. GAD (generalized anxiety disorder)     Status of current problems: stable  Labs Ordered: No orders of the defined types were placed in this encounter.   Labs Reviewed: na  Collateral Obtained/Records Reviewed: na  Plan:  zoloft 50 mg daily  I spent 15 minutes with the patient in direct face-to-face clinical care.  Greater than 50% of this time was spent in counseling and coordination of care with the patient.    Aundra Dubin, MD 12/07/2017, 9:11 AM

## 2018-01-09 ENCOUNTER — Other Ambulatory Visit: Payer: Self-pay | Admitting: Geriatric Medicine

## 2018-01-09 ENCOUNTER — Ambulatory Visit
Admission: RE | Admit: 2018-01-09 | Discharge: 2018-01-09 | Disposition: A | Discharge status: Home / Self Care | Payer: Managed Care, Other (non HMO) | Source: Ambulatory Visit | Attending: Geriatric Medicine | Admitting: Geriatric Medicine

## 2018-01-09 DIAGNOSIS — J40 Bronchitis, not specified as acute or chronic: Secondary | ICD-10-CM

## 2018-02-07 ENCOUNTER — Other Ambulatory Visit (HOSPITAL_COMMUNITY): Payer: Self-pay | Admitting: Nurse Practitioner

## 2018-02-07 NOTE — Telephone Encounter (Signed)
Pt last saw Dr Rayann Heman 08/05/17, last labs 10/20/17 Creat 1.48, age 62, weight 110kg, CrCl 79.49, based on CrCl pt is on appropriate dosage of Xarelto 20mg  QD.  Will refill rx.

## 2018-02-13 DIAGNOSIS — R110 Nausea: Secondary | ICD-10-CM | POA: Insufficient documentation

## 2018-03-11 DIAGNOSIS — N3941 Urge incontinence: Secondary | ICD-10-CM | POA: Insufficient documentation

## 2018-04-04 ENCOUNTER — Other Ambulatory Visit: Payer: Self-pay | Admitting: Urology

## 2018-04-04 DIAGNOSIS — R972 Elevated prostate specific antigen [PSA]: Secondary | ICD-10-CM

## 2018-04-18 ENCOUNTER — Other Ambulatory Visit: Payer: Self-pay

## 2018-04-18 DIAGNOSIS — R159 Full incontinence of feces: Secondary | ICD-10-CM | POA: Insufficient documentation

## 2018-04-19 ENCOUNTER — Ambulatory Visit
Admission: RE | Admit: 2018-04-19 | Discharge: 2018-04-19 | Disposition: A | Discharge status: Home / Self Care | Payer: Managed Care, Other (non HMO) | Source: Ambulatory Visit | Attending: Urology | Admitting: Urology

## 2018-04-19 DIAGNOSIS — R972 Elevated prostate specific antigen [PSA]: Secondary | ICD-10-CM

## 2018-04-19 MED ORDER — GADOBENATE DIMEGLUMINE 529 MG/ML IV SOLN
20.0000 mL | Freq: Once | INTRAVENOUS | Status: AC | PRN
  Administered 2018-04-19: 20 mL via INTRAVENOUS

## 2018-05-29 ENCOUNTER — Other Ambulatory Visit: Payer: Self-pay | Admitting: Internal Medicine

## 2018-05-29 ENCOUNTER — Other Ambulatory Visit: Payer: Self-pay | Admitting: Otolaryngology

## 2018-05-29 DIAGNOSIS — M25469 Effusion, unspecified knee: Secondary | ICD-10-CM

## 2018-05-30 ENCOUNTER — Ambulatory Visit
Admission: RE | Admit: 2018-05-30 | Discharge: 2018-05-30 | Disposition: A | Discharge status: Home / Self Care | Payer: Managed Care, Other (non HMO) | Source: Ambulatory Visit | Attending: Internal Medicine | Admitting: Internal Medicine

## 2018-05-30 DIAGNOSIS — M25469 Effusion, unspecified knee: Secondary | ICD-10-CM

## 2018-05-31 ENCOUNTER — Encounter (HOSPITAL_COMMUNITY): Payer: Self-pay | Admitting: Emergency Medicine

## 2018-05-31 ENCOUNTER — Emergency Department (HOSPITAL_COMMUNITY): Payer: Managed Care, Other (non HMO)

## 2018-05-31 ENCOUNTER — Other Ambulatory Visit: Payer: Self-pay

## 2018-05-31 ENCOUNTER — Emergency Department (HOSPITAL_COMMUNITY)
Admission: EM | Admit: 2018-05-31 | Discharge: 2018-05-31 | Disposition: A | Discharge status: Home / Self Care | Payer: Managed Care, Other (non HMO) | Attending: Emergency Medicine | Admitting: Emergency Medicine

## 2018-05-31 DIAGNOSIS — Z79899 Other long term (current) drug therapy: Secondary | ICD-10-CM | POA: Insufficient documentation

## 2018-05-31 DIAGNOSIS — N179 Acute kidney failure, unspecified: Secondary | ICD-10-CM | POA: Insufficient documentation

## 2018-05-31 DIAGNOSIS — I10 Essential (primary) hypertension: Secondary | ICD-10-CM | POA: Diagnosis not present

## 2018-05-31 DIAGNOSIS — I8001 Phlebitis and thrombophlebitis of superficial vessels of right lower extremity: Secondary | ICD-10-CM | POA: Diagnosis not present

## 2018-05-31 DIAGNOSIS — R0609 Other forms of dyspnea: Secondary | ICD-10-CM

## 2018-05-31 DIAGNOSIS — R0602 Shortness of breath: Secondary | ICD-10-CM | POA: Diagnosis present

## 2018-05-31 LAB — CBC
HCT: 43 % (ref 39.0–52.0)
Hemoglobin: 13.2 g/dL (ref 13.0–17.0)
MCH: 27.4 pg (ref 26.0–34.0)
MCHC: 30.7 g/dL (ref 30.0–36.0)
MCV: 89.4 fL (ref 80.0–100.0)
NRBC: 0 % (ref 0.0–0.2)
PLATELETS: 268 10*3/uL (ref 150–400)
RBC: 4.81 MIL/uL (ref 4.22–5.81)
RDW: 15.1 % (ref 11.5–15.5)
WBC: 12.5 10*3/uL — AB (ref 4.0–10.5)

## 2018-05-31 LAB — BASIC METABOLIC PANEL
ANION GAP: 12 (ref 5–15)
BUN: 34 mg/dL — ABNORMAL HIGH (ref 8–23)
CALCIUM: 9 mg/dL (ref 8.9–10.3)
CO2: 24 mmol/L (ref 22–32)
Chloride: 100 mmol/L (ref 98–111)
Creatinine, Ser: 2.28 mg/dL — ABNORMAL HIGH (ref 0.61–1.24)
GFR calc non Af Amer: 29 mL/min — ABNORMAL LOW (ref >60)
GFR, EST AFRICAN AMERICAN: 33 mL/min — AB (ref >60)
Glucose, Bld: 125 mg/dL — ABNORMAL HIGH (ref 70–99)
Potassium: 2.9 mmol/L — ABNORMAL LOW (ref 3.5–5.1)
Sodium: 136 mmol/L (ref 135–145)

## 2018-05-31 LAB — I-STAT TROPONIN, ED: Troponin i, poc: 0 ng/mL (ref 0.00–0.08)

## 2018-05-31 MED ORDER — TECHNETIUM TC 99M DIETHYLENETRIAME-PENTAACETIC ACID
30.0000 | Freq: Once | INTRAVENOUS | Status: AC | PRN
  Administered 2018-05-31: 30 via RESPIRATORY_TRACT

## 2018-05-31 MED ORDER — NALOXONE HCL 2 MG/2ML IJ SOSY
PREFILLED_SYRINGE | INTRAMUSCULAR | Status: AC
  Filled 2018-05-31: qty 2

## 2018-05-31 MED ORDER — TECHNETIUM TO 99M ALBUMIN AGGREGATED
4.0000 | Freq: Once | INTRAVENOUS | Status: AC | PRN
  Administered 2018-05-31: 4 via INTRAVENOUS

## 2018-05-31 MED ORDER — SODIUM CHLORIDE 0.9 % IV BOLUS
1000.0000 mL | Freq: Once | INTRAVENOUS | Status: AC
  Administered 2018-05-31: 1000 mL via INTRAVENOUS

## 2018-05-31 NOTE — ED Notes (Addendum)
lockdown lifted, informed provider.  Provider bedside

## 2018-05-31 NOTE — ED Notes (Signed)
Spoke to Dr. Gilford Raid regarding study.  It is to be done tonight!  Dietitian

## 2018-05-31 NOTE — ED Triage Notes (Signed)
To ED via private vehicle - c/o shortness of breath, has been recently diagnosed with superficial right saphenous vein thrombosis after long flight to [place]. Has been on xeralto -- restarted yesterday

## 2018-05-31 NOTE — ED Notes (Signed)
Pt upset that his wife can not come back due to security lockdown.  Explained situation to pt.

## 2018-05-31 NOTE — ED Notes (Signed)
Transported to VQ 

## 2018-05-31 NOTE — ED Notes (Signed)
Apple juice given per provider

## 2018-05-31 NOTE — ED Notes (Signed)
Staff is coming in for VQ study

## 2018-05-31 NOTE — ED Provider Notes (Signed)
[facility] EMERGENCY DEPARTMENT Provider Note   CSN: [id number] Arrival date & time: 05/31/18  1603     History   Chief Complaint Chief Complaint  Patient presents with  . Shortness of Breath    HPI Casey [name] is a 62 y.o. male.  Pt presents to the ED today with sob.  The pt has a hx of afib and was on Xarelto, but stopped it after the last cardioversion per his doctor's blessing.  The pt said he went on a recent trip to [place] in October for 8 days.  While there, he noticed that his right leg was swollen.  The pt talked to his doctor when he came home and a RLE [place] was ordered.  This was done yesterday which did not reveal a DVT, but did show a superficial thrombophlebitis.  The pt has also noticed that he has been much more sob than normal.  He did start the Xarelto back again yesterday.  He has not felt himself go into afib.     Past Medical History:  Diagnosis Date  . 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HCC)    ongoing evaluation for elevated R>L adosterone secretion  . Arthritis   . Atrial enlargement, bilateral   . Atrial fibrillation ([place])   . Atrial fibrillation (HCC)    paroxysmal  . Atrial flutter ([place])   . Complication of anesthesia    could not urinate after UHR, needed cath, mental fog sinc cervical fusion 08-09-16   . Dysrhythmia    a-fib-had ablation x2  . GERD (gastroesophageal reflux disease)   . Hyperaldosteronism ([place])   . Hypertension   . Kidney cysts    "multiple"  . Nephrolithiasis   . Pneumonia 1990's  . Pre-diabetes   . Prostatitis   . Renal insufficiency    "Cr 1.6"    Patient Active Problem List   Diagnosis Date Noted  . Renal insufficiency   . Prostatitis   . Pre-diabetes   . Pneumonia   . Nephrolithiasis   . Kidney cysts   . Hyperaldosteronism ([place])   . GERD (gastroesophageal reflux disease)   . Dysrhythmia   . Complication of anesthesia   . Atrial enlargement, bilateral   . Arthritis   . Adrenal cortical  hyperfunction ([place])   . Inguinal hernia of left side without obstruction or gangrene Nov 2017 06/23/2016  . Adjustment disorder with depressed mood 05/11/2016  . Atrial fibrillation ([place]) 03/18/2011  . Atrial flutter ([place]) 03/18/2011  . Hypertension 03/18/2011    Past Surgical History:  Procedure Laterality Date  . ABLATION    . ATRIAL FIBRILLATION ABLATION N/A 09/28/2012   Procedure: ATRIAL FIBRILLATION ABLATION;  Surgeon: Thompson Grayer, MD;  Location: [facility] CATH LAB;  Service: Cardiovascular;  Laterality: N/A;  . atrial fibrillation ablation with CTI ablation  05/27/11, 09/28/12   ablation for afib and atrial flutter by Dr Rayann Heman  . BACK SURGERY  08/09/2016   C 5 6  C 6 to C 7 cervical fusion  . CARDIOVERSION  07/28/2011   Procedure: CARDIOVERSION;  Surgeon: Lelon Perla, MD;  Location: [facility];  Service: Cardiovascular;  Laterality: N/A;  . COLONOSCOPY WITH PROPOFOL N/A 03/03/2015   Procedure: COLONOSCOPY WITH PROPOFOL;  Surgeon: Garlan Fair, MD;  Location: [facility] ENDOSCOPY;  Service: Gastroenterology;  Laterality: N/A;  . double j stent placement  06/2004   left  . ESOPHAGOGASTRODUODENOSCOPY (EGD) WITH PROPOFOL N/A 03/03/2015   Procedure: ESOPHAGOGASTRODUODENOSCOPY (EGD) WITH PROPOFOL;  Surgeon: Ursula Alert  Wynetta Emery, MD;  Location: [facility];  Service: Gastroenterology;  Laterality: N/A;  . ESOPHAGOGASTRODUODENOSCOPY (EGD) WITH PROPOFOL N/A 10/25/2016   Procedure: ESOPHAGOGASTRODUODENOSCOPY (EGD) WITH PROPOFOL;  Surgeon: Garlan Fair, MD;  Location: [facility];  Service: Endoscopy;  Laterality: N/A;  . INGUINAL HERNIA REPAIR     "as a child; ? right"  . INGUINAL HERNIA REPAIR Left 06/23/2016   Procedure: OPEN LEFT INGUINAL HERNIA  REPAIR WITH MESH;  Surgeon: Johnathan Hausen, MD;  Location: [facility];  Service: General;  Laterality: Left;  . INSERTION OF MESH Left 06/23/2016   Procedure: INSERTION OF MESH;  Surgeon: Johnathan Hausen, MD;  Location: [facility];   Service: General;  Laterality: Left;  . LUNG BIOPSY  1993  . NASAL SEPTOPLASTY W/ TURBINOPLASTY  07/2006  . prostate biopsy  2011  . schwannoma removal  ~01/2010   left thorax  . TEE WITHOUT CARDIOVERSION N/A 09/27/2012   Procedure: TRANSESOPHAGEAL ECHOCARDIOGRAM (TEE);  Surgeon: Larey Dresser, MD;  Location: [facility];  Service: Cardiovascular;  Laterality: N/A;  . UMBILICAL HERNIA REPAIR  07/2006        Home Medications    Prior to Admission medications   Medication Sig Start Date End Date Taking? Authorizing Provider  allopurinol (ZYLOPRIM) 100 MG tablet Take 100 mg by mouth 2 (two) times daily.    Yes [provider]  atorvastatin (LIPITOR) 10 MG tablet Take 10 mg by mouth every evening.    Yes [provider]  CIALIS 20 MG tablet Take 20 mg by mouth daily as needed for erectile dysfunction.  09/23/16  Yes [provider]  diltiazem (CARDIZEM CD) 360 MG 24 hr capsule Take 360 mg by mouth every evening. 10/04/16  Yes [provider]  eplerenone (INSPRA) 50 MG tablet Take 50 mg by mouth 2 (two) times daily.   Yes [provider]  FIBER ADULT GUMMIES PO Take 1 tablet by mouth daily.   Yes [provider]  hydrochlorothiazide (MICROZIDE) 12.5 MG capsule Take 12.5 mg by mouth every evening.    Yes [provider]  ibuprofen (ADVIL,MOTRIN) 200 MG tablet Take 200 mg by mouth daily as needed for headache or moderate pain.    Yes [provider]  losartan (COZAAR) 100 MG tablet Take 100 mg by mouth every evening.   Yes [provider]  metFORMIN (GLUCOPHAGE-XR) 500 MG 24 hr tablet Take 500 mg by mouth 2 (two) times daily.   Yes [provider]  omeprazole (PRILOSEC) 40 MG capsule Take 40 mg by mouth daily before supper.    Yes [provider]  oxybutynin (DITROPAN-XL) 10 MG 24 hr tablet Take 10 mg by mouth daily. 01/28/18  Yes [provider]  sertraline (ZOLOFT) 50 MG tablet Take 1 tablet  (50 mg total) by mouth every evening. 12/07/17  Yes Eksir, Richard Miu, MD  XARELTO 20 MG TABS tablet TAKE 1 TABLET(20 MG) BY MOUTH DAILY WITH SUPPER Patient taking differently: Take 20 mg by mouth every evening.  02/07/18  Yes AllredJeneen Rinks, MD    Family History Family History  Problem Relation Age of Onset  . Atrial fibrillation Unknown        sister has had 2 afib ablations  . Atrial fibrillation Sister     Social History Social History   Tobacco Use  . Smoking status: Never Smoker  . Smokeless tobacco: Never Used  Substance Use Topics  . Alcohol use: Yes    Alcohol/week: 2.0  standard drinks    Types: 2 Shots of liquor per week    Comment: occasional, 05-11-2016 Per pt 3 drinks per wk  . Drug use: No    Comment: 05-11-2016 per pt no     Allergies   Aldactone [spironolactone]; Codeine; Lisinopril; Nexium [esomeprazole magnesium]; Sulfa antibiotics; Sulfa drugs cross reactors; and Sulfamethoxazole   Review of Systems Review of Systems  Respiratory: Positive for shortness of breath.   Cardiovascular: Positive for chest pain.  All other systems reviewed and are negative.    Physical Exam Updated Vital Signs BP (!) 146/90   Pulse 64   Resp (!) 9   Ht 6\' 1"  (1.854 m)   Wt 108.9 kg   SpO2 95%   BMI 31.66 kg/m   Physical Exam  Constitutional: He is oriented to person, place, and time. He appears well-developed and well-nourished.  HENT:  Head: Normocephalic and atraumatic.  Mouth/Throat: Oropharynx is clear and moist.  Eyes: Pupils are equal, round, and reactive to light. EOM are normal.  Neck: Normal range of motion. Neck supple.  Cardiovascular: Normal rate and regular rhythm.  Pulmonary/Chest: Effort normal and breath sounds normal.  Abdominal: Soft. Bowel sounds are normal.  Musculoskeletal: Normal range of motion.       Right lower leg: He exhibits edema.       Left lower leg: Normal.  Neurological: He is alert and oriented to person, place, and time.   Skin: Skin is warm and dry. Capillary refill takes less than 2 seconds.  Psychiatric: He has a normal mood and affect. His behavior is normal.  Nursing note and vitals reviewed.    ED Treatments / Results  Labs (all labs ordered are listed, but only abnormal results are displayed) Labs Reviewed  BASIC METABOLIC PANEL - Abnormal; Notable for the following components:      Result Value   Potassium 2.9 (*)    Glucose, Bld 125 (*)    BUN 34 (*)    Creatinine, Ser 2.28 (*)    GFR calc non Af Amer 29 (*)    GFR calc Af Amer 33 (*)    All other components within normal limits  CBC - Abnormal; Notable for the following components:   WBC 12.5 (*)    All other components within normal limits  I-STAT TROPONIN, ED    EKG EKG Interpretation  Date/Time:  Wednesday May 31 2018 16:11:06 EST Ventricular Rate:  79 PR Interval:    QRS Duration: 114 QT Interval:  480 QTC Calculation: 551 R Axis:   -69 Text Interpretation:  Sinus rhythm Prolonged PR interval Borderline IVCD with LAD Inferolateral infarct, old Prolonged QT interval No significant change since last tracing Confirmed by Isla Pence [phone]) on 05/31/2018 4:16:52 PM   Radiology Dg Chest 2 View  Result Date: 05/31/2018 CLINICAL DATA:  SOB, chest "heaviness" x2-3 days, calf pain for "several weeks". Hx of afib, bilateral atrial enlargement, atrial flutter, hypertension, pneumonia(1990s), lung biopsy(1990s), pre-diabetes, neuroma(schwannoma) removal from chest w.*comment was truncated* EXAM: CHEST - 2 VIEW COMPARISON:  01/09/2018 FINDINGS: Lower cervical spine fixation. Remote left-sided rib fractures. Midline trachea. Normal heart size. Atherosclerosis in the transverse aorta. Mild left hemidiaphragm elevation. No pleural effusion or pneumothorax. Low lung volumes. Left base scarring or subsegmental atelectasis. IMPRESSION: No acute cardiopulmonary disease. Aortic Atherosclerosis (ICD10-I70.0). Electronically Signed   By: Abigail Miyamoto M.D.   On: 05/31/2018 18:31   Nm Pulmonary Vent And Perf (v/q Scan)  Result Date: 05/31/2018 CLINICAL DATA:  Shortness of breath. Superficial thrombophlebitis. No deep venous thrombosis is reported. EXAM: NUCLEAR MEDICINE VENTILATION - PERFUSION LUNG SCAN TECHNIQUE: Ventilation images were obtained in multiple projections using inhaled aerosol Tc-70m DTPA. Perfusion images were obtained in multiple projections after intravenous injection of Tc-3m-MAA. RADIOPHARMACEUTICALS:  27.0 mCi of Tc-6m DTPA aerosol inhalation and 4.0 mCi Tc65m-MAA IV COMPARISON:  Chest radiograph earlier today FINDINGS: Ventilation: No focal ventilation defect. Perfusion: No wedge shaped peripheral perfusion defects to suggest acute pulmonary embolism. IMPRESSION: Normal appearing examination. No focal ventilation defect or perfusion defect. Low probability pulmonary embolus. Electronically Signed   By: Staci Righter M.D.   On: 05/31/2018 21:44   US Venous Img Lower Bilateral  Result Date: 05/30/2018 CLINICAL DATA:  Recent long flight, now with right calf pain and edema. Evaluate for DVT. EXAM: BILATERAL LOWER EXTREMITY VENOUS DOPPLER ULTRASOUND TECHNIQUE: Gray-scale sonography with graded compression, as well as color Doppler and duplex ultrasound were performed to evaluate the lower extremity deep venous systems from the level of the common femoral vein and including the common femoral, femoral, profunda femoral, popliteal and calf veins including the posterior tibial, peroneal and gastrocnemius veins when visible. The superficial great saphenous vein was also interrogated. Spectral Doppler was utilized to evaluate flow at rest and with distal augmentation maneuvers in the common femoral, femoral and popliteal veins. COMPARISON:  None. FINDINGS: RIGHT LOWER EXTREMITY Common Femoral Vein: No evidence of thrombus. Normal compressibility, respiratory phasicity and response to augmentation. Saphenofemoral Junction: No evidence  of thrombus. Normal compressibility and flow on color Doppler imaging. Profunda Femoral Vein: No evidence of thrombus. Normal compressibility and flow on color Doppler imaging. Femoral Vein: No evidence of thrombus. Normal compressibility, respiratory phasicity and response to augmentation. Popliteal Vein: No evidence of thrombus. Normal compressibility, respiratory phasicity and response to augmentation. Calf Veins: No evidence of thrombus. Normal compressibility and flow on color Doppler imaging. Superficial Great Saphenous Vein: No evidence of thrombus. Normal compressibility. Venous Reflux:  None. Other Findings: Examination is positive for occlusive thrombus within the interrogated course of the right lesser saphenous vein (images 69 through 75). There is no extension of this occlusive SVT to the right popliteal vein (images 55 and 56). LEFT LOWER EXTREMITY Common Femoral Vein: No evidence of thrombus. Normal compressibility, respiratory phasicity and response to augmentation. Saphenofemoral Junction: No evidence of thrombus. Normal compressibility and flow on color Doppler imaging. Profunda Femoral Vein: No evidence of thrombus. Normal compressibility and flow on color Doppler imaging. Femoral Vein: No evidence of thrombus. Normal compressibility, respiratory phasicity and response to augmentation. Popliteal Vein: No evidence of thrombus. Normal compressibility, respiratory phasicity and response to augmentation. Calf Veins: No evidence of thrombus. Normal compressibility and flow on color Doppler imaging. Superficial Great Saphenous Vein: No evidence of thrombus. Normal compressibility. Venous Reflux:  None. Other Findings:  None. IMPRESSION: 1. No evidence of DVT within either lower extremity. 2. Examination is positive for occlusive superficial thrombophlebitis involving the interrogated course of the right lesser saphenous vein. Again, there is no extension of this occlusive SVT to the deep venous system  of the right lower extremity. Above findings were reviewed directly with the patient, Dr. [name]. Electronically Signed   By: Sandi Mariscal M.D.   On: 05/30/2018 15:02    Procedures Procedures (including critical care time)  Medications Ordered in ED Medications  naloxone [facility]) 2 MG/2ML injection (  Not Given 05/31/18 1930)  sodium chloride 0.9 % bolus 1,000 mL (0 mLs Intravenous Stopped 05/31/18 2016)  technetium TC 84M  diethylenetriame-pentaacetic acid (DTPA) injection 30 millicurie (30 millicuries Inhalation Given 05/31/18 2030)  technetium albumin aggregated (MAA) injection solution 4 millicurie (4 millicuries Intravenous Contrast Given 05/31/18 2045)     Initial Impression / Assessment and Plan / ED Course  I have reviewed the triage vital signs and the nursing notes.  Pertinent labs & imaging results that were available during my care of the patient were reviewed by me and considered in my medical decision making (see chart for details).    BUn/Cr elevated.  Pt given IVFs.  Unable to get CT angio, so we obtained a VQ scan.  The VQ scan was negative.  He is instructed to f/u with pcp and to see pulmonology if sob worsens.  Cardiac work up negative.  Final Clinical Impressions(s) / ED Diagnoses   Final diagnoses:  Dyspnea on exertion  Thrombophlebitis of superficial veins of right lower extremity  AKI (acute kidney injury) [facility])    ED Discharge Orders    None       Isla Pence, MD 05/31/18 2154

## 2018-05-31 NOTE — ED Notes (Signed)
RN found pt up and tryng to walk to the bathroom.  RN unhooked his monitoring equipment and pt ambulated to the bathroom per his direction.

## 2018-06-01 ENCOUNTER — Other Ambulatory Visit: Payer: Self-pay

## 2018-06-07 ENCOUNTER — Telehealth: Payer: Self-pay

## 2018-06-07 NOTE — Telephone Encounter (Signed)
Returned call to Pt.  No answer.  Left VM for Pt.  Pt with recent ER visit for SOB/chest pain.  Advised Pt if he thought he needed to be seen within the next week for further work up of sob/chest pain to call Butch Penny at the [facility].  Phone # given.  Advised if Pt just wanted to discuss hospitalization and possibly see Dr. Rayann Heman in the next few weeks this nurse would call Pt again on Friday.  Will continue to monitor.

## 2018-06-14 ENCOUNTER — Telehealth: Payer: Self-pay | Admitting: Internal Medicine

## 2018-06-14 NOTE — Telephone Encounter (Signed)
Returned call to Pt.  Pt with recent hospitalization and had his Xarelto restarted.  Pt has multiple surgeries coming up in January and is also do for his yearly at that time.  Will move appt up so Pt can get his yearly appt and cardiac clearance.

## 2018-06-14 NOTE — Telephone Encounter (Signed)
  Dr. [name] would like to speak with the nurse in regards to his Xarelto and his January appt

## 2018-06-19 ENCOUNTER — Encounter: Payer: Self-pay | Admitting: Pulmonary Disease

## 2018-06-19 ENCOUNTER — Ambulatory Visit: Payer: Managed Care, Other (non HMO) | Admitting: Pulmonary Disease

## 2018-06-19 VITALS — BP 124/84 | HR 78 | Ht 73.0 in | Wt 240.0 lb

## 2018-06-19 DIAGNOSIS — R0609 Other forms of dyspnea: Secondary | ICD-10-CM

## 2018-06-19 DIAGNOSIS — R0602 Shortness of breath: Secondary | ICD-10-CM

## 2018-06-19 NOTE — Progress Notes (Signed)
Synopsis: Self referral in November 2019 for SOB.   Subjective:   PATIENT ID: Casey [name] Alias GENDER: male DOB: [birth year], MRN: [id number]  Chief Complaint  Patient presents with  . Consult    Sob since october, history of afib and on blood thinner, he has a recent ultra sound for Dvt.     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 PMH of atrial fibrillation as well as a several week hospitalization for respiratory failure requiring lung biopsy by Dr. Arlyce Dice several years ago.  He was out of work for approximately 3 months.  There was a question of whether or not he developed hantavirus?  After a trip out [place].  He also has a history of a schwannoma removal from an intercostal nerve.  He has always had a elevated left hemidiaphragm. Father with history of DVT X 2. He was on xarelto in the past. He traveled to [place] recently. He felt some right calf pain, no swelling or warmth. The past persisted for the past two weeks. D-dimer was positive 3, US duplex with superficial thrombophebitis.  The patient is a Stage manager.  He does play tennis once a week.  He does feel that while he is playing tennis he gets more short of breath on occasion.  He has noticed some limitation in his physical activity when he significantly exerts himself.  He mainly just wants to make sure there is not anything wrong with his lungs that may cause him to be short of breath.  He did have a recent chest x-ray that looked normal as well as a VQ scan that was completed in the ED due to his elevation of his serum creatinine that was low probability.  He denies chest pain or palpitations.  He does state he knows when he is in atrial fibrillation.  He is very sensitive to to knowing when he is in atrial fibrillation and has had ablation in the past.    Past Medical History:  Diagnosis Date  . 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HCC)    ongoing evaluation for elevated R>L adosterone secretion  . Arthritis    . Atrial enlargement, bilateral   . Atrial fibrillation ([place])   . Atrial fibrillation (HCC)    paroxysmal  . Atrial flutter ([place])   . Complication of anesthesia    could not urinate after UHR, needed cath, mental fog sinc cervical fusion 08-09-16   . Dysrhythmia    a-fib-had ablation x2  . GERD (gastroesophageal reflux disease)   . Hyperaldosteronism ([place])   . Hypertension   . Kidney cysts    "multiple"  . Nephrolithiasis   . Pneumonia 1990's  . Pre-diabetes   . Prostatitis   . Renal insufficiency    "Cr 1.6"     Family History  Problem Relation Age of Onset  . Atrial fibrillation Unknown        sister has had 2 afib ablations  . Atrial fibrillation Sister      Past Surgical History:  Procedure Laterality Date  . ABLATION    . ATRIAL FIBRILLATION ABLATION N/A 09/28/2012   Procedure: ATRIAL FIBRILLATION ABLATION;  Surgeon: Thompson Grayer, MD;  Location: [facility] CATH LAB;  Service: Cardiovascular;  Laterality: N/A;  . atrial fibrillation ablation with CTI ablation  05/27/11, 09/28/12   ablation for afib and atrial flutter by Dr Rayann Heman  . BACK SURGERY  08/09/2016   C 5 6  C 6 to C 7 cervical fusion  . CARDIOVERSION  07/28/2011   Procedure: CARDIOVERSION;  Surgeon: Lelon Perla, MD;  Location: [place];  Service: Cardiovascular;  Laterality: N/A;  . COLONOSCOPY WITH PROPOFOL N/A 03/03/2015   Procedure: COLONOSCOPY WITH PROPOFOL;  Surgeon: Garlan Fair, MD;  Location: [facility];  Service: Gastroenterology;  Laterality: N/A;  . double j stent placement  06/2004   left  . ESOPHAGOGASTRODUODENOSCOPY (EGD) WITH PROPOFOL N/A 03/03/2015   Procedure: ESOPHAGOGASTRODUODENOSCOPY (EGD) WITH PROPOFOL;  Surgeon: Garlan Fair, MD;  [facility];  Service: Gastroenterology;  Laterality: N/A;  . ESOPHAGOGASTRODUODENOSCOPY (EGD) WITH PROPOFOL N/A 10/25/2016   Procedure: ESOPHAGOGASTRODUODENOSCOPY (EGD) WITH PROPOFOL;  Surgeon: Garlan Fair, MD;  [facility];  Service:  Endoscopy;  Laterality: N/A;  . INGUINAL HERNIA REPAIR     "as a child; ? right"  . INGUINAL HERNIA REPAIR Left 06/23/2016   Procedure: OPEN LEFT INGUINAL HERNIA  REPAIR WITH MESH;  Surgeon: Johnathan Hausen, MD;  Location: [place];  Service: General;  Laterality: Left;  . INSERTION OF MESH Left 06/23/2016   Procedure: INSERTION OF MESH;  Surgeon: Johnathan Hausen, MD;  Location: [place];  Service: General;  Laterality: Left;  . LUNG BIOPSY  1993  . NASAL SEPTOPLASTY W/ TURBINOPLASTY  07/2006  . prostate biopsy  2011  . schwannoma removal  ~01/2010   left thorax  . TEE WITHOUT CARDIOVERSION N/A 09/27/2012   Procedure: TRANSESOPHAGEAL ECHOCARDIOGRAM (TEE);  Surgeon: Larey Dresser, MD;  Location: [place];  Service: Cardiovascular;  Laterality: N/A;  . UMBILICAL HERNIA REPAIR  07/2006    Social History   Socioeconomic History  . Marital status: Married    Spouse name: Not on file  . Number of children: Not on file  . Years of education: Not on file  . Highest education level: Not on file  Occupational History  . Not on file  Social Needs  . Financial resource strain: Not on file  . Food insecurity:    Worry: Not on file    Inability: Not on file  . Transportation needs:    Medical: Not on file    Non-medical: Not on file  Tobacco Use  . Smoking status: Never Smoker  . Smokeless tobacco: Never Used  Substance and Sexual Activity  . Alcohol use: Yes    Alcohol/week: 2.0 standard drinks    Types: 2 Shots of liquor per week    Comment: occasional, 05-11-2016 Per pt 3 drinks per wk  . Drug use: No    Comment: 05-11-2016 per pt no  . Sexual activity: Yes  Lifestyle  . Physical activity:    Days per week: Not on file    Minutes per session: Not on file  . Stress: Not on file  Relationships  . Social connections:    Talks on phone: Not on file    Gets together: Not on file    Attends religious service: Not on file    Active member of club  or organization: Not on file    Attends meetings of clubs or organizations: Not on file    Relationship status: Not on file  . Intimate partner violence:    Fear of current or ex partner: Not on file    Emotionally abused: Not on file    Physically abused: Not on file    Forced sexual activity: Not on file  Other Topics Concern  . Not on file  Social History Narrative   ** Merged History Encounter **  ENT physician in [place].     Allergies  Allergen Reactions  . Aldactone [Spironolactone]     Breast tissue enlargement  . Codeine Other (See Comments)    Headache  headache  . Lisinopril Cough and Other (See Comments)  . Nexium [Esomeprazole Magnesium] Other (See Comments)    Diarrhea   . Sulfa Antibiotics Rash  . Sulfa Drugs Cross Reactors Rash  . Sulfamethoxazole Rash     Outpatient Medications Prior to Visit  Medication Sig Dispense Refill  . allopurinol (ZYLOPRIM) 100 MG tablet Take 100 mg by mouth 2 (two) times daily.     Marland Kitchen atorvastatin (LIPITOR) 10 MG tablet Take 10 mg by mouth every evening.     Marland Kitchen CIALIS 20 MG tablet Take 20 mg by mouth daily as needed for erectile dysfunction.   11  . diltiazem (CARDIZEM CD) 360 MG 24 hr capsule Take 360 mg by mouth every evening.  7  . eplerenone (INSPRA) 50 MG tablet Take 50 mg by mouth 2 (two) times daily.    Marland Kitchen FIBER ADULT GUMMIES PO Take 1 tablet by mouth daily.    . hydrochlorothiazide (MICROZIDE) 12.5 MG capsule Take 12.5 mg by mouth every evening.     Marland Kitchen ibuprofen (ADVIL,MOTRIN) 200 MG tablet Take 200 mg by mouth daily as needed for headache or moderate pain.     Marland Kitchen losartan (COZAAR) 100 MG tablet Take 100 mg by mouth every evening.    . metFORMIN (GLUCOPHAGE-XR) 500 MG 24 hr tablet Take 500 mg by mouth 2 (two) times daily.    Marland Kitchen omeprazole (PRILOSEC) 40 MG capsule Take 40 mg by mouth daily before supper.     Marland Kitchen oxybutynin (DITROPAN-XL) 10 MG 24 hr tablet Take 10 mg by mouth daily.    . sertraline (ZOLOFT) 50 MG tablet  Take 1 tablet (50 mg total) by mouth every evening. 90 tablet 1  . XARELTO 20 MG TABS tablet TAKE 1 TABLET(20 MG) BY MOUTH DAILY WITH SUPPER (Patient taking differently: Take 20 mg by mouth every evening. ) 30 tablet 5   No facility-administered medications prior to visit.     Review of Systems  Constitutional: Negative for chills, fever, malaise/fatigue and weight loss.  HENT: Negative for hearing loss, sore throat and tinnitus.   Eyes: Negative for blurred vision and double vision.  Respiratory: Positive for shortness of breath. Negative for cough, hemoptysis, sputum production, wheezing and stridor.   Cardiovascular: Negative for chest pain, palpitations, orthopnea, leg swelling and PND.  Gastrointestinal: Negative for abdominal pain, constipation, diarrhea, heartburn, nausea and vomiting.  Genitourinary: Negative for dysuria, hematuria and urgency.  Musculoskeletal: Negative for joint pain and myalgias.  Skin: Negative for itching and rash.  Neurological: Negative for dizziness, tingling, weakness and headaches.  Endo/Heme/Allergies: Negative for environmental allergies. Does not bruise/bleed easily.  Psychiatric/Behavioral: Negative for depression. The patient is not nervous/anxious and does not have insomnia.   All other systems reviewed and are negative.    Objective:  Physical Exam  Constitutional: He is oriented to person, place, and time. He appears well-developed and well-nourished. No distress.  HENT:  Head: Normocephalic and atraumatic.  Mouth/Throat: Oropharynx is clear and moist.  Eyes: Pupils are equal, round, and reactive to light. Conjunctivae are normal. No scleral icterus.  Neck: Neck supple. No JVD present. No tracheal deviation present.  Cardiovascular: Normal rate, regular rhythm, normal heart sounds and intact distal pulses.  No murmur heard. Pulmonary/Chest: Effort normal and breath sounds normal. No accessory muscle usage  or stridor. No tachypnea. No  respiratory distress. He has no wheezes. He has no rhonchi. He has no rales.  Abdominal: Soft. Bowel sounds are normal. He exhibits no distension. There is no tenderness.  Musculoskeletal: He exhibits edema. He exhibits no tenderness.  Minimal edema in the bilateral lower extremities  Lymphadenopathy:    He has no cervical adenopathy.  Neurological: He is alert and oriented to person, place, and time.  Skin: Skin is warm and dry. Capillary refill takes less than 2 seconds. No rash noted.  Psychiatric: He has a normal mood and affect. His behavior is normal.  Vitals reviewed.    Vitals:   06/19/18 0939  BP: 124/84  Pulse: 78  SpO2: 96%  Weight: 240 lb (108.9 kg)  Height: 6\' 1"  (1.854 m)   96% on  RA BMI Readings from Last 3 Encounters:  06/19/18 31.66 kg/m  05/31/18 31.66 kg/m  08/05/17 31.98 kg/m   Wt Readings from Last 3 Encounters:  06/19/18 240 lb (108.9 kg)  05/31/18 240 lb (108.9 kg)  08/05/17 242 lb 6.4 oz (110 kg)     CBC    Component Value Date/Time   WBC 12.5 (H) 05/31/2018 1609   RBC 4.81 05/31/2018 1609   HGB 13.2 05/31/2018 1609   HCT 43.0 05/31/2018 1609   PLT 268 05/31/2018 1609   MCV 89.4 05/31/2018 1609   MCH 27.4 05/31/2018 1609   MCHC 30.7 05/31/2018 1609   RDW 15.1 05/31/2018 1609   LYMPHSABS 2.1 09/20/2012 0914   MONOABS 0.7 09/20/2012 0914   EOSABS 0.1 09/20/2012 0914   BASOSABS 0.0 09/20/2012 0914     Chest Imaging:  05/31/2018 ventilation perfusion scan: Low probability 05/31/2018 chest x-ray: No acute abnormalities  The patient's images have been independently reviewed by me.    Pulmonary Functions Testing Results: No flowsheet data found.  FeNO: None   Pathology: None   Echocardiogram:  09/27/2012 TEE: Normal ejection fraction 55 to 60% no PFO no LV thrombus LA thrombus  Heart Catheterization: None     Assessment & Plan:   SOB (shortness of breath) - Plan: Pulmonary function test  Discussion:  This is a 62 year old  gentleman who is pretty much a lifelong non-smoker has had occasional cigars in the past.  No real significant occupational exposures.  He does work as a Artist.  He has had a few bouts of shortness of breath related to significant exertion.  His overall functional status seems good.  His most recent work-up has been relatively negative for any pulmonary findings.  His d-dimer is explained by his thrombophlebitis that was found on ultrasound.  He has a relatively normal-appearing chest x-ray except for mild elevation of the left hemidiaphragm.  I think the least invasive option for evaluation of his shortness of breath would be the completion of complete pulmonary function test.  Also discussed the utility of cardiopulmonary exercise testing for the evaluation of shortness of breath.  I will call him with results once we obtain the PFTs.  If there are any subtle abnormalities in the PFTs we can discuss axial imaging of the chest.  Greater than 50% of this patient's 45-minute office visit was spent discussing the above recommendations and diagnostic approach.   Current Outpatient Medications:  .  allopurinol (ZYLOPRIM) 100 MG tablet, Take 100 mg by mouth 2 (two) times daily. , Disp: , Rfl:  .  atorvastatin (LIPITOR) 10 MG tablet, Take 10 mg by mouth every evening. , Disp: , Rfl:  .  CIALIS 20 MG tablet, Take 20 mg by mouth daily as needed for erectile dysfunction. , Disp: , Rfl: 11 .  diltiazem (CARDIZEM CD) 360 MG 24 hr capsule, Take 360 mg by mouth every evening., Disp: , Rfl: 7 .  eplerenone (INSPRA) 50 MG tablet, Take 50 mg by mouth 2 (two) times daily., Disp: , Rfl:  .  FIBER ADULT GUMMIES PO, Take 1 tablet by mouth daily., Disp: , Rfl:  .  hydrochlorothiazide (MICROZIDE) 12.5 MG capsule, Take 12.5 mg by mouth every evening. , Disp: , Rfl:  .  ibuprofen (ADVIL,MOTRIN) 200 MG tablet, Take 200 mg by mouth daily as needed for headache or moderate pain. , Disp: , Rfl:  .  losartan (COZAAR) 100 MG  tablet, Take 100 mg by mouth every evening., Disp: , Rfl:  .  metFORMIN (GLUCOPHAGE-XR) 500 MG 24 hr tablet, Take 500 mg by mouth 2 (two) times daily., Disp: , Rfl:  .  omeprazole (PRILOSEC) 40 MG capsule, Take 40 mg by mouth daily before supper. , Disp: , Rfl:  .  oxybutynin (DITROPAN-XL) 10 MG 24 hr tablet, Take 10 mg by mouth daily., Disp: , Rfl:  .  sertraline (ZOLOFT) 50 MG tablet, Take 1 tablet (50 mg total) by mouth every evening., Disp: 90 tablet, Rfl: 1 .  XARELTO 20 MG TABS tablet, TAKE 1 TABLET(20 MG) BY MOUTH DAILY WITH SUPPER (Patient taking differently: Take 20 mg by mouth every evening. ), Disp: 30 tablet, Rfl: 5   Garner Nash, DO [facility] 06/19/2018 5:39 PM

## 2018-06-19 NOTE — Patient Instructions (Addendum)
Thank you for visiting Dr. Valeta Harms at [facility]. Today we recommend the following: Orders Placed This Encounter  Procedures  . Pulmonary function test   We will call with you with the results.

## 2018-06-26 ENCOUNTER — Encounter: Payer: Self-pay | Admitting: Internal Medicine

## 2018-07-03 ENCOUNTER — Ambulatory Visit: Payer: Managed Care, Other (non HMO) | Admitting: Internal Medicine

## 2018-07-03 ENCOUNTER — Encounter: Payer: Self-pay | Admitting: Internal Medicine

## 2018-07-03 VITALS — BP 124/76 | HR 62 | Ht 73.0 in | Wt 238.4 lb

## 2018-07-03 DIAGNOSIS — I480 Paroxysmal atrial fibrillation: Secondary | ICD-10-CM | POA: Diagnosis not present

## 2018-07-03 DIAGNOSIS — I10 Essential (primary) hypertension: Secondary | ICD-10-CM

## 2018-07-03 NOTE — Progress Notes (Signed)
PCP: Leeroy Cha, MD Primary Cardiologist: Dr Marlou Porch Primary EP: Dr Carlyon Shadow is a 62 y.o. male who presents today for routine electrophysiology followup.  Since last being seen in our clinic, the patient reports doing very well. Recently has had exertional dypsnea.  2014 cardiac CT (Reviewed today) revealed calcium score of 0 and no CAD.  Recent ED visit reviewed. He is back on xarelto. Today, he denies symptoms of palpitations, chest pain,  lower extremity edema, dizziness, presyncope, or syncope.  The patient is otherwise without complaint today.   Past Medical History:  Diagnosis Date  . 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HCC)    ongoing evaluation for elevated R>L adosterone secretion  . Arthritis   . Atrial enlargement, bilateral   . Atrial fibrillation ([place])   . Atrial fibrillation (HCC)    paroxysmal  . Atrial flutter ([place])   . Complication of anesthesia    could not urinate after UHR, needed cath, mental fog sinc cervical fusion 08-09-16   . Dysrhythmia    a-fib-had ablation x2  . GERD (gastroesophageal reflux disease)   . Hyperaldosteronism ([place])   . Hypertension   . Kidney cysts    "multiple"  . Nephrolithiasis   . Pneumonia 1990's  . Pre-diabetes   . Prostatitis   . Renal insufficiency    "Cr 1.6"   Past Surgical History:  Procedure Laterality Date  . ABLATION    . ATRIAL FIBRILLATION ABLATION N/A 09/28/2012   Procedure: ATRIAL FIBRILLATION ABLATION;  Surgeon: Thompson Grayer, MD;  Location: [facility] CATH LAB;  Service: Cardiovascular;  Laterality: N/A;  . atrial fibrillation ablation with CTI ablation  05/27/11, 09/28/12   ablation for afib and atrial flutter by Dr Rayann Heman  . BACK SURGERY  08/09/2016   C 5 6  C 6 to C 7 cervical fusion  . CARDIOVERSION  07/28/2011   Procedure: CARDIOVERSION;  Surgeon: Lelon Perla, MD;  Location: [place];  Service: Cardiovascular;  Laterality: N/A;  . COLONOSCOPY WITH PROPOFOL N/A 03/03/2015   Procedure: COLONOSCOPY  WITH PROPOFOL;  Surgeon: Garlan Fair, MD;  [facility];  Service: Gastroenterology;  Laterality: N/A;  . double j stent placement  06/2004   left  . ESOPHAGOGASTRODUODENOSCOPY (EGD) WITH PROPOFOL N/A 03/03/2015   Procedure: ESOPHAGOGASTRODUODENOSCOPY (EGD) WITH PROPOFOL;  Surgeon: Garlan Fair, MD;  [facility];  Service: Gastroenterology;  Laterality: N/A;  . ESOPHAGOGASTRODUODENOSCOPY (EGD) WITH PROPOFOL N/A 10/25/2016   Procedure: ESOPHAGOGASTRODUODENOSCOPY (EGD) WITH PROPOFOL;  Surgeon: Garlan Fair, MD;  [facility];  Service: Endoscopy;  Laterality: N/A;  . INGUINAL HERNIA REPAIR     "as a child; ? right"  . INGUINAL HERNIA REPAIR Left 06/23/2016   Procedure: OPEN LEFT INGUINAL HERNIA  REPAIR WITH MESH;  Surgeon: Johnathan Hausen, MD;  Location: [place];  Service: General;  Laterality: Left;  . INSERTION OF MESH Left 06/23/2016   Procedure: INSERTION OF MESH;  Surgeon: Johnathan Hausen, MD;  Location: [place];  Service: General;  Laterality: Left;  . LUNG BIOPSY  1993  . NASAL SEPTOPLASTY W/ TURBINOPLASTY  07/2006  . prostate biopsy  2011  . schwannoma removal  ~01/2010   left thorax  . TEE WITHOUT CARDIOVERSION N/A 09/27/2012   Procedure: TRANSESOPHAGEAL ECHOCARDIOGRAM (TEE);  Surgeon: Larey Dresser, MD;  Location: [place];  Service: Cardiovascular;  Laterality: N/A;  . UMBILICAL HERNIA REPAIR  07/2006    ROS- all systems are reviewed and negatives except as  per HPI above  Current Outpatient Medications  Medication Sig Dispense Refill  . allopurinol (ZYLOPRIM) 100 MG tablet Take 100 mg by mouth 2 (two) times daily.     Marland Kitchen atorvastatin (LIPITOR) 10 MG tablet Take 10 mg by mouth every evening.     Marland Kitchen CIALIS 20 MG tablet Take 20 mg by mouth daily as needed for erectile dysfunction.   11  . diltiazem (CARDIZEM CD) 360 MG 24 hr capsule Take 360 mg by mouth every evening.  7  . eplerenone (INSPRA) 50 MG tablet  Take 50 mg by mouth 2 (two) times daily.    Marland Kitchen FIBER ADULT GUMMIES PO Take 1 tablet by mouth daily.    . hydrochlorothiazide (MICROZIDE) 12.5 MG capsule Take 12.5 mg by mouth every evening.     Marland Kitchen ibuprofen (ADVIL,MOTRIN) 200 MG tablet Take 200 mg by mouth daily as needed for headache or moderate pain.     Marland Kitchen losartan (COZAAR) 100 MG tablet Take 100 mg by mouth every evening.    . metFORMIN (GLUCOPHAGE-XR) 500 MG 24 hr tablet Take 500 mg by mouth 2 (two) times daily.    Marland Kitchen omeprazole (PRILOSEC) 40 MG capsule Take 40 mg by mouth daily before supper.     Marland Kitchen oxybutynin (DITROPAN-XL) 10 MG 24 hr tablet Take 10 mg by mouth daily.    . sertraline (ZOLOFT) 50 MG tablet Take 1 tablet (50 mg total) by mouth every evening. 90 tablet 1  . XARELTO 20 MG TABS tablet TAKE 1 TABLET(20 MG) BY MOUTH DAILY WITH SUPPER 30 tablet 5   No current facility-administered medications for this visit.     Physical Exam: Vitals:   07/03/18 1148  BP: 124/76  Pulse: 62  SpO2: 95%  Weight: 238 lb 6.4 oz (108.1 kg)  Height: 6\' 1"  (1.854 m)    GEN- The patient is well appearing, alert and oriented x 3 today.   Head- normocephalic, atraumatic Eyes-  Sclera clear, conjunctiva pink Ears- hearing intact Oropharynx- clear Lungs- Clear to ausculation bilaterally, normal work of breathing Heart- Regular rate and rhythm, no murmurs, rubs or gallops, PMI not laterally displaced GI- soft, NT, ND, + BS Extremities- no clubbing, cyanosis, or edema  Wt Readings from Last 3 Encounters:  07/03/18 238 lb 6.4 oz (108.1 kg)  06/19/18 240 lb (108.9 kg)  05/31/18 240 lb (108.9 kg)    EKG tracing ordered today is personally reviewed and shows sinus rhythm 62 bpm, PR 244 msec, LAD  Assessment and Plan:  1. afib Well controlled off AAD therapy chads2vasc score is 1 (HTN) As per guideliens, he was not on anticoagulation.  He recently developed thrombophlebitis and has since been on xarelto. No changes  2. preop Given recent  dypsnea, will order treadmill test and echo.  If low risk, proceed with surgery  3. hypokalemia Continues to be an issues Followed by Dr Buddy Duty  4. HTN Stable No change required today  Return in a year  Thompson Grayer MD, [facility] 07/03/2018 12:12 PM

## 2018-07-03 NOTE — Patient Instructions (Addendum)
Medication Instructions:  Your physician recommends that you continue on your current medications as directed. Please refer to the Current Medication list given to you today.  Labwork: None ordered.  Testing/Procedures: Your physician has requested that you have an echocardiogram. Echocardiography is a painless test that uses sound waves to create images of your heart. It provides your doctor with information about the size and shape of your heart and how well your heart's chambers and valves are working. This procedure takes approximately one hour. There are no restrictions for this procedure.  Your physician has requested that you have an exercise tolerance test. For further information please visit [email or web address]. Please also follow instruction sheet, as given.   Follow-Up: Your physician recommends that you schedule a follow-up appointment in:   Please schedule the GXT and Echo for the same day.  12 months with Dr Rayann Heman  Any Other Special Instructions Will Be Listed Below (If Applicable).     If you need a refill on your cardiac medications before your next appointment, please call your pharmacy.

## 2018-07-18 ENCOUNTER — Other Ambulatory Visit: Payer: Self-pay | Admitting: Internal Medicine

## 2018-07-18 ENCOUNTER — Ambulatory Visit (INDEPENDENT_AMBULATORY_CARE_PROVIDER_SITE_OTHER): Payer: Managed Care, Other (non HMO)

## 2018-07-18 ENCOUNTER — Ambulatory Visit (HOSPITAL_COMMUNITY): Payer: Managed Care, Other (non HMO) | Attending: Cardiovascular Disease

## 2018-07-18 ENCOUNTER — Ambulatory Visit (INDEPENDENT_AMBULATORY_CARE_PROVIDER_SITE_OTHER): Payer: Managed Care, Other (non HMO) | Admitting: Internal Medicine

## 2018-07-18 ENCOUNTER — Other Ambulatory Visit: Payer: Self-pay

## 2018-07-18 DIAGNOSIS — R0609 Other forms of dyspnea: Secondary | ICD-10-CM

## 2018-07-18 DIAGNOSIS — R9439 Abnormal result of other cardiovascular function study: Secondary | ICD-10-CM

## 2018-07-18 DIAGNOSIS — I480 Paroxysmal atrial fibrillation: Secondary | ICD-10-CM | POA: Diagnosis not present

## 2018-07-18 DIAGNOSIS — I10 Essential (primary) hypertension: Secondary | ICD-10-CM | POA: Diagnosis not present

## 2018-07-18 LAB — EXERCISE TOLERANCE TEST
CHL CUP MPHR: 156 {beats}/min
CSEPED: 9 min
CSEPHR: 86 %
CSEPPHR: 134 {beats}/min
Estimated workload: 10.2 METS
Exercise duration (sec): 8 s
RPE: 17
Rest HR: 67 {beats}/min

## 2018-07-18 NOTE — Patient Instructions (Addendum)
Medication Instructions: Your physician recommends that you continue on your current medications as directed. Please refer to the Current Medication list given to you today.   Labwork: Your physician recommends that you return for lab work in: Thursday 07/20/18 for CBC and BMP   Procedures/Testing: Your physician has requested that you have a cardiac catheterization. Cardiac catheterization is used to diagnose and/or treat various heart conditions. Doctors may recommend this procedure for a number of different reasons. The most common reason is to evaluate chest pain. Chest pain can be a symptom of coronary artery disease (CAD), and cardiac catheterization can show whether plaque is narrowing or blocking your heart's arteries. This procedure is also used to evaluate the valves, as well as measure the blood flow and oxygen levels in different parts of your heart. For further information please visit [email or web address]. Please follow instruction sheet, as given.    Follow-Up: Your physician recommends that you schedule a follow-up appointment in: 4 weeks after 07/25/18 for post cath follow up with any APP  Any Additional Special Instructions Will Be Listed Below (If Applicable).     If you need a refill on your cardiac medications before your next appointment, please call your pharmacy.         [facility] OFFICE [place], [place]  [place] [address] Dept: [phone] Loc: [phone]  [facility]  [birth year]  You are scheduled for a Cardiac Catheterization on Tuesday, December 31 with Dr. Daneen Schick.  1. Please arrive at the [facility] (Main Entrance A) at [facility]: [address] [place], [place] [address] at 6:30AM (This time is four hours before your procedure to ensure your preparation). Free valet parking service is available.   Special note: Every effort is made to  have your procedure done on time. Please understand that emergencies sometimes delay scheduled procedures.  2. Diet: Do not eat solid foods after midnight.  The patient may have clear liquids until 5am upon the day of the procedure.  3. Labs: You will need to have blood drawn on Thursday, December 26 at [facility] at [facility]. [address]  Open: 7:30am - 5pm    Phone: [phone]. You do not need to be fasting.  4. Medication instructions in preparation for your procedure:   Contrast Allergy: No   Stop Losartan Monday 07/24/18   Stop Xarelto on Sunday 07/23/18  Do not take Cialis until after your cath.  Hold HCTZ the morning of your cath.  Do not take Diabetes Med Glucophage (Metformin) on the day of the procedure and HOLD 48 HOURS AFTER THE PROCEDURE.  On the morning of your procedure, take your Aspirin, 81MG  and any morning medicines NOT listed above.  You may use sips of water.  5. Plan for one night stay--bring personal belongings. 6. Bring a current list of your medications and current insurance cards. 7. You MUST have a responsible person to drive you home. 8. Someone MUST be with you the first 24 hours after you arrive home or your discharge will be delayed. 9. Please wear clothes that are easy to get on and off and wear slip-on shoes.  Thank you for allowing [place] to care for you!   -- [facility]

## 2018-07-18 NOTE — Progress Notes (Signed)
Patient Care Team: Leeroy Cha, MD as PCP - General (Internal Medicine) Jerline Pain, MD (Cardiology)   HPI  Casey [name] is a 62 y.o. male Seen as an addon today after presenting for  GXT 2/2 new onset dyspnea  ECG noted to haave new anterolateral q waves from 2015  No chest pain, but some and some worsening of exercise tolerance  CT Ca Score 2013 neg  Seen in the ER and Cr elevated and tn neg, and +Ddimer> VQ low prob  No edema but some leg pain after recent trip to [place]     Records and Results Reviewed   Past Medical History:  Diagnosis Date  . 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HCC)    ongoing evaluation for elevated R>L adosterone secretion  . Arthritis   . Atrial enlargement, bilateral   . Atrial fibrillation ([place])   . Atrial fibrillation (HCC)    paroxysmal  . Atrial flutter ([place])   . Complication of anesthesia    could not urinate after UHR, needed cath, mental fog sinc cervical fusion 08-09-16   . Dysrhythmia    a-fib-had ablation x2  . GERD (gastroesophageal reflux disease)   . Hyperaldosteronism ([place])   . Hypertension   . Kidney cysts    "multiple"  . Nephrolithiasis   . Pneumonia 1990's  . Pre-diabetes   . Prostatitis   . Renal insufficiency    "Cr 1.6"    Past Surgical History:  Procedure Laterality Date  . ABLATION    . ATRIAL FIBRILLATION ABLATION N/A 09/28/2012   Procedure: ATRIAL FIBRILLATION ABLATION;  Surgeon: Thompson Grayer, MD;  Location: [facility] CATH LAB;  Service: Cardiovascular;  Laterality: N/A;  . atrial fibrillation ablation with CTI ablation  05/27/11, 09/28/12   ablation for afib and atrial flutter by Dr Rayann Heman  . BACK SURGERY  08/09/2016   C 5 6  C 6 to C 7 cervical fusion  . CARDIOVERSION  07/28/2011   Procedure: CARDIOVERSION;  Surgeon: Lelon Perla, MD;  Location: [facility];  Service: Cardiovascular;  Laterality: N/A;  . COLONOSCOPY WITH PROPOFOL N/A 03/03/2015   Procedure: COLONOSCOPY WITH PROPOFOL;  Surgeon:  Garlan Fair, MD;  Location: [facility];  Service: Gastroenterology;  Laterality: N/A;  . double j stent placement  06/2004   left  . ESOPHAGOGASTRODUODENOSCOPY (EGD) WITH PROPOFOL N/A 03/03/2015   Procedure: ESOPHAGOGASTRODUODENOSCOPY (EGD) WITH PROPOFOL;  Surgeon: Garlan Fair, MD;  Location: [facility];  Service: Gastroenterology;  Laterality: N/A;  . ESOPHAGOGASTRODUODENOSCOPY (EGD) WITH PROPOFOL N/A 10/25/2016   Procedure: ESOPHAGOGASTRODUODENOSCOPY (EGD) WITH PROPOFOL;  Surgeon: Garlan Fair, MD;  Location: [facility];  Service: Endoscopy;  Laterality: N/A;  . INGUINAL HERNIA REPAIR     "as a child; ? right"  . INGUINAL HERNIA REPAIR Left 06/23/2016   Procedure: OPEN LEFT INGUINAL HERNIA  REPAIR WITH MESH;  Surgeon: Johnathan Hausen, MD;  Location: [facility];  Service: General;  Laterality: Left;  . INSERTION OF MESH Left 06/23/2016   Procedure: INSERTION OF MESH;  Surgeon: Johnathan Hausen, MD;  Location: [facility];  Service: General;  Laterality: Left;  . LUNG BIOPSY  1993  . NASAL SEPTOPLASTY W/ TURBINOPLASTY  07/2006  . prostate biopsy  2011  . schwannoma removal  ~01/2010   left thorax  . TEE WITHOUT CARDIOVERSION N/A 09/27/2012   Procedure: TRANSESOPHAGEAL ECHOCARDIOGRAM (TEE);  Surgeon: Larey Dresser, MD;  Location: [facility];  Service: Cardiovascular;  Laterality: N/A;  .  UMBILICAL HERNIA REPAIR  07/2006    No outpatient medications have been marked as taking for the 07/18/18 encounter (Office Visit) with Deboraha Sprang, MD.    Allergies  Allergen Reactions  . Aldactone [Spironolactone]     Breast tissue enlargement  . Codeine Other (See Comments)    Headache  headache  . Lisinopril Cough and Other (See Comments)  . Nexium [Esomeprazole Magnesium] Other (See Comments)    Diarrhea   . Sulfa Antibiotics Rash  . Sulfa Drugs Cross Reactors Rash  . Sulfamethoxazole Rash      Review of Systems negative except from HPI and  PMH  Physical Exam BP 148/10 Well developed and well nourished in no acute distress HENT normal E scleral and icterus clear Neck Supple Regular rate and rhythm, no murmurs gallops or rub No clubbing cyanosis   Edema Alert and oriented, grossly normal motor and sensory function Skin Warm and Dry  ECG as noted above  Assessment and  Plan  Abnormal ECG  Dyspnea on exertion  REnal insufficiency Gr 3  anticoagulation intermittent  AF s/p Ablation    Atrial fibrillation is been quiescent.  Dyspnea mechanism is not clear.  VQ scan was low probability pretest probability was moderate.  Renal insufficiency precluded CTA.  His ECG shows new inferolateral Q waves from 2017.  Echo preliminarily demonstrates no significant wall motion abnormality; however, I reviewed with Dr. Greggory Brandy and Dr. Marolyn Hammock load from catheterization can be better controlled then with CTA and the abnormal ECG with his symptoms begs resolution of the presence or absence of coronary disease.  Hence, we will undertake catheterization.  Reviewed risks and benefits.  Will be scheduled for next Tuesday.  Current medicines are reviewed at length with the patient today .  The patient does not  have concerns regarding medicines.

## 2018-07-18 NOTE — H&P (View-Only) (Signed)
Patient Care Team: Leeroy Cha, MD as PCP - General (Internal Medicine) Jerline Pain, MD (Cardiology)   HPI  Casey [name] is a 62 y.o. male Seen as an addon today after presenting for  GXT 2/2 new onset dyspnea  ECG noted to haave new anterolateral q waves from 2015  No chest pain, but some and some worsening of exercise tolerance  CT Ca Score 2013 neg  Seen in the ER and Cr elevated and tn neg, and +Ddimer> VQ low prob  No edema but some leg pain after recent trip to [place]     Records and Results Reviewed   Past Medical History:  Diagnosis Date  . 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HCC)    ongoing evaluation for elevated R>L adosterone secretion  . Arthritis   . Atrial enlargement, bilateral   . Atrial fibrillation ([place])   . Atrial fibrillation (HCC)    paroxysmal  . Atrial flutter ([place])   . Complication of anesthesia    could not urinate after UHR, needed cath, mental fog sinc cervical fusion 08-09-16   . Dysrhythmia    a-fib-had ablation x2  . GERD (gastroesophageal reflux disease)   . Hyperaldosteronism ([place])   . Hypertension   . Kidney cysts    "multiple"  . Nephrolithiasis   . Pneumonia 1990's  . Pre-diabetes   . Prostatitis   . Renal insufficiency    "Cr 1.6"    Past Surgical History:  Procedure Laterality Date  . ABLATION    . ATRIAL FIBRILLATION ABLATION N/A 09/28/2012   Procedure: ATRIAL FIBRILLATION ABLATION;  Surgeon: Thompson Grayer, MD;  Location: [facility] CATH LAB;  Service: Cardiovascular;  Laterality: N/A;  . atrial fibrillation ablation with CTI ablation  05/27/11, 09/28/12   ablation for afib and atrial flutter by Dr Rayann Heman  . BACK SURGERY  08/09/2016   C 5 6  C 6 to C 7 cervical fusion  . CARDIOVERSION  07/28/2011   Procedure: CARDIOVERSION;  Surgeon: Lelon Perla, MD;  Location: [facility];  Service: Cardiovascular;  Laterality: N/A;  . COLONOSCOPY WITH PROPOFOL N/A 03/03/2015   Procedure: COLONOSCOPY WITH PROPOFOL;  Surgeon:  Garlan Fair, MD;  Location: [facility];  Service: Gastroenterology;  Laterality: N/A;  . double j stent placement  06/2004   left  . ESOPHAGOGASTRODUODENOSCOPY (EGD) WITH PROPOFOL N/A 03/03/2015   Procedure: ESOPHAGOGASTRODUODENOSCOPY (EGD) WITH PROPOFOL;  Surgeon: Garlan Fair, MD;  Location: [facility];  Service: Gastroenterology;  Laterality: N/A;  . ESOPHAGOGASTRODUODENOSCOPY (EGD) WITH PROPOFOL N/A 10/25/2016   Procedure: ESOPHAGOGASTRODUODENOSCOPY (EGD) WITH PROPOFOL;  Surgeon: Garlan Fair, MD;  Location: [facility];  Service: Endoscopy;  Laterality: N/A;  . INGUINAL HERNIA REPAIR     "as a child; ? right"  . INGUINAL HERNIA REPAIR Left 06/23/2016   Procedure: OPEN LEFT INGUINAL HERNIA  REPAIR WITH MESH;  Surgeon: Johnathan Hausen, MD;  Location: [facility];  Service: General;  Laterality: Left;  . INSERTION OF MESH Left 06/23/2016   Procedure: INSERTION OF MESH;  Surgeon: Johnathan Hausen, MD;  Location: [facility];  Service: General;  Laterality: Left;  . LUNG BIOPSY  1993  . NASAL SEPTOPLASTY W/ TURBINOPLASTY  07/2006  . prostate biopsy  2011  . schwannoma removal  ~01/2010   left thorax  . TEE WITHOUT CARDIOVERSION N/A 09/27/2012   Procedure: TRANSESOPHAGEAL ECHOCARDIOGRAM (TEE);  Surgeon: Larey Dresser, MD;  Location: [facility];  Service: Cardiovascular;  Laterality: N/A;  .  UMBILICAL HERNIA REPAIR  07/2006    No outpatient medications have been marked as taking for the 07/18/18 encounter (Office Visit) with Deboraha Sprang, MD.    Allergies  Allergen Reactions  . Aldactone [Spironolactone]     Breast tissue enlargement  . Codeine Other (See Comments)    Headache  headache  . Lisinopril Cough and Other (See Comments)  . Nexium [Esomeprazole Magnesium] Other (See Comments)    Diarrhea   . Sulfa Antibiotics Rash  . Sulfa Drugs Cross Reactors Rash  . Sulfamethoxazole Rash      Review of Systems negative except from HPI and  PMH  Physical Exam BP 148/10 Well developed and well nourished in no acute distress HENT normal E scleral and icterus clear Neck Supple Regular rate and rhythm, no murmurs gallops or rub No clubbing cyanosis   Edema Alert and oriented, grossly normal motor and sensory function Skin Warm and Dry  ECG as noted above  Assessment and  Plan  Abnormal ECG  Dyspnea on exertion  REnal insufficiency Gr 3  anticoagulation intermittent  AF s/p Ablation    Atrial fibrillation is been quiescent.  Dyspnea mechanism is not clear.  VQ scan was low probability pretest probability was moderate.  Renal insufficiency precluded CTA.  His ECG shows new inferolateral Q waves from 2017.  Echo preliminarily demonstrates no significant wall motion abnormality; however, I reviewed with Dr. Greggory Brandy and Dr. Marolyn Hammock load from catheterization can be better controlled then with CTA and the abnormal ECG with his symptoms begs resolution of the presence or absence of coronary disease.  Hence, we will undertake catheterization.  Reviewed risks and benefits.  Will be scheduled for next Tuesday.  Current medicines are reviewed at length with the patient today .  The patient does not  have concerns regarding medicines.

## 2018-07-20 ENCOUNTER — Encounter (INDEPENDENT_AMBULATORY_CARE_PROVIDER_SITE_OTHER): Payer: Self-pay

## 2018-07-20 ENCOUNTER — Other Ambulatory Visit: Payer: Managed Care, Other (non HMO) | Admitting: *Deleted

## 2018-07-20 DIAGNOSIS — R9439 Abnormal result of other cardiovascular function study: Secondary | ICD-10-CM

## 2018-07-21 LAB — CBC
Hematocrit: 42 % (ref 37.5–51.0)
Hemoglobin: 13.8 g/dL (ref 13.0–17.7)
MCH: 28.6 pg (ref 26.6–33.0)
MCHC: 32.9 g/dL (ref 31.5–35.7)
MCV: 87 fL (ref 79–97)
Platelets: 269 10*3/uL (ref 150–450)
RBC: 4.83 x10E6/uL (ref 4.14–5.80)
RDW: 15.2 % (ref 12.3–15.4)
WBC: 11.4 10*3/uL — AB (ref 3.4–10.8)

## 2018-07-21 LAB — BASIC METABOLIC PANEL
BUN/Creatinine Ratio: 15 (ref 10–24)
BUN: 23 mg/dL (ref 8–27)
CO2: 21 mmol/L (ref 20–29)
Calcium: 9.4 mg/dL (ref 8.6–10.2)
Chloride: 101 mmol/L (ref 96–106)
Creatinine, Ser: 1.49 mg/dL — ABNORMAL HIGH (ref 0.76–1.27)
GFR calc Af Amer: 57 mL/min/{1.73_m2} — ABNORMAL LOW (ref >59)
GFR calc non Af Amer: 49 mL/min/{1.73_m2} — ABNORMAL LOW (ref >59)
GLUCOSE: 74 mg/dL (ref 65–99)
Potassium: 3.8 mmol/L (ref 3.5–5.2)
Sodium: 136 mmol/L (ref 134–144)

## 2018-07-25 ENCOUNTER — Encounter (HOSPITAL_COMMUNITY): Payer: Self-pay | Admitting: Interventional Cardiology

## 2018-07-25 ENCOUNTER — Other Ambulatory Visit: Payer: Self-pay

## 2018-07-25 ENCOUNTER — Encounter (HOSPITAL_COMMUNITY)
Admission: RE | Disposition: A | Discharge status: Home / Self Care | Payer: Self-pay | Source: Home / Self Care | Attending: Interventional Cardiology

## 2018-07-25 ENCOUNTER — Ambulatory Visit (HOSPITAL_COMMUNITY)
Admission: RE | Admit: 2018-07-25 | Discharge: 2018-07-25 | Disposition: A | Discharge status: Home / Self Care | Payer: Managed Care, Other (non HMO) | Attending: Interventional Cardiology | Admitting: Interventional Cardiology

## 2018-07-25 DIAGNOSIS — N281 Cyst of kidney, acquired: Secondary | ICD-10-CM | POA: Diagnosis not present

## 2018-07-25 DIAGNOSIS — R0609 Other forms of dyspnea: Secondary | ICD-10-CM | POA: Insufficient documentation

## 2018-07-25 DIAGNOSIS — I10 Essential (primary) hypertension: Secondary | ICD-10-CM | POA: Insufficient documentation

## 2018-07-25 DIAGNOSIS — Z882 Allergy status to sulfonamides status: Secondary | ICD-10-CM | POA: Insufficient documentation

## 2018-07-25 DIAGNOSIS — I4891 Unspecified atrial fibrillation: Secondary | ICD-10-CM | POA: Diagnosis not present

## 2018-07-25 DIAGNOSIS — I4892 Unspecified atrial flutter: Secondary | ICD-10-CM | POA: Diagnosis present

## 2018-07-25 DIAGNOSIS — K219 Gastro-esophageal reflux disease without esophagitis: Secondary | ICD-10-CM | POA: Insufficient documentation

## 2018-07-25 DIAGNOSIS — M1990 Unspecified osteoarthritis, unspecified site: Secondary | ICD-10-CM | POA: Diagnosis not present

## 2018-07-25 DIAGNOSIS — R7303 Prediabetes: Secondary | ICD-10-CM | POA: Insufficient documentation

## 2018-07-25 DIAGNOSIS — E269 Hyperaldosteronism, unspecified: Secondary | ICD-10-CM | POA: Insufficient documentation

## 2018-07-25 DIAGNOSIS — N289 Disorder of kidney and ureter, unspecified: Secondary | ICD-10-CM | POA: Diagnosis not present

## 2018-07-25 DIAGNOSIS — R9431 Abnormal electrocardiogram [ECG] [EKG]: Secondary | ICD-10-CM | POA: Diagnosis not present

## 2018-07-25 DIAGNOSIS — R9439 Abnormal result of other cardiovascular function study: Secondary | ICD-10-CM

## 2018-07-25 HISTORY — PX: ULTRASOUND GUIDANCE FOR VASCULAR ACCESS: SHX6516

## 2018-07-25 HISTORY — PX: LEFT HEART CATH AND CORONARY ANGIOGRAPHY: CATH118249

## 2018-07-25 SURGERY — LEFT HEART CATH AND CORONARY ANGIOGRAPHY
Anesthesia: LOCAL

## 2018-07-25 MED ORDER — MIDAZOLAM HCL 2 MG/2ML IJ SOLN
INTRAMUSCULAR | Status: AC
  Filled 2018-07-25: qty 2

## 2018-07-25 MED ORDER — MIDAZOLAM HCL 2 MG/2ML IJ SOLN
INTRAMUSCULAR | Status: DC | PRN
  Administered 2018-07-25: 0.5 mg via INTRAVENOUS
  Administered 2018-07-25: 1 mg via INTRAVENOUS

## 2018-07-25 MED ORDER — VERAPAMIL HCL 2.5 MG/ML IV SOLN
INTRAVENOUS | Status: DC | PRN
  Administered 2018-07-25: 11:00:00 via INTRA_ARTERIAL

## 2018-07-25 MED ORDER — ASPIRIN 81 MG PO CHEW: 81.0000 mg | CHEWABLE_TABLET | ORAL | Status: DC

## 2018-07-25 MED ORDER — ACETAMINOPHEN 325 MG PO TABS: 650.0000 mg | ORAL_TABLET | ORAL | Status: DC | PRN

## 2018-07-25 MED ORDER — SODIUM CHLORIDE 0.9% FLUSH: 3.0000 mL | INTRAVENOUS | Status: DC | PRN

## 2018-07-25 MED ORDER — FENTANYL CITRATE (PF) 100 MCG/2ML IJ SOLN
INTRAMUSCULAR | Status: AC
  Filled 2018-07-25: qty 2

## 2018-07-25 MED ORDER — SODIUM CHLORIDE 0.9 % IV SOLN: 250.0000 mL | INTRAVENOUS | Status: DC | PRN

## 2018-07-25 MED ORDER — LIDOCAINE HCL (PF) 1 % IJ SOLN
INTRAMUSCULAR | Status: DC | PRN
  Administered 2018-07-25: 2 mL

## 2018-07-25 MED ORDER — FENTANYL CITRATE (PF) 100 MCG/2ML IJ SOLN
INTRAMUSCULAR | Status: DC | PRN
  Administered 2018-07-25 (×2): 25 ug via INTRAVENOUS

## 2018-07-25 MED ORDER — SODIUM CHLORIDE 0.9 % IV SOLN: INTRAVENOUS | Status: DC

## 2018-07-25 MED ORDER — ONDANSETRON HCL 4 MG/2ML IJ SOLN: 4.0000 mg | Freq: Four times a day (QID) | INTRAMUSCULAR | Status: DC | PRN

## 2018-07-25 MED ORDER — SODIUM CHLORIDE 0.9 % WEIGHT BASED INFUSION
3.0000 mL/kg/h | INTRAVENOUS | Status: DC
  Administered 2018-07-25: 3 mL/kg/h via INTRAVENOUS

## 2018-07-25 MED ORDER — LIDOCAINE HCL (PF) 1 % IJ SOLN
INTRAMUSCULAR | Status: AC
  Filled 2018-07-25: qty 30

## 2018-07-25 MED ORDER — HEPARIN (PORCINE) IN NACL 1000-0.9 UT/500ML-% IV SOLN
INTRAVENOUS | Status: AC
  Filled 2018-07-25: qty 1000

## 2018-07-25 MED ORDER — SODIUM CHLORIDE 0.9% FLUSH: 3.0000 mL | Freq: Two times a day (BID) | INTRAVENOUS | Status: DC

## 2018-07-25 MED ORDER — VERAPAMIL HCL 2.5 MG/ML IV SOLN
INTRAVENOUS | Status: AC
  Filled 2018-07-25: qty 2

## 2018-07-25 MED ORDER — SODIUM CHLORIDE 0.9 % WEIGHT BASED INFUSION: 1.0000 mL/kg/h | INTRAVENOUS | Status: DC

## 2018-07-25 MED ORDER — HEPARIN SODIUM (PORCINE) 1000 UNIT/ML IJ SOLN
INTRAMUSCULAR | Status: AC
  Filled 2018-07-25: qty 1

## 2018-07-25 MED ORDER — HEPARIN (PORCINE) IN NACL 1000-0.9 UT/500ML-% IV SOLN
INTRAVENOUS | Status: DC | PRN
  Administered 2018-07-25: 500 mL

## 2018-07-25 MED ORDER — IOHEXOL 350 MG/ML SOLN
INTRAVENOUS | Status: DC | PRN
  Administered 2018-07-25: 95 mL via INTRA_ARTERIAL

## 2018-07-25 MED ORDER — HEPARIN SODIUM (PORCINE) 1000 UNIT/ML IJ SOLN
INTRAMUSCULAR | Status: DC | PRN
  Administered 2018-07-25: 5000 [IU] via INTRAVENOUS

## 2018-07-25 SURGICAL SUPPLY — 14 items
CATH INFINITI 5 FR JL3.5 (CATHETERS) ×1 IMPLANT
CATH INFINITI 5FR AL1 (CATHETERS) ×1 IMPLANT
CATH INFINITI JR4 5F (CATHETERS) ×1 IMPLANT
CATH LAUNCHER 5F RADR (CATHETERS) IMPLANT
CATHETER LAUNCHER 5F RADR (CATHETERS) ×3
DEVICE RAD COMP TR BAND LRG (VASCULAR PRODUCTS) ×1 IMPLANT
GLIDESHEATH SLEND A-KIT 6F 22G (SHEATH) ×1 IMPLANT
GUIDEWIRE INQWIRE 1.5J.035X260 (WIRE) IMPLANT
INQWIRE 1.5J .035X260CM (WIRE) ×3
KIT HEART LEFT (KITS) ×3 IMPLANT
PACK CARDIAC CATHETERIZATION (CUSTOM PROCEDURE TRAY) ×3 IMPLANT
SHEATH PROBE COVER 6X72 (BAG) ×1 IMPLANT
TRANSDUCER W/STOPCOCK (MISCELLANEOUS) ×3 IMPLANT
TUBING CIL FLEX 10 FLL-RA (TUBING) ×3 IMPLANT

## 2018-07-25 NOTE — CV Procedure (Signed)
   Right radial access using real-time ultrasound guidance.  Normal coronary arteries.  Left dominant coronary anatomy.  Normal LVEDP.

## 2018-07-25 NOTE — Interval H&P Note (Signed)
Cath Lab Visit (complete for each Cath Lab visit)  Clinical Evaluation Leading to the Procedure:   ACS: No.  Non-ACS:    Anginal Classification: CCS II  Anti-ischemic medical therapy: Minimal Therapy (1 class of medications)  Non-Invasive Test Results: No non-invasive testing performed  Prior CABG: No previous CABG      History and Physical Interval Note:  07/25/2018 73:66 AM  Casey [name]  has presented today for surgery, with the diagnosis of Abnormal ST  The various methods of treatment have been discussed with the patient and family. After consideration of risks, benefits and other options for treatment, the patient has consented to  Procedure(s): LEFT HEART CATH AND CORONARY ANGIOGRAPHY (N/A) as a surgical intervention .  The patient's history has been reviewed, patient examined, no change in status, stable for surgery.  I have reviewed the patient's chart and labs.  Questions were answered to the patient's satisfaction.     Belva Crome III

## 2018-07-25 NOTE — Discharge Instructions (Signed)
Drink plenty of fluids over next 48 hours and keep right wrist elevated at heart level for 24 hours May resume Xarelto 07/26/18 Hold Metformin 48 hours   Radial Site Care  This sheet gives you information about how to care for yourself after your procedure. Your health care provider may also give you more specific instructions. If you have problems or questions, contact your health care provider. What can I expect after the procedure? After the procedure, it is common to have:  Bruising and tenderness at the catheter insertion area. Follow these instructions at home: Medicines  Take over-the-counter and prescription medicines only as told by your health care provider. Insertion site care  Follow instructions from your health care provider about how to take care of your insertion site. Make sure you: ? Wash your hands with soap and water before you change your bandage (dressing). If soap and water are not available, use hand sanitizer. ? Change your dressing as told by your health care provider. ? Leave stitches (sutures), skin glue, or adhesive strips in place. These skin closures may need to stay in place for 2 weeks or longer. If adhesive strip edges start to loosen and curl up, you may trim the loose edges. Do not remove adhesive strips completely unless your health care provider tells you to do that.  Check your insertion site every day for signs of infection. Check for: ? Redness, swelling, or pain. ? Fluid or blood. ? Pus or a bad smell. ? Warmth.  Do not take baths, swim, or use a hot tub until your health care provider approves.  You may shower 24-48 hours after the procedure, or as directed by your health care provider. ? Remove the dressing and gently wash the site with plain soap and water. ? Pat the area dry with a clean towel. ? Do not rub the site. That could cause bleeding.  Do not apply powder or lotion to the site. Activity   For 24 hours after the procedure, or  as directed by your health care provider: ? Do not flex or bend the affected arm. ? Do not push or pull heavy objects with the affected arm. ? Do not drive yourself home from the hospital or clinic. You may drive 24 hours after the procedure unless your health care provider tells you not to. ? Do not operate machinery or power tools.  Do not lift anything that is heavier than 10 lb (4.5 kg), or the limit that you are told, until your health care provider says that it is safe.  Ask your health care provider when it is okay to: ? Return to work or school. ? Resume usual physical activities or sports. ? Resume sexual activity. General instructions  If the catheter site starts to bleed, raise your arm and put firm pressure on the site. If the bleeding does not stop, get help right away. This is a medical emergency.  If you went home on the same day as your procedure, a responsible adult should be with you for the first 24 hours after you arrive home.  Keep all follow-up visits as told by your health care provider. This is important. Contact a health care provider if:  You have a fever.  You have redness, swelling, or yellow drainage around your insertion site. Get help right away if:  You have unusual pain at the radial site.  The catheter insertion area swells very fast.  The insertion area is bleeding, and the bleeding  does not stop when you hold steady pressure on the area.  Your arm or hand becomes pale, cool, tingly, or numb. These symptoms may represent a serious problem that is an emergency. Do not wait to see if the symptoms will go away. Get medical help right away. Call your local emergency services (911 in the [place] Do not drive yourself to the hospital. Summary  After the procedure, it is common to have bruising and tenderness at the site.  Follow instructions from your health care provider about how to take care of your radial site wound. Check the wound every day for  signs of infection.  Do not lift anything that is heavier than 10 lb (4.5 kg), or the limit that you are told, until your health care provider says that it is safe. This information is not intended to replace advice given to you by your health care provider. Make sure you discuss any questions you have with your health care provider. Document Released: 08/14/2010 Document Revised: 08/17/2017 Document Reviewed: 08/17/2017 Elsevier Interactive Patient Education  2019 [employer].

## 2018-08-01 ENCOUNTER — Ambulatory Visit: Payer: Managed Care, Other (non HMO)

## 2018-08-04 ENCOUNTER — Ambulatory Visit
Admission: RE | Admit: 2018-08-04 | Discharge: 2018-08-04 | Disposition: A | Discharge status: Home / Self Care | Payer: Managed Care, Other (non HMO) | Source: Ambulatory Visit | Attending: Internal Medicine | Admitting: Internal Medicine

## 2018-08-04 ENCOUNTER — Other Ambulatory Visit: Payer: Self-pay | Admitting: Internal Medicine

## 2018-08-04 DIAGNOSIS — R509 Fever, unspecified: Secondary | ICD-10-CM

## 2018-08-06 ENCOUNTER — Other Ambulatory Visit (HOSPITAL_COMMUNITY): Payer: Self-pay | Admitting: Internal Medicine

## 2018-08-07 NOTE — Telephone Encounter (Signed)
Pt last saw Dr Caryl Comes 07/18/18, last labs 07/20/18 Creat 1.49, age 63, weight 108.1kg, CrCl 76.58, based on CrCl pt is on appropriate dosage of Xarelto 20mg  QD.  Will refill rx.

## 2018-08-11 ENCOUNTER — Ambulatory Visit: Payer: Self-pay | Admitting: Internal Medicine

## 2018-08-16 ENCOUNTER — Inpatient Hospital Stay (HOSPITAL_COMMUNITY)
Admission: EM | Admit: 2018-08-16 | Discharge: 2018-08-19 | DRG: 193 | Disposition: A | Discharge status: Home / Self Care | Payer: Managed Care, Other (non HMO) | Attending: Internal Medicine | Admitting: Internal Medicine

## 2018-08-16 ENCOUNTER — Other Ambulatory Visit: Payer: Self-pay

## 2018-08-16 ENCOUNTER — Emergency Department (HOSPITAL_COMMUNITY): Payer: Managed Care, Other (non HMO)

## 2018-08-16 DIAGNOSIS — E269 Hyperaldosteronism, unspecified: Secondary | ICD-10-CM | POA: Diagnosis present

## 2018-08-16 DIAGNOSIS — R911 Solitary pulmonary nodule: Secondary | ICD-10-CM | POA: Diagnosis present

## 2018-08-16 DIAGNOSIS — N182 Chronic kidney disease, stage 2 (mild): Secondary | ICD-10-CM

## 2018-08-16 DIAGNOSIS — Z7984 Long term (current) use of oral hypoglycemic drugs: Secondary | ICD-10-CM

## 2018-08-16 DIAGNOSIS — J181 Lobar pneumonia, unspecified organism: Principal | ICD-10-CM | POA: Diagnosis present

## 2018-08-16 DIAGNOSIS — E785 Hyperlipidemia, unspecified: Secondary | ICD-10-CM | POA: Diagnosis present

## 2018-08-16 DIAGNOSIS — R0902 Hypoxemia: Secondary | ICD-10-CM

## 2018-08-16 DIAGNOSIS — R7303 Prediabetes: Secondary | ICD-10-CM | POA: Diagnosis present

## 2018-08-16 DIAGNOSIS — I272 Pulmonary hypertension, unspecified: Secondary | ICD-10-CM | POA: Diagnosis present

## 2018-08-16 DIAGNOSIS — Z7901 Long term (current) use of anticoagulants: Secondary | ICD-10-CM

## 2018-08-16 DIAGNOSIS — E876 Hypokalemia: Secondary | ICD-10-CM | POA: Diagnosis present

## 2018-08-16 DIAGNOSIS — N281 Cyst of kidney, acquired: Secondary | ICD-10-CM | POA: Diagnosis present

## 2018-08-16 DIAGNOSIS — J9601 Acute respiratory failure with hypoxia: Secondary | ICD-10-CM | POA: Diagnosis present

## 2018-08-16 DIAGNOSIS — K219 Gastro-esophageal reflux disease without esophagitis: Secondary | ICD-10-CM | POA: Diagnosis present

## 2018-08-16 DIAGNOSIS — J189 Pneumonia, unspecified organism: Secondary | ICD-10-CM | POA: Diagnosis present

## 2018-08-16 DIAGNOSIS — J9600 Acute respiratory failure, unspecified whether with hypoxia or hypercapnia: Secondary | ICD-10-CM

## 2018-08-16 DIAGNOSIS — I129 Hypertensive chronic kidney disease with stage 1 through stage 4 chronic kidney disease, or unspecified chronic kidney disease: Secondary | ICD-10-CM | POA: Diagnosis present

## 2018-08-16 DIAGNOSIS — I480 Paroxysmal atrial fibrillation: Secondary | ICD-10-CM | POA: Diagnosis present

## 2018-08-16 DIAGNOSIS — Z8701 Personal history of pneumonia (recurrent): Secondary | ICD-10-CM

## 2018-08-16 DIAGNOSIS — I4891 Unspecified atrial fibrillation: Secondary | ICD-10-CM | POA: Diagnosis present

## 2018-08-16 DIAGNOSIS — N183 Chronic kidney disease, stage 3 unspecified: Secondary | ICD-10-CM

## 2018-08-16 DIAGNOSIS — R739 Hyperglycemia, unspecified: Secondary | ICD-10-CM | POA: Diagnosis present

## 2018-08-16 DIAGNOSIS — I10 Essential (primary) hypertension: Secondary | ICD-10-CM | POA: Diagnosis present

## 2018-08-16 DIAGNOSIS — J9611 Chronic respiratory failure with hypoxia: Secondary | ICD-10-CM | POA: Diagnosis present

## 2018-08-16 DIAGNOSIS — Z79899 Other long term (current) drug therapy: Secondary | ICD-10-CM

## 2018-08-16 DIAGNOSIS — F17290 Nicotine dependence, other tobacco product, uncomplicated: Secondary | ICD-10-CM | POA: Diagnosis present

## 2018-08-16 LAB — BRAIN NATRIURETIC PEPTIDE: B Natriuretic Peptide: 48.7 pg/mL (ref 0.0–100.0)

## 2018-08-16 LAB — CBC WITH DIFFERENTIAL/PLATELET
ABS IMMATURE GRANULOCYTES: 0.19 10*3/uL — AB (ref 0.00–0.07)
Basophils Absolute: 0.1 10*3/uL (ref 0.0–0.1)
Basophils Relative: 1 %
Eosinophils Absolute: 0.1 10*3/uL (ref 0.0–0.5)
Eosinophils Relative: 1 %
HCT: 42.7 % (ref 39.0–52.0)
Hemoglobin: 13.7 g/dL (ref 13.0–17.0)
Immature Granulocytes: 1 %
LYMPHS ABS: 2.1 10*3/uL (ref 0.7–4.0)
Lymphocytes Relative: 16 %
MCH: 28.1 pg (ref 26.0–34.0)
MCHC: 32.1 g/dL (ref 30.0–36.0)
MCV: 87.7 fL (ref 80.0–100.0)
Monocytes Absolute: 0.9 10*3/uL (ref 0.1–1.0)
Monocytes Relative: 7 %
Neutro Abs: 10.1 10*3/uL — ABNORMAL HIGH (ref 1.7–7.7)
Neutrophils Relative %: 74 %
Platelets: 239 10*3/uL (ref 150–400)
RBC: 4.87 MIL/uL (ref 4.22–5.81)
RDW: 15.9 % — ABNORMAL HIGH (ref 11.5–15.5)
WBC: 13.5 10*3/uL — ABNORMAL HIGH (ref 4.0–10.5)
nRBC: 0 % (ref 0.0–0.2)

## 2018-08-16 LAB — BASIC METABOLIC PANEL
Anion gap: 11 (ref 5–15)
BUN: 27 mg/dL — ABNORMAL HIGH (ref 8–23)
CO2: 24 mmol/L (ref 22–32)
Calcium: 9 mg/dL (ref 8.9–10.3)
Chloride: 103 mmol/L (ref 98–111)
Creatinine, Ser: 1.68 mg/dL — ABNORMAL HIGH (ref 0.61–1.24)
GFR calc Af Amer: 49 mL/min — ABNORMAL LOW (ref >60)
GFR calc non Af Amer: 42 mL/min — ABNORMAL LOW (ref >60)
Glucose, Bld: 112 mg/dL — ABNORMAL HIGH (ref 70–99)
Potassium: 3.4 mmol/L — ABNORMAL LOW (ref 3.5–5.1)
Sodium: 138 mmol/L (ref 135–145)

## 2018-08-16 LAB — INFLUENZA PANEL BY PCR (TYPE A & B)
INFLAPCR: NEGATIVE
Influenza B By PCR: NEGATIVE

## 2018-08-16 LAB — I-STAT TROPONIN, ED: Troponin i, poc: 0.01 ng/mL (ref 0.00–0.08)

## 2018-08-16 MED ORDER — DILTIAZEM HCL ER COATED BEADS 180 MG PO CP24
360.0000 mg | ORAL_CAPSULE | Freq: Every evening | ORAL | Status: DC
  Administered 2018-08-16 – 2018-08-19 (×3): 360 mg via ORAL
  Filled 2018-08-16: qty 1
  Filled 2018-08-16 (×2): qty 2
  Filled 2018-08-16 (×2): qty 1

## 2018-08-16 MED ORDER — PIPERACILLIN-TAZOBACTAM 3.375 G IVPB 30 MIN
3.3750 g | Freq: Once | INTRAVENOUS | Status: AC
  Administered 2018-08-16: 3.375 g via INTRAVENOUS
  Filled 2018-08-16: qty 50

## 2018-08-16 MED ORDER — SERTRALINE HCL 50 MG PO TABS
50.0000 mg | ORAL_TABLET | Freq: Every evening | ORAL | Status: DC
  Administered 2018-08-16 – 2018-08-19 (×3): 50 mg via ORAL
  Filled 2018-08-16 (×4): qty 1

## 2018-08-16 MED ORDER — ALLOPURINOL 100 MG PO TABS
100.0000 mg | ORAL_TABLET | Freq: Two times a day (BID) | ORAL | Status: DC
  Administered 2018-08-16 – 2018-08-19 (×6): 100 mg via ORAL
  Filled 2018-08-16 (×6): qty 1

## 2018-08-16 MED ORDER — HYDROCHLOROTHIAZIDE 12.5 MG PO CAPS
12.5000 mg | ORAL_CAPSULE | Freq: Every evening | ORAL | Status: DC
  Administered 2018-08-16 – 2018-08-17 (×2): 12.5 mg via ORAL
  Filled 2018-08-16 (×2): qty 1

## 2018-08-16 MED ORDER — PIPERACILLIN-TAZOBACTAM 3.375 G IVPB
3.3750 g | Freq: Three times a day (TID) | INTRAVENOUS | Status: DC
  Administered 2018-08-17 (×2): 3.375 g via INTRAVENOUS
  Filled 2018-08-16 (×2): qty 50

## 2018-08-16 MED ORDER — EPLERENONE 25 MG PO TABS
50.0000 mg | ORAL_TABLET | Freq: Two times a day (BID) | ORAL | Status: DC
  Administered 2018-08-17 – 2018-08-19 (×5): 50 mg via ORAL
  Filled 2018-08-16 (×7): qty 2

## 2018-08-16 MED ORDER — RIVAROXABAN 20 MG PO TABS
20.0000 mg | ORAL_TABLET | Freq: Every day | ORAL | Status: DC
  Administered 2018-08-17 – 2018-08-19 (×2): 20 mg via ORAL
  Filled 2018-08-16 (×3): qty 1

## 2018-08-16 MED ORDER — LOSARTAN POTASSIUM 50 MG PO TABS
100.0000 mg | ORAL_TABLET | Freq: Every evening | ORAL | Status: DC
  Administered 2018-08-16 – 2018-08-19 (×3): 100 mg via ORAL
  Filled 2018-08-16 (×3): qty 2

## 2018-08-16 MED ORDER — PANTOPRAZOLE SODIUM 40 MG PO TBEC
40.0000 mg | DELAYED_RELEASE_TABLET | Freq: Every day | ORAL | Status: DC
  Administered 2018-08-16 – 2018-08-19 (×3): 40 mg via ORAL
  Filled 2018-08-16 (×3): qty 1

## 2018-08-16 MED ORDER — SODIUM CHLORIDE 0.9 % IV BOLUS
1000.0000 mL | Freq: Once | INTRAVENOUS | Status: AC
  Administered 2018-08-16: 1000 mL via INTRAVENOUS

## 2018-08-16 MED ORDER — BUDESONIDE 0.25 MG/2ML IN SUSP
0.2500 mg | Freq: Two times a day (BID) | RESPIRATORY_TRACT | Status: DC
  Administered 2018-08-17: 0.25 mg via RESPIRATORY_TRACT
  Filled 2018-08-16: qty 2

## 2018-08-16 MED ORDER — ATORVASTATIN CALCIUM 10 MG PO TABS
10.0000 mg | ORAL_TABLET | Freq: Every evening | ORAL | Status: DC
  Administered 2018-08-16 – 2018-08-19 (×3): 10 mg via ORAL
  Filled 2018-08-16 (×3): qty 1

## 2018-08-16 NOTE — H&P (Signed)
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  Patient reports travel to [place] in October 2019.  He had noticed swelling of his right leg and upon returning discussed with his PCP and had a RLE ultrasound completed 05/30/2018 which did not show a DVT but did show a superficial thrombophlebitis.  He had previously discontinued Xarelto after discussion with his cardiologist due to chadsvasc score of 1, which he subsequently resumed.  He went to the ED on 05/31/2018 for further evaluation.  A VQ scan was obtained which did not show evidence of focal ventilation or perfusion defect and was low probability for PE.  He was diagnosed and treated for bronchitis.  He had intermittent improvement.  He was seen by pulmonology, Dr. Valeta Harms, who ordered PFTs however was unable to complete them due to feeling unwell again.  He has since completed a 10-day course of Levaquin.  He was also evaluated by cardiology.  07/18/2018 echocardiogram showed EF 50-55% without regional wall abnormality and normal LV diastolic function parameters.  He completed an exercise tolerance test with hypertensive response to exercise.  His baseline EKG showed old infarction in the anterior lateral and inferior regions and he was therefore scheduled for a left heart cath.  LHC on 07/25/2018 was without any significant coronary artery disease.  The weekend prior to this admission began to feel unwell again with  shortness of breath.  And diaphoresis and a fever at home 2 days prior to admission.  He returned to work and noted significant dyspnea with exertion and occasionally with rest.  He had associated tachycardia without feeling prior atrial fibrillation symptoms and an associated nonproductive cough.  His symptoms worsen when walking to the car to the point that he brought himself to the emergency department.  He denies any significant tobacco history.  Reports a history of hantavirus years ago but denies any known history of asthma or COPD.  He reports recent travel to hike in [place] in mid December 2019.  ED Course:  Vitals showed BP 136/95, pulse 96, RR 34, temp 97.9 Fahrenheit, SPO2 92% on room air.  He was noted to desaturate to 87% on room air in the ED.  Lab work was notable for WBC 13.5, hemoglobin 13.7, platelets 239, potassium 3.4, BUN 27, creatinine 1.68, i-STAT troponin 0.01, influenza panel negative.  Portable chest x-ray was without acute consolidation, edema, or effusion.  CT chest without contrast showed a focal nodular masslike density at the right lung base suggestive of focal infection or neoplasm.  A 7 mm left upper lobe pulmonary nodule and bilateral renal cysts were noted.  He was given 1 L normal saline and started on IV Zosyn.  The hospitalist service was consulted to admit for further evaluation and management.  Review of Systems: As per HPI otherwise 10 point review of systems negative.    Past Medical History:  Diagnosis Date  . 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HCC)    ongoing evaluation for elevated R>L adosterone secretion  . Arthritis   .  Atrial enlargement, bilateral   . Atrial fibrillation ([place])   . Atrial fibrillation (HCC)    paroxysmal  . Atrial flutter ([place])   . Complication of anesthesia    could not urinate after UHR, needed cath, mental fog sinc cervical fusion 08-09-16   . Dysrhythmia    a-fib-had ablation x2  . GERD (gastroesophageal reflux  disease)   . Hyperaldosteronism ([place])   . Hypertension   . Kidney cysts    "multiple"  . Nephrolithiasis   . Pneumonia 1990's  . Pre-diabetes   . Prostatitis   . Renal insufficiency    "Cr 1.6"    Past Surgical History:  Procedure Laterality Date  . ABLATION    . ATRIAL FIBRILLATION ABLATION N/A 09/28/2012   Procedure: ATRIAL FIBRILLATION ABLATION;  Surgeon: Thompson Grayer, MD;  Location: [facility] CATH LAB;  Service: Cardiovascular;  Laterality: N/A;  . atrial fibrillation ablation with CTI ablation  05/27/11, 09/28/12   ablation for afib and atrial flutter by Dr Rayann Heman  . BACK SURGERY  08/09/2016   C 5 6  C 6 to C 7 cervical fusion  . CARDIOVERSION  07/28/2011   Procedure: CARDIOVERSION;  Surgeon: Lelon Perla, MD;  Location: [facility];  Service: Cardiovascular;  Laterality: N/A;  . COLONOSCOPY WITH PROPOFOL N/A 03/03/2015   Procedure: COLONOSCOPY WITH PROPOFOL;  Surgeon: Garlan Fair, MD;  Location: [facility] ENDOSCOPY;  Service: Gastroenterology;  Laterality: N/A;  . double j stent placement  06/2004   left  . ESOPHAGOGASTRODUODENOSCOPY (EGD) WITH PROPOFOL N/A 03/03/2015   Procedure: ESOPHAGOGASTRODUODENOSCOPY (EGD) WITH PROPOFOL;  Surgeon: Garlan Fair, MD;  Location: [facility] ENDOSCOPY;  Service: Gastroenterology;  Laterality: N/A;  . ESOPHAGOGASTRODUODENOSCOPY (EGD) WITH PROPOFOL N/A 10/25/2016   Procedure: ESOPHAGOGASTRODUODENOSCOPY (EGD) WITH PROPOFOL;  Surgeon: Garlan Fair, MD;  Location: [facility] ENDOSCOPY;  Service: Endoscopy;  Laterality: N/A;  . INGUINAL HERNIA REPAIR     "as a child; ? right"  . INGUINAL HERNIA REPAIR Left 06/23/2016   Procedure: OPEN LEFT INGUINAL HERNIA  REPAIR WITH MESH;  Surgeon: Johnathan Hausen, MD;  Location: [facility];  Service: General;  Laterality: Left;  . INSERTION OF MESH Left 06/23/2016   Procedure: INSERTION OF MESH;  Surgeon: Johnathan Hausen, MD;  Location: [facility];  Service: General;  Laterality: Left;  . LEFT HEART CATH AND  CORONARY ANGIOGRAPHY N/A 07/25/2018   Procedure: LEFT HEART CATH AND CORONARY ANGIOGRAPHY;  Surgeon: Belva Crome, MD;  Location: [facility] CV LAB;  Service: Cardiovascular;  Laterality: N/A;  . LUNG BIOPSY  1993  . NASAL SEPTOPLASTY W/ TURBINOPLASTY  07/2006  . prostate biopsy  2011  . schwannoma removal  ~01/2010   left thorax  . TEE WITHOUT CARDIOVERSION N/A 09/27/2012   Procedure: TRANSESOPHAGEAL ECHOCARDIOGRAM (TEE);  Surgeon: Larey Dresser, MD;  Location: [facility];  Service: Cardiovascular;  Laterality: N/A;  . ULTRASOUND GUIDANCE FOR VASCULAR ACCESS  07/25/2018   Procedure: Ultrasound Guidance For Vascular Access;  Surgeon: Belva Crome, MD;  Location: [facility] CV LAB;  Service: Cardiovascular;;  . UMBILICAL HERNIA REPAIR  07/2006     reports that he has never smoked. He has never used smokeless tobacco. He reports current alcohol use of about 2.0 standard drinks of alcohol per week. He reports that he does not use drugs.  Allergies  Allergen Reactions  . Aldactone [Spironolactone]     Breast tissue enlargement  . Codeine Other (See Comments)    Headache   .  Lisinopril Cough  . Nexium [Esomeprazole Magnesium] Diarrhea  . Sulfa Antibiotics Rash  . Sulfa Drugs Cross Reactors Rash    Family History  Problem Relation Age of Onset  . Atrial fibrillation Other        sister has had 2 afib ablations  . Atrial fibrillation Sister      Prior to Admission medications   Medication Sig Start Date End Date Taking? Authorizing Provider  allopurinol (ZYLOPRIM) 100 MG tablet Take 100 mg by mouth 2 (two) times daily.    Yes [provider]  atorvastatin (LIPITOR) 10 MG tablet Take 10 mg by mouth every evening.    Yes [provider]  beclomethasone (QVAR) 80 MCG/ACT inhaler Inhale 1 puff into the lungs 2 (two) times daily.   Yes [provider]  diltiazem (CARDIZEM CD) 360 MG 24 hr capsule Take 360 mg by mouth every evening. 10/04/16  Yes [provider]  eplerenone (INSPRA) 50 MG tablet Take 50 mg by mouth 2 (two) times daily.   Yes [provider]  FIBER ADULT GUMMIES PO Take 2 tablets by mouth 2 (two) times daily.    Yes [provider]  hydrochlorothiazide (MICROZIDE) 12.5 MG capsule Take 12.5 mg by mouth every evening.    Yes [provider]  ibuprofen (ADVIL,MOTRIN) 200 MG tablet Take 200-400 mg by mouth daily as needed for headache or moderate pain.    Yes [provider]  levofloxacin (LEVAQUIN) 500 MG tablet Take 500 mg by mouth daily. Started on 08-14-18. For 7 days   Yes [provider]  loperamide (IMODIUM A-D) 2 MG tablet Take 2 mg by mouth 2 (two) times daily.   Yes [provider]  losartan (COZAAR) 100 MG tablet Take 100 mg by mouth every evening.   Yes [provider]  metFORMIN (GLUCOPHAGE-XR) 500 MG 24 hr tablet Take 500 mg by mouth 2 (two) times daily.   Yes [provider]  omeprazole (PRILOSEC) 40 MG capsule Take 40 mg by mouth daily before supper.    Yes [provider]  oxybutynin (DITROPAN-XL) 10 MG 24 hr tablet Take 10 mg by mouth daily. 01/28/18  Yes [provider]  Potassium 99 MG TABS Take 198 mg by mouth 2 (two) times daily.   Yes [provider]  sertraline (ZOLOFT) 50 MG tablet Take 1 tablet (50 mg total) by mouth every evening. 12/07/17  Yes Eksir, Richard Miu, MD  vitamin B-12 (CYANOCOBALAMIN) 1000 MCG tablet Take 1,000 mcg by mouth daily.   Yes [provider]  CIALIS 20 MG tablet Take 20 mg by mouth daily as needed for erectile dysfunction.  09/23/16   [provider]  XARELTO 20 MG TABS tablet TAKE 1 TABLET(20 MG) BY MOUTH DAILY WITH SUPPER 08/07/18   Thompson Grayer, MD    Physical Exam: Vitals:   08/16/18 2215 08/16/18 2230 08/16/18 2245 08/16/18 2311  BP: 127/75 121/80 127/86 105/81  Pulse: 88 84 79 84  Resp: 16 14 17 17   Temp:    (!) 97.4 F (36.3 C)  TempSrc:    Oral  SpO2:  98% 96% 95% 95%    Constitutional: NAD, calm, comfortable Eyes: lids and conjunctivae normal ENMT: Mucous membranes are moist. Posterior pharynx clear of any exudate or lesions. Normal dentition.  Neck: normal, supple, no masses. Respiratory: fain inspiratory crackles left lower lung field, otherwise clear. Normal respiratory effort. No accessory muscle use.  Cardiovascular: Regular rate and rhythm, no murmurs /  rubs / gallops. No extremity edema. Abdomen: no tenderness, no masses palpated. Musculoskeletal: no clubbing / cyanosis. No joint deformity upper and lower extremities. Good ROM, no contractures. Normal muscle tone.  Skin: no rashes, lesions, ulcers. No induration Neurologic: CN 2-12 grossly intact. Sensation and strength intact. Psychiatric: Normal judgment and insight. Alert and oriented x 3. Normal mood.    Labs on Admission: I have personally reviewed following labs and imaging studies  CBC: Recent Labs  Lab 08/16/18 1815  WBC 13.5*  NEUTROABS 10.1*  HGB 13.7  HCT 42.7  MCV 87.7  PLT 814   Basic Metabolic Panel: Recent Labs  Lab 08/16/18 1815  NA 138  K 3.4*  CL 103  CO2 24  GLUCOSE 112*  BUN 27*  CREATININE 1.68*  CALCIUM 9.0   GFR: CrCl cannot be calculated (Unknown ideal weight.). Liver Function Tests: No results for input(s): AST, ALT, ALKPHOS, BILITOT, PROT, ALBUMIN in the last 168 hours. No results for input(s): LIPASE, AMYLASE in the last 168 hours. No results for input(s): AMMONIA in the last 168 hours. Coagulation Profile: No results for input(s): INR, PROTIME in the last 168 hours. Cardiac Enzymes: No results for input(s): CKTOTAL, CKMB, CKMBINDEX, TROPONINI in the last 168 hours. BNP (last 3 results) No results for input(s): PROBNP in the last 8760 hours. HbA1C: No results for input(s): HGBA1C in the last 72 hours. CBG: No results for input(s): GLUCAP in the last 168 hours. Lipid Profile: No results for input(s): CHOL, HDL, LDLCALC,  TRIG, CHOLHDL, LDLDIRECT in the last 72 hours. Thyroid Function Tests: No results for input(s): TSH, T4TOTAL, FREET4, T3FREE, THYROIDAB in the last 72 hours. Anemia Panel: No results for input(s): VITAMINB12, FOLATE, FERRITIN, TIBC, IRON, RETICCTPCT in the last 72 hours. Urine analysis:    Component Value Date/Time   COLORURINE YELLOW 01/20/2010 [place] 01/20/2010 0859   LABSPEC 1.014 01/20/2010 0859   PHURINE 5.5 01/20/2010 0859   GLUCOSEU NEGATIVE 01/20/2010 0859   HGBUR NEGATIVE 01/20/2010 0859   BILIRUBINUR NEGATIVE 01/20/2010 0859   KETONESUR NEGATIVE 01/20/2010 0859   PROTEINUR NEGATIVE 01/20/2010 0859   UROBILINOGEN 0.2 01/20/2010 0859   NITRITE NEGATIVE 01/20/2010 0859   LEUKOCYTESUR TRACE (A) 01/20/2010 0859    Radiological Exams on Admission: Ct Chest Wo Contrast  Result Date: 08/16/2018 CLINICAL DATA:  63 year old male with acute respiratory illness. EXAM: CT CHEST WITHOUT CONTRAST TECHNIQUE: Multidetector CT imaging of the chest was performed following the standard protocol without IV contrast. COMPARISON:  Chest radiograph dated 08/16/2018 and chest CT dated 09/18/2012 FINDINGS: Evaluation of this exam is limited in the absence of intravenous contrast. Cardiovascular: There is no cardiomegaly or pericardial effusion. There is coronary vascular calcification. Mild atherosclerotic calcification of the thoracic aorta. There is mild dilatation of the main pulmonary trunk suggestive of pulmonary hypertension. Clinical correlation is recommended. Mediastinum/Nodes: There is no hilar or mediastinal adenopathy. The esophagus is grossly unremarkable. No mediastinal fluid collection. Lungs/Pleura: There is a 3.3 x 1.8 cm focal nodular masslike density at the right lung base posteriorly along the posterior costophrenic angle (series 4, image 136). This is new since the CT of 2014. Although this may represent a focal area of infection, a neoplastic process is not excluded.  If there is clinical signs of infection follow-up after treatment recommended. If there is no clinical findings of infection or this area persist after treatment, tissue sampling may be warranted. There is a 7 mm nodule in the left upper lobe. Left lung base  linear scarring noted. There is no pleural effusion or pneumothorax. The central airways are patent. Upper Abdomen: Bilateral renal cysts which are partially visualized and not characterized on this CT. The right renal cysts may have solid component. Further characterization with MRI without and with contrast on a nonemergent basis recommended. Musculoskeletal: There is degenerative changes of the spine. Old left posterior rib fractures. Lower cervical ACDF. No acute osseous pathology. IMPRESSION: 1. Focal nodular masslike density at the right lung base may represent focal infection or neoplasm. Clinical correlation and close follow-up to resolution recommended. If this area persists, tissue sampling may be wanted. 2. A 7 mm left upper lobe pulmonary nodule. 3. Bilateral renal cysts. Further characterization with MRI without and with contrast on a nonemergent basis recommended. Electronically Signed   By: Anner Crete M.D.   On: 08/16/2018 19:50   Dg Chest Port 1 View  Result Date: 08/16/2018 CLINICAL DATA:  Dyspnea EXAM: PORTABLE CHEST 1 VIEW COMPARISON:  None. FINDINGS: Stable mild cardiac enlargement with aortic atherosclerosis. No acute pulmonary consolidation or edema. No effusion or pneumothorax. Chronic left posterior sixth rib fracture with healing. Partially included ACDF hardware without complicating features at the base of the cervical spine. The visualized skeletal structures are unremarkable. IMPRESSION: No active disease. Electronically Signed   By: Ashley Royalty M.D.   On: 08/16/2018 18:24    EKG: Independently reviewed.  Sinus rhythm, first-degree AV block, LAD.  Rate 100 bpm is faster compared to prior, there are no significant change  from prior.  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   Right lower lobe nodular masslike density: Seen on CT chest without contrast.  Currently treating as pneumonia given constellation of symptoms, however neoplasm is not ruled out.  Would also consider PE given hypoxia, tachycardia, dyspnea with history of A. fib and being off Xarelto for at least 2 weeks.  Differential also includes atypical/fungal pneumonia with recent travel. -Continue IV Zosyn -Resume home Xarelto -Supplemental oxygen as needed and wean off as able  Atrial fibrillation status post ablation: In sinus rhythm on admission. -Continue Xarelto -Continue diltiazem  Hypertension/hyperaldosteronism: Blood pressure currently stable. -Continue home diltiazem, HCTZ, losartan, eplerenone  CKD stage II: Currently stable, continue to monitor.  Hyperlipidemia: Continue atorvastatin.  GERD: Continue Protonix.   DVT prophylaxis: Xarelto  Code Status: Full Code  Family Communication: Wife at bedside Disposition Plan: Pending clinical progress Consults called: None Admission status: Observation   Zada Finders MD [facility] Pager [phone]  If 7PM-7AM, please contact night-coverage [email or web address]  08/17/2018, 1:21 AM

## 2018-08-16 NOTE — ED Notes (Signed)
Ill for the past 3-4weeks  Worse todaY IS ON 2ND DOSE OF ANTIBIOTICS

## 2018-08-16 NOTE — ED Provider Notes (Signed)
[place] EMERGENCY DEPARTMENT Provider Note   CSN: [id number] Arrival date & time: 08/16/18  1743     History   Chief Complaint Chief Complaint  Patient presents with  . 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  Pt presents to the ED today with SOB.  He was diagnosed with bronchitis 3 weeks ago and was put on Levaquin for 2 different rounds.  The pt has had chest heaviness since then.  He did have a cardiac cath done by Dr. Tamala Julian on 12/31 which showed normal coronary arteries.  The pt said he's gotten progressively [name] SOB.  The pt said he went to work today and could not breathe by the end of the day.  He tried to walk to his car, but became extremely sob.  Pt is on Xarelto for a hx of a.fib.  Pt has been to Pulmonology and to Cardiology for the SOB.  CHA2DS2/VAS Stroke Risk Points  Current as of 4 minutes ago     1 >= 2 Points: High Risk  1 - 1.99 Points: Medium Risk  0 Points: Low Risk    This is the only CHA2DS2/VAS Stroke Risk Points available for the past  year.:  Last Change: N/A     Details    This score determines the patient's risk of having a stroke if the  patient has atrial fibrillation.       Points Metrics  0 Has Congestive Heart Failure:  No    Current as of 4 minutes ago  0 Has Vascular Disease:  No    Current as of 4 minutes ago  1 Has Hypertension:  Yes    Current as of 4 minutes ago  0 Age:  31    Current as of 4 minutes ago  0 Has Diabetes:  No    Current as of 4 minutes ago  0 Had Stroke:  No  Had TIA:  No  Had thromboembolism:  No    Current as of 4 minutes ago  0 Male:  No    Current as of 4 minutes ago              Past Medical History:  Diagnosis Date  . 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HCC)    ongoing evaluation for elevated R>L adosterone secretion  . Arthritis   . Atrial enlargement, bilateral   . Atrial fibrillation ([place])   . Atrial fibrillation (HCC)    paroxysmal  . Atrial  flutter ([place])   . Complication of anesthesia    could not urinate after UHR, needed cath, mental fog sinc cervical fusion 08-09-16   . Dysrhythmia    a-fib-had ablation x2  . GERD (gastroesophageal reflux disease)   . Hyperaldosteronism ([place])   . Hypertension   . Kidney cysts    "multiple"  . Nephrolithiasis   . Pneumonia 1990's  . Pre-diabetes   . Prostatitis   . Renal insufficiency    "Cr 1.6"    Patient Active Problem List   Diagnosis Date Noted  . Abnormal ECG 07/25/2018  . Renal insufficiency   . Prostatitis   . Pre-diabetes   . Pneumonia   . Nephrolithiasis   . Kidney cysts   . Hyperaldosteronism ([place])   . GERD (gastroesophageal reflux disease)   . Dysrhythmia   . Complication of anesthesia   . Atrial enlargement, bilateral   . Arthritis   . 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[place])   .  Inguinal hernia of left side without obstruction or gangrene Nov 2017 06/23/2016  . Adjustment disorder with depressed mood 05/11/2016  . Atrial fibrillation ([place]) 03/18/2011  . Atrial flutter ([place]) 03/18/2011  . Hypertension 03/18/2011    Past Surgical History:  Procedure Laterality Date  . ABLATION    . ATRIAL FIBRILLATION ABLATION N/A 09/28/2012   Procedure: ATRIAL FIBRILLATION ABLATION;  Surgeon: Thompson Grayer, MD;  Location: [facility] CATH LAB;  Service: Cardiovascular;  Laterality: N/A;  . atrial fibrillation ablation with CTI ablation  05/27/11, 09/28/12   ablation for afib and atrial flutter by Dr Rayann Heman  . BACK SURGERY  08/09/2016   C 5 6  C 6 to C 7 cervical fusion  . CARDIOVERSION  07/28/2011   Procedure: CARDIOVERSION;  Surgeon: Lelon Perla, MD;  Location: [place];  Service: Cardiovascular;  Laterality: N/A;  . COLONOSCOPY WITH PROPOFOL N/A 03/03/2015   Procedure: COLONOSCOPY WITH PROPOFOL;  Surgeon: Garlan Fair, MD;  Location: [facility] ENDOSCOPY;  Service: Gastroenterology;  Laterality: N/A;  . double j stent placement  06/2004   left  . ESOPHAGOGASTRODUODENOSCOPY (EGD) WITH  PROPOFOL N/A 03/03/2015   Procedure: ESOPHAGOGASTRODUODENOSCOPY (EGD) WITH PROPOFOL;  Surgeon: Garlan Fair, MD;  Location: [facility]OS[facility];  Service: Gastroenterology;  Laterality: N/A;  . ESOPHAGOGASTRODUODENOSCOPY (EGD) WITH PROPOFOL N/A 10/25/2016   Procedure: ESOPHAGOGASTRODUODENOSCOPY (EGD) WITH PROPOFOL;  Surgeon: Garlan Fair, MD;  Location: [facility];  Service: Endoscopy;  Laterality: N/A;  . INGUINAL HERNIA REPAIR     "as a child; ? right"  . INGUINAL HERNIA REPAIR Left 06/23/2016   Procedure: OPEN LEFT INGUINAL HERNIA  REPAIR WITH MESH;  Surgeon: Johnathan Hausen, MD;  Location: [facility];  Service: General;  Laterality: Left;  . INSERTION OF MESH Left 06/23/2016   Procedure: INSERTION OF MESH;  Surgeon: Johnathan Hausen, MD;  Location: [facility];  Service: General;  Laterality: Left;  . LEFT HEART CATH AND CORONARY ANGIOGRAPHY N/A 07/25/2018   Procedure: LEFT HEART CATH AND CORONARY ANGIOGRAPHY;  Surgeon: Belva Crome, MD;  Location: [facility] CV LAB;  Service: Cardiovascular;  Laterality: N/A;  . LUNG BIOPSY  1993  . NASAL SEPTOPLASTY W/ TURBINOPLASTY  07/2006  . prostate biopsy  2011  . schwannoma removal  ~01/2010   left thorax  . TEE WITHOUT CARDIOVERSION N/A 09/27/2012   Procedure: TRANSESOPHAGEAL ECHOCARDIOGRAM (TEE);  Surgeon: Larey Dresser, MD;  Location: [facility];  Service: Cardiovascular;  Laterality: N/A;  . ULTRASOUND GUIDANCE FOR VASCULAR ACCESS  07/25/2018   Procedure: Ultrasound Guidance For Vascular Access;  Surgeon: Belva Crome, MD;  Location: [facility] CV LAB;  Service: Cardiovascular;;  . UMBILICAL HERNIA REPAIR  07/2006        Home Medications    Prior to Admission medications   Medication Sig Start Date End Date Taking? Authorizing Provider  allopurinol (ZYLOPRIM) 100 MG tablet Take 100 mg by mouth 2 (two) times daily.    Yes [provider]  atorvastatin (LIPITOR) 10 MG tablet Take 10 mg by mouth  every evening.    Yes [provider]  beclomethasone (QVAR) 80 MCG/ACT inhaler Inhale 1 puff into the lungs 2 (two) times daily.   Yes [provider]  diltiazem (CARDIZEM CD) 360 MG 24 hr capsule Take 360 mg by mouth every evening. 10/04/16  Yes [provider]  eplerenone (INSPRA) 50 MG tablet Take 50 mg by mouth 2 (two) times daily.   Yes [provider]  FIBER  ADULT GUMMIES PO Take 2 tablets by mouth 2 (two) times daily.    Yes [provider]  hydrochlorothiazide (MICROZIDE) 12.5 MG capsule Take 12.5 mg by mouth every evening.    Yes [provider]  ibuprofen (ADVIL,MOTRIN) 200 MG tablet Take 200-400 mg by mouth daily as needed for headache or moderate pain.    Yes [provider]  levofloxacin (LEVAQUIN) 500 MG tablet Take 500 mg by mouth daily. Started on 08-14-18. For 7 days   Yes [provider]  loperamide (IMODIUM A-D) 2 MG tablet Take 2 mg by mouth 2 (two) times daily.   Yes [provider]  losartan (COZAAR) 100 MG tablet Take 100 mg by mouth every evening.   Yes [provider]  metFORMIN (GLUCOPHAGE-XR) 500 MG 24 hr tablet Take 500 mg by mouth 2 (two) times daily.   Yes [provider]  omeprazole (PRILOSEC) 40 MG capsule Take 40 mg by mouth daily before supper.    Yes [provider]  oxybutynin (DITROPAN-XL) 10 MG 24 hr tablet Take 10 mg by mouth daily. 01/28/18  Yes [provider]  Potassium 99 MG TABS Take 198 mg by mouth 2 (two) times daily.   Yes [provider]  sertraline (ZOLOFT) 50 MG tablet Take 1 tablet (50 mg total) by mouth every evening. 12/07/17  Yes Eksir, Richard Miu, MD  vitamin B-12 (CYANOCOBALAMIN) 1000 MCG tablet Take 1,000 mcg by mouth daily.   Yes [provider]  CIALIS 20 MG tablet Take 20 mg by mouth daily as needed for erectile dysfunction.  09/23/16   [provider]  XARELTO 20 MG TABS tablet TAKE 1 TABLET(20 MG)  BY MOUTH DAILY WITH SUPPER 08/07/18   Allred, Jeneen Rinks, MD    Family History Family History  Problem Relation Age of Onset  . Atrial fibrillation Other        sister has had 2 afib ablations  . Atrial fibrillation Sister     Social History Social History   Tobacco Use  . Smoking status: Never Smoker  . Smokeless tobacco: Never Used  Substance Use Topics  . Alcohol use: Yes    Alcohol/week: 2.0 standard drinks    Types: 2 Shots of liquor per week    Comment: occasional, 05-11-2016 Per pt 3 drinks per wk  . Drug use: No    Comment: 05-11-2016 per pt no     Allergies   Aldactone [spironolactone]; Codeine; Lisinopril; Nexium [esomeprazole magnesium]; Sulfa antibiotics; and Sulfa drugs cross reactors   Review of Systems Review of Systems  Respiratory: Positive for cough and shortness of breath.   All other systems reviewed and are negative.    Physical Exam Updated Vital Signs BP (!) 131/97   Pulse 87   Temp 97.9 F (36.6 C) (Oral)   Resp 20   SpO2 99%   Physical Exam Vitals signs and nursing note reviewed.  Constitutional:      Appearance: He is well-developed.  HENT:     Head: Normocephalic and atraumatic.     Mouth/Throat:     Mouth: Mucous membranes are moist.     Pharynx: Oropharynx is clear.  Eyes:     Extraocular Movements: Extraocular movements intact.     Pupils: Pupils are equal, round, and reactive to light.  Neck:     Musculoskeletal: Normal range of motion and neck supple.  Cardiovascular:     Rate and Rhythm: Normal rate and regular rhythm.  Pulmonary:  Effort: Tachypnea present.     Breath sounds: Normal breath sounds.  Abdominal:     General: Bowel sounds are normal.     Palpations: Abdomen is soft.  Musculoskeletal: Normal range of motion.  Skin:    General: Skin is warm and dry.     Capillary Refill: Capillary refill takes less than 2 seconds.  Neurological:     General: No focal deficit present.     Mental Status: He is alert and  oriented to person, place, and time.  Psychiatric:        Mood and Affect: Mood normal.        Behavior: Behavior normal.      ED Treatments / Results  Labs (all labs ordered are listed, but only abnormal results are displayed) Labs Reviewed  BASIC METABOLIC PANEL - Abnormal; Notable for the following components:      Result Value   Potassium 3.4 (*)    Glucose, Bld 112 (*)    BUN 27 (*)    Creatinine, Ser 1.68 (*)    GFR calc non Af Amer 42 (*)    GFR calc Af Amer 49 (*)    All other components within normal limits  CBC WITH DIFFERENTIAL/PLATELET - Abnormal; Notable for the following components:   WBC 13.5 (*)    RDW 15.9 (*)    Neutro Abs 10.1 (*)    Abs Immature Granulocytes 0.19 (*)    All other components within normal limits  BRAIN NATRIURETIC PEPTIDE  INFLUENZA PANEL BY PCR (TYPE A & B)  I-STAT TROPONIN, ED    EKG EKG Interpretation  Date/Time:  Wednesday August 16 2018 17:49:31 EST Ventricular Rate:  100 PR Interval:  212 QRS Duration: 106 QT Interval:  372 QTC Calculation: 479 R Axis:   -79 Text Interpretation:  Sinus rhythm with 1st degree A-V block Left axis deviation Inferior infarct , age undetermined Anterolateral infarct , age undetermined Abnormal ECG No significant change since last tracing Confirmed by Isla Pence [phone]) on 08/16/2018 6:01:12 PM   Radiology Ct Chest Wo Contrast  Result Date: 08/16/2018 CLINICAL DATA:  63 year old male with acute respiratory illness. EXAM: CT CHEST WITHOUT CONTRAST TECHNIQUE: Multidetector CT imaging of the chest was performed following the standard protocol without IV contrast. COMPARISON:  Chest radiograph dated 08/16/2018 and chest CT dated 09/18/2012 FINDINGS: Evaluation of this exam is limited in the absence of intravenous contrast. Cardiovascular: There is no cardiomegaly or pericardial effusion. There is coronary vascular calcification. Mild atherosclerotic calcification of the thoracic aorta. There is mild  dilatation of the main pulmonary trunk suggestive of pulmonary hypertension. Clinical correlation is recommended. Mediastinum/Nodes: There is no hilar or mediastinal adenopathy. The esophagus is grossly unremarkable. No mediastinal fluid collection. Lungs/Pleura: There is a 3.3 x 1.8 cm focal nodular masslike density at the right lung base posteriorly along the posterior costophrenic angle (series 4, image 136). This is new since the CT of 2014. Although this may represent a focal area of infection, a neoplastic process is not excluded. If there is clinical signs of infection follow-up after treatment recommended. If there is no clinical findings of infection or this area persist after treatment, tissue sampling may be warranted. There is a 7 mm nodule in the left upper lobe. Left lung base linear scarring noted. There is no pleural effusion or pneumothorax. The central airways are patent. Upper Abdomen: Bilateral renal cysts which are partially visualized and not characterized on this CT. The right renal cysts may have solid  component. Further characterization with MRI without and with contrast on a nonemergent basis recommended. Musculoskeletal: There is degenerative changes of the spine. Old left posterior rib fractures. Lower cervical ACDF. No acute osseous pathology. IMPRESSION: 1. Focal nodular masslike density at the right lung base may represent focal infection or neoplasm. Clinical correlation and close follow-up to resolution recommended. If this area persists, tissue sampling may be wanted. 2. A 7 mm left upper lobe pulmonary nodule. 3. Bilateral renal cysts. Further characterization with MRI without and with contrast on a nonemergent basis recommended. Electronically Signed   By: Anner Crete M.D.   On: 08/16/2018 19:50   Dg Chest Port 1 View  Result Date: 08/16/2018 CLINICAL DATA:  Dyspnea EXAM: PORTABLE CHEST 1 VIEW COMPARISON:  None. FINDINGS: Stable mild cardiac enlargement with aortic  atherosclerosis. No acute pulmonary consolidation or edema. No effusion or pneumothorax. Chronic left posterior sixth rib fracture with healing. Partially included ACDF hardware without complicating features at the base of the cervical spine. The visualized skeletal structures are unremarkable. IMPRESSION: No active disease. Electronically Signed   By: Ashley Royalty M.D.   On: 08/16/2018 18:24    Procedures Procedures (including critical care time)  Medications Ordered in ED Medications  sodium chloride 0.9 % bolus 1,000 mL (has no administration in time range)  piperacillin-tazobactam (ZOSYN) IVPB 3.375 g (3.375 g Intravenous New Bag/Given 08/16/18 2043)     Initial Impression / Assessment and Plan / ED Course  I have reviewed the triage vital signs and the nursing notes.  Pertinent labs & imaging results that were available during my care of the patient were reviewed by me and considered in my medical decision making (see chart for details).     Pt was hypoxic upon arrival, so he was placed on 2L [place].  The pt's CXR was clear.  He was given IVFs.  CT showed possible focal pna vs neoplasm.  Pt placed on IV zosyn.    Pt d/w Dr. Posey Pronto (triad) for admission.  Final Clinical Impressions(s) / ED Diagnoses   Final diagnoses:  Community acquired pneumonia of right lower lobe of lung ([place])  Hypoxia  CKD (chronic kidney disease) stage 3, GFR 30-59 ml/min [facility])    ED Discharge Orders    None       Isla Pence, MD 08/16/18 2051

## 2018-08-16 NOTE — Progress Notes (Signed)
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  Pharmacy has been consulted for Zosyn dosing.  Plan: Zosyn 3.375 gm IV q8hr (4 hour infusion) Monitor renal function and C&S   Temp (24hrs), Avg:97.9 F (36.6 C), Min:97.9 F (36.6 C), Max:97.9 F (36.6 C)  Recent Labs  Lab 08/16/18 1815  WBC 13.5*  CREATININE 1.68*    CrCl cannot be calculated (Unknown ideal weight.).    Allergies  Allergen Reactions  . Aldactone [Spironolactone]     Breast tissue enlargement  . Codeine Other (See Comments)    Headache   . Lisinopril Cough  . Nexium [Esomeprazole Magnesium] Diarrhea  . Sulfa Antibiotics Rash  . Sulfa Drugs Cross Reactors Rash    Antimicrobials this admission: Zosyn 1/22 >>   Thank you for allowing pharmacy to be a part of this patient's care.  Alanda Slim, PharmD, [facility] Clinical Pharmacist Please see AMION for all Pharmacists' Contact Phone Numbers 08/16/2018, 9:46 PM

## 2018-08-16 NOTE — ED Notes (Signed)
Report given to 5C RN. All questions answered 

## 2018-08-16 NOTE — ED Triage Notes (Signed)
Pt reports being diagnosed with bronchitis 3 weeks ago. Pt reports SHOB since, worsening the last few days. Pt reports chest heaviness, had a cardiac cath done 2 weeks ago. Pt reports having some nasal drainage and cough x 2-3 days.

## 2018-08-16 NOTE — ED Notes (Signed)
Gave pt sandwich and drink  

## 2018-08-17 ENCOUNTER — Encounter (HOSPITAL_COMMUNITY): Payer: Self-pay

## 2018-08-17 DIAGNOSIS — R911 Solitary pulmonary nodule: Secondary | ICD-10-CM

## 2018-08-17 DIAGNOSIS — J9600 Acute respiratory failure, unspecified whether with hypoxia or hypercapnia: Secondary | ICD-10-CM

## 2018-08-17 DIAGNOSIS — J9601 Acute respiratory failure with hypoxia: Secondary | ICD-10-CM | POA: Diagnosis not present

## 2018-08-17 DIAGNOSIS — I10 Essential (primary) hypertension: Secondary | ICD-10-CM | POA: Diagnosis not present

## 2018-08-17 DIAGNOSIS — K219 Gastro-esophageal reflux disease without esophagitis: Secondary | ICD-10-CM

## 2018-08-17 DIAGNOSIS — E785 Hyperlipidemia, unspecified: Secondary | ICD-10-CM

## 2018-08-17 DIAGNOSIS — J181 Lobar pneumonia, unspecified organism: Secondary | ICD-10-CM | POA: Diagnosis not present

## 2018-08-17 DIAGNOSIS — N182 Chronic kidney disease, stage 2 (mild): Secondary | ICD-10-CM | POA: Diagnosis not present

## 2018-08-17 LAB — HIV ANTIBODY (ROUTINE TESTING W REFLEX): HIV SCREEN 4TH GENERATION: NONREACTIVE

## 2018-08-17 LAB — CBC
HCT: 40.8 % (ref 39.0–52.0)
Hemoglobin: 12.9 g/dL — ABNORMAL LOW (ref 13.0–17.0)
MCH: 27.4 pg (ref 26.0–34.0)
MCHC: 31.6 g/dL (ref 30.0–36.0)
MCV: 86.8 fL (ref 80.0–100.0)
PLATELETS: 208 10*3/uL (ref 150–400)
RBC: 4.7 MIL/uL (ref 4.22–5.81)
RDW: 15.7 % — AB (ref 11.5–15.5)
WBC: 10 10*3/uL (ref 4.0–10.5)
nRBC: 0 % (ref 0.0–0.2)

## 2018-08-17 LAB — BASIC METABOLIC PANEL
Anion gap: 10 (ref 5–15)
BUN: 26 mg/dL — AB (ref 8–23)
CO2: 25 mmol/L (ref 22–32)
CREATININE: 1.66 mg/dL — AB (ref 0.61–1.24)
Calcium: 8.6 mg/dL — ABNORMAL LOW (ref 8.9–10.3)
Chloride: 104 mmol/L (ref 98–111)
GFR calc Af Amer: 50 mL/min — ABNORMAL LOW (ref >60)
GFR calc non Af Amer: 43 mL/min — ABNORMAL LOW (ref >60)
Glucose, Bld: 93 mg/dL (ref 70–99)
Potassium: 3.6 mmol/L (ref 3.5–5.1)
Sodium: 139 mmol/L (ref 135–145)

## 2018-08-17 LAB — SEDIMENTATION RATE: Sed Rate: 13 mm/hr (ref 0–16)

## 2018-08-17 LAB — RESPIRATORY PANEL BY PCR
Adenovirus: NOT DETECTED
Bordetella pertussis: NOT DETECTED
CORONAVIRUS HKU1-RVPPCR: NOT DETECTED
Chlamydophila pneumoniae: NOT DETECTED
Coronavirus 229E: NOT DETECTED
Coronavirus NL63: NOT DETECTED
Coronavirus OC43: NOT DETECTED
Influenza A: NOT DETECTED
Influenza B: NOT DETECTED
Metapneumovirus: NOT DETECTED
Mycoplasma pneumoniae: NOT DETECTED
Parainfluenza Virus 1: NOT DETECTED
Parainfluenza Virus 2: NOT DETECTED
Parainfluenza Virus 3: NOT DETECTED
Parainfluenza Virus 4: NOT DETECTED
Respiratory Syncytial Virus: NOT DETECTED
Rhinovirus / Enterovirus: NOT DETECTED

## 2018-08-17 LAB — STREP PNEUMONIAE URINARY ANTIGEN: Strep Pneumo Urinary Antigen: NEGATIVE

## 2018-08-17 LAB — C-REACTIVE PROTEIN: CRP: 4.4 mg/dL — ABNORMAL HIGH (ref <1.0)

## 2018-08-17 MED ORDER — SODIUM CHLORIDE 0.9 % IV SOLN: 2.0000 g | INTRAVENOUS | Status: DC

## 2018-08-17 MED ORDER — PREDNISONE 20 MG PO TABS: 40.0000 mg | ORAL_TABLET | Freq: Every day | ORAL | Status: DC

## 2018-08-17 MED ORDER — PREDNISONE 10 MG PO TABS: 10.0000 mg | ORAL_TABLET | Freq: Every day | ORAL | Status: DC

## 2018-08-17 MED ORDER — LEVALBUTEROL HCL 1.25 MG/0.5ML IN NEBU
1.2500 mg | INHALATION_SOLUTION | Freq: Four times a day (QID) | RESPIRATORY_TRACT | Status: DC
  Administered 2018-08-18 (×2): 1.25 mg via RESPIRATORY_TRACT
  Filled 2018-08-17 (×2): qty 0.5

## 2018-08-17 MED ORDER — POTASSIUM CHLORIDE CRYS ER 10 MEQ PO TBCR
10.0000 meq | EXTENDED_RELEASE_TABLET | Freq: Every day | ORAL | Status: DC
  Administered 2018-08-17 – 2018-08-19 (×3): 10 meq via ORAL
  Filled 2018-08-17 (×3): qty 1

## 2018-08-17 MED ORDER — PREDNISONE 20 MG PO TABS: 30.0000 mg | ORAL_TABLET | Freq: Every day | ORAL | Status: DC

## 2018-08-17 MED ORDER — PREDNISONE 20 MG PO TABS: 20.0000 mg | ORAL_TABLET | Freq: Every day | ORAL | Status: DC

## 2018-08-17 MED ORDER — PREDNISONE 20 MG PO TABS
40.0000 mg | ORAL_TABLET | Freq: Every day | ORAL | Status: AC
  Administered 2018-08-17 – 2018-08-18 (×2): 40 mg via ORAL
  Filled 2018-08-17 (×2): qty 2

## 2018-08-17 MED ORDER — POTASSIUM CHLORIDE CRYS ER 10 MEQ PO TBCR: 5.0000 meq | EXTENDED_RELEASE_TABLET | Freq: Two times a day (BID) | ORAL | Status: DC

## 2018-08-17 MED ORDER — VITAMIN B-12 1000 MCG PO TABS
1000.0000 ug | ORAL_TABLET | Freq: Every day | ORAL | Status: DC
  Administered 2018-08-18 – 2018-08-19 (×2): 1000 ug via ORAL
  Filled 2018-08-17 (×2): qty 1

## 2018-08-17 MED ORDER — PREDNISONE 20 MG PO TABS
30.0000 mg | ORAL_TABLET | Freq: Every day | ORAL | Status: DC
  Administered 2018-08-19: 30 mg via ORAL
  Filled 2018-08-17: qty 1

## 2018-08-17 MED ORDER — IPRATROPIUM BROMIDE 0.02 % IN SOLN
0.5000 mg | Freq: Four times a day (QID) | RESPIRATORY_TRACT | Status: DC
  Administered 2018-08-17 – 2018-08-18 (×5): 0.5 mg via RESPIRATORY_TRACT
  Filled 2018-08-17 (×4): qty 2.5

## 2018-08-17 MED ORDER — BUDESONIDE 0.25 MG/2ML IN SUSP
0.5000 mg | Freq: Two times a day (BID) | RESPIRATORY_TRACT | Status: DC
  Administered 2018-08-17: 0.25 mg via RESPIRATORY_TRACT
  Administered 2018-08-18 – 2018-08-19 (×3): 0.5 mg via RESPIRATORY_TRACT
  Filled 2018-08-17 (×5): qty 4

## 2018-08-17 MED ORDER — OXYBUTYNIN CHLORIDE ER 10 MG PO TB24
10.0000 mg | ORAL_TABLET | Freq: Every day | ORAL | Status: DC
  Administered 2018-08-17 – 2018-08-19 (×3): 10 mg via ORAL
  Filled 2018-08-17 (×3): qty 1

## 2018-08-17 MED ORDER — AMOXICILLIN-POT CLAVULANATE 875-125 MG PO TABS
1.0000 | ORAL_TABLET | Freq: Two times a day (BID) | ORAL | Status: DC
  Administered 2018-08-17 – 2018-08-19 (×4): 1 via ORAL
  Filled 2018-08-17 (×5): qty 1

## 2018-08-17 MED ORDER — SALINE SPRAY 0.65 % NA SOLN
1.0000 | NASAL | Status: DC | PRN
  Filled 2018-08-17 (×2): qty 44

## 2018-08-17 MED ORDER — LEVALBUTEROL HCL 1.25 MG/0.5ML IN NEBU
1.2500 mg | INHALATION_SOLUTION | Freq: Four times a day (QID) | RESPIRATORY_TRACT | Status: DC
  Administered 2018-08-17 (×2): 1.25 mg via RESPIRATORY_TRACT
  Filled 2018-08-17 (×2): qty 0.5

## 2018-08-17 NOTE — Progress Notes (Signed)
Patient sleeping upon rounding, per pt request earlier in the shift he was not woken. pts HR read 83 and O2%  95 at this time.

## 2018-08-17 NOTE — Progress Notes (Signed)
Progress Note    Casey [name], Dr.  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 Time line 1993: s/p lung biopsy for hemorraghic PNA- -? hanta virus October-- went to [place] November- dyspnea-- has V/Q negative and LE duplex (thromboplebitis) Saw outpatient pulm (plan was for PFTs) December- travelled to [facility]- went hiking -had cath- normal -xarelto stopped in anticipation of procedure January -episode of bronchitis/had to cancel PFTs -10 days of levaquin 500mg --- slightly improved after This past weekend/week-- progressive dyspnea to the point that Wednesday he was unable to walk to his car   Assessment/Plan:   Principal Problem:   Community acquired pneumonia of right lower lobe of lung ([place]) Active Problems:   Atrial fibrillation ([place])   Hypertension   Hyperaldosteronism ([place])   GERD (gastroesophageal reflux disease)   CKD (chronic kidney disease), stage II   Hyperlipidemia   Acute respiratory failure (HCC)  Acute respiratory failure -drops to 83% on RA -resume O2  Right lower lobe nodular mass-like density: -Seen on CT chest without contrast. -Currently treating as pneumonia given constellation of symptoms, however neoplasm is not ruled out.   -? PE as has been off xarelto but appears to be a slower progression--- not sudden -Continue IV Zosyn (has already had levaquin) -denies aspiration-- does have GERD -Resume home Xarelto -Supplemental oxygen as needed and wean off as able  Atrial fibrillation status post ablation: In sinus  rhythm on admission. -Continue Xarelto -Continue diltiazem  Hypertension/hyperaldosteronism: Blood pressure currently stable. -Continue home diltiazem, HCTZ, losartan, eplerenone  CKD stage II: Currently stable, continue to monitor.  Hyperlipidemia: Continue atorvastatin.  GERD: Continue Protonix.   Family Communication/Anticipated D/C date and plan/Code Status   DVT prophylaxis: xarelto Code Status: Full Code.  Family Communication: wife at bedside Disposition Plan:    Medical Consultants:    Pulm     Subjective:   Still very short of breath  Objective:    Vitals:   08/17/18 0900 08/17/18 1141 08/17/18 1142 08/17/18 1149  BP:    (!) 129/91  Pulse:    63  Resp:    20  Temp:    97.8 F (36.6 C)  TempSrc:    Oral  SpO2: 94% (!) 83% 90% 93%    Intake/Output Summary (Last 24 hours) at 08/17/2018 1323 Last data filed at 08/17/2018 0859 Gross per 24 hour  Intake 360 ml  Output -  Net 360 ml   There were no vitals filed for this visit.  Exam: In bed, on 2-3L O2 Clear breath sounds, mild increased work of breathing No LE edema No rashes +BS, soft, NT  Data Reviewed:   I have personally reviewed following labs and imaging studies:  Labs: Labs show the following:   Basic Metabolic Panel: Recent Labs  Lab 08/16/18 1815 08/17/18 0253  NA 138 139  K 3.4* 3.6  CL 103 104  CO2 24 25  GLUCOSE 112* 93  BUN 27* 26*  CREATININE 1.68* 1.66*  CALCIUM 9.0 8.6*   GFR CrCl cannot be calculated (Unknown ideal weight.). Liver Function Tests: No results for input(s): AST,  ALT, ALKPHOS, BILITOT, PROT, ALBUMIN in the last 168 hours. No results for input(s): LIPASE, AMYLASE in the last 168 hours. No results for input(s): AMMONIA in the last 168 hours. Coagulation profile No results for input(s): INR, PROTIME in the last 168 hours.  CBC: Recent Labs  Lab 08/16/18 1815 08/17/18 0253  WBC 13.5* 10.0  NEUTROABS 10.1*  --   HGB 13.7 12.9*  HCT  42.7 40.8  MCV 87.7 86.8  PLT 239 208   Cardiac Enzymes: No results for input(s): CKTOTAL, CKMB, CKMBINDEX, TROPONINI in the last 168 hours. BNP (last 3 results) No results for input(s): PROBNP in the last 8760 hours. CBG: No results for input(s): GLUCAP in the last 168 hours. D-Dimer: No results for input(s): DDIMER in the last 72 hours. Hgb A1c: No results for input(s): HGBA1C in the last 72 hours. Lipid Profile: No results for input(s): CHOL, HDL, LDLCALC, TRIG, CHOLHDL, LDLDIRECT in the last 72 hours. Thyroid function studies: No results for input(s): TSH, T4TOTAL, T3FREE, THYROIDAB in the last 72 hours.  Invalid input(s): FREET3 Anemia work up: No results for input(s): VITAMINB12, FOLATE, FERRITIN, TIBC, IRON, RETICCTPCT in the last 72 hours. Sepsis Labs: Recent Labs  Lab 08/16/18 1815 08/17/18 0253  WBC 13.5* 10.0    Microbiology No results found for this or any previous visit (from the past 240 hour(s)).  Procedures and diagnostic studies:  Ct Chest Wo Contrast  Result Date: 08/16/2018 CLINICAL DATA:  63 year old male with acute respiratory illness. EXAM: CT CHEST WITHOUT CONTRAST TECHNIQUE: Multidetector CT imaging of the chest was performed following the standard protocol without IV contrast. COMPARISON:  Chest radiograph dated 08/16/2018 and chest CT dated 09/18/2012 FINDINGS: Evaluation of this exam is limited in the absence of intravenous contrast. Cardiovascular: There is no cardiomegaly or pericardial effusion. There is coronary vascular calcification. Mild atherosclerotic calcification of the thoracic aorta. There is mild dilatation of the main pulmonary trunk suggestive of pulmonary hypertension. Clinical correlation is recommended. Mediastinum/Nodes: There is no hilar or mediastinal adenopathy. The esophagus is grossly unremarkable. No mediastinal fluid collection. Lungs/Pleura: There is a 3.3 x 1.8 cm focal nodular masslike density at the right lung base  posteriorly along the posterior costophrenic angle (series 4, image 136). This is new since the CT of 2014. Although this may represent a focal area of infection, a neoplastic process is not excluded. If there is clinical signs of infection follow-up after treatment recommended. If there is no clinical findings of infection or this area persist after treatment, tissue sampling may be warranted. There is a 7 mm nodule in the left upper lobe. Left lung base linear scarring noted. There is no pleural effusion or pneumothorax. The central airways are patent. Upper Abdomen: Bilateral renal cysts which are partially visualized and not characterized on this CT. The right renal cysts may have solid component. Further characterization with MRI without and with contrast on a nonemergent basis recommended. Musculoskeletal: There is degenerative changes of the spine. Old left posterior rib fractures. Lower cervical ACDF. No acute osseous pathology. IMPRESSION: 1. Focal nodular masslike density at the right lung base may represent focal infection or neoplasm. Clinical correlation and close follow-up to resolution recommended. If this area persists, tissue sampling may be wanted. 2. A 7 mm left upper lobe pulmonary nodule. 3. Bilateral renal cysts. Further characterization with MRI without and with contrast on a nonemergent basis recommended. Electronically Signed   By: Anner Crete M.D.   On: 08/16/2018 19:50   Dg Chest [facility]  1 View  Result Date: 08/16/2018 CLINICAL DATA:  Dyspnea EXAM: PORTABLE CHEST 1 VIEW COMPARISON:  None. FINDINGS: Stable mild cardiac enlargement with aortic atherosclerosis. No acute pulmonary consolidation or edema. No effusion or pneumothorax. Chronic left posterior sixth rib fracture with healing. Partially included ACDF hardware without complicating features at the base of the cervical spine. The visualized skeletal structures are unremarkable. IMPRESSION: No active disease. Electronically Signed    By: Ashley Royalty M.D.   On: 08/16/2018 18:24    Medications:   . allopurinol  100 mg Oral BID  . atorvastatin  10 mg Oral QPM  . budesonide  0.25 mg Inhalation BID  . diltiazem  360 mg Oral QPM  . eplerenone  50 mg Oral BID  . hydrochlorothiazide  12.5 mg Oral QPM  . losartan  100 mg Oral QPM  . oxybutynin  10 mg Oral Daily  . pantoprazole  40 mg Oral QAC supper  . potassium chloride  10 mEq Oral Daily  . rivaroxaban  20 mg Oral Q supper  . sertraline  50 mg Oral QPM  . vitamin B-12  1,000 mcg Oral Daily   Continuous Infusions: . piperacillin-tazobactam (ZOSYN)  IV 3.375 g (08/17/18 0603)     LOS: 0 days   Geradine Girt  [facility]   *Please refer to [email or web address], password [email or web address] to get updated schedule on who will round on this patient, as hospitalists switch teams weekly. If 7PM-7AM, please contact night-coverage at [email or web address], password [email or web address] for any overnight needs.  08/17/2018, 1:23 PM

## 2018-08-17 NOTE — Consult Note (Signed)
NAME:  Casey [name], Dr., MRN:  [id number], DOB:  1955-07-28, LOS: 0 ADMISSION DATE:  08/16/2018, CONSULTATION DATE:  08/17/18 REFERRING MD:  Dr. Eliseo Squires, CHIEF COMPLAINT:  Cough, SOB, Chest heaviness   Brief History   63 y/o M admitted with cough & SOB.  Found to be hypoxic with saturations to 87%.  History of present illness   63 y/o M admitted 1/22 with reports of shortness of breath, cough, nasal drainage and chest heaviness.    In the past, he was followed by Dr. Arlyce Dice for left pleural mass (2011) which was biopsied and found to be a benign schwannoma and he underwent resection via VATS.  He is an occasional cigar smoker.  Medication review does show ARB use.  He is intolerant to ACE-I inhibitors due to cough.     He was seen in the ER on 11/6 with complaints of shortness of breath.  At that time, he had recently traveled to [place].  RLE venous duplex showed a superficial thrombophlebitis.  He had a break in his anticoagulation with his cardiologist's approval after cardioversion.  With concerns for LE pain, he restarted the Xarelto. VQ scan was performed at that time (11/6) which was negative for ventilation / perfusion defect.  He followed up with Dr. Valeta Harms on 11/25 for cough & SOB.  At that time, PFT's were ordered with plan of possible cardiopulmonary stress testing.  In the course of his illness, he completed two courses of levaquin.  He followed up with Cardiology on 12/9 and was ordered for ECHO, stress testing and LHC. ECHO 12/24 showed an LVEF 50-55%, normal wall motion.  He had exercise stress testing 12/24 which was negative for ST segment deviation.  LHC on 12/31 was negative for ischemic diease.  He was unable to complete PFT's 08/01/18 due to acute illness.     He returned to the ER on 1/22 with reports of ongoing SOB.  He reported since his LHC he had been experiencing chest heaviness and progressive SOB.  He went to work on 1/22 and felt as though he could not breathe by the end of  the day.  Tried to walk to the car but was so SOB he had difficulty.  CT of the chest w/o contrast was obtained which demonstrated mild dilation of the main pulmonary trunk suggestive of pulmonary HTN, focal nodular masslike density at the right lung base (3.3x1.8 cm), mild thickening of the airways, 37m LUL pulmonary nodule, bilateral renal cysts.    PCCM consulted for evaluation.   He reports his wife was recently sick with bronchitis.  Reports he initially felt better after first round of abx but then developed worsening symptoms.  Exposure review includes - potential repeated exposures to amphotericin nebulization at work. Has had cats most of his life but none in the last few years.  States recent fever to 102 at home.     Past Medical History  AF - paroxysmal  Superficial Thrombophlebitis - 2019  GERD Hyperaldosteronism  Chronically elevated left hemidiaphragm HTN Renal cysts Renal insufficiency - sr cr 1.6 Pre-diabetes  Significant Hospital Events   1/22  Admit   Consults:  1/23 PCCM   Procedures:    Significant Diagnostic Tests:  CT Chest w/o 1/22 >> mild dilation of the main pulmonary trunk suggestive of pulmonary HTN, focal nodular masslike density at the right lung base (3.3x1.8 cm), mild thickening of the airways, 744mLUL pulmonary nodule, bilateral renal cysts   Micro Data:  Antimicrobials:  Zoysn 1/22 >> 1/23 Augmentin 1/23 >>  Interim history/subjective:  Reports he ambulated this am w/o O2 and saturations dropped to 83% off O2  Objective   Blood pressure (!) 129/91, pulse 63, temperature 97.8 F (36.6 C), temperature source Oral, resp. rate 20, SpO2 93 %.        Intake/Output Summary (Last 24 hours) at 08/17/2018 1325 Last data filed at 08/17/2018 0859 Gross per 24 hour  Intake 360 ml  Output -  Net 360 ml   There were no vitals filed for this visit.  Examination: General: well appearing adult male sitting at side of bed in NAD HEENT: MM  pink/moist, malampati II-III, no oral exudates or posterior drainage, pupils 58m reactive to light Neuro: AAOx4, speech clear, MAE, normal strength  CV: s1s2 rrr, no m/r/g PULM: even/non-labored, lungs bilaterally clear anterior, bronchial breath sounds GST:MHDQ non-tender, bsx4 active  Extremities: warm/dry, no edema, LE skin discoloration / changes  Skin: no rashes or lesions  Resolved Hospital Problem list     Assessment & Plan:   Acute Hypoxemic Respiratory Failure  -suspect multifactorial in setting of recent URI / illness, possible undiagnosed reactive airway disease, ? Pulmonary hypertension (noted enlarged PA's on CT), possible rounded atelectasis (vs mass) in RLL, hypersensitivity / exposures and undiagnosed sleep apnea.  Recent VQ negative for PE and he has been on anticoagulation, makes PE less likely.    Plan: Pulmonary hygiene -IS, mobilize  Xopenex + Atrovent Q6  Pulmicort BID  Add prednisone taper  Assess ANA, ANCA, ESR, CRP, HSP, RAST Assess RVP  Will need CT as below  Follow up with Dr. IValeta Harmsas outpatient in 2 weeks  Will need ambulatory O2 assessment prior to discharge ? Consider RHC to evaluate pulmonary pressures   LUL Pulmonary Nodule RLL Nodular Mass-Like Opacity  P: Will need outpatient pulmonary follow up with HRCT, insp/exp cuts.  Pending review, may need to consider FOB vs open lung biopsy Complete Augmentin for possible CAP  Best practice:  Diet: As tolerated  Pain/Anxiety/Delirium protocol (if indicated): n/a VAP protocol (if indicated): n/a  DVT prophylaxis: xarelto  GI prophylaxis: ppi  Glucose control: per primary  Mobility: as tolerated  Code Status: Full code  Family Communication: Patient and wife updated on plan of care.  Disposition: Floor / TRH   Labs   CBC: Recent Labs  Lab 08/16/18 1815 08/17/18 0253  WBC 13.5* 10.0  NEUTROABS 10.1*  --   HGB 13.7 12.9*  HCT 42.7 40.8  MCV 87.7 86.8  PLT 239 2222   Basic Metabolic  Panel: Recent Labs  Lab 08/16/18 1815 08/17/18 0253  NA 138 139  K 3.4* 3.6  CL 103 104  CO2 24 25  GLUCOSE 112* 93  BUN 27* 26*  CREATININE 1.68* 1.66*  CALCIUM 9.0 8.6*   GFR: CrCl cannot be calculated (Unknown ideal weight.). Recent Labs  Lab 08/16/18 1815 08/17/18 0253  WBC 13.5* 10.0    Liver Function Tests: No results for input(s): AST, ALT, ALKPHOS, BILITOT, PROT, ALBUMIN in the last 168 hours. No results for input(s): LIPASE, AMYLASE in the last 168 hours. No results for input(s): AMMONIA in the last 168 hours.  ABG    Component Value Date/Time   PHART 7.420 01/23/2010 0456   PCO2ART 44.9 01/23/2010 0456   PO2ART 78.0 (L) 01/23/2010 0456   HCO3 29.0 (H) 01/23/2010 0456   TCO2 29 08/01/2010 1341   O2SAT 95.0 01/23/2010 0456     Coagulation  Profile: No results for input(s): INR, PROTIME in the last 168 hours.  Cardiac Enzymes: No results for input(s): CKTOTAL, CKMB, CKMBINDEX, TROPONINI in the last 168 hours.  HbA1C: No results found for: HGBA1C  CBG: No results for input(s): GLUCAP in the last 168 hours.  Review of Systems: Positives in bold  Gen: Denies fever, chills, weight change of 5lbs in last 2 weeks, fatigue, night sweats HEENT: Denies blurred vision, double vision, hearing loss, tinnitus, sinus congestion, rhinorrhea, sore throat, neck stiffness, dysphagia PULM: Denies shortness of breath, non-productive cough, occasional creamy sputum production, hemoptysis, wheezing CV: Denies chest pain, edema, orthopnea, paroxysmal nocturnal dyspnea, palpitations GI: Denies abdominal pain, nausea, vomiting, diarrhea, hematochezia, melena, constipation, change in bowel habits GU: Denies dysuria, hematuria, polyuria, oliguria, urethral discharge Endocrine: Denies hot or cold intolerance, polyuria, polyphagia or appetite change Derm: Denies rash, dry skin, scaling or peeling skin change Heme: Denies easy bruising, bleeding, bleeding gums Neuro: Denies  headache, numbness, weakness, slurred speech, loss of memory or consciousness   Past Medical History  He,  has a past medical history of 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HCC), Arthritis, Atrial enlargement, bilateral, Atrial fibrillation (HCC), Atrial fibrillation (HCC), Atrial flutter ([place]), Complication of anesthesia, Dysrhythmia, GERD (gastroesophageal reflux disease), Hyperaldosteronism ([place]), Hypertension, Kidney cysts, Nephrolithiasis, Pneumonia (1990's), Pre-diabetes, Prostatitis, and Renal insufficiency.   Surgical History    Past Surgical History:  Procedure Laterality Date  . ABLATION    . ATRIAL FIBRILLATION ABLATION N/A 09/28/2012   Procedure: ATRIAL FIBRILLATION ABLATION;  Surgeon: Thompson Grayer, MD;  Location[facility] CATH LAB;  Service: Cardiovascular;  Laterality: N/A;  . atrial fibrillation ablation with CTI ablation  05/27/11, 09/28/12   ablation for afib and atrial flutter by Dr Rayann Heman  . BACK SURGERY  08/09/2016   C 5 6  C 6 to C 7 cervical fusion  . CARDIOVERSION  07/28/2011   Procedure: CARDIOVERSION;  Surgeon: Lelon Perla, MD;  Location: [place];  Service: Cardiovascular;  Laterality: N/A;  . COLONOSCOPY WITH PROPOFOL N/A 03/03/2015   Procedure: COLONOSCOPY WITH PROPOFOL;  Surgeon: Garlan Fair, MD;  Location: [facility] ENDOSCOPY;  Service: Gastroenterology;  Laterality: N/A;  . double j stent placement  06/2004   left  . ESOPHAGOGASTRODUODENOSCOPY (EGD) WITH PROPOFOL N/A 03/03/2015   Procedure: ESOPHAGOGASTRODUODENOSCOPY (EGD) WITH PROPOFOL;  Surgeon: Garlan Fair, MD;  Location: [facility] ENDOSCOPY;  Service: Gastroenterology;  Laterality: N/A;  . ESOPHAGOGASTRODUODENOSCOPY (EGD) WITH PROPOFOL N/A 10/25/2016   Procedure: ESOPHAGOGASTRODUODENOSCOPY (EGD) WITH PROPOFOL;  Surgeon: Garlan Fair, MD;  Location: [facility] ENDOSCOPY;  Service: Endoscopy;  Laterality: N/A;  . INGUINAL HERNIA REPAIR     "as a child; ? right"  . INGUINAL HERNIA REPAIR Left 06/23/2016   Procedure: OPEN LEFT  INGUINAL HERNIA  REPAIR WITH MESH;  Surgeon: Johnathan Hausen, MD;  Location: [place];  Service: General;  Laterality: Left;  . INSERTION OF MESH Left 06/23/2016   Procedure: INSERTION OF MESH;  Surgeon: Johnathan Hausen, MD;  Location: [place];  Service: General;  Laterality: Left;  . LEFT HEART CATH AND CORONARY ANGIOGRAPHY N/A 07/25/2018   Procedure: LEFT HEART CATH AND CORONARY ANGIOGRAPHY;  Surgeon: Belva Crome, MD;  Location: [place] CV LAB;  Service: Cardiovascular;  Laterality: N/A;  . LUNG BIOPSY  1993  . NASAL SEPTOPLASTY W/ TURBINOPLASTY  07/2006  . prostate biopsy  2011  . schwannoma removal  ~01/2010   left thorax  . TEE WITHOUT CARDIOVERSION N/A 09/27/2012   Procedure: TRANSESOPHAGEAL ECHOCARDIOGRAM (  TEE);  Surgeon: Larey Dresser, MD;  Location: [place];  Service: Cardiovascular;  Laterality: N/A;  . ULTRASOUND GUIDANCE FOR VASCULAR ACCESS  07/25/2018   Procedure: Ultrasound Guidance For Vascular Access;  Surgeon: Belva Crome, MD;  Location: [place] CV LAB;  Service: Cardiovascular;;  . UMBILICAL HERNIA REPAIR  07/2006     Social History   reports that he has never smoked. He has never used smokeless tobacco. He reports current alcohol use of about 2.0 standard drinks of alcohol per week. He reports that he does not use drugs.   Family History   His family history includes Atrial fibrillation in his sister and another family member.   Allergies Allergies  Allergen Reactions  . Aldactone [Spironolactone]     Breast tissue enlargement  . Codeine Other (See Comments)    Headache   . Lisinopril Cough  . Nexium [Esomeprazole Magnesium] Diarrhea  . Sulfa Antibiotics Rash  . Sulfa Drugs Cross Reactors Rash     Home Medications  Prior to Admission medications   Medication Sig Start Date End Date Taking? Authorizing Provider  allopurinol (ZYLOPRIM) 100 MG tablet Take 100 mg by mouth 2 (two) times daily.    Yes [provider]  atorvastatin (LIPITOR) 10 MG tablet Take 10 mg by mouth every evening.    Yes [provider]  beclomethasone (QVAR) 80 MCG/ACT inhaler Inhale 1 puff into the lungs 2 (two) times daily.   Yes [provider]  diltiazem (CARDIZEM CD) 360 MG 24 hr capsule Take 360 mg by mouth every evening. 10/04/16  Yes [provider]  eplerenone (INSPRA) 50 MG tablet Take 50 mg by mouth 2 (two) times daily.   Yes [provider]  FIBER ADULT GUMMIES PO Take 2 tablets by mouth 2 (two) times daily.    Yes [provider]  hydrochlorothiazide (MICROZIDE) 12.5 MG capsule Take 12.5 mg by mouth every evening.    Yes [provider]  ibuprofen (ADVIL,MOTRIN) 200 MG tablet Take 200-400 mg by mouth daily as needed for headache or moderate pain.    Yes [provider]  levofloxacin (LEVAQUIN) 500 MG tablet Take 500 mg by mouth daily. Started on 08-14-18. For 7 days   Yes [provider]  loperamide (IMODIUM A-D) 2 MG tablet Take 2 mg by mouth 2 (two) times daily.   Yes [provider]  losartan (COZAAR) 100 MG tablet Take 100 mg by mouth every evening.   Yes [provider]  metFORMIN (GLUCOPHAGE-XR) 500 MG 24 hr tablet Take 500 mg by mouth 2 (two) times daily.   Yes [provider]  omeprazole (PRILOSEC) 40 MG capsule Take 40 mg by mouth daily before supper.    Yes [provider]  oxybutynin (DITROPAN-XL) 10 MG 24 hr tablet Take 10 mg by mouth daily. 01/28/18  Yes [provider]  Potassium 99 MG TABS Take 198 mg by mouth 2 (two) times daily.   Yes [provider]  sertraline (ZOLOFT) 50 MG tablet Take 1 tablet (50 mg total) by mouth every evening. 12/07/17  Yes Eksir, Richard Miu, MD  vitamin B-12 (CYANOCOBALAMIN) 1000 MCG tablet Take 1,000 mcg by mouth daily.   Yes [provider]  CIALIS 20 MG tablet Take 20 mg by mouth daily as needed for erectile dysfunction.  09/23/16    [provider]  XARELTO 20 MG TABS tablet TAKE 1 TABLET(20 MG) BY MOUTH DAILY WITH SUPPER 08/07/18   Allred, Jeneen Rinks,  MD     Critical care time: n/a     Noe Gens, NP-C [facility] Pgr: [phone] or if no answer [phone] 08/17/2018, 1:26 PM

## 2018-08-18 ENCOUNTER — Inpatient Hospital Stay (HOSPITAL_COMMUNITY): Payer: Managed Care, Other (non HMO)

## 2018-08-18 DIAGNOSIS — Z7901 Long term (current) use of anticoagulants: Secondary | ICD-10-CM | POA: Diagnosis not present

## 2018-08-18 DIAGNOSIS — E785 Hyperlipidemia, unspecified: Secondary | ICD-10-CM

## 2018-08-18 DIAGNOSIS — I480 Paroxysmal atrial fibrillation: Secondary | ICD-10-CM | POA: Diagnosis present

## 2018-08-18 DIAGNOSIS — J9601 Acute respiratory failure with hypoxia: Secondary | ICD-10-CM | POA: Diagnosis present

## 2018-08-18 DIAGNOSIS — I129 Hypertensive chronic kidney disease with stage 1 through stage 4 chronic kidney disease, or unspecified chronic kidney disease: Secondary | ICD-10-CM | POA: Diagnosis present

## 2018-08-18 DIAGNOSIS — E269 Hyperaldosteronism, unspecified: Secondary | ICD-10-CM | POA: Diagnosis present

## 2018-08-18 DIAGNOSIS — K219 Gastro-esophageal reflux disease without esophagitis: Secondary | ICD-10-CM | POA: Diagnosis present

## 2018-08-18 DIAGNOSIS — J9611 Chronic respiratory failure with hypoxia: Secondary | ICD-10-CM | POA: Diagnosis present

## 2018-08-18 DIAGNOSIS — N183 Chronic kidney disease, stage 3 (moderate): Secondary | ICD-10-CM | POA: Diagnosis present

## 2018-08-18 DIAGNOSIS — Z79899 Other long term (current) drug therapy: Secondary | ICD-10-CM | POA: Diagnosis not present

## 2018-08-18 DIAGNOSIS — N281 Cyst of kidney, acquired: Secondary | ICD-10-CM | POA: Diagnosis present

## 2018-08-18 DIAGNOSIS — E876 Hypokalemia: Secondary | ICD-10-CM | POA: Diagnosis present

## 2018-08-18 DIAGNOSIS — Z7984 Long term (current) use of oral hypoglycemic drugs: Secondary | ICD-10-CM | POA: Diagnosis not present

## 2018-08-18 DIAGNOSIS — N182 Chronic kidney disease, stage 2 (mild): Secondary | ICD-10-CM | POA: Diagnosis not present

## 2018-08-18 DIAGNOSIS — R7303 Prediabetes: Secondary | ICD-10-CM | POA: Diagnosis present

## 2018-08-18 DIAGNOSIS — R0902 Hypoxemia: Secondary | ICD-10-CM | POA: Diagnosis present

## 2018-08-18 DIAGNOSIS — F17290 Nicotine dependence, other tobacco product, uncomplicated: Secondary | ICD-10-CM | POA: Diagnosis present

## 2018-08-18 DIAGNOSIS — Z8701 Personal history of pneumonia (recurrent): Secondary | ICD-10-CM | POA: Diagnosis not present

## 2018-08-18 DIAGNOSIS — I272 Pulmonary hypertension, unspecified: Secondary | ICD-10-CM | POA: Diagnosis present

## 2018-08-18 DIAGNOSIS — R911 Solitary pulmonary nodule: Secondary | ICD-10-CM | POA: Diagnosis present

## 2018-08-18 DIAGNOSIS — J181 Lobar pneumonia, unspecified organism: Secondary | ICD-10-CM | POA: Diagnosis present

## 2018-08-18 DIAGNOSIS — R739 Hyperglycemia, unspecified: Secondary | ICD-10-CM | POA: Diagnosis present

## 2018-08-18 DIAGNOSIS — I10 Essential (primary) hypertension: Secondary | ICD-10-CM | POA: Diagnosis not present

## 2018-08-18 LAB — ANCA TITERS: C-ANCA: <1:20 {titer}

## 2018-08-18 LAB — LEGIONELLA PNEUMOPHILA SEROGP 1 UR AG: L. pneumophila Serogp 1 Ur Ag: NEGATIVE

## 2018-08-18 LAB — ANTINUCLEAR ANTIBODIES, IFA: ANA Ab, IFA: NEGATIVE

## 2018-08-18 MED ORDER — SODIUM CHLORIDE 0.9 % IV SOLN
INTRAVENOUS | Status: DC
  Administered 2018-08-18 – 2018-08-19 (×2): via INTRAVENOUS

## 2018-08-18 MED ORDER — IPRATROPIUM BROMIDE 0.02 % IN SOLN
0.5000 mg | Freq: Three times a day (TID) | RESPIRATORY_TRACT | Status: DC
  Administered 2018-08-18 – 2018-08-19 (×2): 0.5 mg via RESPIRATORY_TRACT
  Filled 2018-08-18 (×4): qty 2.5

## 2018-08-18 MED ORDER — GADOBUTROL 1 MMOL/ML IV SOLN
10.0000 mL | Freq: Once | INTRAVENOUS | Status: AC | PRN
  Administered 2018-08-18: 10 mL via INTRAVENOUS

## 2018-08-18 MED ORDER — LEVALBUTEROL HCL 0.63 MG/3ML IN NEBU
0.6300 mg | INHALATION_SOLUTION | Freq: Three times a day (TID) | RESPIRATORY_TRACT | Status: DC
  Administered 2018-08-18 – 2018-08-19 (×3): 0.63 mg via RESPIRATORY_TRACT
  Filled 2018-08-18 (×4): qty 3

## 2018-08-18 NOTE — Progress Notes (Signed)
SATURATION QUALIFICATIONS: (This note is used to comply with regulatory documentation for home oxygen)  Patient Saturations on Room Air at Rest = 93%  Patient Saturations on Room Air while Ambulating = 90%  Patient Saturations on 0 Liters of oxygen while Ambulating = 90%  Please briefly explain why patient needs home oxygen: pt refused oxygen while ambulating; MD aware of pt sats

## 2018-08-18 NOTE — Progress Notes (Signed)
NT ambulated with pt and and per report pt oxygen dropped to 85% RA whiles talking with ambulation but at 89% when quiet and not speaking during ambulation. Pt back to bed with oxygen at 91% RA; pt assessed to be on 2L oxygen in room; pt report oxygen dropping whiles walking so he put his oxygen back on. Will continue to closely monitor. Delia Heady RN

## 2018-08-18 NOTE — Progress Notes (Signed)
Progress Note    Casey [name], Dr.  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 Time line 1993: s/p lung biopsy for hemorraghic PNA- -? hanta virus October-- went to [place] November- dyspnea-- has V/Q negative and LE duplex (thromboplebitis) Saw outpatient pulm (plan was for PFTs) December- travelled to [facility]- went hiking -had cath- normal -xarelto stopped in anticipation of procedure January -episode of bronchitis/had to cancel PFTs -10 days of levaquin 500mg --- slightly improved after This past weekend/week-- progressive dyspnea to the point that Wednesday he was unable to walk to his car   Assessment/Plan:   Principal Problem:   Community acquired pneumonia of right lower lobe of lung ([place]) Active Problems:   Atrial fibrillation ([place])   Hypertension   Hyperaldosteronism ([place])   GERD (gastroesophageal reflux disease)   CKD (chronic kidney disease), stage II   Hyperlipidemia   Acute respiratory failure (HCC)  Acute respiratory failure -drops to 83% on RA -room air saturations have improved but patient is still dropping with ambulation into the mid 80s-- he is also very symptomatic  Right lower lobe nodular mass-like density: -Seen on CT chest without contrast. -Currently treating as pneumonia given constellation of symptoms, however neoplasm is not ruled out.   -? PE as has been off xarelto but appears to be a slower progression--- not sudden - IV Zosyn change to PO augmentin -denies aspiration-- does have GERD -Resume home Xarelto--  not sure there would be reason.benefit to increase xarelto to BID as treatment for presumed PE (duplex in NOv with only superficial thrombophlebitis) -Supplemental oxygen as needed and wean off as able  Renal cysts -will get MRI done inpatient as patient has plans for stimulator to be placed and then will be unable to have MRIs -follows with Dr. Alinda Money -IVF -labs in AM  Atrial fibrillation status post ablation: -Continue Xarelto -Continue diltiazem  Hypertension/hyperaldosteronism: Blood pressure currently stable. -Continue home diltiazem, losartan, eplerenone  -hold hctz for now  CKD stage II: Currently stable, continue to monitor.  Hyperlipidemia: Continue atorvastatin.  GERD: Continue Protonix.   Family Communication/Anticipated D/C date and plan/Code Status   DVT prophylaxis: xarelto Code Status: Full Code.  Family Communication: wife at bedside Disposition Plan: pending ability to wean off O2   Medical Consultants:    Pulm     Subjective:   O2 sats went down while ambulating-- appears symptomatic  Objective:    Vitals:   08/18/18 0242 08/18/18 0559 08/18/18 0919 08/18/18 1300  BP:  117/70  128/81  Pulse: 68 85 93 (!) 101  Resp: 16 16 18 18   Temp:  97.9 F (36.6 C)  97.8 F (36.6 C)  TempSrc:  Oral  Oral  SpO2: 95% 96% 91% 95%  Weight:  105.6 kg    Height:        Intake/Output Summary (Last 24 hours) at 08/18/2018 1431 Last data filed at 08/18/2018 1200 Gross per 24 hour  Intake 240 ml  Output 425 ml  Net -185 ml   Filed Weights   08/17/18 1918 08/18/18 0559  Weight: 108 kg 105.6 kg  Exam: Walking in room-- appears dyspneic with speaking rrr No wheezing, essentially clear +BS, soft No LE edema  Data Reviewed:   I have personally reviewed following labs and imaging studies:  Labs: Labs show the following:   Basic Metabolic Panel: Recent Labs  Lab 08/16/18 1815 08/17/18 0253  NA 138 139  K 3.4* 3.6  CL 103 104    CO2 24 25  GLUCOSE 112* 93  BUN 27* 26*  CREATININE 1.68* 1.66*  CALCIUM 9.0 8.6*   GFR Estimated Creatinine Clearance: 57.4 mL/min (A) (by C-G formula based on SCr of 1.66 mg/dL (H)). Liver Function Tests: No results for input(s): AST, ALT, ALKPHOS, BILITOT, PROT, ALBUMIN in the last 168 hours. No results for input(s): LIPASE, AMYLASE in the last 168 hours. No results for input(s): AMMONIA in the last 168 hours. Coagulation profile No results for input(s): INR, PROTIME in the last 168 hours.  CBC: Recent Labs  Lab 08/16/18 1815 08/17/18 0253  WBC 13.5* 10.0  NEUTROABS 10.1*  --   HGB 13.7 12.9*  HCT 42.7 40.8  MCV 87.7 86.8  PLT 239 208   Cardiac Enzymes: No results for input(s): CKTOTAL, CKMB, CKMBINDEX, TROPONINI in the last 168 hours. BNP (last 3 results) No results for input(s): PROBNP in the last 8760 hours. CBG: No results for input(s): GLUCAP in the last 168 hours. D-Dimer: No results for input(s): DDIMER in the last 72 hours. Hgb A1c: No results for input(s): HGBA1C in the last 72 hours. Lipid Profile: No results for input(s): CHOL, HDL, LDLCALC, TRIG, CHOLHDL, LDLDIRECT in the last 72 hours. Thyroid function studies: No results for input(s): TSH, T4TOTAL, T3FREE, THYROIDAB in the last 72 hours.  Invalid input(s): FREET3 Anemia work up: No results for input(s): VITAMINB12, FOLATE, FERRITIN, TIBC, IRON, RETICCTPCT in the last 72 hours. Sepsis Labs: Recent Labs  Lab 08/16/18 1815 08/17/18 0253  WBC 13.5* 10.0    Microbiology Recent Results (from the past 240 hour(s))  Respiratory Panel by PCR     Status: None   Collection Time: 08/16/18  5:19 PM  Result Value Ref Range Status   Adenovirus NOT DETECTED NOT DETECTED Final   Coronavirus 229E NOT DETECTED NOT DETECTED Final   Coronavirus HKU1 NOT DETECTED NOT DETECTED Final   Coronavirus NL63 NOT DETECTED NOT DETECTED Final   Coronavirus OC43 NOT DETECTED NOT DETECTED Final   Metapneumovirus NOT  DETECTED NOT DETECTED Final   Rhinovirus / Enterovirus NOT DETECTED NOT DETECTED Final   Influenza A NOT DETECTED NOT DETECTED Final   Influenza B NOT DETECTED NOT DETECTED Final   Parainfluenza Virus 1 NOT DETECTED NOT DETECTED Final   Parainfluenza Virus 2 NOT DETECTED NOT DETECTED Final   Parainfluenza Virus 3 NOT DETECTED NOT DETECTED Final   Parainfluenza Virus 4 NOT DETECTED NOT DETECTED Final   Respiratory Syncytial Virus NOT DETECTED NOT DETECTED Final   Bordetella pertussis NOT DETECTED NOT DETECTED Final   Chlamydophila pneumoniae NOT DETECTED NOT DETECTED Final   Mycoplasma pneumoniae NOT DETECTED NOT DETECTED Final    Comment: Performed at [facility], [place] [address] [place], [place] [address]    Procedures and diagnostic studies:  Ct Chest Wo Contrast  Result Date: 08/16/2018 CLINICAL DATA:  63 year old male with acute respiratory illness. EXAM: CT CHEST WITHOUT CONTRAST TECHNIQUE: Multidetector CT imaging of the chest was performed following the standard protocol without IV contrast. COMPARISON:  Chest radiograph dated 08/16/2018 and chest CT dated 09/18/2012 FINDINGS: Evaluation of this exam is  limited in the absence of intravenous contrast. Cardiovascular: There is no cardiomegaly or pericardial effusion. There is coronary vascular calcification. Mild atherosclerotic calcification of the thoracic aorta. There is mild dilatation of the main pulmonary trunk suggestive of pulmonary hypertension. Clinical correlation is recommended. Mediastinum/Nodes: There is no hilar or mediastinal adenopathy. The esophagus is grossly unremarkable. No mediastinal fluid collection. Lungs/Pleura: There is a 3.3 x 1.8 cm focal nodular masslike density at the right lung base posteriorly along the posterior costophrenic angle (series 4, image 136). This is new since the CT of 2014. Although this may represent a focal area of infection, a neoplastic process is not excluded. If there is clinical  signs of infection follow-up after treatment recommended. If there is no clinical findings of infection or this area persist after treatment, tissue sampling may be warranted. There is a 7 mm nodule in the left upper lobe. Left lung base linear scarring noted. There is no pleural effusion or pneumothorax. The central airways are patent. Upper Abdomen: Bilateral renal cysts which are partially visualized and not characterized on this CT. The right renal cysts may have solid component. Further characterization with MRI without and with contrast on a nonemergent basis recommended. Musculoskeletal: There is degenerative changes of the spine. Old left posterior rib fractures. Lower cervical ACDF. No acute osseous pathology. IMPRESSION: 1. Focal nodular masslike density at the right lung base may represent focal infection or neoplasm. Clinical correlation and close follow-up to resolution recommended. If this area persists, tissue sampling may be wanted. 2. A 7 mm left upper lobe pulmonary nodule. 3. Bilateral renal cysts. Further characterization with MRI without and with contrast on a nonemergent basis recommended. Electronically Signed   By: Anner Crete M.D.   On: 08/16/2018 19:50   Dg Chest Port 1 View  Result Date: 08/16/2018 CLINICAL DATA:  Dyspnea EXAM: PORTABLE CHEST 1 VIEW COMPARISON:  None. FINDINGS: Stable mild cardiac enlargement with aortic atherosclerosis. No acute pulmonary consolidation or edema. No effusion or pneumothorax. Chronic left posterior sixth rib fracture with healing. Partially included ACDF hardware without complicating features at the base of the cervical spine. The visualized skeletal structures are unremarkable. IMPRESSION: No active disease. Electronically Signed   By: [place] Royalty M.D.   On: 08/16/2018 18:24    Medications:   . allopurinol  100 mg Oral BID  . amoxicillin-clavulanate  1 tablet Oral BID  . atorvastatin  10 mg Oral QPM  . budesonide  0.5 mg Inhalation BID  .  diltiazem  360 mg Oral QPM  . eplerenone  50 mg Oral BID  . hydrochlorothiazide  12.5 mg Oral QPM  . ipratropium  0.5 mg Nebulization Q8H  . levalbuterol  0.63 mg Nebulization Q8H  . losartan  100 mg Oral QPM  . oxybutynin  10 mg Oral Daily  . pantoprazole  40 mg Oral QAC supper  . potassium chloride  10 mEq Oral Daily  . [START ON 08/23/2018] predniSONE  10 mg Oral Q breakfast  . [START ON 08/21/2018] predniSONE  20 mg Oral Q breakfast  . [START ON 08/19/2018] predniSONE  30 mg Oral Q breakfast  . rivaroxaban  20 mg Oral Q supper  . sertraline  50 mg Oral QPM  . vitamin B-12  1,000 mcg Oral Daily   Continuous Infusions: . sodium chloride       LOS: 0 days   Geradine Girt  [facility]   *Please refer to [email or web address], password [email or web address] to get updated schedule on who  will round on this patient, as hospitalists switch teams weekly. If 7PM-7AM, please contact night-coverage at [email or web address], password [email or web address] for any overnight needs.  08/18/2018, 2:31 PM

## 2018-08-18 NOTE — Progress Notes (Signed)
MRI called RN to inform her pt has to be NPO; pt clarified he had lunch and last liquid was around 3p. MRI informed and RN was informed pt should be NPO 4-6hrs. Pt and spouse at bedside informed and voices understanding. Will continue to closely monitor. Delia Heady RN

## 2018-08-18 NOTE — Progress Notes (Signed)
NAME:  Casey [name], Dr., MRN:  [id number], DOB:  05-28-1956, LOS: 0 ADMISSION DATE:  08/16/2018, CONSULTATION DATE:  08/17/18 REFERRING MD:  Dr. Eliseo Squires, CHIEF COMPLAINT:  Cough, SOB, Chest heaviness   Brief History   63 y/o M admitted with cough & SOB.  Found to be hypoxic with saturations to 87%.  History of present illness   63 y/o M admitted 1/22 with reports of shortness of breath, cough, nasal drainage and chest heaviness.    In the past, he was followed by Dr. Arlyce Dice for left pleural mass (2011) which was biopsied and found to be a benign schwannoma and he underwent resection via VATS.  He is an occasional cigar smoker.  Medication review does show ARB use.  He is intolerant to ACE-I inhibitors due to cough.     He was seen in the ER on 11/6 with complaints of shortness of breath.  At that time, he had recently traveled to [place].  RLE venous duplex showed a superficial thrombophlebitis.  He had a break in his anticoagulation with his cardiologist's approval after cardioversion.  With concerns for LE pain, he restarted the Xarelto. VQ scan was performed at that time (11/6) which was negative for ventilation / perfusion defect.  He followed up with Dr. Valeta Harms on 11/25 for cough & SOB.  At that time, PFT's were ordered with plan of possible cardiopulmonary stress testing.  In the course of his illness, he completed two courses of levaquin.  He followed up with Cardiology on 12/9 and was ordered for ECHO, stress testing and LHC. ECHO 12/24 showed an LVEF 50-55%, normal wall motion.  He had exercise stress testing 12/24 which was negative for ST segment deviation.  LHC on 12/31 was negative for ischemic diease.  He was unable to complete PFT's 08/01/18 due to acute illness.     He returned to the ER on 1/22 with reports of ongoing SOB.  He reported since his LHC he had been experiencing chest heaviness and progressive SOB.  He went to work on 1/22 and felt as though he could not breathe by the end of  the day.  Tried to walk to the car but was so SOB he had difficulty.  CT of the chest w/o contrast was obtained which demonstrated mild dilation of the main pulmonary trunk suggestive of pulmonary HTN, focal nodular masslike density at the right lung base (3.3x1.8 cm), mild thickening of the airways, 12m LUL pulmonary nodule, bilateral renal cysts.    PCCM consulted for evaluation.   He reports his wife was recently sick with bronchitis.  Reports he initially felt better after first round of abx but then developed worsening symptoms.  Exposure review includes - potential repeated exposures to amphotericin nebulization at work. Has had cats most of his life but none in the last few years.  States recent fever to 102 at home.     Past Medical History  AF - paroxysmal  Superficial Thrombophlebitis - 2019  GERD Hyperaldosteronism  Chronically elevated left hemidiaphragm HTN Renal cysts Renal insufficiency - sr cr 1.6 Pre-diabetes  Significant Hospital Events   1/22  Admit   Consults:  1/23 PCCM   Procedures:    Significant Diagnostic Tests:  CT Chest w/o 1/22 >> mild dilation of the main pulmonary trunk suggestive of pulmonary HTN, focal nodular masslike density at the right lung base (3.3x1.8 cm), mild thickening of the airways, 781mLUL pulmonary nodule, bilateral renal cysts   Micro Data:  HIV 1/23 >> negative  U. Strep 1/23 >> negative U. Legionella 1/23 >> negative CRP 1/23 >> 4.4 (chronically elevated per pt) ESR 1/23 >> 13 ANCA 1/23 >>  ANA 1/24 >> negative IGE 1/23 >>  RAST 1/23 >> Quantiferon gold 1/24 >> RVP 1/23 >> negative   Antimicrobials:  Zoysn 1/22 >> 1/23 Augmentin 1/23 >>  Interim history/subjective:  Ambulated with RT on RA, sats >90%  Objective   Blood pressure 117/70, pulse 93, temperature 97.9 F (36.6 C), temperature source Oral, resp. rate 18, height '6\' 1"'$  (1.854 m), weight 105.6 kg, SpO2 91 %.        Intake/Output Summary (Last 24 hours) at  08/18/2018 1334 Last data filed at 08/18/2018 1200 Gross per 24 hour  Intake 240 ml  Output 425 ml  Net -185 ml   Filed Weights   08/17/18 1918 08/18/18 0559  Weight: 108 kg 105.6 kg    Examination: General:   HEENT: MM pink/moist Neuro:  CV: s1s2 rrr, no m/r/g PULM: even/non-labored, lungs bilaterally  XH:BZJI, non-tender, bsx4 active  Extremities: warm/dry, edema  Skin: no rashes or lesions  Resolved Hospital Problem list     Assessment & Plan:   Acute Hypoxemic Respiratory Failure  -suspect multifactorial in setting of recent URI / illness, possible undiagnosed reactive airway disease, ? Pulmonary hypertension (noted enlarged PA's on CT), possible rounded atelectasis (vs mass) in RLL, hypersensitivity / exposures and undiagnosed sleep apnea.  Recent VQ negative for PE and he has been on anticoagulation, makes PE less likely.    P: Continue prednisone taper  Follow up autoimmune panel as above  Continue xopenex, atrovent + pulmicort  Plan to discharge with PRN albuterol  May need a RHC at some point to follow up on enlarged PA Consider PFT assessment once clinically improved    LUL Pulmonary Nodule RLL Nodular Mass-Like Opacity  P: Plan for outpatient follow up with Dr. Valeta Harms.  Will need repeat HRCT with inspiratory / expiratory cuts May need PET-CT, biopsy pending follow up imaging Complete augmentin for CAP  Best practice:  Diet: As tolerated  Pain/Anxiety/Delirium protocol (if indicated): n/a VAP protocol (if indicated): n/a  DVT prophylaxis: xarelto  GI prophylaxis: ppi  Glucose control: per primary  Mobility: as tolerated  Code Status: Full code  Family Communication: Patient updated on plan of care  Disposition: Floor / TRH   Labs   CBC: Recent Labs  Lab 08/16/18 1815 08/17/18 0253  WBC 13.5* 10.0  NEUTROABS 10.1*  --   HGB 13.7 12.9*  HCT 42.7 40.8  MCV 87.7 86.8  PLT 239 967    Basic Metabolic Panel: Recent Labs  Lab 08/16/18 1815  08/17/18 0253  NA 138 139  K 3.4* 3.6  CL 103 104  CO2 24 25  GLUCOSE 112* 93  BUN 27* 26*  CREATININE 1.68* 1.66*  CALCIUM 9.0 8.6*   GFR: Estimated Creatinine Clearance: 57.4 mL/min (A) (by C-G formula based on SCr of 1.66 mg/dL (H)). Recent Labs  Lab 08/16/18 1815 08/17/18 0253  WBC 13.5* 10.0    Liver Function Tests: No results for input(s): AST, ALT, ALKPHOS, BILITOT, PROT, ALBUMIN in the last 168 hours. No results for input(s): LIPASE, AMYLASE in the last 168 hours. No results for input(s): AMMONIA in the last 168 hours.  ABG    Component Value Date/Time   PHART 7.420 01/23/2010 0456   PCO2ART 44.9 01/23/2010 0456   PO2ART 78.0 (L) 01/23/2010 0456   HCO3 29.0 (H) 01/23/2010  0456   TCO2 29 08/01/2010 1341   O2SAT 95.0 01/23/2010 0456     Coagulation Profile: No results for input(s): INR, PROTIME in the last 168 hours.  Cardiac Enzymes: No results for input(s): CKTOTAL, CKMB, CKMBINDEX, TROPONINI in the last 168 hours.  HbA1C: No results found for: HGBA1C  CBG: No results for input(s): GLUCAP in the last 168 hours.   Critical care time: n/a     Noe Gens, NP-C [facility] Pgr: [phone] or if no answer [phone] 08/18/2018, 1:34 PM  __________________________________________________________________________    Subjective: This is a 63 y.o., male admitted on 08/16/2018 for hypoxemia, shortness of breath.  Patient desaturate to 85% up with ambulation in the hallway today.  Still requiring O2.  Patient does state he feels somewhat better.  Long discussion with patient and wife at bedside.  Objective: BP 128/81 (BP Location: Right Arm)   Pulse 81   Temp 97.8 F (36.6 C) (Oral)   Resp 14   Ht '6\' 1"'$  (1.854 m)   Wt 105.6 kg   SpO2 95%   BMI 30.71 kg/m    Intake/Output Summary (Last 24 hours) at 08/18/2018 1636 Last data filed at 08/18/2018 1538 Gross per 24 hour  Intake 51.06 ml  Output 425 ml  Net -373.94 ml       Physical Examination: General appearance: 63 y.o., male, NAD, conversant  Eyes: anicteric sclerae, moist conjunctivae; no lid-lag; PERRLA, tracking appropriately HENT: NCAT; oropharynx, MMM, no mucosal ulcerations; normal hard and soft palate Neck: Trachea midline; FROM, supple, lymphadenopathy, no JVD Lungs: few crackles in right base, no wheeze  CV: RRR, S1, S2, no MRGs  Abdomen: Soft, non-tender; non-distended, BS present  Extremities: No peripheral edema, radial and DP pulses present bilaterally  Skin: Normal temperature, turgor and texture; no rash Psych: Appropriate affect Neuro: Alert and oriented to person and place, no focal deficit   Labs: Respiratory viral panel negative C-reactive protein mildly elevated ESR negative ANCA pending ANA negative. Aero allergen panel and IgE pending Hypersensitivity pneumonitis panel pending  Chest Imaging:   CT chest 08/16/2018: Small focal nodular lesion within the right lung base.  7 mm left upper lobe pulmonary nodule bilateral renal cysts The patient's images have been independently reviewed by me.    Assessment: Acute hypoxemic respiratory failure requiring nasal cannula O2 supplementation. Recurrent upper respiratory exacerbations possible asthma-like syndrome Would be concerned for small airway disease and recurrent environmental exposure that may be triggering these episodic symptoms over the past 6 months.  One must consider other reasons for dyspnea on exertion with hypoxemia to include shunt, intracardiac or otherwise as well as pulmonary hypertension.  Prior echocardiogram with no real significant evidence of pH however his CT findings did show an enlarged pulmonary artery.  Does have a focal right lower lobe nodule as well as a left upper lobe nodular density.  Plan: Outpatient follow-up in pulmonary clinic. We will have this scheduled with me in the next week and a half Continue steroid taper. Continue scheduled  bronchodilators Discussed care with cardiology.  Will review echo images and consider utility of repeat limited echo with bubble study. At this point I feel like we are having to eliminate etiologies of hypoxemia with exertion. Patient needs full pulmonary function test Pending PFTs I think he would benefit from high-resolution CT imaging with inspiratory and expiratory prone and supine imaging. As for the right lower lobe density if this does not disappear or resolve on its own then  we need to consider pet imaging as an outpatient. We will also have patient follow-up with his cardiologist upon discharge.  Garner Nash, DO  Pulmonary Critical Care 08/18/2018 4:36 PM  Personal pager: [phone] If unanswered, please page CCM On-call: [phone]

## 2018-08-18 NOTE — Progress Notes (Signed)
Pt transferred to unit [place] and report called off to RN on the unit. Pt transported off unit via wheelchair with spouse and belongings to the side. Delia Heady RN

## 2018-08-19 DIAGNOSIS — N183 Chronic kidney disease, stage 3 (moderate): Secondary | ICD-10-CM

## 2018-08-19 DIAGNOSIS — R739 Hyperglycemia, unspecified: Secondary | ICD-10-CM

## 2018-08-19 LAB — MPO/PR-3 (ANCA) ANTIBODIES
ANCA Proteinase 3: <3.5 U/mL (ref 0.0–3.5)
Myeloperoxidase Abs: <9 U/mL (ref 0.0–9.0)

## 2018-08-19 LAB — BASIC METABOLIC PANEL
Anion gap: 12 (ref 5–15)
BUN: 28 mg/dL — ABNORMAL HIGH (ref 8–23)
CHLORIDE: 105 mmol/L (ref 98–111)
CO2: 21 mmol/L — ABNORMAL LOW (ref 22–32)
Calcium: 9.2 mg/dL (ref 8.9–10.3)
Creatinine, Ser: 1.53 mg/dL — ABNORMAL HIGH (ref 0.61–1.24)
GFR calc Af Amer: 55 mL/min — ABNORMAL LOW (ref >60)
GFR calc non Af Amer: 47 mL/min — ABNORMAL LOW (ref >60)
Glucose, Bld: 144 mg/dL — ABNORMAL HIGH (ref 70–99)
Potassium: 3.2 mmol/L — ABNORMAL LOW (ref 3.5–5.1)
SODIUM: 138 mmol/L (ref 135–145)

## 2018-08-19 MED ORDER — POTASSIUM CHLORIDE CRYS ER 20 MEQ PO TBCR
40.0000 meq | EXTENDED_RELEASE_TABLET | Freq: Once | ORAL | Status: AC
  Administered 2018-08-19: 40 meq via ORAL
  Filled 2018-08-19: qty 2

## 2018-08-19 MED ORDER — ALBUTEROL SULFATE HFA 108 (90 BASE) MCG/ACT IN AERS: 2.0000 | INHALATION_SPRAY | Freq: Four times a day (QID) | RESPIRATORY_TRACT | 2 refills | Status: DC | PRN

## 2018-08-19 MED ORDER — RIVAROXABAN 20 MG PO TABS: ORAL_TABLET | ORAL | 0 refills | Status: DC

## 2018-08-19 MED ORDER — AMOXICILLIN-POT CLAVULANATE 875-125 MG PO TABS: 1.0000 | ORAL_TABLET | Freq: Two times a day (BID) | ORAL | 0 refills | Status: DC

## 2018-08-19 MED ORDER — PREDNISONE 10 MG PO TABS: ORAL_TABLET | ORAL | 0 refills | Status: DC

## 2018-08-19 NOTE — Evaluation (Signed)
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 Tried him on 2L at first, sats still 89% with activity. Increased to 3L for sat of 91%. He did not complain of dyspnea while walking and was able to speak. He states he doesn't particularly want home O2, but if it will expedite discharge, he is agreeable.

## 2018-08-19 NOTE — Discharge Summary (Signed)
Physician Discharge Summary  Casey [name], Dr. NWG:[id number] DOB: 08-01-1955 DOA: 08/16/2018  PCP: Casey Cha, MD  Admit date: 08/16/2018 Discharge date: 08/19/2018  Admitted From: home Discharge disposition: home   Recommendations for Outpatient Follow-Up:   Multiple labs pending- qunatiferon gold, hypersensitivity pneumonitis, allergens  Needs follow up with 1. pulm for PFTs, consideration of PET if area in lung not resolved after abx, as well as high resolution CT scan  2. Cardiology: ? Need for RHC to measure pulmonary pressures 3. Urology for MRI results showing b/l cysts on kidneys  BMP 1 week  Discharge Diagnosis:   Principal Problem:   Community acquired pneumonia of right lower lobe of lung ([place]) Active Problems:   Atrial fibrillation ([place])   Hypertension   Hyperaldosteronism ([facility])   GERD (gastroesophageal reflux disease)   CKD (chronic kidney disease), stage II   Hyperlipidemia   Acute respiratory failure ([place])   Acute respiratory failure with hypoxia ([place])    Discharge Condition: Improved.  Diet recommendation: Low sodium, heart healthy.  Carbohydrate-modified.  Wound care: None.  Code status: Full.   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  Patient reports travel to [place] in October 2019.  He had noticed swelling of his right leg and upon returning discussed with his PCP and had a RLE ultrasound completed 05/30/2018 which did not show a DVT but did show a superficial thrombophlebitis.  He had previously discontinued Xarelto after discussion with his cardiologist due to chadsvasc score of 1, which he subsequently resumed.  He went to the ED on  05/31/2018 for further evaluation.  A VQ scan was obtained which did not show evidence of focal ventilation or perfusion defect and was low probability for PE.  He was diagnosed and treated for bronchitis.  He had intermittent improvement.  He was seen by pulmonology, Dr. Valeta Harms, who ordered PFTs however was unable to complete them due to feeling unwell again.  He has since completed a 10-day course of Levaquin.  He was also evaluated by cardiology.  07/18/2018 echocardiogram showed EF 50-55% without regional wall abnormality and normal LV diastolic function parameters.  He completed an exercise tolerance test with hypertensive response to exercise.  His baseline EKG showed old infarction in the anterior lateral and inferior regions and he was therefore scheduled for a left heart cath.  LHC on 07/25/2018 was without any significant coronary artery disease.  The weekend prior to this admission began to feel unwell again with shortness of breath.  And diaphoresis and a fever at home 2 days prior to admission.  He returned to work and noted significant dyspnea with exertion and occasionally with rest.  He had associated tachycardia without feeling prior atrial fibrillation symptoms and an associated nonproductive cough.  His symptoms worsen when walking to the car to the point that he brought himself to the emergency department.  He denies any significant tobacco history.  Reports a history of hantavirus years ago but denies any known history of asthma or COPD.  He reports recent travel to hike in [place] in mid December 2019.   Hospital Course by Problem:   Acute respiratory failure -resolved -last home O2 study with ambulation was documented as a drop  to 8 but this was brief and returned to 90 w/o intervention and Dr. [name] was completely asymptomatic -steroid taper -nebs -is back on xarelto as well (had been held for possible urologic procedure)  new pleural-based density -plan is to treat  as if there is an infectious cause with PO abx (augmentin) -outpatient pulm follow up with Dr. Valeta Harms for high res CT, ? PET is area in question is still present -he will also need PFTs  ?pulmonary HTN -?never had RHC- to follow up with cardiology -no prior mention of PFO on TEE so unlikely to have shunt (? Need for echo with bubble study)  Hyperglycemia -strict diet -suspect will improve when off steroids  Cysts on kidneys b/l -does not seem to carry the PCKD diagnosis -MRI information sent to Dr. Alinda Money with whom patient follow up  GERD -PPI  CKD stage III -outpateint follow up  Hypokalemia -repleted  Medical Consultants:      Discharge Exam:   Vitals:   08/19/18 0817 08/19/18 0926  BP: 120/75   Pulse: 62   Resp: 12   Temp: (!) 97.3 F (36.3 C)   SpO2: 99% 94%   Vitals:   08/19/18 0038 08/19/18 0614 08/19/18 0817 08/19/18 0926  BP: 120/86  120/75   Pulse:   62   Resp:   12   Temp:   (!) 97.3 F (36.3 C)   TempSrc:   Oral   SpO2:  99% 99% 94%  Weight:      Height:        General exam: Appears calm and comfortable. MUCH less winded   The results of significant diagnostics from this hospitalization (including imaging, microbiology, ancillary and laboratory) are listed below for reference.     Procedures and Diagnostic Studies:   Ct Chest Wo Contrast  Result Date: 08/16/2018 CLINICAL DATA:  63 year old male with acute respiratory illness. EXAM: CT CHEST WITHOUT CONTRAST TECHNIQUE: Multidetector CT imaging of the chest was performed following the standard protocol without IV contrast. COMPARISON:  Chest radiograph dated 08/16/2018 and chest CT dated 09/18/2012 FINDINGS: Evaluation of this exam is limited in the absence of intravenous contrast. Cardiovascular: There is no cardiomegaly or pericardial effusion. There is coronary vascular calcification. Mild atherosclerotic calcification of the thoracic aorta. There is mild dilatation of the main pulmonary  trunk suggestive of pulmonary hypertension. Clinical correlation is recommended. Mediastinum/Nodes: There is no hilar or mediastinal adenopathy. The esophagus is grossly unremarkable. No mediastinal fluid collection. Lungs/Pleura: There is a 3.3 x 1.8 cm focal nodular masslike density at the right lung base posteriorly along the posterior costophrenic angle (series 4, image 136). This is new since the CT of 2014. Although this may represent a focal area of infection, a neoplastic process is not excluded. If there is clinical signs of infection follow-up after treatment recommended. If there is no clinical findings of infection or this area persist after treatment, tissue sampling may be warranted. There is a 7 mm nodule in the left upper lobe. Left lung base linear scarring noted. There is no pleural effusion or pneumothorax. The central airways are patent. Upper Abdomen: Bilateral renal cysts which are partially visualized and not characterized on this CT. The right renal cysts may have solid component. Further characterization with MRI without and with contrast on a nonemergent basis recommended. Musculoskeletal: There is degenerative changes of the spine. Old left posterior rib fractures. Lower cervical ACDF. No acute osseous pathology. IMPRESSION: 1. Focal nodular masslike density at the right lung base  may represent focal infection or neoplasm. Clinical correlation and close follow-up to resolution recommended. If this area persists, tissue sampling may be wanted. 2. A 7 mm left upper lobe pulmonary nodule. 3. Bilateral renal cysts. Further characterization with MRI without and with contrast on a nonemergent basis recommended. Electronically Signed   By: Anner Crete M.D.   On: 08/16/2018 19:50   Dg Chest Port 1 View  Result Date: 08/16/2018 CLINICAL DATA:  Dyspnea EXAM: PORTABLE CHEST 1 VIEW COMPARISON:  None. FINDINGS: Stable mild cardiac enlargement with aortic atherosclerosis. No acute pulmonary  consolidation or edema. No effusion or pneumothorax. Chronic left posterior sixth rib fracture with healing. Partially included ACDF hardware without complicating features at the base of the cervical spine. The visualized skeletal structures are unremarkable. IMPRESSION: No active disease. Electronically Signed   By: Ashley Royalty M.D.   On: 08/16/2018 18:24     Labs:   Basic Metabolic Panel: Recent Labs  Lab 08/16/18 1815 08/17/18 0253 08/19/18 0257  NA 138 139 138  K 3.4* 3.6 3.2*  CL 103 104 105  CO2 24 25 21*  GLUCOSE 112* 93 144*  BUN 27* 26* 28*  CREATININE 1.68* 1.66* 1.53*  CALCIUM 9.0 8.6* 9.2   GFR Estimated Creatinine Clearance: 62.2 mL/min (A) (by C-G formula based on SCr of 1.53 mg/dL (H)). Liver Function Tests: No results for input(s): AST, ALT, ALKPHOS, BILITOT, PROT, ALBUMIN in the last 168 hours. No results for input(s): LIPASE, AMYLASE in the last 168 hours. No results for input(s): AMMONIA in the last 168 hours. Coagulation profile No results for input(s): INR, PROTIME in the last 168 hours.  CBC: Recent Labs  Lab 08/16/18 1815 08/17/18 0253  WBC 13.5* 10.0  NEUTROABS 10.1*  --   HGB 13.7 12.9*  HCT 42.7 40.8  MCV 87.7 86.8  PLT 239 208   Cardiac Enzymes: No results for input(s): CKTOTAL, CKMB, CKMBINDEX, TROPONINI in the last 168 hours. BNP: Invalid input(s): POCBNP CBG: No results for input(s): GLUCAP in the last 168 hours. D-Dimer No results for input(s): DDIMER in the last 72 hours. Hgb A1c No results for input(s): HGBA1C in the last 72 hours. Lipid Profile No results for input(s): CHOL, HDL, LDLCALC, TRIG, CHOLHDL, LDLDIRECT in the last 72 hours. Thyroid function studies No results for input(s): TSH, T4TOTAL, T3FREE, THYROIDAB in the last 72 hours.  Invalid input(s): FREET3 Anemia work up No results for input(s): VITAMINB12, FOLATE, FERRITIN, TIBC, IRON, RETICCTPCT in the last 72 hours. Microbiology Recent Results (from the past 240  hour(s))  Respiratory Panel by PCR     Status: None   Collection Time: 08/16/18  5:19 PM  Result Value Ref Range Status   Adenovirus NOT DETECTED NOT DETECTED Final   Coronavirus 229E NOT DETECTED NOT DETECTED Final   Coronavirus HKU1 NOT DETECTED NOT DETECTED Final   Coronavirus NL63 NOT DETECTED NOT DETECTED Final   Coronavirus OC43 NOT DETECTED NOT DETECTED Final   Metapneumovirus NOT DETECTED NOT DETECTED Final   Rhinovirus / Enterovirus NOT DETECTED NOT DETECTED Final   Influenza A NOT DETECTED NOT DETECTED Final   Influenza B NOT DETECTED NOT DETECTED Final   Parainfluenza Virus 1 NOT DETECTED NOT DETECTED Final   Parainfluenza Virus 2 NOT DETECTED NOT DETECTED Final   Parainfluenza Virus 3 NOT DETECTED NOT DETECTED Final   Parainfluenza Virus 4 NOT DETECTED NOT DETECTED Final   Respiratory Syncytial Virus NOT DETECTED NOT DETECTED Final   Bordetella pertussis NOT DETECTED NOT DETECTED  Final   Chlamydophila pneumoniae NOT DETECTED NOT DETECTED Final   Mycoplasma pneumoniae NOT DETECTED NOT DETECTED Final    Comment: Performed at [facility], [place] [address], [place], [place] [address]     Discharge Instructions:   Discharge Instructions    Diet - low sodium heart healthy   Complete by:  As directed    Diet Carb Modified   Complete by:  As directed    Discharge instructions   Complete by:  As directed    Will need close follow up with Dr. Valeta Harms-- appointment made Also follow up with cardiology (you have an appointment with Dr. Caryl Comes in the upcoming week) MRI was sent to Dr. Alinda Money Slowly return to your daily activities Return to ER for any worsening of your symptoms BMP at next office visit The prednisone is causing your sugars to be high-- monitor diet closely, avoiding foods that could elevate blood sugars   Increase activity slowly   Complete by:  As directed      Allergies as of 08/19/2018      Reactions   Aldactone [spironolactone]    Breast tissue  enlargement   Codeine Other (See Comments)   Headache   Lisinopril Cough   Nexium [esomeprazole Magnesium] Diarrhea   Sulfa Antibiotics Rash   Sulfa Drugs Cross Reactors Rash      Medication List    STOP taking these medications   hydrochlorothiazide 12.5 MG capsule Commonly known as:  MICROZIDE   levofloxacin 500 MG tablet Commonly known as:  LEVAQUIN   metFORMIN 500 MG 24 hr tablet Commonly known as:  GLUCOPHAGE-XR     TAKE these medications   albuterol 108 (90 Base) MCG/ACT inhaler Commonly known as:  PROVENTIL HFA;VENTOLIN HFA Inhale 2 puffs into the lungs every 6 (six) hours as needed for wheezing or shortness of breath.   allopurinol 100 MG tablet Commonly known as:  ZYLOPRIM Take 100 mg by mouth 2 (two) times daily.   amoxicillin-clavulanate 875-125 MG tablet Commonly known as:  AUGMENTIN Take 1 tablet by mouth 2 (two) times daily.   atorvastatin 10 MG tablet Commonly known as:  LIPITOR Take 10 mg by mouth every evening.   beclomethasone 80 MCG/ACT inhaler Commonly known as:  QVAR Inhale 1 puff into the lungs 2 (two) times daily.   CIALIS 20 MG tablet Generic drug:  tadalafil Take 20 mg by mouth daily as needed for erectile dysfunction.   diltiazem 360 MG 24 hr capsule Commonly known as:  CARDIZEM CD Take 360 mg by mouth every evening.   eplerenone 50 MG tablet Commonly known as:  INSPRA Take 50 mg by mouth 2 (two) times daily.   FIBER ADULT GUMMIES PO Take 2 tablets by mouth 2 (two) times daily.   ibuprofen 200 MG tablet Commonly known as:  ADVIL,MOTRIN Take 200-400 mg by mouth daily as needed for headache or moderate pain.   loperamide 2 MG tablet Commonly known as:  IMODIUM A-D Take 2 mg by mouth 2 (two) times daily.   losartan 100 MG tablet Commonly known as:  COZAAR Take 100 mg by mouth every evening.   omeprazole 40 MG capsule Commonly known as:  PRILOSEC Take 40 mg by mouth daily before supper.   oxybutynin 10 MG 24 hr  tablet Commonly known as:  DITROPAN-XL Take 10 mg by mouth daily.   Potassium 99 MG Tabs Take 198 mg by mouth 2 (two) times daily.   predniSONE 10 MG tablet Commonly known  as:  DELTASONE 30 mg on 1/26 AM  followed by 20 mg PO x 2 days (1/27-1/28) followed by 10 mg (1/29-1/30)   sertraline 50 MG tablet Commonly known as:  ZOLOFT Take 1 tablet (50 mg total) by mouth every evening.   vitamin B-12 1000 MCG tablet Commonly known as:  CYANOCOBALAMIN Take 1,000 mcg by mouth daily.   XARELTO 20 MG Tabs tablet Generic drug:  rivaroxaban TAKE 1 TABLET(20 MG) BY MOUTH DAILY WITH SUPPER      Follow-up Information    [name], Bradley L, DO Follow up on 08/31/2018.   Specialty:  Pulmonary Disease Why:  Appt at 9:30 AM.  Please arrive at 9:15 for check in.  Contact information: [place] [address] [phone]        Casey Cha, MD Follow up in 1 week(s).   Specialty:  Internal Medicine Why:  BMP Contact information: [address] [place] [address] [phone]            Time coordinating discharge: 35 min  Signed:  Geradine Girt DO  [facility] 08/19/2018, 12:58 PM

## 2018-08-21 ENCOUNTER — Telehealth: Payer: Self-pay

## 2018-08-21 LAB — QUANTIFERON-TB GOLD PLUS: QuantiFERON-TB Gold Plus: UNDETERMINED

## 2018-08-21 LAB — HYPERSENSITIVITY PNEUMONITIS
A. Pullulans Abs: NEGATIVE
A.Fumigatus #1 Abs: NEGATIVE
MICROPOLYSPORA FAENI IGG: NEGATIVE
Pigeon Serum Abs: NEGATIVE
Thermoact. Saccharii: NEGATIVE
Thermoactinomyces vulgaris, IgG: NEGATIVE

## 2018-08-21 LAB — QUANTIFERON-TB GOLD PLUS (RQFGPL)
QuantiFERON Mitogen Value: 0.32 IU/mL
QuantiFERON Nil Value: 0.22 IU/mL
QuantiFERON TB1 Ag Value: 0.25 IU/mL
QuantiFERON TB2 Ag Value: 0.33 IU/mL

## 2018-08-21 NOTE — Telephone Encounter (Signed)
Per Dr. Valeta Harms:   He needs an appointment for the end of next week of the following with PFT prior. Okay to to do PFT one day and keep office visit appt seperate if okay with patient.

## 2018-08-21 NOTE — Telephone Encounter (Signed)
Appt made. Nothing further is needed at this time.  

## 2018-08-23 LAB — ALLERGENS W/TOTAL IGE AREA 2
Alternaria Alternata IgE: <0.1 kU/L
Aspergillus Fumigatus IgE: <0.1 kU/L
Bermuda Grass IgE: <0.1 kU/L
Cat Dander IgE: <0.1 kU/L
Cedar, Mountain IgE: <0.1 kU/L
Cladosporium Herbarum IgE: <0.1 kU/L
Cockroach, German IgE: <0.1 kU/L
Common Silver Birch IgE: <0.1 kU/L
Cottonwood IgE: <0.1 kU/L
D Farinae IgE: <0.1 kU/L
D Pteronyssinus IgE: <0.1 kU/L
Dog Dander IgE: <0.1 kU/L
Elm, American IgE: <0.1 kU/L
IgE (Immunoglobulin E), Serum: 27 IU/mL (ref 6–495)
Johnson Grass IgE: <0.1 kU/L
Maple/Box Elder IgE: <0.1 kU/L
Mouse Urine IgE: <0.1 kU/L
Oak, White IgE: <0.1 kU/L
Pigweed, Rough IgE: <0.1 kU/L
Sheep Sorrel IgE Qn: <0.1 kU/L
White Mulberry IgE: <0.1 kU/L

## 2018-08-24 ENCOUNTER — Encounter: Payer: Self-pay | Admitting: Internal Medicine

## 2018-08-24 ENCOUNTER — Telehealth: Payer: Self-pay | Admitting: Pulmonary Disease

## 2018-08-24 ENCOUNTER — Ambulatory Visit: Payer: Managed Care, Other (non HMO) | Admitting: Internal Medicine

## 2018-08-24 VITALS — BP 142/64 | HR 80 | Ht 73.0 in | Wt 241.8 lb

## 2018-08-24 DIAGNOSIS — J209 Acute bronchitis, unspecified: Secondary | ICD-10-CM | POA: Diagnosis not present

## 2018-08-24 DIAGNOSIS — Z8701 Personal history of pneumonia (recurrent): Secondary | ICD-10-CM

## 2018-08-24 LAB — NITRIC OXIDE: Nitric Oxide: 20

## 2018-08-24 NOTE — Progress Notes (Signed)
Synopsis: Self referral in November 2019 for SOB.  June 19, 2018.  Subjective:   PATIENT ID: Casey [name] GENDER: male DOB: 21-Mar-1956, MRN: [id number]  Chief Complaint  Patient presents with  . Consult    Sob since october, history of afib and on blood thinner, he has a recent ultra sound for Dvt.     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 PMH of atrial fibrillation as well as a several week hospitalization for respiratory failure requiring lung biopsy by Dr. Arlyce Dice several years ago.  He was out of work for approximately 3 months.  There was a question of whether or not he developed hantavirus?  After a trip out [place].  He also has a history of a schwannoma removal from an intercostal nerve.  He has always had a elevated left hemidiaphragm. Father with history of DVT X 2. He was on xarelto in the past. He traveled to [place] recently. He felt some right calf pain, no swelling or warmth. The past persisted for the past two weeks. D-dimer was positive 3, US duplex with superficial thrombophebitis.  The patient is a Stage manager.  He does play tennis once a week.  He does feel that while he is playing tennis he gets more short of breath on occasion.  He has noticed some limitation in his physical activity when he significantly exerts himself.  He mainly just wants to make sure there is not anything wrong with his lungs that may cause him to be short of breath.  He did have a recent chest x-ray that looked normal as well as a VQ scan that was completed in the ED due to his elevation of his serum creatinine that was low probability.  He denies chest pain or palpitations.  He does state he knows when he is in atrial fibrillation.  He is very sensitive to to knowing when he is in atrial fibrillation and has had ablation in the past.     OV 08/25/2018  Subjective:  Patient ID: Casey [name], Dr., male , DOB: 03/12/1956 , age 63 y.o. , MRN: [id number] , ADDRESS:  [address] Casey [name]   08/25/2018 -   Chief Complaint  Patient presents with  . Acute Visit    Pt has been having complaints of productive cough, congestion, and wheezing which became worse after started tapering down on prednisone. Pt did finish both prednisone and augmentin today, 1/30. Pt recently in [place] 1/22-1/25 with pna.   History is taken from him, his wife and extensive review of the medical records.  This is an acute visit  HPI Casey [name], Dr. 63 y.o. -history actually dates back to October 2019.  At this time he made a trip to [place].  Prior to that trip he developed an acute bronchitis.  But he recovered and went to [place].  Upon his return he had a high d-dimer and was diagnosed to have right calf superficial venous thrombosi.?  This happened in the setting of holding his Xarelto for prostatic biopsy which ended up being be benign.  Then in early November 2019 he said he got winded for a week.  He was exhausted.  He went to the emergency room.  VQ scan was low probability for PE.  Chest x-ray pulse ox and EKG were fine.  He then saw Dr. Leory Plowman [name] June 19, 2018 as stated above.  Pulmonary function test was arranged. Marland Kitchen  However, after  Dr. the visit with Dr. June Leap.  Go to [place] for a hike in December 2019.  Then, around Christmas and new year he underwent cardiac work-up.  This involved a stress test and cardiac catheterization and echocardiogram.  His echocardiogram showed ejection fraction 50-55% [several years earlier on review of the records in 2014 it was 5555% to 60%.  Of note this was a transesophageal echocardiogram and there was no right-to-left shunt at that time.).  His cardiac catheterization on July 25, 2018 was normal.  It is noted because of chronic kidney disease he never had CT angiogram.  During all this time he has been approximately 50% compliant with his anticoagulation.  There is a family history of DVT.  Then subsequently  early January 2019 he had bronchitis.  Pulmonary function test arrange for August 01, 2018 he could not do because he was sick.  He says a chest x-ray was clean.  He was treated with Levaquin.  For approximately 10 days and started feeling good.  But then the day after Casey [name] Day which would be Tuesday, August 15, 2018 he felt that exhausted and on Wednesday, August 16, 2018 is extremely dyspneic on exertion and he was hypoxemic with a pulse ox of 84%.  With exertion it dropped to 70% in the hospital.  He was wheezing.  He then got admitted to the hospital.  CT scan of the chest on August 16, 2018 showed a right lower lobe density of pneumonia versus mass [personally visualized and to me it looks more like pneumonia].  His chest x-ray itself was clear that I personally visualized.  During this time he had blood IgE autoimmune panel and blood allergy panel and blood eosinophils and all this was normal.  But QuantiFERON gold was normal.  He said he was treated with Zosyn and also steroids.  He was discharged 3 days later on August 19, 2018 which was 5 days ago.  Since discharge he started feeling better..  On August 24, 2018 he had his last thyroid which is on this day of the visit.    However, the reason for the acute visit is that for the 2 days prior to this visit he feels wheezy and chest tightness particularly when he exerts.  Sputum is beginning to turn a little bit yellow.  Feno - 20 ppb in our opffice and normal (tookd prednisone).  When I examined him there is no wheeze except 1 or 2 times which I thought was upper airway wheezing.  He went and saw Dr. Constance Holster after this visit Dr. Janeice Robinson ENT.  Patient subsequently texted me saying that the upper airway ENT evaluation including vocal cord movements was normal without any paradoxical movement.  There is no redness ulcerations.  The trachea immediately distal to the vocal cords also look normal.  We did do a walking desaturation test and this  is documented below and is actually normal with an improvement in pulse ox.  It was on room air.  Wife thinks he is deconditioned and is not taking adequate rest.  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  This was brought up in discussion with whether or not he has been chronically exposed to an irritant that leads to these respiratory symptoms and complaints.  ROS - per HPI     has a past medical history of 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HCC), Arthritis, Atrial enlargement, bilateral, Atrial fibrillation (HCC), Atrial fibrillation ([place]), Atrial flutter ([place]), Complication of anesthesia, Dysrhythmia, GERD (gastroesophageal reflux disease), Hyperaldosteronism ([place]), Hypertension, Kidney cysts, Nephrolithiasis, Pneumonia (1990's), Pre-diabetes, Prostatitis, and Renal insufficiency.   reports that he has never smoked. He has never used smokeless tobacco.  Past Surgical History:  Procedure Laterality Date  . ABLATION    . ATRIAL FIBRILLATION ABLATION N/A 09/28/2012   Procedure: ATRIAL FIBRILLATION ABLATION;  Surgeon: Thompson Grayer, MD;  Location: [facility] CATH LAB;  Service: Cardiovascular;  Laterality: N/A;  . atrial fibrillation ablation with CTI ablation  05/27/11, 09/28/12   ablation for afib and atrial flutter by Dr Rayann Heman   . BACK SURGERY  08/09/2016   C 5 6  C 6 to C 7 cervical fusion  . CARDIOVERSION  07/28/2011   Procedure: CARDIOVERSION;  Surgeon: Lelon Perla, MD;  Location: [place];  Service: Cardiovascular;  Laterality: N/A;  . COLONOSCOPY WITH PROPOFOL N/A 03/03/2015   Procedure: COLONOSCOPY WITH PROPOFOL;  Surgeon: Garlan Fair, MD;  Location: [facility] ENDOSCOPY;  Service: Gastroenterology;  Laterality: N/A;  . double j stent placement  06/2004   left  . ESOPHAGOGASTRODUODENOSCOPY (EGD) WITH PROPOFOL N/A 03/03/2015   Procedure: ESOPHAGOGASTRODUODENOSCOPY (EGD) WITH PROPOFOL;  Surgeon: Garlan Fair, MD;  Location: [facility] ENDOSCOPY;  Service: Gastroenterology;  Laterality: N/A;  . ESOPHAGOGASTRODUODENOSCOPY (EGD) WITH PROPOFOL N/A 10/25/2016   Procedure: ESOPHAGOGASTRODUODENOSCOPY (EGD) WITH PROPOFOL;  Surgeon: Garlan Fair, MD;  Location: [facility] ENDOS[facility];  Service: Endoscopy;  Laterality: N/A;  . INGUINAL HERNIA REPAIR     "as a child; ? right"  . INGUINAL HERNIA REPAIR Left 06/23/2016   Procedure: OPEN LEFT INGUINAL HERNIA  REPAIR WITH MESH;  Surgeon: Johnathan Hausen, MD;  Location: [place];  Service: General;  Laterality: Left;  . INSERTION OF MESH Left 06/23/2016   Procedure: INSERTION OF MESH;  Surgeon: Johnathan Hausen, MD;  Location: [place];  Service: General;  Laterality: Left;  . LEFT HEART CATH AND CORONARY ANGIOGRAPHY N/A 07/25/2018   Procedure: LEFT HEART CATH AND CORONARY ANGIOGRAPHY;  Surgeon: Belva Crome, MD;  Location: [place] CV LAB;  Service: Cardiovascular;  Laterality: N/A;  . LUNG BIOPSY  1993  . NASAL SEPTOPLASTY W/ TURBINOPLASTY  07/2006  . prostate biopsy  2011  . schwannoma removal  ~01/2010   left thorax  . TEE WITHOUT CARDIOVERSION N/A 09/27/2012   Procedure: TRANSESOPHAGEAL ECHOCARDIOGRAM (TEE);  Surgeon: Larey Dresser, MD;  Location: [place];  Service: Cardiovascular;  Laterality: N/A;  . ULTRASOUND GUIDANCE FOR VASCULAR ACCESS   07/25/2018   Procedure: Ultrasound Guidance For Vascular Access;  Surgeon: Belva Crome, MD;  Location: [place] CV LAB;  Service: Cardiovascular;;  . UMBILICAL HERNIA REPAIR  07/2006    Allergies  Allergen Reactions  . Aldactone [Spironolactone]  Breast tissue enlargement  . Codeine Other (See Comments)    Headache   . Lisinopril Cough  . Nexium [Esomeprazole Magnesium] Diarrhea  . Sulfa Antibiotics Rash  . Sulfa Drugs Cross Reactors Rash    Immunization History  Administered Date(s) Administered  . Influenza, High Dose Seasonal PF 05/12/2018    Family History  Problem Relation Age of Onset  . Atrial fibrillation Other        sister has had 2 afib ablations  . Atrial fibrillation Sister      Current Outpatient Medications:  .  albuterol (PROVENTIL HFA;VENTOLIN HFA) 108 (90 Base) MCG/ACT inhaler, Inhale 2 puffs into the lungs every 6 (six) hours as needed for wheezing or shortness of breath., Disp: 1 Inhaler, Rfl: 2 .  allopurinol (ZYLOPRIM) 100 MG tablet, Take 100 mg by mouth 2 (two) times daily. , Disp: , Rfl:  .  atorvastatin (LIPITOR) 10 MG tablet, Take 10 mg by mouth every evening. , Disp: , Rfl:  .  beclomethasone (QVAR) 80 MCG/ACT inhaler, Inhale 1 puff into the lungs 2 (two) times daily., Disp: , Rfl:  .  CIALIS 20 MG tablet, Take 20 mg by mouth daily as needed for erectile dysfunction. , Disp: , Rfl: 11 .  diltiazem (CARDIZEM CD) 360 MG 24 hr capsule, Take 360 mg by mouth every evening., Disp: , Rfl: 7 .  eplerenone (INSPRA) 50 MG tablet, Take 50 mg by mouth 2 (two) times daily., Disp: , Rfl:  .  FIBER ADULT GUMMIES PO, Take 2 tablets by mouth 2 (two) times daily. , Disp: , Rfl:  .  ibuprofen (ADVIL,MOTRIN) 200 MG tablet, Take 200-400 mg by mouth daily as needed for headache or moderate pain. , Disp: , Rfl:  .  loperamide (IMODIUM A-D) 2 MG tablet, Take 2 mg by mouth 2 (two) times daily., Disp: , Rfl:  .  losartan (COZAAR) 100 MG tablet, Take 100 mg by mouth  every evening., Disp: , Rfl:  .  omeprazole (PRILOSEC) 40 MG capsule, Take 40 mg by mouth daily before supper. , Disp: , Rfl:  .  oxybutynin (DITROPAN-XL) 10 MG 24 hr tablet, Take 10 mg by mouth daily., Disp: , Rfl:  .  Potassium 99 MG TABS, Take 198 mg by mouth 2 (two) times daily., Disp: , Rfl:  .  rivaroxaban (XARELTO) 20 MG TABS tablet, TAKE 1 TABLET(20 MG) BY MOUTH DAILY WITH SUPPER, Disp: 30 tablet, Rfl: 0 .  sertraline (ZOLOFT) 50 MG tablet, Take 1 tablet (50 mg total) by mouth every evening., Disp: 90 tablet, Rfl: 1 .  vitamin B-12 (CYANOCOBALAMIN) 1000 MCG tablet, Take 1,000 mcg by mouth daily., Disp: , Rfl:       Objective:   Vitals:   08/24/18 1443  BP: (!) 142/64  Pulse: 80  SpO2: 92%  Weight: 241 lb 12.8 oz (109.7 kg)  Height: 6\' 1"  (1.854 m)    Estimated body mass index is 31.9 kg/m as calculated from the following:   Height as of this encounter: 6\' 1"  (1.854 m).   Weight as of this encounter: 241 lb 12.8 oz (109.7 kg).  @WEIGHTCHANGE @  [employer]   08/24/18 1443  Weight: 241 lb 12.8 oz (109.7 kg)     Physical Exam  General Appearance:    Alert, cooperative, no distress, appears stated age - yes, Deconditioned looking - yes compared to what we see him in hospital at work , OBESE  - yes, Sitting on Wheelchair -  no  Head:  Normocephalic, without obvious abnormality, atraumatic  Eyes:    PERRL, conjunctiva/corneas clear,  Ears:    Normal TM's and external ear canals, both ears  Nose:   Nares normal, septum midline, mucosa normal, no drainage    or sinus tenderness. OXYGEN ON  - no . Patient is @ ra   Throat:   Lips, mucosa, and tongue normal; teeth and gums normal. Cyanosis on lips - no  Neck:   Supple, symmetrical, trachea midline, no adenopathy;    thyroid:  no enlargement/tenderness/nodules; no carotid   bruit or JVD  Back:     Symmetric, no curvature, ROM normal, no CVA tenderness  Lungs:     Distress - no , Wheeze NO (1-2 times had transmitted upper  airway wheeze), Barrell Chest - no, Purse lip breathing - no, Crackles - no   Chest Wall:    No tenderness or deformity.    Heart:    Regular rate and rhythm, S1 and S2 normal, no rub   or gallop, Murmur - no  Breast Exam:    NOT DONE  Abdomen:     Soft, non-tender, bowel sounds active all four quadrants,    no masses, no organomegaly. Visceral obesity - yes  Genitalia:   NOT DONE  Rectal:   NOT DONE  Extremities:   Extremities - normal, Has Cane - no, Clubbing - no, Edema - no  Pulses:   2+ and symmetric all extremities  Skin:   Stigmata of Connective Tissue Disease - no  Lymph nodes:   Cervical, supraclavicular, and axillary nodes normal  Psychiatric:  Neurologic:   Pleasant - yes, Anxious - no, Flat affect - no  CAm-ICU - neg, Alert and Oriented x 3 - yes, Moves all 4s - yes, Speech - normal, Cognition - intact           Assessment:       ICD-10-CM   1. Acute bronchitis, unspecified organism J20.9 Nitric oxide  2. History of pneumonia Z87.01    Multiple issues here.  I think most recently what has happened is that he has had right lower lobe pneumonia that caused hypoxemia and admission to the hospital in January 2020.  I think his risk of pneumonia went up after an episode of bronchitis in October 2019 and the trip to [place] in October 2019 and then another episode of bronchitis subsequently in November 2019 followed by visits to the hospital for cardiac issues such as catheterization end of December to 2019.  On top of this this is also respiratory virus season.  At this point in time he is recovered from his pneumonia admission.  He is not hypoxemic.  He is having residual symptoms of chest tightness and cough and wheezing.  The exhaled nitric oxide today albeit while on prednisone today is normal.  In January 2020 his blood IgE, blood allergy profile blood eosinophil count does not show any evidence of asthma or at least allergic asthma.  Therefore pending pulmonary function  test despite his wheezing the evidence of asthma at this point is lower than what originally suspected.  Nevertheless is not fully ruled out because he still in the effects of prednisone which did help him and he did actually wheeze.   Other differential diagnosis for his wheezing includes upper airway issues given the fact a couple of times I heard forced expiratory wheeze during the exam [subsequent ENT evaluation for the upper airway on August 24, 2018 by his colleague Dr. Constance Holster is  normal].  Another differential diagnosis of the wheezing could be diastolic dysfunction while under the stress of pneumonia in the hospital.   Interstitial lung disease is ruled out based on CT scan of the chest.  At this point I do not think that he is having an acute recurrent bronchitis but he might just be having reactive airways from his recent hospitalization and also physical deconditioning that is making him feel tight in his chest.  The hypoxemia is definitely improved.  In addition he is deconditioned.  So we discussed and thought observational therapy is probably the best approach.  He has my number to contact me at any time if needed.  I would certainly be interested in knowing his cardiac evaluation on August 25, 2018 and also upcoming pulmonary function test.  He will then follow-up with Dr. Valeta Harms  He and his wife in agreement with this plan  Plan:     Patient Instructions     ICD-10-CM   1. Acute bronchitis, unspecified organism J20.9     You seem to be recovering Differential diagnosis is acute pneumonia/bronchitis - resolving/improving There could be upper airway issues Recent issues could be pneumonia related, diastolic/systolic dysfunction rlated  Plan - ENT eval 08/24/2018 - monitor yourself at home  - PFT next week - call me/text me when done - any worsening call me  And we can re-evaluate you - please ask cardiology if there is need to repeat echo to look at EF (55-60% in 2014 but  now 50-55%)  Followup  Dr Valeta Harms as before  > 50% of this > 40 min visit spent in face to face counseling or/and coordination of care - by this undersigned MD - Dr Brand Males. This includes one or more of the following documented above: discussion of test results, diagnostic or treatment recommendations, prognosis, risks and benefits of management options, instructions, education, compliance or risk-factor reduction    SIGNATURE    Dr. Brand Males, M.D., F.C.C.P,  Pulmonary and Critical Care Medicine Staff Physician, [place] Director - Interstitial Lung Disease  Program  [name] [place], [place], [address]  Pager: [phone], If no answer or between  15:00h - 7:00h: call [phone] Telephone: [phone]  1:31 PM 08/25/2018

## 2018-08-24 NOTE — Patient Instructions (Addendum)
ICD-10-CM   1. Acute bronchitis, unspecified organism J20.9 Nitric oxide  2. History of pneumonia Z87.01     You seem to be recovering Differential diagnosis is acute pneumonia/bronchitis - resolving/improving There could be upper airway issues Recent issues could be pneumonia related, diastolic/systolic dysfunction rlated  Plan - ENT eval 08/24/2018 - monitor yourself at home  - PFT next week - call me/text me when done - any worsening call me  And we can re-evaluate you - please ask cardiology if there is need to repeat echo to look at EF (55-60% in 2014 but now 50-55%)  Followup  Dr Valeta Harms as before

## 2018-08-24 NOTE — Telephone Encounter (Signed)
Called by patient with concerns of feeling poorly.  Reports he was initially feeling better on higher prednisone dosing and now is tapering.  Did not sleep well last night.  Feels he is more congested with moist cough and wheezing.  Finishing up augmentin.  Concerned with new wheezing / increased mucus production.  Denies fevers/chills.  States home O2 saturations running 95-96% on room air this am which is improved from recent hospitalization.    Office contacted for acute visit > pt to see Dr. Chase Caller at 2:30 PM.  Pt instructed to arrive at 2:15 pm for check in.    Noe Gens, NP-C [facility] Pgr: [phone] or if no answer [phone] 08/24/2018, 9:00 AM

## 2018-08-25 ENCOUNTER — Ambulatory Visit: Payer: Managed Care, Other (non HMO) | Admitting: Internal Medicine

## 2018-08-25 ENCOUNTER — Encounter: Payer: Self-pay | Admitting: Internal Medicine

## 2018-08-25 ENCOUNTER — Telehealth: Payer: Self-pay | Admitting: Internal Medicine

## 2018-08-25 VITALS — BP 122/76 | HR 68 | Ht 73.0 in | Wt 242.8 lb

## 2018-08-25 DIAGNOSIS — I10 Essential (primary) hypertension: Secondary | ICD-10-CM | POA: Diagnosis not present

## 2018-08-25 DIAGNOSIS — I480 Paroxysmal atrial fibrillation: Secondary | ICD-10-CM | POA: Diagnosis not present

## 2018-08-25 DIAGNOSIS — R7612 Nonspecific reaction to cell mediated immunity measurement of gamma interferon antigen response without active tuberculosis: Secondary | ICD-10-CM

## 2018-08-25 NOTE — Progress Notes (Signed)
Patient Care Team: Leeroy Cha, MD as PCP - General (Internal Medicine) Jerline Pain, MD (Cardiology)   HPI  Casey [name], Dr. is a 63 y.o. male Seen in followup for normal cath undertaken for chest pain; dyspnea and abnormal stress test   He has been struggling for months now with dyspnea and two weeks abo was admitted with pneumonia and hypoxemia  He has hx of allergic diasthesis and now some wheezing with PFTs pending  LVEDP was 16  Echo reviewed with PR and normal Right sided function and pressures so without evidence of shunting or pulm HTN    Records and Results Reviewed  [name] records   Past Medical History:  Diagnosis Date  . 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HCC)    ongoing evaluation for elevated R>L adosterone secretion  . Arthritis   . Atrial enlargement, bilateral   . Atrial fibrillation ([place])   . Atrial fibrillation (HCC)    paroxysmal  . Atrial flutter ([place])   . Complication of anesthesia    could not urinate after UHR, needed cath, mental fog sinc cervical fusion 08-09-16   . Dysrhythmia    a-fib-had ablation x2  . GERD (gastroesophageal reflux disease)   . Hyperaldosteronism ([place])   . Hypertension   . Kidney cysts    "multiple"  . Nephrolithiasis   . Pneumonia 1990's  . Pre-diabetes   . Prostatitis   . Renal insufficiency    "Cr 1.6"    Past Surgical History:  Procedure Laterality Date  . ABLATION    . ATRIAL FIBRILLATION ABLATION N/A 09/28/2012   Procedure: ATRIAL FIBRILLATION ABLATION;  Surgeon: Thompson Grayer, MD;  Location: [facility] CATH LAB;  Service: Cardiovascular;  Laterality: N/A;  . atrial fibrillation ablation with CTI ablation  05/27/11, 09/28/12   ablation for afib and atrial flutter by Dr Rayann Heman  . BACK SURGERY  08/09/2016   C 5 6  C 6 to C 7 cervical fusion  . CARDIOVERSION  07/28/2011   Procedure: CARDIOVERSION;  Surgeon: Lelon Perla, MD;  Location: [place];  Service: Cardiovascular;  Laterality: N/A;  .  COLONOSCOPY WITH PROPOFOL N/A 03/03/2015   Procedure: COLONOSCOPY WITH PROPOFOL;  Surgeon: Garlan Fair, MD;  Location: [facility] ENDOS[facility];  Service: Gastroenterology;  Laterality: N/A;  . double j stent placement  06/2004   left  . ESOPHAGOGASTRODUODENOSCOPY (EGD) WITH PROPOFOL N/A 03/03/2015   Procedure: ESOPHAGOGASTRODUODENOSCOPY (EGD) WITH PROPOFOL;  Surgeon: Garlan Fair, MD;  Location: [facility];  Service: Gastroenterology;  Laterality: N/A;  . ESOPHAGOGASTRODUODENOSCOPY (EGD) WITH PROPOFOL N/A 10/25/2016   Procedure: ESOPHAGOGASTRODUODENOSCOPY (EGD) WITH PROPOFOL;  Surgeon: Garlan Fair, MD;  Location: [facility];  Service: Endoscopy;  Laterality: N/A;  . INGUINAL HERNIA REPAIR     "as a child; ? right"  . INGUINAL HERNIA REPAIR Left 06/23/2016   Procedure: OPEN LEFT INGUINAL HERNIA  REPAIR WITH MESH;  Surgeon: Johnathan Hausen, MD;  Location: [place];  Service: General;  Laterality: Left;  . INSERTION OF MESH Left 06/23/2016   Procedure: INSERTION OF MESH;  Surgeon: Johnathan Hausen, MD;  Location: [place];  Service: General;  Laterality: Left;  . LEFT HEART CATH AND CORONARY ANGIOGRAPHY N/A 07/25/2018   Procedure: LEFT HEART CATH AND CORONARY ANGIOGRAPHY;  Surgeon: Belva Crome, MD;  Location: [facility] CV LAB;  Service: Cardiovascular;  Laterality: N/A;  . LUNG BIOPSY  1993  . NASAL SEPTOPLASTY W/ TURBINOPLASTY  07/2006  . prostate biopsy  2011  . schwannoma removal  ~01/2010   left thorax  . TEE WITHOUT CARDIOVERSION N/A 09/27/2012   Procedure: TRANSESOPHAGEAL ECHOCARDIOGRAM (TEE);  Surgeon: Larey Dresser, MD;  Location: [place];  Service: Cardiovascular;  Laterality: N/A;  . ULTRASOUND GUIDANCE FOR VASCULAR ACCESS  07/25/2018   Procedure: Ultrasound Guidance For Vascular Access;  Surgeon: Belva Crome, MD;  Location: [place] CV [facility];  Service: Cardiovascular;;  . UMBILICAL HERNIA REPAIR  07/2006    Current Meds  Medication Sig    . albuterol (PROVENTIL HFA;VENTOLIN HFA) 108 (90 Base) MCG/ACT inhaler Inhale 2 puffs into the lungs every 6 (six) hours as needed for wheezing or shortness of breath.  . allopurinol (ZYLOPRIM) 100 MG tablet Take 100 mg by mouth 2 (two) times daily.   Marland Kitchen atorvastatin (LIPITOR) 10 MG tablet Take 10 mg by mouth every evening.   . beclomethasone (QVAR) 80 MCG/ACT inhaler Inhale 1 puff into the lungs 2 (two) times daily.  Marland Kitchen CIALIS 20 MG tablet Take 20 mg by mouth daily as needed for erectile dysfunction.   Marland Kitchen diltiazem (CARDIZEM CD) 360 MG 24 hr capsule Take 360 mg by mouth every evening.  Marland Kitchen eplerenone (INSPRA) 50 MG tablet Take 50 mg by mouth 2 (two) times daily.  Marland Kitchen FIBER ADULT GUMMIES PO Take 2 tablets by mouth 2 (two) times daily.   Marland Kitchen ibuprofen (ADVIL,MOTRIN) 200 MG tablet Take 200-400 mg by mouth daily as needed for headache or moderate pain.   Marland Kitchen loperamide (IMODIUM A-D) 2 MG tablet Take 2 mg by mouth 2 (two) times daily.  Marland Kitchen losartan (COZAAR) 100 MG tablet Take 100 mg by mouth every evening.  Marland Kitchen omeprazole (PRILOSEC) 40 MG capsule Take 40 mg by mouth daily before supper.   Marland Kitchen oxybutynin (DITROPAN-XL) 10 MG 24 hr tablet Take 10 mg by mouth daily.  . Potassium 99 MG TABS Take 198 mg by mouth 2 (two) times daily.  . rivaroxaban (XARELTO) 20 MG TABS tablet TAKE 1 TABLET(20 MG) BY MOUTH DAILY WITH SUPPER  . sertraline (ZOLOFT) 50 MG tablet Take 1 tablet (50 mg total) by mouth every evening.  . vitamin B-12 (CYANOCOBALAMIN) 1000 MCG tablet Take 1,000 mcg by mouth daily.    Allergies  Allergen Reactions  . Aldactone [Spironolactone]     Breast tissue enlargement  . Codeine Other (See Comments)    Headache   . Lisinopril Cough  . Nexium [Esomeprazole Magnesium] Diarrhea  . Sulfa Antibiotics Rash  . Sulfa Drugs Cross Reactors Rash      Review of Systems negative except from HPI and PMH  Physical Exam BP 122/76   Pulse 68   Ht 6\' 1"  (1.854 m)   Wt 242 lb 12.8 oz (110.1 kg)   SpO2 94%    BMI 32.03 kg/m  Well developed and well nourished in no acute distress HENT normal E scleral and icterus clear Neck Supple JVP flat; carotids brisk and full Clearw forced expiratory wheezing and prolonged expiration   Regular rate and rhythm, no murmurs gallops or rub Soft with active bowel sounds No clubbing cyanosis  Edema Alert and oriented, grossly normal motor and sensory function Skin Warm and Dry  ECG  Sinus @ 68 23/11/39 PRWP IMI   Assessment and  Plan Dyspnea  Abnormal ECG  AFib s/p ablation     The patient has no evidence of obstructive coronary disease.  LVEDP was mildly elevated.  Right-sided pressures are normal with collapse of the IVC.  The latter  are inconsistent with pulmonary hypertension as a mechanism of his dyspnea.  He is following up with pulmonary.  He is wheezing today on examination.  Not a pulmonologist but suspect that this is related to reactive airways secondary to infection.   We will follow-up with his regular cardiologist.  I will see as needed.   More than 50% of 40 min was spent in counseling related to the above       Current medicines are reviewed at length with the patient today .  The patient does not  have concerns regarding medicines.

## 2018-08-25 NOTE — Telephone Encounter (Signed)
Called and spoke with patient he is aware and verbalized understanding. Order has been placed. Nothing further needed.

## 2018-08-25 NOTE — Patient Instructions (Signed)
Medication Instructions:  Your physician recommends that you continue on your current medications as directed. Please refer to the Current Medication list given to you today.  Labwork: None ordered.  Testing/Procedures: None ordered.  Follow-Up: Your physician recommends that you schedule a follow-up appointment as needed with Dr Klein  Any Other Special Instructions Will Be Listed Below (If Applicable).     If you need a refill on your cardiac medications before your next appointment, please call your pharmacy.  

## 2018-08-25 NOTE — Telephone Encounter (Addendum)
Dr [name], Dr. texted me saying his 08/18/2018 Valma Cava is indeterminate. Please place an order for rpreat Quantiferon Gold (indic pneumonia, healthcare professional) and let him know when he can come and do it. He likes to do it 08/28/2018 or 08/29/2018  Also check BNP blood test

## 2018-08-28 ENCOUNTER — Other Ambulatory Visit: Payer: Self-pay | Admitting: Pulmonary Disease

## 2018-08-28 DIAGNOSIS — R0609 Other forms of dyspnea: Secondary | ICD-10-CM

## 2018-08-29 ENCOUNTER — Ambulatory Visit (INDEPENDENT_AMBULATORY_CARE_PROVIDER_SITE_OTHER): Payer: Managed Care, Other (non HMO) | Admitting: Pulmonary Disease

## 2018-08-29 DIAGNOSIS — R0609 Other forms of dyspnea: Secondary | ICD-10-CM | POA: Diagnosis not present

## 2018-08-29 LAB — PULMONARY FUNCTION TEST
DL/VA % pred: 109 %
DL/VA: 4.48 ml/min/mmHg/L
DLCO UNC % PRED: 81 %
DLCO cor % pred: 86 %
DLCO cor: 26.2 ml/min/mmHg
DLCO unc: 24.85 ml/min/mmHg
FEF 25-75 PRE: 1.29 L/s
FEF 25-75 Post: 1.11 L/sec
FEF2575-%Change-Post: -14 %
FEF2575-%Pred-Post: 35 %
FEF2575-%Pred-Pre: 40 %
FEV1-%Change-Post: -2 %
FEV1-%Pred-Post: 55 %
FEV1-%Pred-Pre: 56 %
FEV1-PRE: 2.28 L
FEV1-Post: 2.22 L
FEV1FVC-%Change-Post: 1 %
FEV1FVC-%Pred-Pre: 89 %
FEV6-%Change-Post: -3 %
FEV6-%Pred-Post: 63 %
FEV6-%Pred-Pre: 65 %
FEV6-Post: 3.2 L
FEV6-Pre: 3.32 L
FEV6FVC-%CHANGE-POST: 0 %
FEV6FVC-%Pred-Post: 102 %
FEV6FVC-%Pred-Pre: 102 %
FVC-%Change-Post: -3 %
FVC-%Pred-Post: 61 %
FVC-%Pred-Pre: 63 %
FVC-Post: 3.27 L
FVC-Pre: 3.38 L
POST FEV1/FVC RATIO: 68 %
Post FEV6/FVC ratio: 98 %
Pre FEV1/FVC ratio: 67 %
Pre FEV6/FVC Ratio: 98 %
RV % PRED: 113 %
RV: 2.88 L
TLC % pred: 84 %
TLC: 6.63 L

## 2018-08-29 NOTE — Progress Notes (Signed)
PFT done today. 

## 2018-08-31 ENCOUNTER — Telehealth: Payer: Self-pay | Admitting: Pulmonary Disease

## 2018-08-31 ENCOUNTER — Encounter: Payer: Self-pay | Admitting: Pulmonary Disease

## 2018-08-31 ENCOUNTER — Ambulatory Visit: Payer: Managed Care, Other (non HMO) | Admitting: Pulmonary Disease

## 2018-08-31 VITALS — BP 118/80 | HR 74 | Ht 74.0 in | Wt 236.0 lb

## 2018-08-31 DIAGNOSIS — Z8701 Personal history of pneumonia (recurrent): Secondary | ICD-10-CM | POA: Diagnosis not present

## 2018-08-31 DIAGNOSIS — J449 Chronic obstructive pulmonary disease, unspecified: Secondary | ICD-10-CM

## 2018-08-31 DIAGNOSIS — R0609 Other forms of dyspnea: Secondary | ICD-10-CM

## 2018-08-31 DIAGNOSIS — R7612 Nonspecific reaction to cell mediated immunity measurement of gamma interferon antigen response without active tuberculosis: Secondary | ICD-10-CM

## 2018-08-31 LAB — BRAIN NATRIURETIC PEPTIDE: PRO B NATRI PEPTIDE: 46 pg/mL (ref 0.0–100.0)

## 2018-08-31 MED ORDER — FLUTICASONE-UMECLIDIN-VILANT 100-62.5-25 MCG/INH IN AEPB: 1.0000 | INHALATION_SPRAY | Freq: Every day | RESPIRATORY_TRACT | 0 refills | Status: DC

## 2018-08-31 MED ORDER — ALBUTEROL SULFATE (2.5 MG/3ML) 0.083% IN NEBU: 2.5000 mg | INHALATION_SOLUTION | RESPIRATORY_TRACT | 1 refills | Status: DC | PRN

## 2018-08-31 NOTE — Patient Instructions (Addendum)
Thank you for visiting Dr. Valeta Harms at [facility]. Today we recommend the following: Orders Placed This Encounter  Procedures  . CT Chest High Resolution  . B Nat Peptide   Please go to lab to obtain labs that MR ordered.   We are starting you on Trelegy. 1 puff daily.   Return in about 8 weeks (around 10/26/2018). RTC in 8 weeks after HRCT.

## 2018-08-31 NOTE — Progress Notes (Signed)
Synopsis: Self referral in November 2019 for SOB.   Subjective:   PATIENT ID: Casey [name], Dr. Theadora Rama: male DOB: Mar 12, 1956, MRN: [id number]  Chief Complaint  Patient presents with  . Hospitalization Follow-up    Recently seen in ED for DOE. States he is couhing up yellow green mucous and he is still wheezing.     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 PMH of atrial fibrillation as well as a several week hospitalization for respiratory failure requiring lung biopsy by Dr. Arlyce Dice several years ago.  He was out of work for approximately 3 months.  There was a question of whether or not he developed hantavirus?  After a trip out [place].  He also has a history of a schwannoma removal from an intercostal nerve.  He has always had a elevated left hemidiaphragm. Father with history of DVT X 2. He was on xarelto in the past. He traveled to [place] recently. He felt some right calf pain, no swelling or warmth. The past persisted for the past two weeks. D-dimer was positive 3, US duplex with superficial thrombophebitis.  The patient is a Stage manager.  He does play tennis once a week.  He does feel that while he is playing tennis he gets more short of breath on occasion.  He has noticed some limitation in his physical activity when he significantly exerts himself.  He mainly just wants to make sure there is not anything wrong with his lungs that may cause him to be short of breath.  He did have a recent chest x-ray that looked normal as well as a VQ scan that was completed in the ED due to his elevation of his serum creatinine that was low probability.  He denies chest pain or palpitations.  He does state he knows when he is in atrial fibrillation.  He is very sensitive to to knowing when he is in atrial fibrillation and has had ablation in the past.  OV 08/31/2018: Since last office visit patient was unfortunately mid to the hospital for hypoxemic episode as well as a lower lobe  pneumonia with small infiltrate.  He was treated with antibiotics steroids and discharged.  Since that he has been last seen in the office he is also had cardiac evaluation to include left heart catheterization.  Patient had pulmonary function tests completed prior to office visit today which reveals a ratio of 68 and a FEV1 of 55% predicted.  Consistent with moderate obstructive disease.  Overall since his hospitalization he has been improving slowly.  Occasionally does feel that he has chest tightness.  He still occasionally has wheeze.  He does feel that he was breathing much better using a nebulizer in the hospital and would like to have one at home.    Past Medical History:  Diagnosis Date  . 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HCC)    ongoing evaluation for elevated R>L adosterone secretion  . Arthritis   . Atrial enlargement, bilateral   . Atrial fibrillation ([place])   . Atrial fibrillation (HCC)    paroxysmal  . Atrial flutter ([place])   . Complication of anesthesia    could not urinate after UHR, needed cath, mental fog sinc cervical fusion 08-09-16   . Dysrhythmia    a-fib-had ablation x2  . GERD (gastroesophageal reflux disease)   . Hyperaldosteronism ([place])   . Hypertension   . Kidney cysts    "multiple"  . Nephrolithiasis   . Pneumonia 1990's  .  Pre-diabetes   . Prostatitis   . Renal insufficiency    "Cr 1.6"     Family History  Problem Relation Age of Onset  . Atrial fibrillation Other        sister has had 2 afib ablations  . Atrial fibrillation Sister      Past Surgical History:  Procedure Laterality Date  . ABLATION    . ATRIAL FIBRILLATION ABLATION N/A 09/28/2012   Procedure: ATRIAL FIBRILLATION ABLATION;  Surgeon: Thompson Grayer, MD;  Location: [facility] CATH LAB;  Service: Cardiovascular;  Laterality: N/A;  . atrial fibrillation ablation with CTI ablation  05/27/11, 09/28/12   ablation for afib and atrial flutter by Dr Rayann Heman  . BACK SURGERY  08/09/2016   C 5 6  C 6 to C 7  cervical fusion  . CARDIOVERSION  07/28/2011   Procedure: CARDIOVERSION;  Surgeon: Lelon Perla, MD;  Location: [facility];  Service: Cardiovascular;  Laterality: N/A;  . COLONOSCOPY WITH PROPOFOL N/A 03/03/2015   Procedure: COLONOSCOPY WITH PROPOFOL;  Surgeon: Garlan Fair, MD;  Location: [facility] ENDOSCOPY;  Service: Gastroenterology;  Laterality: N/A;  . double j stent placement  06/2004   left  . ESOPHAGOGASTRODUODENOSCOPY (EGD) WITH PROPOFOL N/A 03/03/2015   Procedure: ESOPHAGOGASTRODUODENOSCOPY (EGD) WITH PROPOFOL;  Surgeon: Garlan Fair, MD;  Location: [facility];  Service: Gastroenterology;  Laterality: N/A;  . ESOPHAGOGASTRODUODENOSCOPY (EGD) WITH PROPOFOL N/A 10/25/2016   Procedure: ESOPHAGOGASTRODUODENOSCOPY (EGD) WITH PROPOFOL;  Surgeon: Garlan Fair, MD;  Location: [facility];  Service: Endoscopy;  Laterality: N/A;  . INGUINAL HERNIA REPAIR     "as a child; ? right"  . INGUINAL HERNIA REPAIR Left 06/23/2016   Procedure: OPEN LEFT INGUINAL HERNIA  REPAIR WITH MESH;  Surgeon: Johnathan Hausen, MD;  Location: [facility];  Service: General;  Laterality: Left;  . INSERTION OF MESH Left 06/23/2016   Procedure: INSERTION OF MESH;  Surgeon: Johnathan Hausen, MD;  Location: [facility];  Service: General;  Laterality: Left;  . LEFT HEART CATH AND CORONARY ANGIOGRAPHY N/A 07/25/2018   Procedure: LEFT HEART CATH AND CORONARY ANGIOGRAPHY;  Surgeon: Belva Crome, MD;  Location: [facility] CV LAB;  Service: Cardiovascular;  Laterality: N/A;  . LUNG BIOPSY  1993  . NASAL SEPTOPLASTY W/ TURBINOPLASTY  07/2006  . prostate biopsy  2011  . schwannoma removal  ~01/2010   left thorax  . TEE WITHOUT CARDIOVERSION N/A 09/27/2012   Procedure: TRANSESOPHAGEAL ECHOCARDIOGRAM (TEE);  Surgeon: Larey Dresser, MD;  Location: [facility];  Service: Cardiovascular;  Laterality: N/A;  . ULTRASOUND GUIDANCE FOR VASCULAR ACCESS  07/25/2018   Procedure: Ultrasound Guidance For  Vascular Access;  Surgeon: Belva Crome, MD;  Location: [facility] CV LAB;  Service: Cardiovascular;;  . UMBILICAL HERNIA REPAIR  07/2006    Social History   Socioeconomic History  . Marital status: Married    Spouse name: Not on file  . Number of children: Not on file  . Years of education: Not on file  . Highest education level: Not on file  Occupational History  . Not on file  Social Needs  . Financial resource strain: Not on file  . Food insecurity:    Worry: Not on file    Inability: Not on file  . Transportation needs:    Medical: Not on file    Non-medical: Not on file  Tobacco Use  . Smoking status: Never Smoker  . Smokeless tobacco: Never Used  Substance and Sexual  Activity  . Alcohol use: Yes    Alcohol/week: 2.0 standard drinks    Types: 2 Shots of liquor per week    Comment: occasional, 05-11-2016 Per pt 3 drinks per wk  . Drug use: No    Comment: 05-11-2016 per pt no  . Sexual activity: Yes  Lifestyle  . Physical activity:    Days per week: Not on file    Minutes per session: Not on file  . Stress: Not on file  Relationships  . Social connections:    Talks on phone: Not on file    Gets together: Not on file    Attends religious service: Not on file    Active member of club or organization: Not on file    Attends meetings of clubs or organizations: Not on file    Relationship status: Not on file  . Intimate partner violence:    Fear of current or ex partner: Not on file    Emotionally abused: Not on file    Physically abused: Not on file    Forced sexual activity: Not on file  Other Topics Concern  . Not on file  Social History Narrative   ** Merged History Encounter **       ENT physician in [place].     Allergies  Allergen Reactions  . Aldactone [Spironolactone]     Breast tissue enlargement  . Codeine Other (See Comments)    Headache   . Lisinopril Cough  . Nexium [Esomeprazole Magnesium] Diarrhea  . Sulfa Antibiotics Rash  .  Sulfa Drugs Cross Reactors Rash     Outpatient Medications Prior to Visit  Medication Sig Dispense Refill  . albuterol (PROVENTIL HFA;VENTOLIN HFA) 108 (90 Base) MCG/ACT inhaler Inhale 2 puffs into the lungs every 6 (six) hours as needed for wheezing or shortness of breath. 1 Inhaler 2  . allopurinol (ZYLOPRIM) 100 MG tablet Take 100 mg by mouth 2 (two) times daily.     Marland Kitchen atorvastatin (LIPITOR) 10 MG tablet Take 10 mg by mouth every evening.     . beclomethasone (QVAR) 80 MCG/ACT inhaler Inhale 1 puff into the lungs 2 (two) times daily.    Marland Kitchen CIALIS 20 MG tablet Take 20 mg by mouth daily as needed for erectile dysfunction.   11  . diltiazem (CARDIZEM CD) 360 MG 24 hr capsule Take 360 mg by mouth every evening.  7  . eplerenone (INSPRA) 50 MG tablet Take 50 mg by mouth 2 (two) times daily.    Marland Kitchen FIBER ADULT GUMMIES PO Take 2 tablets by mouth 2 (two) times daily.     Marland Kitchen ibuprofen (ADVIL,MOTRIN) 200 MG tablet Take 200-400 mg by mouth daily as needed for headache or moderate pain.     Marland Kitchen loperamide (IMODIUM A-D) 2 MG tablet Take 2 mg by mouth 2 (two) times daily.    Marland Kitchen losartan (COZAAR) 100 MG tablet Take 100 mg by mouth every evening.    Marland Kitchen omeprazole (PRILOSEC) 40 MG capsule Take 40 mg by mouth daily before supper.     Marland Kitchen oxybutynin (DITROPAN-XL) 10 MG 24 hr tablet Take 10 mg by mouth daily.    . Potassium 99 MG TABS Take 198 mg by mouth 2 (two) times daily.    . rivaroxaban (XARELTO) 20 MG TABS tablet TAKE 1 TABLET(20 MG) BY MOUTH DAILY WITH SUPPER 30 tablet 0  . sertraline (ZOLOFT) 50 MG tablet Take 1 tablet (50 mg total) by mouth every evening. 90 tablet 1  .  vitamin B-12 (CYANOCOBALAMIN) 1000 MCG tablet Take 1,000 mcg by mouth daily.     No facility-administered medications prior to visit.     Review of Systems  Constitutional: Negative for chills, fever, malaise/fatigue and weight loss.  HENT: Negative for hearing loss, sore throat and tinnitus.   Eyes: Negative for blurred vision and double  vision.  Respiratory: Positive for cough, shortness of breath and wheezing. Negative for hemoptysis, sputum production and stridor.   Cardiovascular: Negative for chest pain, palpitations, orthopnea, leg swelling and PND.  Gastrointestinal: Negative for abdominal pain, constipation, diarrhea, heartburn, nausea and vomiting.  Genitourinary: Negative for dysuria, hematuria and urgency.  Musculoskeletal: Negative for joint pain and myalgias.  Skin: Negative for itching and rash.  Neurological: Negative for dizziness, tingling, weakness and headaches.  Endo/Heme/Allergies: Negative for environmental allergies. Does not bruise/bleed easily.  Psychiatric/Behavioral: Negative for depression. The patient is not nervous/anxious and does not have insomnia.   All other systems reviewed and are negative.    Objective:  Physical Exam Vitals signs reviewed.  Constitutional:      General: He is not in acute distress.    Appearance: He is well-developed.  HENT:     Head: Normocephalic and atraumatic.  Eyes:     General: No scleral icterus.    Conjunctiva/sclera: Conjunctivae normal.     Pupils: Pupils are equal, round, and reactive to light.  Neck:     Musculoskeletal: Neck supple.     Vascular: No JVD.     Trachea: No tracheal deviation.  Cardiovascular:     Rate and Rhythm: Normal rate and regular rhythm.     Heart sounds: Normal heart sounds. No murmur.  Pulmonary:     Effort: Pulmonary effort is normal. No tachypnea, accessory muscle usage or respiratory distress.     Breath sounds: Normal breath sounds. No stridor. No wheezing, rhonchi or rales.  Abdominal:     General: Bowel sounds are normal. There is no distension.     Palpations: Abdomen is soft.     Tenderness: There is no abdominal tenderness.  Musculoskeletal:        General: No tenderness.  Lymphadenopathy:     Cervical: No cervical adenopathy.  Skin:    General: Skin is warm and dry.     Capillary Refill: Capillary refill  takes less than 2 seconds.     Findings: No rash.  Neurological:     Mental Status: He is alert and oriented to person, place, and time.  Psychiatric:        Behavior: Behavior normal.      Vitals:   08/31/18 0931  BP: 118/80  Pulse: 74  SpO2: 93%  Weight: 236 lb (107 kg)  Height: 6\' 2"  (1.88 m)   93% on  RA BMI Readings from Last 3 Encounters:  08/31/18 30.30 kg/m  08/25/18 32.03 kg/m  08/24/18 31.90 kg/m   Wt Readings from Last 3 Encounters:  08/31/18 236 lb (107 kg)  08/25/18 242 lb 12.8 oz (110.1 kg)  08/24/18 241 lb 12.8 oz (109.7 kg)     CBC    Component Value Date/Time   WBC 10.0 08/17/2018 0253   RBC 4.70 08/17/2018 0253   HGB 12.9 (L) 08/17/2018 0253   HGB 13.8 07/20/2018 1313   HCT 40.8 08/17/2018 0253   HCT 42.0 07/20/2018 1313   PLT 208 08/17/2018 0253   PLT 269 07/20/2018 1313   MCV 86.8 08/17/2018 0253   MCV 87 07/20/2018 1313   MCH 27.4  08/17/2018 0253   MCHC 31.6 08/17/2018 0253   RDW 15.7 (H) 08/17/2018 0253   RDW 15.2 07/20/2018 1313   LYMPHSABS 2.1 08/16/2018 1815   MONOABS 0.9 08/16/2018 1815   EOSABS 0.1 08/16/2018 1815   BASOSABS 0.1 08/16/2018 1815     Chest Imaging:  05/31/2018 ventilation perfusion scan: Low probability 05/31/2018 chest x-ray: No acute abnormalities  The patient's images have been independently reviewed by me.    Pulmonary Functions Testing Results: PFT Results Latest Ref Rng & Units 08/29/2018  FVC-Pre L 3.38  FVC-Predicted Pre % 63  FVC-Post L 3.27  FVC-Predicted Post % 61  Pre FEV1/FVC % % 67  Post FEV1/FCV % % 68  FEV1-Pre L 2.28  FEV1-Predicted Pre % 56  FEV1-Post L 2.22  DLCO UNC% % 81  DLCO COR %Predicted % 109  TLC L 6.63  TLC % Predicted % 84  RV % Predicted % 113    FeNO: None   Pathology: None   Echocardiogram:  09/27/2012 TEE: Normal ejection fraction 55 to 60% no PFO no LV thrombus LA thrombus 07/18/2018 transthoracic echo: Normal ejection fraction 50 to 55%  Heart  Catheterization:   07/25/2018:  Left dominant coronary anatomy  Normal left main  Normal LAD  Normal circumflex  Nondominant normal right coronary  Normal LVEDP, 16 mmHg.  Echocardiogram done several days ago documented EF 55%.  Abnormal EKG does not reflect prior infarction/ischemic injury to the heart. RECOMMENDATIONS:  No further ischemic evaluation  Current EKG will be the patient's EKG pattern going forward and has no ischemic basis.  Resume Xarelto 07/26/2018.    Assessment & Plan:   DOE (dyspnea on exertion) - Plan: CT Chest High Resolution, B Nat Peptide, B Nat Peptide, Ambulatory Referral for DME, CANCELED: B Nat Peptide  Stage 2 moderate COPD by GOLD classification (HCC)  False positive QuantiFERON-TB Gold test - Plan: QuantiFERON-TB Gold Plus  History of pneumonia  Discussion:  63 year old gentleman, lifelong non-smoker, occasional cigar use randomly throughout the years.  Recent pulmonary function test with moderate obstructive pulmonary disease, FEV1 56% predicted, ratio of 68.  Recent QuantiFERON was completed by different provider which was indeterminate.  Would like to have this repeated.  He was admitted to the hospital for very small infiltrate in the lower lobe consistent with a possible pneumonia.  Patient was treated for this.  We would also need follow-up to ensure resolution of the infiltrate/groundglass nodule.  We will plan for repeat chest imaging at the end of March 2020.  He can follow-up with [place] after the imaging is complete.  I do think that he needs evaluation of his parenchyma.  He is a lifelong non-smoker with evidence of rather obstructed lung disease.  He has had significant injury to the lung in the past before due to a questionable viral/aspiration pneumonia type event that he ended up in the hospital requiring a long stay and ultimately underwent VATS.  One must consider a post inflammatory type small airway disease such as BO but he has no  other symptoms.  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  Therefore will obtain high-resolution CT imaging of the chest with inspiratory expiratory prone and supine positioning.  Today we also reviewed his pulmonary function tests in person.  Without evidence of moderate obstructive disease I have made decision to place him on Trelegy inhaler to see how  his symptoms change.  I think he will benefit from triple therapy.  Greater than 50% of this patient's 40-minute office visit was spent face-to-face discussing above recommendations and treatment plan   Current Outpatient Medications:  .  albuterol (PROVENTIL HFA;VENTOLIN HFA) 108 (90 Base) MCG/ACT inhaler, Inhale 2 puffs into the lungs every 6 (six) hours as needed for wheezing or shortness of breath., Disp: 1 Inhaler, Rfl: 2 .  allopurinol (ZYLOPRIM) 100 MG tablet, Take 100 mg by mouth 2 (two) times daily. , Disp: , Rfl:  .  atorvastatin (LIPITOR) 10 MG tablet, Take 10 mg by mouth every evening. , Disp: , Rfl:  .  beclomethasone (QVAR) 80 MCG/ACT inhaler, Inhale 1 puff into the lungs 2 (two) times daily., Disp: , Rfl:  .  CIALIS 20 MG tablet, Take 20 mg by mouth daily as needed for erectile dysfunction. , Disp: , Rfl: 11 .  diltiazem (CARDIZEM CD) 360 MG 24 hr capsule, Take 360 mg by mouth every evening., Disp: , Rfl: 7 .  eplerenone (INSPRA) 50 MG tablet, Take 50 mg by mouth 2 (two) times daily., Disp: , Rfl:  .  FIBER ADULT GUMMIES PO, Take 2 tablets by mouth 2 (two) times daily. , Disp: , Rfl:  .  ibuprofen (ADVIL,MOTRIN) 200 MG tablet, Take 200-400 mg by mouth daily as needed for headache or moderate pain. , Disp: , Rfl:  .  loperamide (IMODIUM A-D) 2 MG tablet, Take 2 mg by mouth 2 (two) times daily., Disp: , Rfl:  .  losartan (COZAAR) 100 MG tablet, Take 100 mg by mouth every evening., Disp: , Rfl:  .  omeprazole (PRILOSEC)  40 MG capsule, Take 40 mg by mouth daily before supper. , Disp: , Rfl:  .  oxybutynin (DITROPAN-XL) 10 MG 24 hr tablet, Take 10 mg by mouth daily., Disp: , Rfl:  .  Potassium 99 MG TABS, Take 198 mg by mouth 2 (two) times daily., Disp: , Rfl:  .  rivaroxaban (XARELTO) 20 MG TABS tablet, TAKE 1 TABLET(20 MG) BY MOUTH DAILY WITH SUPPER, Disp: 30 tablet, Rfl: 0 .  sertraline (ZOLOFT) 50 MG tablet, Take 1 tablet (50 mg total) by mouth every evening., Disp: 90 tablet, Rfl: 1 .  vitamin B-12 (CYANOCOBALAMIN) 1000 MCG tablet, Take 1,000 mcg by mouth daily., Disp: , Rfl:    Garner Nash, DO [facility] 08/31/2018 9:43 AM

## 2018-08-31 NOTE — Telephone Encounter (Signed)
Call made to patient, made aware his nebulizer machine would come from a DME company. Order was sent to [facility]. Voiced understanding. Nothing further is needed at this time.

## 2018-09-01 ENCOUNTER — Telehealth: Payer: Self-pay | Admitting: Pulmonary Disease

## 2018-09-01 DIAGNOSIS — J449 Chronic obstructive pulmonary disease, unspecified: Secondary | ICD-10-CM

## 2018-09-01 NOTE — Telephone Encounter (Signed)
Called and spoke with patient, he stated that during yesterdays visit with BI pulmonary rehab was mentioned. Patient was able to talk this over with his wife and they have decided to go through with pulmonary rehab. I have copied instructions from AVS below. Due to BI being out of the office until 2.10.20 Tammy please advise, thank you.    Instructions      Return in about 8 weeks (around 10/26/2018).  Thank you for visiting Dr. Valeta Harms at [facility]. Today we recommend the following:    Orders Placed This Encounter  Procedures  . CT Chest High Resolution  . B Nat Peptide   Please go to lab to obtain labs that MR ordered.   We are starting you on Trelegy. 1 puff daily.   Return in about 8 weeks (around 10/26/2018). RTC in 8 weeks after HRCT.

## 2018-09-01 NOTE — Telephone Encounter (Signed)
That is fine for pulmonary rehab .  Can place order

## 2018-09-01 NOTE — Telephone Encounter (Signed)
Patient is aware, order has been placed.

## 2018-09-05 LAB — QUANTIFERON-TB GOLD PLUS

## 2018-09-06 LAB — QUANTIFERON-TB GOLD PLUS

## 2018-09-06 LAB — SPECIMEN STATUS REPORT

## 2018-09-08 ENCOUNTER — Telehealth (HOSPITAL_COMMUNITY): Payer: Self-pay

## 2018-09-08 ENCOUNTER — Telehealth: Payer: Self-pay | Admitting: Pulmonary Disease

## 2018-09-08 DIAGNOSIS — J449 Chronic obstructive pulmonary disease, unspecified: Secondary | ICD-10-CM

## 2018-09-08 NOTE — Telephone Encounter (Signed)
I just got this message from [place] with [employer] see below Casey [name], Casey [name], Casey [name]!   Just wanted to update you - our RT is delivering this neb, today. I spoke to Dr. [name] and he is aware we are coming.

## 2018-09-08 NOTE — Telephone Encounter (Signed)
Called and spoke to pt, who is requesting update on nebulizer machine and pulmonary rehab referral. Per our records, order was placed for neb machine on 08/31/98 and pulmonary rehab on 09/01/18. Pt states he has not been contacted regarding neb machine or rehab referral.  PCC's can you guys help with this?

## 2018-09-08 NOTE — Telephone Encounter (Signed)
I am not sure why that code was used. He has a diagnosis of COPD. I resent the order with the correct diagnosis. Judie Bonus

## 2018-09-08 NOTE — Telephone Encounter (Signed)
Referral received from [name] for Pulmonary Rehab with diagnosis of COPD GOLD stage 2. Clinical review of pt follow up appt on 08/31/18 Pulmonary office note. Pt appropriate for scheduling for Pulmonary rehab.  Will forward to support staff for scheduling and verification of insurance eligibility/benefits with pt consent.   Joycelyn Man, RN, BSN Cardiac and Pulmonary Rehab Nurse

## 2018-09-08 NOTE — Telephone Encounter (Signed)
I just called [employer] and spoke with Margreta Journey about the neb machine. She stated that DOE is not a good diagnosis for insurance to pay for the neb machine. Need another Dx code to get approved. She is checking with Jeneen Rinks from [employer] now and will be calling back. Pulmonary Rehab schedule their on appts.

## 2018-09-11 ENCOUNTER — Telehealth: Payer: Self-pay | Admitting: Pulmonary Disease

## 2018-09-11 DIAGNOSIS — J449 Chronic obstructive pulmonary disease, unspecified: Secondary | ICD-10-CM

## 2018-09-11 NOTE — Telephone Encounter (Signed)
Will close message.  Called and spoke with Patient. He received nebulizer from [place]. Nothing further at this time.

## 2018-09-11 NOTE — Telephone Encounter (Signed)
Pt called, asking if he needs to be tested for Alpha-1 antitrypsin deficiency? Dr. Valeta Harms please advise.  Thanks!

## 2018-09-12 NOTE — Addendum Note (Signed)
Addended by: Vivia Ewing on: 09/12/2018 05:16 PM   Modules accepted: Orders

## 2018-09-12 NOTE — Telephone Encounter (Signed)
Per BI:  PCCM: No he has not been screened for this. But is not an unreasonable request.  Okay to place orders for Alpha-1 screening, mutation genetics and quantitiative levels.   Call made to patient, made aware of BI recommendations. Lab orders placed. Nothing further is needed at this time.

## 2018-09-12 NOTE — Telephone Encounter (Signed)
PCCM:  No he has not been screened for this. But is not an unreasonable request.  Okay to place orders for Alpha-1 screening, mutation genetics and quantitiative levels.   [facility] 09/12/2018 1:10 PM

## 2018-09-15 ENCOUNTER — Other Ambulatory Visit: Payer: Managed Care, Other (non HMO)

## 2018-09-15 DIAGNOSIS — J449 Chronic obstructive pulmonary disease, unspecified: Secondary | ICD-10-CM

## 2018-09-15 NOTE — Addendum Note (Signed)
Addended by: Suzzanne Cloud E on: 09/15/2018 03:06 PM   Modules accepted: Orders

## 2018-09-21 ENCOUNTER — Telehealth (HOSPITAL_COMMUNITY): Payer: Self-pay

## 2018-09-21 NOTE — Telephone Encounter (Signed)
Call pt to see if he was interested in the Pulmonary rehab program. Pt expressed interest but stated that his insurance was changing on 3/1 and that he has not received his new card yet and he would get his assistant Lorriane Shire to call with the new insurance info. Casey [name]. Support Rep II

## 2018-09-21 NOTE — Telephone Encounter (Signed)
Called patient to see if he was interested in participating in the Pulmonary Rehab Program. Patient stated yes. Patient will come in for orientation on 10/09/2018 @ 1:30pm and will attend the 1:30pm exercise class.  Pt is coming in to see if this is something he is interested in per RN navigator. Tedra Senegal. Support Rep II

## 2018-09-21 NOTE — Telephone Encounter (Signed)
Pt insurance is active and benefits verified through [employer] Co-pay $0., DED $3,000/0 met, out of pocket $6,000/0 met, co-insurance 0%. no pre-authorization required, REF# [id number]  Will contact patient to see if he is interested in the Cardiac Rehab Program. If interested, patient will need to complete follow up appt. Once completed, patient will be contacted for scheduling upon review by the RN Navigator.

## 2018-09-22 LAB — ALPHA-1 ANTITRYPSIN PHENOTYPE: A-1 Antitrypsin, Ser: 147 mg/dL (ref 83–199)

## 2018-09-23 LAB — ALPHA-1-ANTITRYPSIN DEFICIENCY

## 2018-09-26 ENCOUNTER — Other Ambulatory Visit: Payer: Self-pay

## 2018-09-26 MED ORDER — FLUTICASONE-UMECLIDIN-VILANT 100-62.5-25 MCG/INH IN AEPB: 1.0000 | INHALATION_SPRAY | Freq: Every day | RESPIRATORY_TRACT | 5 refills | Status: AC

## 2018-09-27 ENCOUNTER — Telehealth: Payer: Self-pay | Admitting: Pulmonary Disease

## 2018-09-27 DIAGNOSIS — R059 Cough, unspecified: Secondary | ICD-10-CM

## 2018-09-27 DIAGNOSIS — R05 Cough: Secondary | ICD-10-CM

## 2018-09-27 NOTE — Telephone Encounter (Signed)
I have received an auth from [place] this is [id number] however Casey [name] was told by the pt he does not have [place] anymore even though we e-verified it  I will contact pt to get new insurance info Joellen [place]

## 2018-10-09 ENCOUNTER — Telehealth (HOSPITAL_COMMUNITY): Payer: Self-pay

## 2018-10-09 ENCOUNTER — Inpatient Hospital Stay (HOSPITAL_COMMUNITY): Admission: RE | Admit: 2018-10-09 | Payer: Self-pay | Source: Ambulatory Visit

## 2018-10-09 NOTE — Telephone Encounter (Signed)
Pt called and informed pt of Cardiac and Pulmonary rehab closure for the next 2 weeks. Pt informed that orientation to rehab will be rescheduled.  Joycelyn Man RN, BSN Cardiac and Pulmonary Rehab RN

## 2018-10-12 ENCOUNTER — Telehealth: Payer: Self-pay | Admitting: Pulmonary Disease

## 2018-10-12 NOTE — Telephone Encounter (Signed)
Called and spoke with patient he is aware and verbalized understanding. Nothing further needed.  

## 2018-10-12 NOTE — Telephone Encounter (Signed)
PCCM: I would recommended wearing a mask and keeping the space well ventilated. Garner Nash, DO [facility] 10/12/2018 3:31 PM

## 2018-10-12 NOTE — Telephone Encounter (Signed)
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 He wanted to know if the paint fumes would be bad for him and his condition.   BI please advise, thank you.

## 2018-10-12 NOTE — Telephone Encounter (Signed)
Cc: triage pool  Please see previous note.

## 2018-10-12 NOTE — Telephone Encounter (Signed)
Called patient unable to reach LMTCB 

## 2018-10-23 ENCOUNTER — Telehealth: Payer: Self-pay | Admitting: Pulmonary Disease

## 2018-10-23 NOTE — Telephone Encounter (Signed)
Understand the patient is questions.  This sounds like a very low risk situation.  In the future the patient can practice's social distancing remaining 6 to 8 feet apart.  Patient can wear a mask if he feels he is at high likelihood of being around somebody who may expel respiratory droplets.  Please also refer the patient to look at the [facility] website as well as the [facility] website regarding COVID-19.  [place] also has a very interactive website as well.    Please review the information listed below:   Coronavirus (COVID-19) Are you at risk?  Are you at risk for the Coronavirus (COVID-19)?  To be considered HIGH RISK for Coronavirus (COVID-19), you have to meet the following criteria:  . Traveled to [place], [place], [place], [place] or [place]; or in the [place] to [place], [place], [place], or [place]; and have fever, cough, and shortness of breath within the last 2 weeks of travel OR . Been in close contact with a person diagnosed with COVID-19 within the last 2 weeks and have fever, cough, and shortness of breath . IF YOU DO NOT MEET THESE CRITERIA, YOU ARE CONSIDERED LOW RISK FOR COVID-19.  What to do if you are HIGH RISK for COVID-19?  Marland Kitchen If you are having a medical emergency, call 911. . Seek medical care right away. Before you go to a doctor's office, urgent care or emergency department, call ahead and tell them about your recent travel, contact with someone diagnosed with COVID-19, and your symptoms. You should receive instructions from your physician's office regarding next steps of care.  . When you arrive at healthcare provider, tell the healthcare staff immediately you have returned from visiting [place], [place], [place], [place] or [place]; or traveled in the [place] to [place], [place], [place], or [place]; in the last two weeks or you have been in close contact with a person diagnosed with COVID-19 in the last 2 weeks.   . Tell the health care staff about  your symptoms: fever, cough and shortness of breath. . After you have been seen by a medical provider, you will be either: o Tested for (COVID-19) and discharged home on quarantine except to seek medical care if symptoms worsen, and asked to  - Stay home and avoid contact with others until you get your results (4-5 days)  - Avoid travel on public transportation if possible (such as bus, train, or airplane) or o Sent to the Emergency Department by EMS for evaluation, COVID-19 testing, and possible admission depending on your condition and test results.  What to do if you are LOW RISK for COVID-19?  Reduce your risk of any infection by using the same precautions used for avoiding the common cold or flu:  Marland Kitchen Wash your hands often with soap and warm water for at least 20 seconds.  If soap and water are not readily available, use an alcohol-based hand sanitizer with at least 60% alcohol.  . If coughing or sneezing, cover your mouth and nose by coughing or sneezing into the elbow areas of your shirt or coat, into a tissue or into your sleeve (not your hands). . Avoid shaking hands with others and consider head nods or verbal greetings only. . Avoid touching your eyes, nose, or mouth with unwashed hands.  . Avoid close contact with people who are sick. . Avoid places or events with large numbers of people in one location, like concerts or sporting events. Marland Kitchen  Carefully consider travel plans you have or are making. . If you are planning any travel outside or inside the [place], visit the CDC's Travelers' Health webpage for the latest health notices. . If you have some symptoms but not all symptoms, continue to monitor at home and seek medical attention if your symptoms worsen. . If you are having a medical emergency, call 911.   [place] / e-Visit: [email or web address]         [facility]:  [phone]  [facility]: [phone]                   [facility]: [phone]       This information can also be printed and mailed to the patient if you would like.   Wyn Quaker FNP

## 2018-10-23 NOTE — Telephone Encounter (Signed)
Call made to patient, made aware of recommendations per BM. Voiced understanding. Nothing further is needed at this time.

## 2018-10-23 NOTE — Telephone Encounter (Signed)
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 He forgot to wear his mask and gloves. The yard guy has not been out of the country and he has not been around anyone sick. He does not have any symptoms, has not been around anyone but just wanted to check and see if he should be taking any extra precautions.  BM please advise.

## 2018-10-24 ENCOUNTER — Inpatient Hospital Stay: Admission: RE | Admit: 2018-10-24 | Payer: Self-pay | Source: Ambulatory Visit

## 2018-10-25 ENCOUNTER — Telehealth: Payer: Self-pay | Admitting: Pulmonary Disease

## 2018-10-25 ENCOUNTER — Ambulatory Visit (INDEPENDENT_AMBULATORY_CARE_PROVIDER_SITE_OTHER)
Admission: RE | Admit: 2018-10-25 | Discharge: 2018-10-25 | Disposition: A | Discharge status: Home / Self Care | Payer: PRIVATE HEALTH INSURANCE | Source: Ambulatory Visit | Attending: Pulmonary Disease | Admitting: Pulmonary Disease

## 2018-10-25 ENCOUNTER — Encounter (INDEPENDENT_AMBULATORY_CARE_PROVIDER_SITE_OTHER): Payer: Self-pay

## 2018-10-25 ENCOUNTER — Other Ambulatory Visit: Payer: Self-pay | Admitting: Pulmonary Disease

## 2018-10-25 ENCOUNTER — Other Ambulatory Visit: Payer: Self-pay

## 2018-10-25 DIAGNOSIS — R911 Solitary pulmonary nodule: Secondary | ICD-10-CM

## 2018-10-25 DIAGNOSIS — IMO0001 Reserved for inherently not codable concepts without codable children: Secondary | ICD-10-CM

## 2018-10-25 DIAGNOSIS — R0609 Other forms of dyspnea: Secondary | ICD-10-CM | POA: Diagnosis not present

## 2018-10-25 DIAGNOSIS — R918 Other nonspecific abnormal finding of lung field: Secondary | ICD-10-CM

## 2018-10-25 NOTE — Telephone Encounter (Signed)
Patient wanted to know this info again I explained nothing further need at this time. Note    Understand the patient is questions.  This sounds like a very low risk situation.  In the future the patient can practice's social distancing remaining 6 to 8 feet apart.  Patient can wear a mask if he feels he is at high likelihood of being around somebody who may expel respiratory droplets.  Please also refer the patient to look at the [facility] website as well as the [facility] website regarding COVID-19.  [place] also has a very interactive website as well.    Please review the information listed below:   Coronavirus (COVID-19) Are you at risk?  Are you at risk for the Coronavirus (COVID-19)?  To be considered HIGH RISK for Coronavirus (COVID-19), you have to meet the following criteria:   Traveled to [place], [place], [place], [place] or [place]; or in the [place] to [place], [place], [place], or [place]; and have fever, cough, and shortness of breath within the last 2 weeks of travel OR  Been in close contact with a person diagnosed with COVID-19 within the last 2 weeks and have fever, cough, and shortness of breath  IF YOU DO NOT MEET THESE CRITERIA, YOU ARE CONSIDERED LOW RISK FOR COVID-19.  What to do if you are HIGH RISK for COVID-19?   If you are having a medical emergency, call 911.  Seek medical care right away. Before you go to a doctor's office, urgent care or emergency department, call ahead and tell them about your recent travel, contact with someone diagnosed with COVID-19, and your symptoms. You should receive instructions from your physician's office regarding next steps of care.   When you arrive at healthcare provider, tell the healthcare staff immediately you have returned from visiting [place], [place], [place], [place] or [place]; or traveled in the [place] to [place], [place], [place], or [place]; in the last two weeks or you have been in close  contact with a person diagnosed with COVID-19 in the last 2 weeks.  Tell the health care staff about your symptoms: fever, cough and shortness of breath.  After you have been seen by a medical provider, you will be either: ? Tested for (COVID-19) and discharged home on quarantine except to seek medical care if symptoms worsen, and asked to   Stay home and avoid contact with others until you get your results (4-5 days)  Avoid travel on public transportation if possible (such as bus, train,or airplane) or ? Sent to the Emergency Department by EMS for evaluation, COVID-19 testing, and possible admission depending on your condition and test results.  What to do if you are LOW RISK for COVID-19?  Reduce your risk of any infection by using the same precautions used for avoiding the common cold or flu:  Wash your hands often with soap and warm water for at least 20 seconds.  If soap and water are not readily available, use an alcohol-based hand sanitizer with at least 60% alcohol.   If coughing or sneezing, cover your mouth and nose by coughing or sneezing into the elbow areas of your shirt or coat, into a tissue or into your sleeve (not your hands).  Avoid shaking hands with others and consider head nods or verbal greetings only.  Avoid touching your eyes,nose, or mouth with unwashed hands.  Avoid close contact with people who are sick.  Avoid places or events with large  numbers of people in one location, like concerts or sporting events.  Carefully consider travel plans you have or are making.  If you are planning any travel outside or inside the [place], visit the CDC'sTravelers' Health webpagefor the latest health notices.  If you have some symptoms but not all symptoms, continue to monitor at home and seek medical attention if your symptoms worsen.  If you are having a medical emergency, call 911.   [place] /  e-Visit: [email or web address]         [facility]: [phone]  [facility]: [phone]                   [facility]: [phone]       This information can also be printed and mailed to the patient if you would like.   Wyn Quaker FNP

## 2018-11-14 ENCOUNTER — Telehealth: Payer: Self-pay | Admitting: Pulmonary Disease

## 2018-11-14 DIAGNOSIS — Z8709 Personal history of other diseases of the respiratory system: Secondary | ICD-10-CM

## 2018-11-14 NOTE — Telephone Encounter (Signed)
PCCM:  Yes he should be able to return to work with use of appropriate PPE for any patients that are under investigation for COVID-19.   There is new antibody testing available. It just came out this week. We will attempt to order them for him if they are available. I am not sure if there are any restrictions on the lab order for this at this time.    [facility] 11/14/2018 12:12 PM

## 2018-11-14 NOTE — Telephone Encounter (Signed)
PCCM:  Ok to re-order [place], DO [facility] 11/14/2018 3:32 PM

## 2018-11-14 NOTE — Telephone Encounter (Signed)
Called and spoke with pt.  Pt stated he has been out of work since 10/09/2018 when COVID was beginning to get bad.  Pt denies any current symptoms of fever, cough, SOB, no body aches or chills.  Pt denies any recent travel and states he has not been around anyone that has been sick.  Since pt has no symptoms and with how COVID currently is, pt is wanting to know if he should return to work and if it would be okay.  Pt is also wanting to know if there is any way titers could be done to see if he possibly did have COVID back in January 2020. Pt saw MR 08/24/2018 for acute bronchitis after having SOB since October 2019.  Dr. Valeta Harms, please advise on this for pt. Thanks!

## 2018-11-14 NOTE — Telephone Encounter (Signed)
Call made to patient, made aware lab orders have been placed and he is able to come in at anytime to have them drawn. Voiced understanding. Nothing further is needed at this time.

## 2018-11-14 NOTE — Telephone Encounter (Signed)
PCCM:  Orders Placed This Encounter  Procedures  . SAR CoV2 Serology (COVID 19)AB(IGG)IA    Standing Status:   Future    Standing Expiration Date:   11/14/2019   Orders placed for IgG testing  Garner Nash, DO [facility] 11/14/2018 12:22 PM

## 2018-11-14 NOTE — Telephone Encounter (Signed)
Called the patient and advised him the test for COVID (IGG) antibody was ordered. The patient will come to our [place] location for lab work.  The patient asked if he can also get the Quantiferon Gold TB test done as well. Patient stated this test is done by [facility] and he would like to get both done at the same time if possible.  I advised the patient his request would have to be forwarded to Dr. Valeta Harms   Message forwarded to Dr. Valeta Harms.   Dr. Valeta Harms, please take a look at the request above from the patient. Is the TB test that you are willing to order for him?

## 2018-11-15 ENCOUNTER — Other Ambulatory Visit: Payer: PRIVATE HEALTH INSURANCE

## 2018-11-15 ENCOUNTER — Other Ambulatory Visit: Payer: Self-pay

## 2018-11-15 DIAGNOSIS — R059 Cough, unspecified: Secondary | ICD-10-CM

## 2018-11-15 DIAGNOSIS — R7612 Nonspecific reaction to cell mediated immunity measurement of gamma interferon antigen response without active tuberculosis: Secondary | ICD-10-CM

## 2018-11-15 DIAGNOSIS — R6889 Other general symptoms and signs: Secondary | ICD-10-CM

## 2018-11-15 DIAGNOSIS — R05 Cough: Secondary | ICD-10-CM

## 2018-11-15 DIAGNOSIS — Z8709 Personal history of other diseases of the respiratory system: Secondary | ICD-10-CM

## 2018-11-17 LAB — QUANTIFERON-TB GOLD PLUS
Mitogen-NIL: 7.32 IU/mL
NIL: 0.03 IU/mL
QuantiFERON-TB Gold Plus: NEGATIVE
TB1-NIL: <0 IU/mL
TB2-NIL: <0 IU/mL

## 2018-11-17 LAB — SAR COV2 SEROLOGY (COVID19)AB(IGG),IA: SARS CoV2 AB IGG: NEGATIVE

## 2018-11-28 ENCOUNTER — Telehealth: Payer: Self-pay | Admitting: Pulmonary Disease

## 2018-11-28 MED ORDER — FLUTICASONE-UMECLIDIN-VILANT 100-62.5-25 MCG/INH IN AEPB: 1.0000 | INHALATION_SPRAY | Freq: Every day | RESPIRATORY_TRACT | 3 refills | Status: DC

## 2018-11-28 NOTE — Telephone Encounter (Signed)
Called and spoke with patient regarding refill request for trelegy inhaler Pt is needing this with refills available to go to [facility] outpatient pharmacy This was sent today Nothing further needed.

## 2019-02-05 ENCOUNTER — Telehealth: Payer: Self-pay | Admitting: Pulmonary Disease

## 2019-02-05 NOTE — Telephone Encounter (Signed)
Call returned to patient, he is wanting to know if he is getting PFT's at his upcoming appt. I made him aware per most recent AVS he will not be getting a PFT. Voiced understanding. Nothing further is needed at this time.

## 2019-03-13 ENCOUNTER — Other Ambulatory Visit: Payer: Self-pay

## 2019-03-13 DIAGNOSIS — Z20822 Contact with and (suspected) exposure to covid-19: Secondary | ICD-10-CM

## 2019-03-14 ENCOUNTER — Inpatient Hospital Stay (HOSPITAL_COMMUNITY)
Admission: EM | Admit: 2019-03-14 | Discharge: 2019-03-17 | DRG: 175 | Disposition: A | Discharge status: Home / Self Care | Payer: PRIVATE HEALTH INSURANCE | Source: Ambulatory Visit | Attending: Pulmonary Disease | Admitting: Pulmonary Disease

## 2019-03-14 ENCOUNTER — Emergency Department (HOSPITAL_COMMUNITY): Payer: PRIVATE HEALTH INSURANCE

## 2019-03-14 ENCOUNTER — Emergency Department (HOSPITAL_BASED_OUTPATIENT_CLINIC_OR_DEPARTMENT_OTHER): Payer: PRIVATE HEALTH INSURANCE

## 2019-03-14 ENCOUNTER — Inpatient Hospital Stay (HOSPITAL_COMMUNITY): Payer: PRIVATE HEALTH INSURANCE

## 2019-03-14 ENCOUNTER — Encounter (HOSPITAL_COMMUNITY): Payer: Self-pay | Admitting: Emergency Medicine

## 2019-03-14 ENCOUNTER — Other Ambulatory Visit: Payer: Self-pay

## 2019-03-14 DIAGNOSIS — Z8249 Family history of ischemic heart disease and other diseases of the circulatory system: Secondary | ICD-10-CM

## 2019-03-14 DIAGNOSIS — I2602 Saddle embolus of pulmonary artery with acute cor pulmonale: Secondary | ICD-10-CM

## 2019-03-14 DIAGNOSIS — Z79899 Other long term (current) drug therapy: Secondary | ICD-10-CM | POA: Diagnosis not present

## 2019-03-14 DIAGNOSIS — N179 Acute kidney failure, unspecified: Secondary | ICD-10-CM | POA: Diagnosis present

## 2019-03-14 DIAGNOSIS — I82442 Acute embolism and thrombosis of left tibial vein: Secondary | ICD-10-CM | POA: Diagnosis present

## 2019-03-14 DIAGNOSIS — R402362 Coma scale, best motor response, obeys commands, at arrival to emergency department: Secondary | ICD-10-CM | POA: Diagnosis present

## 2019-03-14 DIAGNOSIS — E785 Hyperlipidemia, unspecified: Secondary | ICD-10-CM | POA: Diagnosis present

## 2019-03-14 DIAGNOSIS — Z20828 Contact with and (suspected) exposure to other viral communicable diseases: Secondary | ICD-10-CM | POA: Diagnosis present

## 2019-03-14 DIAGNOSIS — Z885 Allergy status to narcotic agent status: Secondary | ICD-10-CM | POA: Diagnosis not present

## 2019-03-14 DIAGNOSIS — I2699 Other pulmonary embolism without acute cor pulmonale: Secondary | ICD-10-CM | POA: Diagnosis present

## 2019-03-14 DIAGNOSIS — Z888 Allergy status to other drugs, medicaments and biological substances status: Secondary | ICD-10-CM

## 2019-03-14 DIAGNOSIS — Z8672 Personal history of thrombophlebitis: Secondary | ICD-10-CM

## 2019-03-14 DIAGNOSIS — I129 Hypertensive chronic kidney disease with stage 1 through stage 4 chronic kidney disease, or unspecified chronic kidney disease: Secondary | ICD-10-CM | POA: Diagnosis present

## 2019-03-14 DIAGNOSIS — Z8679 Personal history of other diseases of the circulatory system: Secondary | ICD-10-CM | POA: Diagnosis not present

## 2019-03-14 DIAGNOSIS — Z86718 Personal history of other venous thrombosis and embolism: Secondary | ICD-10-CM

## 2019-03-14 DIAGNOSIS — I4891 Unspecified atrial fibrillation: Secondary | ICD-10-CM

## 2019-03-14 DIAGNOSIS — I10 Essential (primary) hypertension: Secondary | ICD-10-CM

## 2019-03-14 DIAGNOSIS — M7989 Other specified soft tissue disorders: Secondary | ICD-10-CM

## 2019-03-14 DIAGNOSIS — Z7951 Long term (current) use of inhaled steroids: Secondary | ICD-10-CM | POA: Diagnosis not present

## 2019-03-14 DIAGNOSIS — N182 Chronic kidney disease, stage 2 (mild): Secondary | ICD-10-CM | POA: Diagnosis present

## 2019-03-14 DIAGNOSIS — I82502 Chronic embolism and thrombosis of unspecified deep veins of left lower extremity: Secondary | ICD-10-CM

## 2019-03-14 DIAGNOSIS — J449 Chronic obstructive pulmonary disease, unspecified: Secondary | ICD-10-CM | POA: Diagnosis present

## 2019-03-14 DIAGNOSIS — R402142 Coma scale, eyes open, spontaneous, at arrival to emergency department: Secondary | ICD-10-CM | POA: Diagnosis present

## 2019-03-14 DIAGNOSIS — K219 Gastro-esophageal reflux disease without esophagitis: Secondary | ICD-10-CM | POA: Diagnosis present

## 2019-03-14 DIAGNOSIS — I454 Nonspecific intraventricular block: Secondary | ICD-10-CM | POA: Diagnosis present

## 2019-03-14 DIAGNOSIS — Q613 Polycystic kidney, unspecified: Secondary | ICD-10-CM | POA: Diagnosis not present

## 2019-03-14 DIAGNOSIS — R55 Syncope and collapse: Secondary | ICD-10-CM | POA: Diagnosis present

## 2019-03-14 DIAGNOSIS — M109 Gout, unspecified: Secondary | ICD-10-CM | POA: Diagnosis present

## 2019-03-14 DIAGNOSIS — I361 Nonrheumatic tricuspid (valve) insufficiency: Secondary | ICD-10-CM | POA: Diagnosis not present

## 2019-03-14 DIAGNOSIS — Z882 Allergy status to sulfonamides status: Secondary | ICD-10-CM

## 2019-03-14 DIAGNOSIS — R402252 Coma scale, best verbal response, oriented, at arrival to emergency department: Secondary | ICD-10-CM | POA: Diagnosis present

## 2019-03-14 LAB — ECHOCARDIOGRAM COMPLETE: Weight: 3680 oz

## 2019-03-14 LAB — CBC WITH DIFFERENTIAL/PLATELET
Abs Immature Granulocytes: 0.09 10*3/uL — ABNORMAL HIGH (ref 0.00–0.07)
Basophils Absolute: 0.1 10*3/uL (ref 0.0–0.1)
Basophils Relative: 1 %
Eosinophils Absolute: 0.2 10*3/uL (ref 0.0–0.5)
Eosinophils Relative: 1 %
HCT: 44.4 % (ref 39.0–52.0)
Hemoglobin: 14.1 g/dL (ref 13.0–17.0)
Immature Granulocytes: 1 %
Lymphocytes Relative: 11 %
Lymphs Abs: 1.2 10*3/uL (ref 0.7–4.0)
MCH: 28.4 pg (ref 26.0–34.0)
MCHC: 31.8 g/dL (ref 30.0–36.0)
MCV: 89.5 fL (ref 80.0–100.0)
Monocytes Absolute: 0.7 10*3/uL (ref 0.1–1.0)
Monocytes Relative: 6 %
Neutro Abs: 8.7 10*3/uL — ABNORMAL HIGH (ref 1.7–7.7)
Neutrophils Relative %: 80 %
Platelets: 182 10*3/uL (ref 150–400)
RBC: 4.96 MIL/uL (ref 4.22–5.81)
RDW: 15.9 % — ABNORMAL HIGH (ref 11.5–15.5)
WBC: 10.9 10*3/uL — ABNORMAL HIGH (ref 4.0–10.5)
nRBC: 0 % (ref 0.0–0.2)

## 2019-03-14 LAB — COMPREHENSIVE METABOLIC PANEL
ALT: 19 U/L (ref 0–44)
AST: 22 U/L (ref 15–41)
Albumin: 3.7 g/dL (ref 3.5–5.0)
Alkaline Phosphatase: 73 U/L (ref 38–126)
Anion gap: 11 (ref 5–15)
BUN: 31 mg/dL — ABNORMAL HIGH (ref 8–23)
CO2: 23 mmol/L (ref 22–32)
Calcium: 9.4 mg/dL (ref 8.9–10.3)
Chloride: 106 mmol/L (ref 98–111)
Creatinine, Ser: 1.59 mg/dL — ABNORMAL HIGH (ref 0.61–1.24)
GFR calc Af Amer: 52 mL/min — ABNORMAL LOW (ref >60)
GFR calc non Af Amer: 45 mL/min — ABNORMAL LOW (ref >60)
Glucose, Bld: 136 mg/dL — ABNORMAL HIGH (ref 70–99)
Potassium: 3.7 mmol/L (ref 3.5–5.1)
Sodium: 140 mmol/L (ref 135–145)
Total Bilirubin: 1 mg/dL (ref 0.3–1.2)
Total Protein: 7.1 g/dL (ref 6.5–8.1)

## 2019-03-14 LAB — CBG MONITORING, ED: Glucose-Capillary: 144 mg/dL — ABNORMAL HIGH (ref 70–99)

## 2019-03-14 LAB — HEPARIN LEVEL (UNFRACTIONATED): Heparin Unfractionated: 0.31 IU/mL (ref 0.30–0.70)

## 2019-03-14 LAB — TROPONIN I (HIGH SENSITIVITY)
Troponin I (High Sensitivity): 21 ng/L — ABNORMAL HIGH (ref <18)
Troponin I (High Sensitivity): 61 ng/L — ABNORMAL HIGH (ref <18)
Troponin I (High Sensitivity): 84 ng/L — ABNORMAL HIGH (ref <18)

## 2019-03-14 LAB — PROTIME-INR
INR: 1.1 (ref 0.8–1.2)
Prothrombin Time: 13.9 seconds (ref 11.4–15.2)

## 2019-03-14 LAB — NOVEL CORONAVIRUS, NAA: SARS-CoV-2, NAA: NOT DETECTED

## 2019-03-14 LAB — SARS CORONAVIRUS 2 BY RT PCR (HOSPITAL ORDER, PERFORMED IN ~~LOC~~ HOSPITAL LAB): SARS Coronavirus 2: NEGATIVE

## 2019-03-14 LAB — PHOSPHORUS: Phosphorus: 2.7 mg/dL (ref 2.5–4.6)

## 2019-03-14 LAB — D-DIMER, QUANTITATIVE: D-Dimer, Quant: 15.75 ug/mL-FEU — ABNORMAL HIGH (ref 0.00–0.50)

## 2019-03-14 LAB — MAGNESIUM: Magnesium: 1.7 mg/dL (ref 1.7–2.4)

## 2019-03-14 MED ORDER — HEPARIN (PORCINE) 25000 UT/250ML-% IV SOLN
2000.0000 [IU]/h | INTRAVENOUS | Status: DC
  Administered 2019-03-14: 1650 [IU]/h via INTRAVENOUS
  Administered 2019-03-15 – 2019-03-16 (×3): 1750 [IU]/h via INTRAVENOUS
  Filled 2019-03-14 (×4): qty 250

## 2019-03-14 MED ORDER — ATORVASTATIN CALCIUM 10 MG PO TABS
10.0000 mg | ORAL_TABLET | Freq: Every day | ORAL | Status: DC
  Administered 2019-03-15 – 2019-03-16 (×2): 10 mg via ORAL
  Filled 2019-03-14 (×2): qty 1

## 2019-03-14 MED ORDER — ALBUTEROL SULFATE (2.5 MG/3ML) 0.083% IN NEBU: 2.5000 mg | INHALATION_SOLUTION | RESPIRATORY_TRACT | Status: DC | PRN

## 2019-03-14 MED ORDER — IOHEXOL 350 MG/ML SOLN
100.0000 mL | Freq: Once | INTRAVENOUS | Status: AC | PRN
  Administered 2019-03-14: 42 mL via INTRAVENOUS

## 2019-03-14 MED ORDER — LACTATED RINGERS IV SOLN
INTRAVENOUS | Status: DC
  Administered 2019-03-14 – 2019-03-15 (×2): via INTRAVENOUS

## 2019-03-14 MED ORDER — SODIUM CHLORIDE 0.9 % IV SOLN
1000.0000 mL | INTRAVENOUS | Status: DC
  Administered 2019-03-14: 12:00:00 1000 mL via INTRAVENOUS

## 2019-03-14 MED ORDER — HEPARIN BOLUS VIA INFUSION
5500.0000 [IU] | Freq: Once | INTRAVENOUS | Status: AC
  Administered 2019-03-14: 5500 [IU] via INTRAVENOUS
  Filled 2019-03-14: qty 5500

## 2019-03-14 MED ORDER — BECLOMETHASONE DIPROP HFA 80 MCG/ACT IN AERB: 1.0000 | INHALATION_SPRAY | Freq: Two times a day (BID) | RESPIRATORY_TRACT | Status: DC

## 2019-03-14 MED ORDER — SODIUM CHLORIDE 0.9 % IV BOLUS (SEPSIS)
500.0000 mL | Freq: Once | INTRAVENOUS | Status: AC
  Administered 2019-03-14: 500 mL via INTRAVENOUS

## 2019-03-14 MED ORDER — PERFLUTREN LIPID MICROSPHERE
1.0000 mL | INTRAVENOUS | Status: AC | PRN
  Administered 2019-03-14: 2 mL via INTRAVENOUS
  Filled 2019-03-14: qty 10

## 2019-03-14 MED ORDER — UMECLIDINIUM BROMIDE 62.5 MCG/INH IN AEPB
1.0000 | INHALATION_SPRAY | Freq: Every day | RESPIRATORY_TRACT | Status: DC
  Administered 2019-03-15 – 2019-03-17 (×3): 1 via RESPIRATORY_TRACT
  Filled 2019-03-14: qty 7

## 2019-03-14 MED ORDER — FLUTICASONE FUROATE-VILANTEROL 100-25 MCG/INH IN AEPB
1.0000 | INHALATION_SPRAY | Freq: Every day | RESPIRATORY_TRACT | Status: DC
  Administered 2019-03-15 – 2019-03-17 (×3): 1 via RESPIRATORY_TRACT
  Filled 2019-03-14: qty 28

## 2019-03-14 NOTE — ED Notes (Signed)
Patient has been on room air for about 20 minutes  , pt 02 saturations maintained above >93

## 2019-03-14 NOTE — ED Notes (Signed)
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 Yes   Triage Complete: Triage complete  Chief Complaint near syncope  Triage Note Pt arrives via ems with co near syncope episode. Pt had a cold sweat and heavy chest once pt laid down he felt better. Pt also co of left lower leg tenderness with pain when walking today. Pt has hx of superficial blood clot on right leg. Hx of afib takes Cardizem. Pt denies being on a blood thinner currently.   Allergies Allergies  Allergen Reactions  . Aldactone [Spironolactone]     Breast tissue enlargement  . Codeine Other (See Comments)    Headache   . Lisinopril Cough  . Nexium [Esomeprazole Magnesium] Diarrhea  . Sulfa Antibiotics Rash  . Sulfa Drugs Cross Reactors Rash    Level of Care/Admitting Diagnosis ED Disposition    ED Disposition Condition [place] [facility]: [place] [100100]  Level of Care: Progressive [102]  Covid Evaluation: Asymptomatic Screening Protocol (No Symptoms)  Diagnosis: Pulmonary emboli [facility]) [[id number]]  Admitting Physician: Rigoberto Noel [[id number]]  Attending Physician: PCCM, MD [phone]  Estimated length of stay: 3 - 4 days  Certification:: I certify this patient will need inpatient services for at least 2 midnights  PT Class (Do Not Modify): Inpatient [101]  PT Acc Code (Do Not Modify): Private [1]       B Medical/Surgery History Past Medical History:  Diagnosis Date  . 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HCC)    ongoing evaluation for elevated R>L adosterone secretion  . Arthritis   . Atrial enlargement, bilateral   . Atrial fibrillation ([place])   . Atrial fibrillation (HCC)    paroxysmal  . Atrial flutter ([place])   . Complication of anesthesia     could not urinate after UHR, needed cath, mental fog sinc cervical fusion 08-09-16   . Dysrhythmia    a-fib-had ablation x2  . GERD (gastroesophageal reflux disease)   . Hyperaldosteronism ([place])   . Hypertension   . Kidney cysts    "multiple"  . Nephrolithiasis   . Pneumonia 1990's  . Pre-diabetes   . Prostatitis   . Renal insufficiency    "Cr 1.6"   Past Surgical History:  Procedure Laterality Date  . ABLATION    . ATRIAL FIBRILLATION ABLATION N/A 09/28/2012   Procedure: ATRIAL FIBRILLATION ABLATION;  Surgeon: Thompson Grayer, MD;  Location: [facility] CATH LAB;  Service: Cardiovascular;  Laterality: N/A;  . atrial fibrillation ablation with CTI ablation  05/27/11, 09/28/12   ablation for afib and atrial flutter by Dr Rayann Heman  . BACK SURGERY  08/09/2016   C 5 6  C 6 to C 7 cervical fusion  . CARDIOVERSION  07/28/2011   Procedure: CARDIOVERSION;  Surgeon: Lelon Perla, MD;  Location: [place];  Service: Cardiovascular;  Laterality: N/A;  . COLONOSCOPY WITH PROPOFOL N/A 03/03/2015   Procedure: COLONOSCOPY WITH PROPOFOL;  Surgeon: Garlan Fair, MD;  Location: [facility] ENDOSCOPY;  Service: Gastroenterology;  Laterality: N/A;  . double j stent placement  06/2004   left  . ESOPHAGOGASTRODUODENOSCOPY (EGD) WITH PROPOFOL N/A 03/03/2015   Procedure: ESOPHAGOGASTRODUODENOSCOPY (EGD) WITH PROPOFOL;  Surgeon: Garlan Fair, MD;  Location: [facility]OS[facility];  Service: Gastroenterology;  Laterality: N/A;  .  ESOPHAGOGASTRODUODENOSCOPY (EGD) WITH PROPOFOL N/A 10/25/2016   Procedure: ESOPHAGOGASTRODUODENOSCOPY (EGD) WITH PROPOFOL;  Surgeon: Garlan Fair, MD;  Location: [facility]OS[facility];  Service: Endoscopy;  Laterality: N/A;  . INGUINAL HERNIA REPAIR     "as a child; ? right"  . INGUINAL HERNIA REPAIR Left 06/23/2016   Procedure: OPEN LEFT INGUINAL HERNIA  REPAIR WITH MESH;  Surgeon: Johnathan Hausen, MD;  Location: [place];  Service: General;  Laterality: Left;  . INSERTION OF MESH Left 06/23/2016    Procedure: INSERTION OF MESH;  Surgeon: Johnathan Hausen, MD;  Location: [place];  Service: General;  Laterality: Left;  . LEFT HEART CATH AND CORONARY ANGIOGRAPHY N/A 07/25/2018   Procedure: LEFT HEART CATH AND CORONARY ANGIOGRAPHY;  Surgeon: Belva Crome, MD;  Location: [place] CV LAB;  Service: Cardiovascular;  Laterality: N/A;  . LUNG BIOPSY  1993  . NASAL SEPTOPLASTY W/ TURBINOPLASTY  07/2006  . prostate biopsy  2011  . schwannoma removal  ~01/2010   left thorax  . TEE WITHOUT CARDIOVERSION N/A 09/27/2012   Procedure: TRANSESOPHAGEAL ECHOCARDIOGRAM (TEE);  Surgeon: Larey Dresser, MD;  Location: [place];  Service: Cardiovascular;  Laterality: N/A;  . ULTRASOUND GUIDANCE FOR VASCULAR ACCESS  07/25/2018   Procedure: Ultrasound Guidance For Vascular Access;  Surgeon: Belva Crome, MD;  Location: [place] CV LAB;  Service: Cardiovascular;;  . UMBILICAL HERNIA REPAIR  07/2006     A IV Location/Drains/Wounds Patient Lines/Drains/Airways Status   Active Line/Drains/Airways    Name:   Placement date:   Placement time:   Site:   Days:   Peripheral IV 03/14/19 Left Antecubital   03/14/19    0938    Antecubital   less than 1   Peripheral IV 03/14/19 Right Forearm   03/14/19    1619    Forearm   less than 1          Intake/Output Last 24 hours  Intake/Output Summary (Last 24 hours) at 03/14/2019 1620 Last data filed at 03/14/2019 1232 Gross per 24 hour  Intake 500 ml  Output -  Net 500 ml    Labs/Imaging Results for orders placed or performed during the hospital encounter of 03/14/19 (from the past 48 hour(s))  CBG monitoring, ED     Status: Abnormal   Collection Time: 03/14/19  9:28 AM  Result Value Ref Range   Glucose-Capillary 144 (H) 70 - 99 mg/dL  Comprehensive metabolic panel     Status: Abnormal   Collection Time: 03/14/19  9:34 AM  Result Value Ref Range   Sodium 140 135 - 145 mmol/L   Potassium 3.7 3.5 - 5.1 mmol/L   Chloride 106 98 - 111  mmol/L   CO2 23 22 - 32 mmol/L   Glucose, Bld 136 (H) 70 - 99 mg/dL   BUN 31 (H) 8 - 23 mg/dL   Creatinine, Ser 1.59 (H) 0.61 - 1.24 mg/dL   Calcium 9.4 8.9 - 10.3 mg/dL   Total Protein 7.1 6.5 - 8.1 g/dL   Albumin 3.7 3.5 - 5.0 g/dL   AST 22 15 - 41 U/L   ALT 19 0 - 44 U/L   Alkaline Phosphatase 73 38 - 126 U/L   Total Bilirubin 1.0 0.3 - 1.2 mg/dL   GFR calc non Af Amer 45 (L) >60 mL/min   GFR calc Af Amer 52 (L) >60 mL/min   Anion gap 11 5 - 15    Comment: Performed at [facility]  Lab, [address] [place], [place] [address]  Troponin I (High Sensitivity)     Status: Abnormal   Collection Time: 03/14/19  9:34 AM  Result Value Ref Range   Troponin I (High Sensitivity) 21 (H) <18 ng/L    Comment: (NOTE) Elevated high sensitivity troponin I (hsTnI) values and significant  changes across serial measurements may suggest ACS but many other  chronic and acute conditions are known to elevate hsTnI results.  Refer to the "Links" section for chest pain algorithms and additional  guidance. Performed at [facility], [place] [address], [place], [place] [address]   CBC with Differential     Status: Abnormal   Collection Time: 03/14/19  9:34 AM  Result Value Ref Range   WBC 10.9 (H) 4.0 - 10.5 K/uL   RBC 4.96 4.22 - 5.81 MIL/uL   Hemoglobin 14.1 13.0 - 17.0 g/dL   HCT 44.4 39.0 - 52.0 %   MCV 89.5 80.0 - 100.0 fL   MCH 28.4 26.0 - 34.0 pg   MCHC 31.8 30.0 - 36.0 g/dL   RDW 15.9 (H) 11.5 - 15.5 %   Platelets 182 150 - 400 K/uL   nRBC 0.0 0.0 - 0.2 %   Neutrophils Relative % 80 %   Neutro Abs 8.7 (H) 1.7 - 7.7 K/uL   Lymphocytes Relative 11 %   Lymphs Abs 1.2 0.7 - 4.0 K/uL   Monocytes Relative 6 %   Monocytes Absolute 0.7 0.1 - 1.0 K/uL   Eosinophils Relative 1 %   Eosinophils Absolute 0.2 0.0 - 0.5 K/uL   Basophils Relative 1 %   Basophils Absolute 0.1 0.0 - 0.1 K/uL   Immature Granulocytes 1 %   Abs Immature Granulocytes 0.09 (H) 0.00 - 0.07 K/uL    Comment:  Performed at [place] [address] [place], [place] [address]  Protime-INR     Status: None   Collection Time: 03/14/19  9:34 AM  Result Value Ref Range   Prothrombin Time 13.9 11.4 - 15.2 seconds   INR 1.1 0.8 - 1.2    Comment: (NOTE) INR goal varies based on device and disease states. Performed at [facility], [place] [address] [place], [place] [address]   Magnesium     Status: None   Collection Time: 03/14/19  9:34 AM  Result Value Ref Range   Magnesium 1.7 1.7 - 2.4 mg/dL    Comment: Performed at [place] [address] [place], [place] [address]  Phosphorus     Status: None   Collection Time: 03/14/19  9:34 AM  Result Value Ref Range   Phosphorus 2.7 2.5 - 4.6 mg/dL    Comment: Performed at [place] [facility] [address], [place],  [address]  D-dimer, quantitative     Status: Abnormal   Collection Time: 03/14/19  9:34 AM  Result Value Ref Range   D-Dimer, Quant 15.75 (H) 0.00 - 0.50 ug/mL-FEU    Comment: (NOTE) At the manufacturer cut-off of 0.50 ug/mL FEU, this assay has been documented to exclude PE with a sensitivity and negative predictive value of 97 to 99%.  At this time, this assay has not been approved by the FDA to exclude DVT/VTE. Results should be correlated with clinical presentation. Performed at [facility], [place] [address] [place],  [address]   Troponin I (High Sensitivity)     Status: Abnormal   Collection Time: 03/14/19 12:02 PM  Result Value Ref Range   Troponin I (High Sensitivity) 61 (H) <18 ng/L    Comment: RESULT CALLED TO, READ BACK BY AND VERIFIED WITH: Sedra Morfin,A RN @1305  ON [id number] BY FLEMINGS (NOTE) Elevated high sensitivity troponin I (hsTnI) values and significant  changes across serial measurements may suggest ACS but many other  chronic and acute conditions are known to elevate hsTnI results.  Refer to the Links section for chest pain algorithms and additional  guidance. Performed  at [facility], [place] [address], [place], [place] [address]   SARS Coronavirus 2 [facility] order, Performed in [facility] hospital lab) Nasopharyngeal Nasopharyngeal Swab     Status: None   Collection Time: 03/14/19  1:20 PM   Specimen: Nasopharyngeal Swab  Result Value Ref Range   SARS Coronavirus 2 NEGATIVE NEGATIVE    Comment: (NOTE) If result is NEGATIVE SARS-CoV-2 target nucleic acids are NOT DETECTED. The SARS-CoV-2 RNA is generally detectable in upper and lower  respiratory specimens during the acute phase of infection. The lowest  concentration of SARS-CoV-2 viral copies this assay can detect is 250  copies / mL. A negative result does not preclude SARS-CoV-2 infection  and should not be used as the sole basis for treatment or other  patient management decisions.  A negative result may occur with  improper specimen collection / handling, submission of specimen other  than nasopharyngeal swab, presence of viral mutation(s) within the  areas targeted by this assay, and inadequate number of viral copies  (<250 copies / mL). A negative result must be combined with clinical  observations, patient history, and epidemiological information. If result is POSITIVE SARS-CoV-2 target nucleic acids are DETECTED. The SARS-CoV-2 RNA is generally detectable in upper and lower  respiratory specimens dur ing the acute phase of infection.  Positive  results are indicative of active infection with SARS-CoV-2.  Clinical  correlation with patient history and other diagnostic information is  necessary to determine patient infection status.  Positive results do  not rule out bacterial infection or co-infection with other viruses. If result is PRESUMPTIVE POSTIVE SARS-CoV-2 nucleic acids MAY BE PRESENT.   A presumptive positive result was obtained on the submitted specimen  and confirmed on repeat testing.  While 2019 novel coronavirus  (SARS-CoV-2) nucleic acids may be present in the  submitted sample  additional confirmatory testing may be necessary for epidemiological  and / or clinical management purposes  to differentiate between  SARS-CoV-2 and other Sarbecovirus currently known to infect humans.  If clinically indicated additional testing with an alternate test  methodology [phone]) is advised. The SARS-CoV-2 RNA is generally  detectable in upper and lower respiratory sp ecimens during the acute  phase of infection. The expected result is Negative. Fact Sheet for Patients:  StrictlyIdeas.no Fact Sheet for Healthcare Providers: BankingDealers.co.za This test is not yet approved or cleared by the [place] FDA and has been authorized for detection and/or diagnosis of SARS-CoV-2 by FDA under an Emergency Use Authorization (EUA).  This EUA will remain in effect (meaning this test can be used) for the duration of the COVID-19 declaration under Section 564(b)(1) of the Act, 21 U.S.C. section 360bbb-3(b)(1), unless the authorization is terminated or revoked sooner. Performed at [facility], [place] [address], [place], [place] [address]    Ct Angio Chest Pe W/cm &/or Wo Cm  Result Date: 03/14/2019 CLINICAL DATA:  Chest pain. EXAM: CT ANGIOGRAPHY CHEST WITH CONTRAST TECHNIQUE: Multidetector CT imaging of the chest was performed  using the standard protocol during bolus administration of intravenous contrast. Multiplanar CT image reconstructions and MIPs were obtained to evaluate the vascular anatomy. CONTRAST:  71mL OMNIPAQUE IOHEXOL 350 MG/ML SOLN COMPARISON:  CT scan of October 25, 2018. FINDINGS: Cardiovascular: Large linear saddle embolus is seen extending into both pulmonary arteries and their lower lobe branches. RV/LV ratio of 1.2 is noted suggesting right heart strain. Normal cardiac size. No pericardial effusion. Atherosclerosis of thoracic aorta is noted without aneurysm formation. Mediastinum/Nodes: No enlarged  mediastinal, hilar, or axillary lymph nodes. Thyroid gland, trachea, and esophagus demonstrate no significant findings. Lungs/Pleura: 5 mm nodule is noted in left upper lobe best seen on image number 34 series 7; there is interval development surrounding ground-glass opacity. This is not significantly changed compared to prior exam. No pleural effusion or pneumothorax. Upper Abdomen: Multiple large cysts are seen involving both kidneys consistent with polycystic kidney disease. Musculoskeletal: No chest wall abnormality. No acute or significant osseous findings. Review of the MIP images confirms the above findings. IMPRESSION: Large acute pulmonary embolus is noted extending into both pulmonary arteries and their lower lobe branches. Positive for acute PE with CT evidence of right heart strain (RV/LV Ratio = 1.2) consistent with at least submassive (intermediate risk) PE. The presence of right heart strain has been associated with an increased risk of morbidity and mortality. Please activate Code PE by paging [phone]. Critical Value/emergent results were called by telephone at the time of interpretation on 03/14/2019 at 2:18 pm to Dr. Charlesetta Shanks , who verbally acknowledged these results. 5 mm nodule seen in left upper lobe which was noted on prior exam. Surrounding ground-glass opacity is now noted. Initial follow-up by chest CT without contrast is recommended in 3 months to confirm persistence. This recommendation follows the consensus statement: Recommendations for the Management of Subsolid Pulmonary Nodules Detected at CT: A Statement from the [place] as published in Radiology 2013; 266:304-317. These recommendations are taken from: Recommendations for the Management of Subsolid Pulmonary Nodules Detected at CT: A Statement from the [place] [facility] 2013; 266:1, [phone]. Findings consistent with polycystic kidney disease. Aortic Atherosclerosis (ICD10-I70.0). Electronically Signed    By: Marijo Conception M.D.   On: 03/14/2019 14:22   Dg Chest Port 1 View  Result Date: 03/14/2019 CLINICAL DATA:  Near syncope EXAM: PORTABLE CHEST 1 VIEW COMPARISON:  August 16, 2018 chest radiograph and October 25, 2018 chest CT FINDINGS: There is mild elevation of the left hemidiaphragm with mild scarring in the left base. There is no edema or consolidation. Heart is upper normal in size with pulmonary vascularity normal. There is aortic atherosclerosis. No adenopathy. There old healed rib fractures on the left. There is postoperative change in the lower cervical region. IMPRESSION: Mild apparent scarring left base with mild elevation of the left hemidiaphragm. No edema or consolidation. Heart upper normal in size. No adenopathy evident. Aortic Atherosclerosis (ICD10-I70.0). Electronically Signed   By: Lowella Grip III M.D.   On: 03/14/2019 09:51   Vas [place] Lower Extremity Venous (dvt) (mc And Wl 7a-7p)  Result Date: 03/14/2019  Lower Venous Study Indications: Swelling, and Edema.  Comparison Study: no prior Performing Technologist: Abram Sander RVS  Examination Guidelines: A complete evaluation includes B-mode imaging, spectral Doppler, color Doppler, and power Doppler as needed of all accessible portions of each vessel. Bilateral testing is considered an integral part of a complete examination. Limited examinations for reoccurring indications may be performed as noted.  +-----+---------------+---------+-----------+----------+--------------+ RIGHTCompressibilityPhasicitySpontaneityPropertiesThrombus Aging +-----+---------------+---------+-----------+----------+--------------+ CFV  Full           Yes      Yes                                 +-----+---------------+---------+-----------+----------+--------------+   +---------+---------------+---------+-----------+----------+--------------+ LEFT     CompressibilityPhasicitySpontaneityPropertiesThrombus Aging  +---------+---------------+---------+-----------+----------+--------------+ CFV      Full           Yes      Yes                                 +---------+---------------+---------+-----------+----------+--------------+ SFJ      Full                                                        +---------+---------------+---------+-----------+----------+--------------+ FV Prox  Full                                                        +---------+---------------+---------+-----------+----------+--------------+ FV Mid   Partial                                      Acute          +---------+---------------+---------+-----------+----------+--------------+ FV DistalNone                                         Acute          +---------+---------------+---------+-----------+----------+--------------+ PFV      Full                                                        +---------+---------------+---------+-----------+----------+--------------+ POP      None           No       No                   Acute          +---------+---------------+---------+-----------+----------+--------------+ PTV      Full                                                        +---------+---------------+---------+-----------+----------+--------------+ PERO                                                  Not visualized +---------+---------------+---------+-----------+----------+--------------+ Gastroc  None  Acute          +---------+---------------+---------+-----------+----------+--------------+     Summary: Right: No evidence of common femoral vein obstruction. Left: Findings consistent with acute deep vein thrombosis involving the left femoral vein, left popliteal vein, left posterior tibial veins, and left gastrocnemius veins. No cystic structure found in the popliteal fossa.  *See table(s) above for measurements and observations.  Electronically signed by Curt Jews MD on 03/14/2019 at 12:49:23 PM.    Final     Pending Labs Unresulted Labs (From admission, onward)    Start     Ordered   03/15/19 0500  Heparin level (unfractionated)  Daily,   R     03/14/19 1220   03/15/19 0500  CBC  Daily,   R     03/14/19 1220   03/15/19 0500  CBC  Tomorrow morning,   R     03/14/19 1449   03/15/19 2263  Basic metabolic panel  Tomorrow morning,   R     03/14/19 1449   03/15/19 0500  Magnesium  Tomorrow morning,   R     03/14/19 1449   03/15/19 0500  Phosphorus  Tomorrow morning,   R     03/14/19 1449   03/14/19 1900  Heparin level (unfractionated)  Once-Timed,   STAT     03/14/19 1220          Vitals/Pain Today's Vitals   03/14/19 1430 03/14/19 1445 03/14/19 1500 03/14/19 1515  BP: 132/84 118/83 135/76 128/83  Pulse: 63 60 77 61  Resp: (!) 24 12 20  (!) 25  Temp:      TempSrc:      SpO2: 98% 98% 93% 96%  Weight:      PainSc:        Isolation Precautions No active isolations  Medications Medications  heparin ADULT infusion 100 units/mL (25000 units/250mL sodium chloride 0.45%) (1,650 Units/hr Intravenous New Bag/Given 03/14/19 1431)  lactated ringers infusion (has no administration in time range)  albuterol (PROVENTIL) (2.5 MG/3ML) 0.083% nebulizer solution 2.5 mg (has no administration in time range)  atorvastatin (LIPITOR) tablet 10 mg (has no administration in time range)  beclomethasone (QVAR) 80 MCG/ACT inhaler 1 puff (has no administration in time range)  perflutren lipid microspheres (DEFINITY) IV suspension (2 mLs Intravenous Given 03/14/19 1534)  sodium chloride 0.9 % bolus 500 mL (0 mLs Intravenous Stopped 03/14/19 1232)  heparin bolus via infusion 5,500 Units (5,500 Units Intravenous Bolus from Bag 03/14/19 1432)  iohexol (OMNIPAQUE) 350 MG/ML injection 100 mL (42 mLs Intravenous Contrast Given 03/14/19 1406)    Mobility walks Moderate fall risk   Focused Assessments Cardiac Assessment  Handoff:  Cardiac Rhythm: Normal sinus rhythm No results found for: CKTOTAL, CKMB, CKMBINDEX, TROPONINI Lab Results  Component Value Date   DDIMER 15.75 (H) 03/14/2019   Does the Patient currently have chest pain? No     R Recommendations: See Admitting Provider Note  Report given to:   Additional Notes:

## 2019-03-14 NOTE — Progress Notes (Signed)
Lower extremity venous has been completed.   Preliminary results in CV Proc.   Abram Sander 03/14/2019 12:01 PM

## 2019-03-14 NOTE — Progress Notes (Signed)
ANTICOAGULATION CONSULT NOTE - Follow Up Consult  Pharmacy Consult for Heparin Indication: bilateral pulmonary embolus and DVT  Allergies  Allergen Reactions  . Aldactone [Spironolactone]     Breast tissue enlargement  . Codeine Other (See Comments)    Headache   . Lisinopril Cough  . Nexium [Esomeprazole Magnesium] Diarrhea  . Sulfa Antibiotics Rash  . Sulfa Drugs Cross Reactors Rash    Patient Measurements: Weight: 235 lb 3.7 oz (106.7 kg) Heparin Dosing Weight: 104 kg  Vital Signs: Temp: 98.6 F (37 C) (08/19 1947) Temp Source: Oral (08/19 1947) BP: 151/87 (08/19 1947) Pulse Rate: 55 (08/19 1947)  Labs: Recent Labs    03/14/19 0934 03/14/19 1202 03/14/19 1536 03/14/19 1823  HGB 14.1  --   --   --   HCT 44.4  --   --   --   PLT 182  --   --   --   LABPROT 13.9  --   --   --   INR 1.1  --   --   --   HEPARINUNFRC  --   --   --  0.31  CREATININE 1.59*  --   --   --   TROPONINIHS 21* 61* 84*  --     CrCl cannot be calculated (Unknown ideal weight.).   Medications:  Scheduled:  . atorvastatin  10 mg Oral q1800  . [START ON 03/15/2019] fluticasone furoate-vilanterol  1 puff Inhalation Daily   And  . [START ON 03/15/2019] umeclidinium bromide  1 puff Inhalation Daily   Infusions:  . heparin 1,650 Units/hr (03/14/19 1431)  . lactated ringers 125 mL/hr at 03/14/19 1758    Assessment: 63 yo M presented to ED with near syncopal episode.  Pt found to have DVT and bilateral PE and was started on heparin infusion.  Initial heparin level therapeutic at low end of goal range.  Will increase rate to target mid range of goal or higher given multiple clots.  No bleeding noted.  Goal of Therapy:  Heparin level 0.3-0.7 units/ml Monitor platelets by anticoagulation protocol: Yes   Plan:  Increase heparin to 1750 units/hr. Next heparin level at 0300. Daily heparin level and CBC.  [employer], Pharm.D., BCPS Clinical Pharmacist  **Pharmacist phone directory  can now be found on [email or web address] ([email or web address]).  Listed under [place].  03/14/2019 8:44 PM

## 2019-03-14 NOTE — Progress Notes (Signed)
ANTICOAGULATION CONSULT NOTE - Initial Consult  Pharmacy Consult for heparin Indication: pulmonary embolus  Allergies  Allergen Reactions  . Aldactone [Spironolactone]     Breast tissue enlargement  . Codeine Other (See Comments)    Headache   . Lisinopril Cough  . Nexium [Esomeprazole Magnesium] Diarrhea  . Sulfa Antibiotics Rash  . Sulfa Drugs Cross Reactors Rash    Patient Measurements:   Heparin Dosing Weight: 104kg  Vital Signs: Temp: 98.1 F (36.7 C) (08/19 1026) Temp Source: Oral (08/19 1026) BP: 128/86 (08/19 1100) Pulse Rate: 64 (08/19 1100)  Labs: Recent Labs    03/14/19 0934  HGB 14.1  HCT 44.4  PLT 182  LABPROT 13.9  INR 1.1  CREATININE 1.59*  TROPONINIHS 21*    CrCl cannot be calculated (Unknown ideal weight.).   Medical History: Past Medical History:  Diagnosis Date  . 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HCC)    ongoing evaluation for elevated R>L adosterone secretion  . Arthritis   . Atrial enlargement, bilateral   . Atrial fibrillation ([place])   . Atrial fibrillation (HCC)    paroxysmal  . Atrial flutter ([place])   . Complication of anesthesia    could not urinate after UHR, needed cath, mental fog sinc cervical fusion 08-09-16   . Dysrhythmia    a-fib-had ablation x2  . GERD (gastroesophageal reflux disease)   . Hyperaldosteronism ([place])   . Hypertension   . Kidney cysts    "multiple"  . Nephrolithiasis   . Pneumonia 1990's  . Pre-diabetes   . Prostatitis   . Renal insufficiency    "Cr 1.6"   Assessment: 41 YOM presenting with near syncopal episode, pharmacy consulted to start heparin for PE, has been on Xarelto in past but no longer on any anticoagulation PTA, CBC wnl.  Goal of Therapy:  Heparin level 0.3-0.7 units/ml Monitor platelets by anticoagulation protocol: Yes   Plan:  Heparin 5500 units IV x1, and gtt at 1650 units/hr F/u 6 hour heparin level  Bertis Ruddy, PharmD Clinical Pharmacist Please check AMION for all [place] numbers 03/14/2019 12:12 PM

## 2019-03-14 NOTE — Progress Notes (Signed)
  Echocardiogram 2D Echocardiogram has been performed.  Casey [name] 03/14/2019, 3:53 PM

## 2019-03-14 NOTE — ED Provider Notes (Addendum)
[facility] EMERGENCY DEPARTMENT Provider Note   CSN: [id number] Arrival date & time: 03/14/19  0920    History   Chief Complaint Chief Complaint  Patient presents with   Near Syncope    HPI Casey [name] [facility], Dr. is a 63 y.o. male.     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  He reports he did not have complete loss of consciousness.  He reports that he did feel a pressure-like sensation, across his chest.  He reports that once he was in a seated position the episode passed.  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  He reports that he has been having a pain behind his knee and up towards his groin.  He had a prior episode of thrombophlebitis at one point time and had been on Xarelto which was discontinued about 6 months ago.  He reports that the pain started actually few weeks or more ago and he suspected it was a cramp in the calf but has been noting over the past week pain that has progressed behind the knee and up toward the groin.  Patient has history of some paroxysmal atrial fibrillation but has not had an episode in 5 years.  He did not perceive heart racing or palpitations with this event.  No confusion or headache. 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[place])    ongoing evaluation for elevated R>L adosterone secretion   Arthritis    Atrial enlargement, bilateral    Atrial fibrillation (HCC)    Atrial fibrillation (HCC)    paroxysmal   Atrial flutter (HCC)    Complication of anesthesia    could not urinate after UHR, needed cath, mental fog sinc cervical fusion 08-09-16    Dysrhythmia    a-fib-had ablation x2   GERD (gastroesophageal reflux disease)    Hyperaldosteronism ([place])    Hypertension     Kidney cysts    "multiple"   Nephrolithiasis    Pneumonia 1990's   Pre-diabetes    Prostatitis    Renal insufficiency    "Cr 1.6"    Patient Active Problem List   Diagnosis Date Noted   Acute respiratory failure with hypoxia ([place]) 08/18/2018   Hyperlipidemia 08/17/2018   Acute respiratory failure ([place]) 08/17/2018   Community acquired pneumonia of right lower lobe of lung ([place]) 08/16/2018   CKD (chronic kidney disease), stage II 08/16/2018   Abnormal ECG 07/25/2018   Renal insufficiency    Prostatitis    Pre-diabetes    Pneumonia    Nephrolithiasis    Kidney cysts    Hyperaldosteronism (HCC)    GERD (gastroesophageal reflux disease)    Dysrhythmia    Complication of anesthesia    Atrial enlargement, bilateral    Arthritis    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[place])    Inguinal hernia of left side without obstruction or gangrene Nov 2017 06/23/2016   Adjustment disorder with depressed mood 05/11/2016   Atrial fibrillation ([place]) 03/18/2011   Atrial flutter ([place]) 03/18/2011   Hypertension 03/18/2011    Past Surgical History:  Procedure Laterality Date   ABLATION     ATRIAL FIBRILLATION ABLATION N/A 09/28/2012   Procedure: ATRIAL FIBRILLATION ABLATION;  Surgeon: Thompson Grayer, MD;  Location: [facility] CATH  LAB;  Service: Cardiovascular;  Laterality: N/A;   atrial fibrillation ablation with CTI ablation  05/27/11, 09/28/12   ablation for afib and atrial flutter by Dr Norman Herrlich SURGERY  08/09/2016   C 5 6  C 6 to C 7 cervical fusion   CARDIOVERSION  07/28/2011   Procedure: CARDIOVERSION;  Surgeon: Lelon Perla, MD;  Location: [place];  Service: Cardiovascular;  Laterality: N/A;   COLONOSCOPY WITH PROPOFOL N/A 03/03/2015   Procedure: COLONOSCOPY WITH PROPOFOL;  Surgeon: Garlan Fair, MD;  Location: [facility] END[facility]COPY;  Service: Gastroenterology;  Laterality: N/A;   double j stent placement  06/2004   left   ESOPHAGOGASTRODUODENOSCOPY (EGD) WITH  PROPOFOL N/A 03/03/2015   Procedure: ESOPHAGOGASTRODUODENOSCOPY (EGD) WITH PROPOFOL;  Surgeon: Garlan Fair, MD;  Location: [facility] ENDOSCOPY;  Service: Gastroenterology;  Laterality: N/A;   ESOPHAGOGASTRODUODENOSCOPY (EGD) WITH PROPOFOL N/A 10/25/2016   Procedure: ESOPHAGOGASTRODUODENOSCOPY (EGD) WITH PROPOFOL;  Surgeon: Garlan Fair, MD;  Location: [facility] ENDOSCOPY;  Service: Endoscopy;  Laterality: N/A;   INGUINAL HERNIA REPAIR     "as a child; ? right"   INGUINAL HERNIA REPAIR Left 06/23/2016   Procedure: OPEN LEFT INGUINAL HERNIA  REPAIR WITH MESH;  Surgeon: Johnathan Hausen, MD;  Location: [place];  Service: General;  Laterality: Left;   INSERTION OF MESH Left 06/23/2016   Procedure: INSERTION OF MESH;  Surgeon: Johnathan Hausen, MD;  Location: [place];  Service: General;  Laterality: Left;   LEFT HEART CATH AND CORONARY ANGIOGRAPHY N/A 07/25/2018   Procedure: LEFT HEART CATH AND CORONARY ANGIOGRAPHY;  Surgeon: Belva Crome, MD;  Location: [place] CV LAB;  Service: Cardiovascular;  Laterality: N/A;   LUNG BIOPSY  1993   NASAL SEPTOPLASTY W/ TURBINOPLASTY  07/2006   prostate biopsy  2011   schwannoma removal  ~01/2010   left thorax   TEE WITHOUT CARDIOVERSION N/A 09/27/2012   Procedure: TRANSESOPHAGEAL ECHOCARDIOGRAM (TEE);  Surgeon: Larey Dresser, MD;  Location: [place];  Service: Cardiovascular;  Laterality: N/A;   ULTRASOUND GUIDANCE FOR VASCULAR ACCESS  07/25/2018   Procedure: Ultrasound Guidance For Vascular Access;  Surgeon: Belva Crome, MD;  Location: [place] CV LAB;  Service: Cardiovascular;;   UMBILICAL HERNIA REPAIR  07/2006        Home Medications    Prior to Admission medications   Medication Sig Start Date End Date Taking? Authorizing Provider  albuterol (PROVENTIL HFA;VENTOLIN HFA) 108 (90 Base) MCG/ACT inhaler Inhale 2 puffs into the lungs every 6 (six) hours as needed for wheezing or shortness of breath. 08/19/18    Eulogio Bear U, DO  albuterol (PROVENTIL) (2.5 MG/3ML) 0.083% nebulizer solution Take 3 mLs (2.5 mg total) by nebulization every 4 (four) hours as needed for wheezing or shortness of breath. 08/31/18   Garner Nash, DO  allopurinol (ZYLOPRIM) 100 MG tablet Take 100 mg by mouth 2 (two) times daily.     [provider]  atorvastatin (LIPITOR) 10 MG tablet Take 10 mg by mouth every evening.     [provider]  beclomethasone (QVAR) 80 MCG/ACT inhaler Inhale 1 puff into the lungs 2 (two) times daily.    [provider]  CIALIS 20 MG tablet Take 20 mg by mouth daily as needed for erectile dysfunction.  09/23/16   [provider]  diltiazem (CARDIZEM CD) 360 MG 24 hr capsule Take 360 mg by mouth every evening. 10/04/16   [provider]  eplerenone ([place])  50 MG tablet Take 50 mg by mouth 2 (two) times daily.    [provider]  FIBER ADULT GUMMIES PO Take 2 tablets by mouth 2 (two) times daily.     [provider]  Fluticasone-Umeclidin-Vilant (TRELEGY ELLIPTA) 100-62.5-25 MCG/INH AEPB Inhale 1 puff into the lungs daily. 11/28/18   [name], Octavio Graves, DO  ibuprofen (ADVIL,MOTRIN) 200 MG tablet Take 200-400 mg by mouth daily as needed for headache or moderate pain.     [provider]  loperamide (IMODIUM A-D) 2 MG tablet Take 2 mg by mouth 2 (two) times daily.    [provider]  losartan (COZAAR) 100 MG tablet Take 100 mg by mouth every evening.    [provider]  omeprazole (PRILOSEC) 40 MG capsule Take 40 mg by mouth daily before supper.     [provider]  oxybutynin (DITROPAN-XL) 10 MG 24 hr tablet Take 10 mg by mouth daily. 01/28/18   [provider]  Potassium 99 MG TABS Take 198 mg by mouth 2 (two) times daily.    [provider]  rivaroxaban (XARELTO) 20 MG TABS tablet TAKE 1 TABLET(20 MG) BY MOUTH DAILY WITH SUPPER 08/19/18   Eulogio Bear U, DO  sertraline (ZOLOFT) 50 MG tablet  Take 1 tablet (50 mg total) by mouth every evening. 12/07/17   Eksir, Richard Miu, MD  vitamin B-12 (CYANOCOBALAMIN) 1000 MCG tablet Take 1,000 mcg by mouth daily.    [provider]    Family History Family History  Problem Relation Age of Onset   Atrial fibrillation Other        sister has had 2 afib ablations   Atrial fibrillation Sister     Social History Social History   Tobacco Use   Smoking status: Never Smoker   Smokeless tobacco: Never Used  Substance Use Topics   Alcohol use: Yes    Alcohol/week: 2.0 standard drinks    Types: 2 Shots of liquor per week    Comment: occasional, 05-11-2016 Per pt 3 drinks per wk   Drug use: No    Comment: 05-11-2016 per pt no     Allergies   Aldactone [spironolactone], Codeine, Lisinopril, Nexium [esomeprazole magnesium], Sulfa antibiotics, and Sulfa drugs cross reactors   Review of Systems Review of Systems 10 Systems reviewed and are negative for acute change except as noted in the HPI.   Physical Exam Updated Vital Signs BP 134/83    Pulse 92    Temp 98.1 F (36.7 C) (Oral)    Resp 16    Wt 104.3 kg    SpO2 98%    BMI 29.53 kg/m   Physical Exam Constitutional:      Appearance: He is well-developed.  HENT:     Head: Normocephalic and atraumatic.  Eyes:     Extraocular Movements: Extraocular movements intact.  Neck:     Musculoskeletal: Neck supple.  Cardiovascular:     Rate and Rhythm: Normal rate and regular rhythm.     Heart sounds: Normal heart sounds.  Pulmonary:     Effort: Pulmonary effort is normal.     Comments: No respiratory distress.  Speaking in full sentences without difficulty.  There is some decreased airflow around the lung left base is.  Otherwise clear with good airflow. Abdominal:     General: Bowel sounds are normal. There is no distension.     Palpations: Abdomen is soft.     Tenderness: There is no abdominal tenderness.  Musculoskeletal: Normal  range of motion.         General: Swelling and tenderness present.     Comments: There is trace edema behind the ankle on the left.  Not large swelling of the left lower extremity but subtle increased compared to the right.  Tenderness behind the popliteal fossa.  Patient has hyperpigmentation skin thinning consistent with chronic venous stasis.  Dorsalis pedis pulses are 2+ and strong.  Feet are warm and dry.  Skin:    General: Skin is warm and dry.  Neurological:     Mental Status: He is alert and oriented to person, place, and time.     GCS: GCS eye subscore is 4. GCS verbal subscore is 5. GCS motor subscore is 6.     Coordination: Coordination normal.  Psychiatric:        Mood and Affect: Mood normal.      ED Treatments / Results  Labs (all labs ordered are listed, but only abnormal results are displayed) Labs Reviewed  COMPREHENSIVE METABOLIC PANEL - Abnormal; Notable for the following components:      Result Value   Glucose, Bld 136 (*)    BUN 31 (*)    Creatinine, Ser 1.59 (*)    GFR calc non Af Amer 45 (*)    GFR calc Af Amer 52 (*)    All other components within normal limits  CBC WITH DIFFERENTIAL/PLATELET - Abnormal; Notable for the following components:   WBC 10.9 (*)    RDW 15.9 (*)    Neutro Abs 8.7 (*)    Abs Immature Granulocytes 0.09 (*)    All other components within normal limits  D-DIMER, QUANTITATIVE (NOT AT [facility]) - Abnormal; Notable for the following components:   D-Dimer, Quant 15.75 (*)    All other components within normal limits  CBG MONITORING, ED - Abnormal; Notable for the following components:   Glucose-Capillary 144 (*)    All other components within normal limits  TROPONIN I (HIGH SENSITIVITY) - Abnormal; Notable for the following components:   Troponin I (High Sensitivity) 21 (*)    All other components within normal limits  TROPONIN I (HIGH SENSITIVITY) - Abnormal; Notable for the following components:   Troponin I (High Sensitivity) 61 (*)    All other components  within normal limits  SARS CORONAVIRUS 2 (HOSPITAL ORDER, [place] [facility])  PROTIME-INR  MAGNESIUM  PHOSPHORUS  HEPARIN LEVEL (UNFRACTIONATED)    EKG EKG Interpretation  Date/Time:  Wednesday March 14 2019 09:33:11 EDT Ventricular Rate:  72 PR Interval:    QRS Duration: 123 QT Interval:  412 QTC Calculation: 451 R Axis:   -62 Text Interpretation:  Sinus rhythm Prolonged PR interval Nonspecific IVCD with LAD Inferior infarct, old Anterior infarct, old no sig change from previous. no acute ischemic changes Confirmed by Charlesetta Shanks [phone]) on 03/14/2019 11:00:44 AM   Radiology Ct Angio Chest Pe W/cm &/or Wo Cm  Result Date: 03/14/2019 CLINICAL DATA:  Chest pain. EXAM: CT ANGIOGRAPHY CHEST WITH CONTRAST TECHNIQUE: Multidetector CT imaging of the chest was performed using the standard protocol during bolus administration of intravenous contrast. Multiplanar CT image reconstructions and MIPs were obtained to evaluate the vascular anatomy. CONTRAST:  5mL OMNIPAQUE IOHEXOL 350 MG/ML SOLN COMPARISON:  CT scan of October 25, 2018. FINDINGS: Cardiovascular: Large linear saddle embolus is seen extending into both pulmonary arteries and their lower lobe branches. RV/LV ratio of 1.2 is noted suggesting right heart strain. Normal cardiac size. No pericardial effusion.  Atherosclerosis of thoracic aorta is noted without aneurysm formation. Mediastinum/Nodes: No enlarged mediastinal, hilar, or axillary lymph nodes. Thyroid gland, trachea, and esophagus demonstrate no significant findings. Lungs/Pleura: 5 mm nodule is noted in left upper lobe best seen on image number 34 series 7; there is interval development surrounding ground-glass opacity. This is not significantly changed compared to prior exam. No pleural effusion or pneumothorax. Upper Abdomen: Multiple large cysts are seen involving both kidneys consistent with polycystic kidney disease. Musculoskeletal: No chest wall  abnormality. No acute or significant osseous findings. Review of the MIP images confirms the above findings. IMPRESSION: Large acute pulmonary embolus is noted extending into both pulmonary arteries and their lower lobe branches. Positive for acute PE with CT evidence of right heart strain (RV/LV Ratio = 1.2) consistent with at least submassive (intermediate risk) PE. The presence of right heart strain has been associated with an increased risk of morbidity and mortality. Please activate Code PE by paging [phone]. Critical Value/emergent results were called by telephone at the time of interpretation on 03/14/2019 at 2:18 pm to Dr. Charlesetta Shanks , who verbally acknowledged these results. 5 mm nodule seen in left upper lobe which was noted on prior exam. Surrounding ground-glass opacity is now noted. Initial follow-up by chest CT without contrast is recommended in 3 months to confirm persistence. This recommendation follows the consensus statement: Recommendations for the Management of Subsolid Pulmonary Nodules Detected at CT: A Statement from the [facility] as published in Radiology 2013; 266:304-317. These recommendations are taken from: Recommendations for the Management of Subsolid Pulmonary Nodules Detected at CT: A Statement from the [place] [facility] 2013; 266:1, [phone]. Findings consistent with polycystic kidney disease. Aortic Atherosclerosis (ICD10-I70.0). Electronically Signed   By: Marijo Conception M.D.   On: 03/14/2019 14:22   Dg Chest Port 1 View  Result Date: 03/14/2019 CLINICAL DATA:  Near syncope EXAM: PORTABLE CHEST 1 VIEW COMPARISON:  August 16, 2018 chest radiograph and October 25, 2018 chest CT FINDINGS: There is mild elevation of the left hemidiaphragm with mild scarring in the left base. There is no edema or consolidation. Heart is upper normal in size with pulmonary vascularity normal. There is aortic atherosclerosis. No adenopathy. There old healed rib fractures on  the left. There is postoperative change in the lower cervical region. IMPRESSION: Mild apparent scarring left base with mild elevation of the left hemidiaphragm. No edema or consolidation. Heart upper normal in size. No adenopathy evident. Aortic Atherosclerosis (ICD10-I70.0). Electronically Signed   By: Lowella Grip III M.D.   On: 03/14/2019 09:51   Vas [place] Lower Extremity Venous (dvt) (mc And Wl 7a-7p)  Result Date: 03/14/2019  Lower Venous Study Indications: Swelling, and Edema.  Comparison Study: no prior Performing Technologist: Abram Sander RVS  Examination Guidelines: A complete evaluation includes B-mode imaging, spectral Doppler, color Doppler, and power Doppler as needed of all accessible portions of each vessel. Bilateral testing is considered an integral part of a complete examination. Limited examinations for reoccurring indications may be performed as noted.  +-----+---------------+---------+-----------+----------+--------------+  RIGHT Compressibility Phasicity Spontaneity Properties Thrombus Aging  +-----+---------------+---------+-----------+----------+--------------+  CFV   Full            Yes       Yes                                    +-----+---------------+---------+-----------+----------+--------------+   +---------+---------------+---------+-----------+----------+--------------+  LEFT  Compressibility Phasicity Spontaneity Properties Thrombus Aging  +---------+---------------+---------+-----------+----------+--------------+  CFV       Full            Yes       Yes                                    +---------+---------------+---------+-----------+----------+--------------+  SFJ       Full                                                             +---------+---------------+---------+-----------+----------+--------------+  FV Prox   Full                                                             +---------+---------------+---------+-----------+----------+--------------+  FV Mid     Partial                                          Acute           +---------+---------------+---------+-----------+----------+--------------+  FV Distal None                                             Acute           +---------+---------------+---------+-----------+----------+--------------+  PFV       Full                                                             +---------+---------------+---------+-----------+----------+--------------+  POP       None            No        No                     Acute           +---------+---------------+---------+-----------+----------+--------------+  PTV       Full                                                             +---------+---------------+---------+-----------+----------+--------------+  PERO                                                       Not visualized  +---------+---------------+---------+-----------+----------+--------------+  Gastroc   None  Acute           +---------+---------------+---------+-----------+----------+--------------+     Summary: Right: No evidence of common femoral vein obstruction. Left: Findings consistent with acute deep vein thrombosis involving the left femoral vein, left popliteal vein, left posterior tibial veins, and left gastrocnemius veins. No cystic structure found in the popliteal fossa.  *See table(s) above for measurements and observations. Electronically signed by Curt Jews MD on 03/14/2019 at 12:49:23 PM.    Final     Procedures Procedures (including critical care time) CRITICAL CARE Performed by: Charlesetta Shanks   Total critical care time: 30  minutes  Critical care time was exclusive of separately billable procedures and treating other patients.  Critical care was necessary to treat or prevent imminent or life-threatening deterioration.  Critical care was time spent personally by me on the following activities: development of treatment plan with patient and/or  surrogate as well as nursing, discussions with consultants, evaluation of patient's response to treatment, examination of patient, obtaining history from patient or surrogate, ordering and performing treatments and interventions, ordering and review of laboratory studies, ordering and review of radiographic studies, pulse oximetry and re-evaluation of patient's condition. Medications Ordered in ED Medications  sodium chloride 0.9 % bolus 500 mL (0 mLs Intravenous Stopped 03/14/19 1232)    Followed by  0.9 %  sodium chloride infusion (1,000 mLs Intravenous New Bag/Given 03/14/19 1202)  heparin bolus via infusion 5,500 Units (has no administration in time range)  heparin ADULT infusion 100 units/mL (25000 units/28mL sodium chloride 0.45%) (has no administration in time range)  iohexol (OMNIPAQUE) 350 MG/ML injection 100 mL (42 mLs Intravenous Contrast Given 03/14/19 1406)     Initial Impression / Assessment and Plan / ED Course  I have reviewed the triage vital signs and the nursing notes.  Pertinent labs & imaging results that were available during my care of the patient were reviewed by me and considered in my medical decision making (see chart for details).  Clinical Course as of Mar 13 1424  Wed Mar 14, 2019  1202 Consult: Reviewed with Dr. Collene Mares cell of radiology.  We did review GFR and patient is a suitable candidate for reduced dose CT angiogram particularly in light of risk and benefit profile.  Patient will be initiated on heparin.  Consults placed to vascular surgery regarding DVT and suspected proximal clot.  Consult placed for intensivist regarding potential high risk PE with a near syncopal episode.  Patient is however stable with normal blood pressures and oxygen saturation 92% on room air.   [MP]  1226 Consult: Reviewed with Dr. Donnetta Hutching.  He will evaluate the ultrasound and consult for the patient.   [MP]  1240 Consult: Reviewed with Dr. Eartha Inch of critical care.  Advises to call back and  will review CTA results.   [MP]    Clinical Course User Index [MP] Charlesetta Shanks, MD      Patient presents with a near syncope episode.  Pulses and cognition maintained throughout the episode.  Patient however also had report of symptoms suggestive of DVT.  D-dimer very elevated and ultrasound positive.  Patient started empirically on heparin.  Based on history and elevated troponin concern for possible large central PE.  Vascular and intensivist have been consulted.  At this time, vital signs are stable.  Patient's oxygenation is at 92% on room air and 98% on 2 L.  No hypotension.  We will continue to monitor.  Still awaiting results of PE study.  Highly anticipate  admission.   PE study has returned positive for saddle pulmonary embolus.  Patient's general condition remains good.  He is alert and appropriate without respiratory distress.  Blood pressure stable.  Critical care is assessing the patient for admission.  Final Clinical Impressions(s) / ED Diagnoses   Final diagnoses:  Acute saddle pulmonary embolism with acute cor pulmonale [facility])    ED Discharge Orders    None       Charlesetta Shanks, MD 03/14/19 1357    Charlesetta Shanks, MD 03/14/19 1427

## 2019-03-14 NOTE — Consult Note (Signed)
Vascular and Vein Specialist of [facility]  Patient name: Casey [name], Dr. 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  Presented after an episode of lightheadedness.  Had noted some swelling in his left leg over the past several days.  Evaluation in the emergency department revealed left leg DVT and CT angiogram of his chest revealed submassive pulmonary embolus with saddle embolus and extension into the lower lobes bilaterally.  Initially was found to have oxygen saturations of 92% on room air.  Has no prior history of DVT.  Did have a history in the past of saphenous vein superficial thrombophlebitis.  He had been on anticoagulation in the past related to cardiac arrhythmia.  Reports that he has not been on any anticoagulation for several months.  [place]  Past Medical History:  Diagnosis Date  . 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HCC)    ongoing evaluation for elevated R>L adosterone secretion  . Arthritis   . Atrial enlargement, bilateral   . Atrial fibrillation ([place])   . Atrial fibrillation (HCC)    paroxysmal  . Atrial flutter ([place])   . Complication of anesthesia    could not urinate after UHR, needed cath, mental fog sinc cervical fusion 08-09-16   . Dysrhythmia    a-fib-had ablation x2  . GERD (gastroesophageal reflux disease)   . Hyperaldosteronism ([place])   . Hypertension   . Kidney cysts    "multiple"  . Nephrolithiasis   . Pneumonia 1990's  . Pre-diabetes   . Prostatitis   . Renal insufficiency    "Cr 1.6"    Family History  Problem Relation Age of Onset  . Atrial fibrillation Other        sister has had 2 afib ablations  . Atrial fibrillation Sister     SOCIAL HISTORY: Social History   Tobacco Use  . Smoking status: Never Smoker  . Smokeless tobacco: Never Used  Substance Use Topics  .  Alcohol use: Yes    Alcohol/week: 2.0 standard drinks    Types: 2 Shots of liquor per week    Comment: occasional, 05-11-2016 Per pt 3 drinks per wk    Allergies  Allergen Reactions  . Aldactone [Spironolactone]     Breast tissue enlargement  . Codeine Other (See Comments)    Headache   . Lisinopril Cough  . Nexium [Esomeprazole Magnesium] Diarrhea  . Sulfa Antibiotics Rash  . Sulfa Drugs Cross Reactors Rash    Current Facility-Administered Medications  Medication Dose Route Frequency Provider Last Rate Last Dose  . albuterol (PROVENTIL) (2.5 MG/3ML) 0.083% nebulizer solution 2.5 mg  2.5 mg Nebulization Q3H PRN Desai, Rahul P, PA-C      . atorvastatin (LIPITOR) tablet 10 mg  10 mg Oral q1800 Desai, Rahul P, PA-C      . beclomethasone (QVAR) 80 MCG/ACT inhaler 1 puff  1 puff Inhalation BID Desai, Rahul P, PA-C      . heparin ADULT infusion 100 units/mL (25000 units/246mL sodium chloride 0.45%)  1,650 Units/hr Intravenous Continuous Bertis Ruddy, RPH 16.5 mL/hr at 03/14/19 1431 1,650 Units/hr at 03/14/19 1431  . lactated ringers infusion   Intravenous Continuous Desai, Rahul P, PA-C      . perflutren lipid microspheres (DEFINITY) IV suspension  1-10 mL Intravenous PRN Shearon Stalls, Rahul P, PA-C   2 mL at 03/14/19 1534  Current Outpatient Medications  Medication Sig Dispense Refill  . albuterol (PROVENTIL HFA;VENTOLIN HFA) 108 (90 Base) MCG/ACT inhaler Inhale 2 puffs into the lungs every 6 (six) hours as needed for wheezing or shortness of breath. 1 Inhaler 2  . albuterol (PROVENTIL) (2.5 MG/3ML) 0.083% nebulizer solution Take 3 mLs (2.5 mg total) by nebulization every 4 (four) hours as needed for wheezing or shortness of breath. 75 mL 1  . allopurinol (ZYLOPRIM) 100 MG tablet Take 100 mg by mouth 2 (two) times daily.     . Ascorbic Acid (VITAMIN C) 1000 MG tablet Take 1,000 mg by mouth daily.    Marland Kitchen atorvastatin (LIPITOR) 10 MG tablet Take 10 mg by mouth every evening.     . diltiazem  (CARDIZEM CD) 360 MG 24 hr capsule Take 360 mg by mouth every evening.  7  . eplerenone (INSPRA) 50 MG tablet Take 50 mg by mouth 2 (two) times daily.    . Fluticasone-Umeclidin-Vilant (TRELEGY ELLIPTA) 100-62.5-25 MCG/INH AEPB Inhale 1 puff into the lungs daily. 1 each 3  . ibuprofen (ADVIL,MOTRIN) 200 MG tablet Take 200-400 mg by mouth daily as needed for headache or moderate pain.     Marland Kitchen loperamide (IMODIUM A-D) 2 MG tablet Take 2 mg by mouth 2 (two) times daily.    Marland Kitchen losartan (COZAAR) 100 MG tablet Take 100 mg by mouth every evening.    . Multiple Vitamins-Minerals (ZINC PO) Take 1 tablet by mouth 2 (two) times daily.    Marland Kitchen omeprazole (PRILOSEC) 40 MG capsule Take 40 mg by mouth daily before supper.     Marland Kitchen oxybutynin (DITROPAN-XL) 10 MG 24 hr tablet Take 10 mg by mouth daily.    . Potassium 99 MG TABS Take 198 mg by mouth 2 (two) times daily.    . sertraline (ZOLOFT) 50 MG tablet Take 1 tablet (50 mg total) by mouth every evening. (Patient taking differently: Take 100 mg by mouth every evening. ) 90 tablet 1  . vitamin B-12 (CYANOCOBALAMIN) 1000 MCG tablet Take 1,000 mcg by mouth daily.    Marland Kitchen CIALIS 20 MG tablet Take 20 mg by mouth daily as needed for erectile dysfunction.   11    REVIEW OF SYSTEMS:  Reviewed in his chart  PHYSICAL EXAM: Vitals:   03/14/19 1500 03/14/19 1515 03/14/19 1600 03/14/19 1630  BP: 135/76 128/83 120/85 130/87  Pulse: 77 61 (!) 58   Resp: 20 (!) 25 14 14   Temp:      TempSrc:      SpO2: 93% 96% 97% 96%  Weight:        GENERAL: The patient is a well-nourished male, in no acute distress. The vital signs are documented above. CARDIOVASCULAR: 2+ dorsalis pedis pulses bilaterally.  Mild swelling in left calf versus right.  Mild tenderness. PULMONARY: There is good air exchange  MUSCULOSKELETAL: There are no major deformities or cyanosis. NEUROLOGIC: No focal weakness or paresthesias are detected. SKIN: There are no ulcers or rashes noted. PSYCHIATRIC: The  patient has a normal affect.  DATA:  Left leg duplex reveals thrombus in the mid femoral vein and popliteal vein extending into the posterior tibial vein.  No evidence of proximal occlusive thrombus  CT scan was reviewed showing large saddle embolus.  MEDICAL ISSUES: Discussed with Dr. [name] and his wife present.  No role for mechanical thrombectomy of his left leg DVT.  No indication for vena cava filter.  Agree with plan for heparin anticoagulation.  Would reserve catheter-based lysis of  pulmonary embolus as long as he is not having any progressive hemodynamic instability or respiratory distress.  We will follow with you    Rosetta Posner, MD [facility] [place] Vascular and Vein Specialists of [facility] Tel [phone] Pager [phone]

## 2019-03-14 NOTE — ED Triage Notes (Signed)
Pt arrives via ems with co near syncope episode. Pt had a cold sweat and heavy chest once pt laid down he felt better. Pt also co of left lower leg tenderness with pain when walking today. Pt has hx of superficial blood clot on right leg. Hx of afib takes Cardizem. Pt denies being on a blood thinner currently.

## 2019-03-14 NOTE — ED Notes (Signed)
Critical Care at bedside.  

## 2019-03-14 NOTE — H&P (Signed)
NAME:  Casey [name], Dr., MRN:  [id number], DOB:  [birth year], LOS: 0 ADMISSION DATE:  03/14/2019, CONSULTATION DATE:  03/14/2019 REFERRING MD:  Laurin Coder CHIEF COMPLAINT:  Syncope, PE  Brief History   63yo Male admitted to PCCM for saddle PE, DVT.   History of present illness   63yo M, who is a Radiation protection practitioner ENT physician, PMH AF/Aflutter s/p ablation, adrenal cortical hyperfunction, HTN, HLD, Hyperaldosteronism, renal insufficiency, RLE superficial DVT previously on Xarelto.  He presented to [facility] ED 8/19 after he had abrupt onset lightheadedness and had to crouch and sink to ground while standing at a workstation (was at [employer] about to perform a tonsillectomy). He did not have any loss of consciousness. This was associated with "funny" sensation across his chest. Symptoms resolved after he was in a seated position.  He states he has had pain behind his left knee that began a few weeks ago which he thought was a cramp then noticed the pain extending to his groin over the past week. He also had dyspnea on exertion.  He was treated for superficial DVT in the past with Xarelto which was discontinued approx 6 months ago.  He has had LLE pitting edema which has worsened over the past week.  Work up in the ED included LE duplex which revealed acute deep vein thrombosis involving the left femoral vein, left popliteal vein, left posterior tibial veins, and left gastrocnemius veins. Subsequent CTA chest revealed large acute pulmonary embolus extending into both pulmonary arteries and their lower lobe branches with evidence of right heart strain (RV/LV Ratio = 1.2).   He was started on heparin and PCCM was called to evaluate.  He remained hemodynamically stable.  He states that he is fairly active and walks up to 3 miles 3 - 4 days a week.  Does have family hx of VTE in his father, though these were felt to be provoked after plane trip and orthopedic hip surgery.  Denies any hx of malignancy.    Past Medical  History  AF/Aflutter s/p ablation, adrenal cortical hyperfunction, HTN, HLD, Hyperaldosteronism, renal insufficiency, RLE superficial DVT previously on Xarelto.    Significant Hospital Events   8/19 > admit.  Consults:  PCCM.  Procedures:  None.  Significant Diagnostic Tests:  8/19 LE duplex > DVT  involving the left femoral vein, left popliteal vein, left posterior tibial veins, and left gastrocnemius veins. 8/19 CTA Chest > large saddle PE, RV / LV = 1.2. Echo 8/19 >   Micro Data:  SARS CoV2 8/19 >   Antimicrobials:  None.   Interim history/subjective:  Comfortable.  No complaints.  Objective   Blood pressure 134/83, pulse 92, temperature 98.1 F (36.7 C), temperature source Oral, resp. rate 16, weight 104.3 kg, SpO2 98 %.        Intake/Output Summary (Last 24 hours) at 03/14/2019 1419 Last data filed at 03/14/2019 1232 Gross per 24 hour  Intake 500 ml  Output -  Net 500 ml   Filed Weights   03/14/19 1200  Weight: 104.3 kg    Examination: General: Adult male, resting in bed, in NAD. Neuro: A&O x 3, no deficits. HEENT: [place]/AT. Sclerae anicteric. EOMI. Cardiovascular: RRR, no M/R/G.  Lungs: Respirations even and unlabored.  CTA bilaterally, No W/R/R. Abdomen: BS x 4, soft, NT/ND.  Musculoskeletal: No gross deformities. LLE pitting edema up to mid tibia. Skin: Intact, warm, no rashes.   Assessment & Plan:   Large saddle PE with LLE DVT -  CT with RV / LV of 1.2.  Echo pending. - Continue heparin gtt. - No need for EKOS at this time and doubt will need as pt is tolerating this suprisingly well.  However, IR notified of admission in case of any decompensation.  - STAT echo. - Would continue heparin at least 48 hours prior to bridging with DOAC. - Likely needs lifelong anticoagulation / DOAC.  Hx HTN, HLD. - Continue preadmission atorvastatin. - Hold preadmission diltiazem, eplerenone, losartan.  Renal insufficiency. - LR @ 125. - Follow BMP.   Best  practice:  Diet: Clear liquids for now. Pain/Anxiety/Delirium protocol (if indicated): N/A. VAP protocol (if indicated): N/A. DVT prophylaxis: Heparin gtt. GI prophylaxis: N/A. Glucose control: N/A. Mobility: Bedrest. Code Status: Full. Family Communication: Wife updated at bedside. Disposition:  SDU.  Labs   CBC: Recent Labs  Lab 03/14/19 0934  WBC 10.9*  NEUTROABS 8.7*  HGB 14.1  HCT 44.4  MCV 89.5  PLT 892    Basic Metabolic Panel: Recent Labs  Lab 03/14/19 0934  NA 140  K 3.7  CL 106  CO2 23  GLUCOSE 136*  BUN 31*  CREATININE 1.59*  CALCIUM 9.4  MG 1.7  PHOS 2.7   GFR: CrCl cannot be calculated (Unknown ideal weight.). Recent Labs  Lab 03/14/19 0934  WBC 10.9*    Liver Function Tests: Recent Labs  Lab 03/14/19 0934  AST 22  ALT 19  ALKPHOS 73  BILITOT 1.0  PROT 7.1  ALBUMIN 3.7   No results for input(s): LIPASE, AMYLASE in the last 168 hours. No results for input(s): AMMONIA in the last 168 hours.  ABG    Component Value Date/Time   PHART 7.420 01/23/2010 0456   PCO2ART 44.9 01/23/2010 0456   PO2ART 78.0 (L) 01/23/2010 0456   HCO3 29.0 (H) 01/23/2010 0456   TCO2 29 08/01/2010 1341   O2SAT 95.0 01/23/2010 0456     Coagulation Profile: Recent Labs  Lab 03/14/19 0934  INR 1.1    Cardiac Enzymes: No results for input(s): CKTOTAL, CKMB, CKMBINDEX, TROPONINI in the last 168 hours.  HbA1C: No results found for: HGBA1C  CBG: Recent Labs  Lab 03/14/19 0928  GLUCAP 144*    Review of Systems:   All negative; except for those that are bolded, which indicate positives.  Constitutional: weight loss, weight gain, night sweats, fevers, chills, fatigue, weakness.  HEENT: headaches, sore throat, sneezing, nasal congestion, post nasal drip, difficulty swallowing, tooth/dental problems, visual complaints, visual changes, ear aches. Neuro: difficulty with speech, weakness, numbness, ataxia. CV:  chest pressure, orthopnea, PND, swelling  in lower extremities, dizziness, palpitations, syncope.  Resp: cough, hemoptysis, dyspnea, wheezing. GI: heartburn, indigestion, abdominal pain, nausea, vomiting, diarrhea, constipation, change in bowel habits, loss of appetite, hematemesis, melena, hematochezia.  GU: dysuria, change in color of urine, urgency or frequency, flank pain, hematuria. MSK: joint pain or swelling, decreased range of motion, LLE edema. Psych: change in mood or affect, depression, anxiety, suicidal ideations, homicidal ideations. Skin: rash, itching, bruising.   Past Medical History  He,  has a past medical history of 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[place]), Arthritis, Atrial enlargement, bilateral, Atrial fibrillation (HCC), Atrial fibrillation ([place]), Atrial flutter ([place]), Complication of anesthesia, Dysrhythmia, GERD (gastroesophageal reflux disease), Hyperaldosteronism ([place]), Hypertension, Kidney cysts, Nephrolithiasis, Pneumonia (1990's), Pre-diabetes, Prostatitis, and Renal insufficiency.   Surgical History    Past Surgical History:  Procedure Laterality Date  . ABLATION    . ATRIAL FIBRILLATION ABLATION N/A 09/28/2012   Procedure: ATRIAL FIBRILLATION ABLATION;  Surgeon: Thompson Grayer, MD;  Location: [facility] CATH LAB;  Service: Cardiovascular;  Laterality: N/A;  . atrial fibrillation ablation with CTI ablation  05/27/11, 09/28/12   ablation for afib and atrial flutter by Dr Rayann Heman  . BACK SURGERY  08/09/2016   C 5 6  C 6 to C 7 cervical fusion  . CARDIOVERSION  07/28/2011   Procedure: CARDIOVERSION;  Surgeon: Lelon Perla, MD;  Location: [place];  Service: Cardiovascular;  Laterality: N/A;  . COLONOSCOPY WITH PROPOFOL N/A 03/03/2015   Procedure: COLONOSCOPY WITH PROPOFOL;  Surgeon: Garlan Fair, MD;  Location: [facility] ENDOSCOPY;  Service: Gastroenterology;  Laterality: N/A;  . double j stent placement  06/2004   left  . ESOPHAGOGASTRODUODENOSCOPY (EGD) WITH PROPOFOL N/A 03/03/2015   Procedure: ESOPHAGOGASTRODUODENOSCOPY  (EGD) WITH PROPOFOL;  Surgeon: Garlan Fair, MD;  Location: [facility] ENDOSCOPY;  Service: Gastroenterology;  Laterality: N/A;  . ESOPHAGOGASTRODUODENOSCOPY (EGD) WITH PROPOFOL N/A 10/25/2016   Procedure: ESOPHAGOGASTRODUODENOSCOPY (EGD) WITH PROPOFOL;  Surgeon: Garlan Fair, MD;  Location: [facility] ENDOSCOPY;  Service: Endoscopy;  Laterality: N/A;  . INGUINAL HERNIA REPAIR     "as a child; ? right"  . INGUINAL HERNIA REPAIR Left 06/23/2016   Procedure: OPEN LEFT INGUINAL HERNIA  REPAIR WITH MESH;  Surgeon: Johnathan Hausen, MD;  Location: [place];  Service: General;  Laterality: Left;  . INSERTION OF MESH Left 06/23/2016   Procedure: INSERTION OF MESH;  Surgeon: Johnathan Hausen, MD;  Location: [place];  Service: General;  Laterality: Left;  . LEFT HEART CATH AND CORONARY ANGIOGRAPHY N/A 07/25/2018   Procedure: LEFT HEART CATH AND CORONARY ANGIOGRAPHY;  Surgeon: Belva Crome, MD;  Location: [place] CV LAB;  Service: Cardiovascular;  Laterality: N/A;  . LUNG BIOPSY  1993  . NASAL SEPTOPLASTY W/ TURBINOPLASTY  07/2006  . prostate biopsy  2011  . schwannoma removal  ~01/2010   left thorax  . TEE WITHOUT CARDIOVERSION N/A 09/27/2012   Procedure: TRANSESOPHAGEAL ECHOCARDIOGRAM (TEE);  Surgeon: Larey Dresser, MD;  Location: [place];  Service: Cardiovascular;  Laterality: N/A;  . ULTRASOUND GUIDANCE FOR VASCULAR ACCESS  07/25/2018   Procedure: Ultrasound Guidance For Vascular Access;  Surgeon: Belva Crome, MD;  Location: [place] CV LAB;  Service: Cardiovascular;;  . UMBILICAL HERNIA REPAIR  07/2006     Social History   reports that he has never smoked. He has never used smokeless tobacco. He reports current alcohol use of about 2.0 standard drinks of alcohol per week. He reports that he does not use drugs.   Family History   His family history includes Atrial fibrillation in his sister and another family member.   Allergies Allergies  Allergen  Reactions  . Aldactone [Spironolactone]     Breast tissue enlargement  . Codeine Other (See Comments)    Headache   . Lisinopril Cough  . Nexium [Esomeprazole Magnesium] Diarrhea  . Sulfa Antibiotics Rash  . Sulfa Drugs Cross Reactors Rash     Home Medications  Prior to Admission medications   Medication Sig Start Date End Date Taking? Authorizing Provider  albuterol (PROVENTIL HFA;VENTOLIN HFA) 108 (90 Base) MCG/ACT inhaler Inhale 2 puffs into the lungs every 6 (six) hours as needed for wheezing or shortness of breath. 08/19/18   Eulogio Bear U, DO  albuterol (PROVENTIL) (2.5 MG/3ML) 0.083% nebulizer solution Take 3 mLs (2.5 mg total) by nebulization every 4 (four) hours as needed for wheezing or shortness of breath. 08/31/18   [name],  Bradley L, DO  allopurinol (ZYLOPRIM) 100 MG tablet Take 100 mg by mouth 2 (two) times daily.     [provider]  atorvastatin (LIPITOR) 10 MG tablet Take 10 mg by mouth every evening.     [provider]  beclomethasone (QVAR) 80 MCG/ACT inhaler Inhale 1 puff into the lungs 2 (two) times daily.    [provider]  CIALIS 20 MG tablet Take 20 mg by mouth daily as needed for erectile dysfunction.  09/23/16   [provider]  diltiazem (CARDIZEM CD) 360 MG 24 hr capsule Take 360 mg by mouth every evening. 10/04/16   [provider]  eplerenone (INSPRA) 50 MG tablet Take 50 mg by mouth 2 (two) times daily.    [provider]  FIBER ADULT GUMMIES PO Take 2 tablets by mouth 2 (two) times daily.     [provider]  Fluticasone-Umeclidin-Vilant (TRELEGY ELLIPTA) 100-62.5-25 MCG/INH AEPB Inhale 1 puff into the lungs daily. 11/28/18   [name], Octavio Graves, DO  ibuprofen (ADVIL,MOTRIN) 200 MG tablet Take 200-400 mg by mouth daily as needed for headache or moderate pain.     [provider]  loperamide (IMODIUM A-D) 2 MG tablet Take 2 mg by mouth 2 (two) times daily.    [provider]  losartan  (COZAAR) 100 MG tablet Take 100 mg by mouth every evening.    [provider]  omeprazole (PRILOSEC) 40 MG capsule Take 40 mg by mouth daily before supper.     [provider]  oxybutynin (DITROPAN-XL) 10 MG 24 hr tablet Take 10 mg by mouth daily. 01/28/18   [provider]  Potassium 99 MG TABS Take 198 mg by mouth 2 (two) times daily.    [provider]  rivaroxaban (XARELTO) 20 MG TABS tablet TAKE 1 TABLET(20 MG) BY MOUTH DAILY WITH SUPPER 08/19/18   Eulogio Bear U, DO  sertraline (ZOLOFT) 50 MG tablet Take 1 tablet (50 mg total) by mouth every evening. 12/07/17   Eksir, Richard Miu, MD  vitamin B-12 (CYANOCOBALAMIN) 1000 MCG tablet Take 1,000 mcg by mouth daily.    [provider]      Montey Hora, [place] [facility] Pager: [phone].  If no answer, [phone] 03/14/2019, 3:11 PM

## 2019-03-15 LAB — CBC
HCT: 39.5 % (ref 39.0–52.0)
Hemoglobin: 13 g/dL (ref 13.0–17.0)
MCH: 29 pg (ref 26.0–34.0)
MCHC: 32.9 g/dL (ref 30.0–36.0)
MCV: 88 fL (ref 80.0–100.0)
Platelets: 150 10*3/uL (ref 150–400)
RBC: 4.49 MIL/uL (ref 4.22–5.81)
RDW: 15.6 % — ABNORMAL HIGH (ref 11.5–15.5)
WBC: 9 10*3/uL (ref 4.0–10.5)
nRBC: 0 % (ref 0.0–0.2)

## 2019-03-15 LAB — BASIC METABOLIC PANEL
Anion gap: 10 (ref 5–15)
BUN: 22 mg/dL (ref 8–23)
CO2: 26 mmol/L (ref 22–32)
Calcium: 8.7 mg/dL — ABNORMAL LOW (ref 8.9–10.3)
Chloride: 104 mmol/L (ref 98–111)
Creatinine, Ser: 1.3 mg/dL — ABNORMAL HIGH (ref 0.61–1.24)
GFR calc Af Amer: >60 mL/min (ref >60)
GFR calc non Af Amer: 58 mL/min — ABNORMAL LOW (ref >60)
Glucose, Bld: 90 mg/dL (ref 70–99)
Potassium: 3.7 mmol/L (ref 3.5–5.1)
Sodium: 140 mmol/L (ref 135–145)

## 2019-03-15 LAB — HEPARIN LEVEL (UNFRACTIONATED): Heparin Unfractionated: 0.42 IU/mL (ref 0.30–0.70)

## 2019-03-15 LAB — ANTITHROMBIN III: AntiThromb III Func: 79 % (ref 75–120)

## 2019-03-15 LAB — MAGNESIUM: Magnesium: 1.7 mg/dL (ref 1.7–2.4)

## 2019-03-15 LAB — MRSA PCR SCREENING: MRSA by PCR: NEGATIVE

## 2019-03-15 LAB — PHOSPHORUS: Phosphorus: 3.7 mg/dL (ref 2.5–4.6)

## 2019-03-15 MED ORDER — DILTIAZEM HCL ER COATED BEADS 180 MG PO CP24
360.0000 mg | ORAL_CAPSULE | Freq: Every evening | ORAL | Status: DC
  Administered 2019-03-15 – 2019-03-16 (×2): 360 mg via ORAL
  Filled 2019-03-15 (×3): qty 2

## 2019-03-15 MED ORDER — PANTOPRAZOLE SODIUM 40 MG PO TBEC
40.0000 mg | DELAYED_RELEASE_TABLET | Freq: Every day | ORAL | Status: DC
  Administered 2019-03-15: 40 mg via ORAL
  Filled 2019-03-15: qty 1

## 2019-03-15 MED ORDER — SPIRONOLACTONE 25 MG PO TABS
25.0000 mg | ORAL_TABLET | Freq: Every day | ORAL | Status: DC
  Filled 2019-03-15: qty 1

## 2019-03-15 MED ORDER — LOSARTAN POTASSIUM 50 MG PO TABS: 100.0000 mg | ORAL_TABLET | Freq: Every evening | ORAL | Status: DC

## 2019-03-15 MED ORDER — OXYBUTYNIN CHLORIDE ER 5 MG PO TB24
10.0000 mg | ORAL_TABLET | Freq: Every day | ORAL | Status: DC
  Administered 2019-03-15 – 2019-03-17 (×3): 10 mg via ORAL
  Filled 2019-03-15 (×4): qty 2

## 2019-03-15 MED ORDER — PANTOPRAZOLE SODIUM 40 MG PO TBEC
40.0000 mg | DELAYED_RELEASE_TABLET | Freq: Every evening | ORAL | Status: DC
  Administered 2019-03-16: 40 mg via ORAL
  Filled 2019-03-15: qty 1

## 2019-03-15 MED ORDER — VITAMIN C 500 MG PO TABS
1000.0000 mg | ORAL_TABLET | Freq: Every day | ORAL | Status: DC
  Administered 2019-03-15 – 2019-03-17 (×3): 1000 mg via ORAL
  Filled 2019-03-15 (×3): qty 2

## 2019-03-15 MED ORDER — SERTRALINE HCL 100 MG PO TABS
100.0000 mg | ORAL_TABLET | Freq: Every evening | ORAL | Status: DC
  Administered 2019-03-15 – 2019-03-16 (×2): 100 mg via ORAL
  Filled 2019-03-15 (×2): qty 1

## 2019-03-15 MED ORDER — VITAMIN B-12 1000 MCG PO TABS
1000.0000 ug | ORAL_TABLET | Freq: Every day | ORAL | Status: DC
  Administered 2019-03-15 – 2019-03-17 (×3): 1000 ug via ORAL
  Filled 2019-03-15 (×3): qty 1

## 2019-03-15 MED ORDER — LOSARTAN POTASSIUM 50 MG PO TABS
100.0000 mg | ORAL_TABLET | Freq: Every day | ORAL | Status: DC
  Administered 2019-03-15 – 2019-03-17 (×3): 100 mg via ORAL
  Filled 2019-03-15 (×3): qty 2

## 2019-03-15 MED ORDER — HYDROCHLOROTHIAZIDE 12.5 MG PO CAPS
12.5000 mg | ORAL_CAPSULE | Freq: Every day | ORAL | Status: DC
  Administered 2019-03-15 – 2019-03-17 (×3): 12.5 mg via ORAL
  Filled 2019-03-15 (×3): qty 1

## 2019-03-15 MED ORDER — ALLOPURINOL 100 MG PO TABS
100.0000 mg | ORAL_TABLET | Freq: Two times a day (BID) | ORAL | Status: DC
  Administered 2019-03-15 – 2019-03-17 (×5): 100 mg via ORAL
  Filled 2019-03-15 (×5): qty 1

## 2019-03-15 NOTE — Progress Notes (Signed)
ANTICOAGULATION CONSULT NOTE - Follow Up Consult  Pharmacy Consult for Heparin Indication: bilateral pulmonary embolus and DVT  Allergies  Allergen Reactions  . Aldactone [Spironolactone]     Breast tissue enlargement  . Codeine Other (See Comments)    Headache   . Lisinopril Cough  . Nexium [Esomeprazole Magnesium] Diarrhea  . Sulfa Antibiotics Rash  . Sulfa Drugs Cross Reactors Rash    Patient Measurements: Weight: 235 lb 3.7 oz (106.7 kg) Heparin Dosing Weight: 104 kg  Vital Signs: Temp: 98.7 F (37.1 C) (08/20 0749) Temp Source: Oral (08/20 0749) BP: 134/87 (08/20 0410) Pulse Rate: 51 (08/20 0500)  Labs: Recent Labs    03/14/19 0934 03/14/19 1202 03/14/19 1536 03/14/19 1823 03/15/19 0313  HGB 14.1  --   --   --  13.0  HCT 44.4  --   --   --  39.5  PLT 182  --   --   --  150  LABPROT 13.9  --   --   --   --   INR 1.1  --   --   --   --   HEPARINUNFRC  --   --   --  0.31 0.42  CREATININE 1.59*  --   --   --  1.30*  TROPONINIHS 21* 61* 84*  --   --     CrCl cannot be calculated (Unknown ideal weight.).   Medications:  Scheduled:  . atorvastatin  10 mg Oral q1800  . fluticasone furoate-vilanterol  1 puff Inhalation Daily   And  . umeclidinium bromide  1 puff Inhalation Daily   Infusions:  . heparin 1,750 Units/hr (03/15/19 0107)  . lactated ringers 125 mL/hr at 03/15/19 0113    Assessment: 63 yo M presented to ED with near syncopal episode. Pt found to have L leg DVT and submassive bilateral PE and was started on heparin infusion. Pt reports to have been on anticoagulation in the past for afib and RLE superficial DVT but has not been anticoagulated for 6 months PTA. Vascular not planning intervention.  Heparin level remains therapeutic at 0.42. CBC wnl. No active bleed issues documented. CCM planning heparin x 48 hrs prior to transitioning to [place].  Goal of Therapy:  Heparin level 0.3-0.7 units/ml Monitor platelets by anticoagulation protocol:  Yes   Plan:  Continue heparin at 1750 units/hr Monitor daily heparin level and CBC, s/sx bleeding F/u plan for long-term anticoagulation as appropriate  Elicia Lamp, PharmD, BCPS Please check AMION for all [place] contact numbers Clinical Pharmacist 03/15/2019 9:25 AM

## 2019-03-15 NOTE — Progress Notes (Signed)
Patient ID: Casey [name], Dr., male   DOB: 1956-06-28, 63 y.o.   MRN: [id number] Comfortable.  Mild left leg tenderness. No resp distress

## 2019-03-15 NOTE — Progress Notes (Signed)
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 Anti-hypertensives held likely due to concern for potential hemodynamic instability but pt has been hemodynamically stable and did not require EKOS. His BP is now trending up.  eICU Interventions  Order placed to resume his home anti-hypertensive medications.        Kerry Kass Delancey Moraes 03/15/2019, 9:26 PM

## 2019-03-15 NOTE — Progress Notes (Signed)
NAME:  Casey [name], Dr., MRN:  [id number], DOB:  1956-01-28, LOS: 1 ADMISSION DATE:  03/14/2019, CONSULTATION DATE:  03/14/2019 REFERRING MD:  Laurin Coder CHIEF COMPLAINT:  Syncope, PE  Brief History   63yo Male admitted to PCCM for saddle PE, DVT.   History of present illness   63yo M, who is a Radiation protection practitioner ENT physician, PMH AF/Aflutter s/p ablation, adrenal cortical hyperfunction, HTN, HLD, Hyperaldosteronism, renal insufficiency, RLE superficial DVT previously on Xarelto.  He presented to [facility] ED 8/19 after he had abrupt onset lightheadedness and had to crouch and sink to ground while standing at a workstation (was at [facility] [employer] [facility] about to perform a tonsillectomy). He did not have any loss of consciousness. This was associated with "funny" sensation across his chest. Symptoms resolved after he was in a seated position.  He states he has had pain behind his left knee that began a few weeks ago which he thought was a cramp then noticed the pain extending to his groin over the past week. He also had dyspnea on exertion.  He was treated for superficial DVT in the past with Xarelto which was discontinued approx 6 months ago.  He has had LLE pitting edema which has worsened over the past week.  Work up in the ED included LE duplex which revealed acute deep vein thrombosis involving the left femoral vein, left popliteal vein, left posterior tibial veins, and left gastrocnemius veins. Subsequent CTA chest revealed large acute pulmonary embolus extending into both pulmonary arteries and their lower lobe branches with evidence of right heart strain (RV/LV Ratio = 1.2).   He was started on heparin and PCCM was called to evaluate.  He remained hemodynamically stable.  He states that he is fairly active and walks up to 3 miles 3 - 4 days a week.  Does have family hx of VTE in his father, though these were felt to be provoked after plane trip and orthopedic hip surgery.  Denies any hx of malignancy.    Past Medical  History  AF/Aflutter s/p ablation, adrenal cortical hyperfunction, HTN, HLD, Hyperaldosteronism, renal insufficiency, RLE superficial DVT previously on Xarelto.    Significant Hospital Events   8/19 > admit.  Consults:  PCCM.  Procedures:  None.  Significant Diagnostic Tests:  8/19 LE duplex > DVT  involving the left femoral vein, left popliteal vein, left posterior tibial veins, and left gastrocnemius veins. 8/19 CTA Chest > large saddle PE, RV / LV = 1.2. Echo 8/19 > EF 45-50%, LV mildly dilated with mild increase in wall thickness, RV mod reduced systolic function with mod dilation.  Micro Data:  SARS CoV2 8/19 > neg.  Antimicrobials:  None.   Interim history/subjective:  On room air with sats in high 90s.  Comfortable. Asking to eat.  Objective   Blood pressure 134/87, pulse (!) 51, temperature 98.7 F (37.1 C), temperature source Oral, resp. rate 19, weight 106.7 kg, SpO2 96 %.        Intake/Output Summary (Last 24 hours) at 03/15/2019 0940 Last data filed at 03/15/2019 0702 Gross per 24 hour  Intake 3333.09 ml  Output 1250 ml  Net 2083.09 ml   Filed Weights   03/14/19 1200 03/14/19 1700  Weight: 104.3 kg 106.7 kg    Examination: General: Adult male, resting in bed, in NAD. Neuro: A&O x 3, no deficits. HEENT: [place]/AT. Sclerae anicteric. EOMI. Cardiovascular: RRR, no M/R/G.  Lungs: Respirations even and unlabored.  CTA bilaterally, No W/R/R. Abdomen: BS x  4, soft, NT/ND.  Musculoskeletal: No gross deformities. LLE pitting edema up to mid tibia. Skin: Intact, warm, no rashes.   Assessment & Plan:   Large saddle PE with LLE DVT - CT with RV / LV of 1.2.  Echo with mildly reduced LVEF and no significant RV failure. - Continue heparin gtt. - No need for EKOS or systemic lytics. - Continue heparin for another 24 hours then transition to xarelto or eliquis (he has tolerated xarelto well in the past). - Likely needs lifelong anticoagulation / DOAC.  Hx HTN,  HLD. - Continue preadmission atorvastatin, eplerenone, diltiazem. - Hold preadmission losartan in light of renal insufficiency.  Renal insufficiency - improving. - LR @ 100. - Follow BMP.  Best practice:  Diet: Heart healthy diet. Pain/Anxiety/Delirium protocol (if indicated): N/A. VAP protocol (if indicated): N/A. DVT prophylaxis: Heparin gtt. GI prophylaxis: N/A. Glucose control: N/A. Mobility: Bedrest. Code Status: Full. Family Communication: Wife updated at bedside on admission. Disposition:  SDU.   Casey [name], [facility] Pager: [phone].  If no answer, [phone] 03/15/2019, 9:40 AM

## 2019-03-16 ENCOUNTER — Encounter (HOSPITAL_COMMUNITY): Payer: Self-pay | Admitting: Pulmonary Disease

## 2019-03-16 LAB — BASIC METABOLIC PANEL
Anion gap: 11 (ref 5–15)
BUN: 20 mg/dL (ref 8–23)
CO2: 25 mmol/L (ref 22–32)
Calcium: 8.6 mg/dL — ABNORMAL LOW (ref 8.9–10.3)
Chloride: 104 mmol/L (ref 98–111)
Creatinine, Ser: 1.45 mg/dL — ABNORMAL HIGH (ref 0.61–1.24)
GFR calc Af Amer: 59 mL/min — ABNORMAL LOW (ref >60)
GFR calc non Af Amer: 51 mL/min — ABNORMAL LOW (ref >60)
Glucose, Bld: 105 mg/dL — ABNORMAL HIGH (ref 70–99)
Potassium: 3.5 mmol/L (ref 3.5–5.1)
Sodium: 140 mmol/L (ref 135–145)

## 2019-03-16 LAB — CBC
HCT: 39.4 % (ref 39.0–52.0)
Hemoglobin: 12.9 g/dL — ABNORMAL LOW (ref 13.0–17.0)
MCH: 28.9 pg (ref 26.0–34.0)
MCHC: 32.7 g/dL (ref 30.0–36.0)
MCV: 88.1 fL (ref 80.0–100.0)
Platelets: 161 10*3/uL (ref 150–400)
RBC: 4.47 MIL/uL (ref 4.22–5.81)
RDW: 15.5 % (ref 11.5–15.5)
WBC: 9.4 10*3/uL (ref 4.0–10.5)
nRBC: 0 % (ref 0.0–0.2)

## 2019-03-16 LAB — BETA-2-GLYCOPROTEIN I ABS, IGG/M/A
Beta-2 Glyco I IgG: <9 GPI IgG units (ref 0–20)
Beta-2-Glycoprotein I IgA: <9 GPI IgA units (ref 0–25)
Beta-2-Glycoprotein I IgM: <9 GPI IgM units (ref 0–32)

## 2019-03-16 LAB — CARDIOLIPIN ANTIBODIES, IGG, IGM, IGA
Anticardiolipin IgA: <9 APL U/mL (ref 0–11)
Anticardiolipin IgG: <9 GPL U/mL (ref 0–14)
Anticardiolipin IgM: <9 MPL U/mL (ref 0–12)

## 2019-03-16 LAB — HEPARIN LEVEL (UNFRACTIONATED): Heparin Unfractionated: 0.28 IU/mL — ABNORMAL LOW (ref 0.30–0.70)

## 2019-03-16 LAB — HOMOCYSTEINE: Homocysteine: 13.6 umol/L (ref 0.0–17.2)

## 2019-03-16 MED ORDER — APIXABAN 5 MG PO TABS
10.0000 mg | ORAL_TABLET | Freq: Two times a day (BID) | ORAL | Status: DC
  Administered 2019-03-16 – 2019-03-17 (×3): 10 mg via ORAL
  Filled 2019-03-16 (×3): qty 2

## 2019-03-16 MED ORDER — ELIQUIS 5 MG VTE STARTER PACK: ORAL_TABLET | ORAL | 0 refills | Status: DC

## 2019-03-16 MED ORDER — APIXABAN 5 MG PO TABS: 5.0000 mg | ORAL_TABLET | Freq: Two times a day (BID) | ORAL | Status: DC

## 2019-03-16 MED FILL — ELIQUIS STARTER PACK 5 MG T: 5 | 30 days supply | Qty: 74 | Fill #0

## 2019-03-16 NOTE — Progress Notes (Signed)
Patient ID: Casey [name], Dr., male   DOB: July 21, 1956, 63 y.o.   MRN: [id number] Comfortable.  No respiratory distress.  Mild swelling in his left leg versus right. Have recommended knee-high 20 to 30 mmHg graduated compression stockings on his left leg to reduce future postphlebitic syndrome.  Will not follow actively.  He will call our office for any questions regarding left leg DVT.  Does not need follow-up duplex

## 2019-03-16 NOTE — Progress Notes (Signed)
ANTICOAGULATION CONSULT NOTE - Follow Up Consult  Pharmacy Consult for Heparin >> apixaban Indication: bilateral pulmonary embolus and DVT   Patient Measurements: Height: 6\' 1"  (185.4 cm) Weight: 235 lb 3.7 oz (106.7 kg) IBW/kg (Calculated) : 79.9 Heparin Dosing Weight: 104 kg  Vital Signs: Temp: 97.4 F (36.3 C) (08/21 0752) Temp Source: Oral (08/21 0752) BP: 150/87 (08/21 0752) Pulse Rate: 51 (08/21 0752)  Labs: Recent Labs    03/14/19 0934 03/14/19 1202 03/14/19 1536 03/14/19 1823 03/15/19 0313 03/16/19 0542  HGB 14.1  --   --   --  13.0 12.9*  HCT 44.4  --   --   --  39.5 39.4  PLT 182  --   --   --  150 161  LABPROT 13.9  --   --   --   --   --   INR 1.1  --   --   --   --   --   HEPARINUNFRC  --   --   --  0.31 0.42 0.28*  CREATININE 1.59*  --   --   --  1.30* 1.45*  TROPONINIHS 21* 61* 84*  --   --   --      Medications:  Scheduled:  . allopurinol  100 mg Oral BID  . atorvastatin  10 mg Oral q1800  . diltiazem  360 mg Oral QPM  . fluticasone furoate-vilanterol  1 puff Inhalation Daily   And  . umeclidinium bromide  1 puff Inhalation Daily  . hydrochlorothiazide  12.5 mg Oral Daily  . losartan  100 mg Oral Daily  . oxybutynin  10 mg Oral Daily  . pantoprazole  40 mg Oral QPM  . sertraline  100 mg Oral QPM  . vitamin B-12  1,000 mcg Oral Daily  . vitamin C  1,000 mg Oral Daily   Infusions:  . heparin 2,000 Units/hr (03/16/19 0736)    Assessment: 63 yo M presented to ED with near syncopal episode. Pt found to have L leg DVT and submassive bilateral PE and was started on heparin infusion. Pt reports to have been on anticoagulation in the past for afib and RLE superficial DVT but has not been anticoagulated for 6 months PTA. Vascular not planning intervention.  Pharmacy consulted to transition heparin to apixaban. CBC stable. SCr 1.45 stable (baseline ~1.5-1.6).  Goal of Therapy:  Heparin level 0.3-0.7 units/ml Monitor platelets by anticoagulation  protocol: Yes   Plan:  D/c heparin at time of 1st dose of apixaban - communicated plan with RN Apixaban 10mg  PO BID x 7 days; then 5mg  PO BID Monitor CBC, s/sx bleeding   Elicia Lamp, PharmD, BCPS Please check AMION for all [facility] contact numbers Clinical Pharmacist 03/16/2019 10:02 AM

## 2019-03-16 NOTE — Discharge Instructions (Addendum)
Information on my medicine - ELIQUIS (apixaban)  This medication education was reviewed with me or my healthcare representative as part of my discharge preparation.  Why was Eliquis prescribed for you? Eliquis was prescribed to treat blood clots that may have been found in the veins of your legs (deep vein thrombosis) or in your lungs (pulmonary embolism) and to reduce the risk of them occurring again.  What do You need to know about Eliquis ? The starting dose is 10 mg (two 5 mg tablets) taken TWICE daily for the FIRST SEVEN (7) DAYS, then on (enter date)  03/23/2019  the dose is reduced to ONE 5 mg tablet taken TWICE daily.  Eliquis may be taken with or without food.   Try to take the dose about the same time in the morning and in the evening. If you have difficulty swallowing the tablet whole please discuss with your pharmacist how to take the medication safely.  Take Eliquis exactly as prescribed and DO NOT stop taking Eliquis without talking to the doctor who prescribed the medication.  Stopping may increase your risk of developing a new blood clot.  Refill your prescription before you run out.  After discharge, you should have regular check-up appointments with your healthcare provider that is prescribing your Eliquis.    What do you do if you miss a dose? If a dose of ELIQUIS is not taken at the scheduled time, take it as soon as possible on the same day and twice-daily administration should be resumed. The dose should not be doubled to make up for a missed dose.  Important Safety Information A possible side effect of Eliquis is bleeding. You should call your healthcare provider right away if you experience any of the following: ? Bleeding from an injury or your nose that does not stop. ? Unusual colored urine (red or dark brown) or unusual colored stools (red or black). ? Unusual bruising for unknown reasons. ? A serious fall or if you hit your head (even if there is no  bleeding).  Some medicines may interact with Eliquis and might increase your risk of bleeding or clotting while on Eliquis. To help avoid this, consult your healthcare provider or pharmacist prior to using any new prescription or non-prescription medications, including herbals, vitamins, non-steroidal anti-inflammatory drugs (NSAIDs) and supplements.  This website has more information on Eliquis (apixaban): http://www.eliquis.com/eliquis/home  [email or web address]

## 2019-03-16 NOTE — Discharge Summary (Signed)
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  Admit date: 03/14/2019 Discharge date: 03/17/2019    Discharge Diagnoses:  Acute Submassive Pulmonary Embolism Acute Left Lower Extremity DVT  Chronic Renal Insufficiency  HTN    Hx AF Gout  Moderate Obstructive Lung Disease                                                                    DISCHARGE PLAN BY DIAGNOSIS     Acute Submassive Pulmonary Embolism Acute Left Lower Extremity DVT   Discharge Plan: Continue Eliquis  Casey [name] will need life long anticoagulation  Follow up hypercoagulable work up Follow up with Dr. Valeta Harms as planned  Bleeding risks reviewed with Casey [name] as well as caution with activities (shaving, food prep etc)  Follow up ECHO in 6 months  Chronic Renal Insufficiency   Discharge Plan: Medications renally adjusted  Casey [name] instructed to avoid NSAIDS  HTN    Hx AF HLD  Discharge Plan: HOLD home losartan, review at follow up with BMP for discussion of restart Continue Cardizem, Eplerenone, Atorvastatin   Gout   Discharge Plan: Resume home allopurinol   Moderate Obstructive Lung Disease   Discharge Plan: Continue Trelegy                      DISCHARGE SUMMARY   63 y/o Casey [name] who presented to [facility] on [facility]/14/19 with reports abrupt onset lightheadedness and dizziness.    Casey Casey [name] is Casey Casey [name] and Casey Casey [name] was preparing to go into surgery when Casey Casey [name] felt lightheaded and dizzy.  Casey Casey [name] had to crouch to Casey ground while standing at Casey workstation and sit.  Casey Casey [name] reported Casey heaviness that felt "funny" across his chest but Casey symptoms resolved after sitting down.  Casey Casey [name] had noted pain behind his left knee that went into his medial thigh.  Additionally, Casey Casey [name] had dyspnea on exertion and mild LE swelling.  In Casey past Casey Casey [name] has been treated for superficial DVT with Xarelto.    Initial ER evaluation included an elevated D-Dimer to  15.75.  Lower extremity venous duplex revealed an acute deep vein thrombosis involving Casey left femoral vein, left popliteal vein, left posterior tibial veins, and left gastrocnemius veins.  Follow up CTA chest PE protocol showed Casey large saddle pulmonary embolus extending into both pulmonary arteries and their lower lobe branches with evidence of right heart strain (RV/LV ratio 1.2).  Casey Casey [name] was started on IV heparin gtt.  Interventional Radiology was consulted for possible EKOS.  Casey Casey [name] remained hemodynamically stable and did not require EKOS.  ECHO assessment showed Casey reduction of LVEF to 45-50%, abnormal septal motion and distal septal hypokinesis, RV systolic function moderately reduced with moderate RV enlargement.  Additional findings during hospitalization included acute on chronic renal insufficiency.  Home losartan was held during admission.  Casey Casey [name] was transitioned to Eliquis given renal insufficiency.  Casey Casey [name] was medically cleared for discharge on 8/22  with plans as above.    SIGNIFICANT DIAGNOSTIC STUDIES ECHO 8/19 >> LVEF 45-50%, LV mildly dilated, abnormal septal motion and distal septal hypokinesis, RV moderately reduced systolic function, RV moderately enlarged CTA PE 8/19 >> large acute pulmonary embolus extending into  both pulmonary arteries and their lower lobe branches, RV/LV ratio of 1.2 consistent with submassive / intermediate risk PE, 5 mm nodules in Casey LUL with surrounding ground-glass opacity   HEMATOLOGY STUDIES  Cardiolipin Antibodies 8/20 >>  Prothrombin 20210 8/20 >>  Factor V Leiden 8/20 >>  Homocysteine 8/20 >> 13.6 Beta-2 glycoprotein 8/20 >> less than 9 Lupus Anticoagulant 8/20 >>  Protein S 8/20 >>  Protein C 8/20 >> 98 Antithrombin III 8/20 >> 79  MICRO DATA  COVID 8/19 >> negative     Discharge Exam: General: 63 year old male resting in bed not in distress Neuro: intact CV: RRR w/out MRG  PULM:clear  HQ:PRFF not tender  Extremities: mild LLE edema    Vitals:   03/16/19 2300 03/17/19 0331 03/17/19 0747 03/17/19 0852  BP: 127/89 133/84 140/86   Pulse:  (Abnormal) 51 (Abnormal) 57   Resp:      Temp: 98.8 F (37.1 C) 98.6 F (37 C) 98.5 F (36.9 C)   TempSrc: Axillary Oral Oral   SpO2:  94% 95% 96%  Weight:      Height:         Discharge Labs  BMET Recent Labs  Lab 03/14/19 0934 03/15/19 0313 03/16/19 0542 03/17/19 0415  NA 140 140 140 140  K 3.7 3.7 3.5 3.0*  CL 106 104 104 103  CO2 _0 GLUCOSE 136* 90 105* 90  BUN 31* _1 CREATININE 1.59* 1.30* 1.45* 1.51*  CALCIUM 9.4 8.7* 8.6* 8.8*  MG 1.7 1.7  --   --   PHOS 2.7 3.7  --   --     CBC Recent Labs  Lab 03/15/19 0313 03/16/19 0542 03/17/19 0415  HGB 13.0 12.9* 12.6*  HCT 39.5 39.4 39.3  WBC 9.0 9.4 9.9  PLT 150 161 185    Anti-Coagulation Recent Labs  Lab 03/14/19 0934  INR 1.1     Follow-up Information    [name], Bradley L, DO Follow up on 03/28/2019.   Specialty: Pulmonary Disease Why: Your appointment is at 9:45AM Contact information: [place] [address] [phone]            Allergies as of 03/17/2019    Allergen Reactions Comment   Aldactone [spironolactone]  Breast tissue enlargement   Codeine Other (See Comments) Headache   Lisinopril Cough    Nexium [esomeprazole Magnesium] Diarrhea    Sulfa Antibiotics Rash    Sulfa Drugs Cross Reactors Rash       Medication List    Stop taking these medications   ibuprofen 200 MG tablet Commonly known as: ADVIL   losartan 100 MG tablet Commonly known as: COZAAR     Take these medications   albuterol 108 (90 Base) MCG/ACT inhaler Commonly known as: VENTOLIN HFA Inhale 2 puffs into Casey lungs every 6 (six) hours as needed for wheezing or shortness of breath.   albuterol (2.5 MG/3ML) 0.083% nebulizer solution Commonly known as: PROVENTIL Take 3 mLs (2.5 mg total) by nebulization every 4 (four) hours as needed for wheezing or shortness  of breath.   allopurinol 100 MG tablet Commonly known as: ZYLOPRIM Take 100 mg by mouth 2 (two) times daily.   atorvastatin 10 MG tablet Commonly known as: LIPITOR Take 10 mg by mouth every evening.   Cialis 20 MG tablet Generic drug: tadalafil Take 20 mg by mouth daily as needed for erectile dysfunction.   diltiazem 360 MG 24  hr capsule Commonly known as: CARDIZEM CD Take 360 mg by mouth every evening.   Eliquis DVT/PE Starter Pack 5 MG Tabs Take as directed on package: start with two-48m tablets twice daily for 7 days. On day 8, switch to one-529mtablet twice daily.   eplerenone 50 MG tablet Commonly known as: INSPRA Take 50 mg by mouth 2 (two) times daily.   Fluticasone-Umeclidin-Vilant 100-62.5-25 MCG/INH Aepb Commonly known as: Trelegy Ellipta Inhale 1 puff into Casey lungs daily.   loperamide 2 MG tablet Commonly known as: IMODIUM Casey-D Take 2 mg by mouth 2 (two) times daily.   omeprazole 40 MG capsule Commonly known as: PRILOSEC Take 40 mg by mouth daily before supper.   oxybutynin 10 MG 24 hr tablet Commonly known as: DITROPAN-XL Take 10 mg by mouth daily.   Potassium 99 MG Tabs Take 198 mg by mouth 2 (two) times daily.   sertraline 50 MG tablet Commonly known as: ZOLOFT Take 1 tablet (50 mg total) by mouth every evening. What changed: how much to take   vitamin B-12 1000 MCG tablet Commonly known as: CYANOCOBALAMIN Take 1,000 mcg by mouth daily.   vitamin C 1000 MG tablet Take 1,000 mg by mouth daily.   ZINC PO Take 1 tablet by mouth 2 (two) times daily.        Disposition:  Home.  No new home health needs identified at time of discharge.   Discharged Condition: KaAMRIT CRESSDr. has met maximum benefit of inpatient care and is medically stable and cleared for discharge.  Casey [name] is pending follow up as above.     Time spent on disposition:  34 Minutes.   Signed: PeErick ColaceCNP-BC [place] Detourager # [id number]  # [phone] no answer

## 2019-03-16 NOTE — Progress Notes (Signed)
Received patient from [place], [place]. Introductions and plan made for the day.  Patient responding well to increased ambulation throughout the day. Independently ambulating to bathroom to toilet and shower without incident, issues, or complaints.

## 2019-03-16 NOTE — Progress Notes (Signed)
ANTICOAGULATION CONSULT NOTE - Follow Up Consult  Pharmacy Consult for Heparin Indication: bilateral pulmonary embolus and DVT   Patient Measurements: Height: 6\' 1"  (185.4 cm) Weight: 235 lb 3.7 oz (106.7 kg) IBW/kg (Calculated) : 79.9 Heparin Dosing Weight: 104 kg  Vital Signs: Temp: 98.6 F (37 C) (08/21 0300) Temp Source: Oral (08/21 0300) BP: 140/88 (08/21 0300) Pulse Rate: 62 (08/21 0300)  Labs: Recent Labs    03/14/19 0934 03/14/19 1202 03/14/19 1536 03/14/19 1823 03/15/19 0313 03/16/19 0542  HGB 14.1  --   --   --  13.0 12.9*  HCT 44.4  --   --   --  39.5 39.4  PLT 182  --   --   --  150 161  LABPROT 13.9  --   --   --   --   --   INR 1.1  --   --   --   --   --   HEPARINUNFRC  --   --   --  0.31 0.42 0.28*  CREATININE 1.59*  --   --   --  1.30* 1.45*  TROPONINIHS 21* 61* 84*  --   --   --      Medications:  Scheduled:  . allopurinol  100 mg Oral BID  . atorvastatin  10 mg Oral q1800  . diltiazem  360 mg Oral QPM  . fluticasone furoate-vilanterol  1 puff Inhalation Daily   And  . umeclidinium bromide  1 puff Inhalation Daily  . hydrochlorothiazide  12.5 mg Oral Daily  . losartan  100 mg Oral Daily  . oxybutynin  10 mg Oral Daily  . pantoprazole  40 mg Oral QPM  . sertraline  100 mg Oral QPM  . vitamin B-12  1,000 mcg Oral Daily  . vitamin C  1,000 mg Oral Daily   Infusions:  . heparin 1,750 Units/hr (03/16/19 [id number])    Assessment: 63 yo M presented to ED with near syncopal episode. Pt found to have L leg DVT and submassive bilateral PE and was started on heparin infusion. Pt reports to have been on anticoagulation in the past for afib and RLE superficial DVT but has not been anticoagulated for 6 months PTA. Vascular not planning intervention.  Heparin level fell to subtherapeutic level this morning. CBC wnl.  Goal of Therapy:  Heparin level 0.3-0.7 units/ml Monitor platelets by anticoagulation protocol: Yes   Plan:  Increase heparin to 2000  units/hr Daily HL, CBC Check level in 6 hours   Harvel Quale 03/16/2019 7:11 AM

## 2019-03-16 NOTE — Progress Notes (Addendum)
NAME:  Casey [name], Dr., MRN:  [id number], DOB:  1956-07-01, LOS: 2 ADMISSION DATE:  03/14/2019, CONSULTATION DATE:  03/14/2019 REFERRING MD:  Laurin Coder CHIEF COMPLAINT:  Syncope, PE  Brief History   63yo Male admitted to PCCM for saddle PE, DVT.   History of present illness   63yo M, who is a Radiation protection practitioner ENT physician, PMH AF/Aflutter s/p ablation, adrenal cortical hyperfunction, HTN, HLD, Hyperaldosteronism, renal insufficiency, RLE superficial DVT previously on Xarelto.  He presented to [facility] ED 8/19 after he had abrupt onset lightheadedness and had to crouch and sink to ground while standing at a workstation (was at [employer] about to perform a tonsillectomy). He did not have any loss of consciousness. This was associated with "funny" sensation across his chest. Symptoms resolved after he was in a seated position.  He states he has had pain behind his left knee that began a few weeks ago which he thought was a cramp then noticed the pain extending to his groin over the past week. He also had dyspnea on exertion.  He was treated for superficial DVT in the past with Xarelto which was discontinued approx 6 months ago.  He has had LLE pitting edema which has worsened over the past week.  Work up in the ED included LE duplex which revealed acute deep vein thrombosis involving the left femoral vein, left popliteal vein, left posterior tibial veins, and left gastrocnemius veins. Subsequent CTA chest revealed large acute pulmonary embolus extending into both pulmonary arteries and their lower lobe branches with evidence of right heart strain (RV/LV Ratio = 1.2).   He was started on heparin and PCCM was called to evaluate.  He remained hemodynamically stable.  He states that he is fairly active and walks up to 3 miles 3 - 4 days a week.  Does have family hx of VTE in his father, though these were felt to be provoked after plane trip and orthopedic hip surgery.  Denies any hx of malignancy.    Past Medical  History  AF/Aflutter s/p ablation, adrenal cortical hyperfunction, HTN, HLD, Hyperaldosteronism, renal insufficiency, RLE superficial DVT previously on Xarelto.    Significant Hospital Events   8/19 Admit  Consults:  PCCM  Procedures:    Significant Diagnostic Tests:  LE Duplex 8/19 > DVT  involving the left femoral vein, left popliteal vein, left posterior tibial veins, and left gastrocnemius veins. CTA Chest 8/19 > large saddle PE, RV / LV = 1.2. ECHO 8/19 > EF 45-50%, LV mildly dilated with mild increase in wall thickness, RV mod reduced systolic function with mod dilation.  Micro Data:  SARS CoV2 8/19 > neg.  Antimicrobials:    Interim history/subjective:  Pt reports he "feels fine".  Denies chest pain, SOB.  Denies LE swelling. Reports he desaturated last night but wasn't sure if it was related to an inaccurate wave form.   Objective   Blood pressure (!) 150/87, pulse (!) 51, temperature (!) 97.4 F (36.3 C), temperature source Oral, resp. rate 20, height 6\' 1"  (1.854 m), weight 106.7 kg, SpO2 97 %.        Intake/Output Summary (Last 24 hours) at 03/16/2019 0941 Last data filed at 03/16/2019 0643 Gross per 24 hour  Intake 656.5 ml  Output 1225 ml  Net -568.5 ml   Filed Weights   03/14/19 1200 03/14/19 1700  Weight: 104.3 kg 106.7 kg    Examination: General: pleasant adult male sitting up on side of bed in NAD  HEENT: MM pink/moist, no jvd, good dentition  Neuro: AAOx4, speech clear, MAE  CV: s1s2 rrr, no m/r/g PULM:  Even/non-labored on RA, lungs bilaterally clear anterior, diminished bibasilar posterior  GI: soft, bsx4 active  Extremities: warm/dry, trace BLE edema, changes consistent with venous stasis  Skin: no rashes or lesions  Assessment & Plan:   Large saddle PE with LLE DVT  -CT with RV / LV of 1.2.   -ECHO with mildly reduced LVEF and no significant RV failure. P: Transition to Eliquis with history of chronic renal insufficiency  No role EKOS  or systemic lytics at this time  Will need life long anticoagulation  Patient asking about compression stockings > reviewed it would likely be fine to consider wearing in the next few months but not in the immediate future given DVT Gentle activity as tolerated  Pt considering returning to work in one week > should be fine for regular activities of daily living with no vigorous physical activity for one month Follow up with Dr. Valeta Harms as arranged 9/2 at 9:45 am [facility] > ordered 8/21 for patient benefit, medications should be delivered to his room 8/21  Hx HTN, HLD P: Continue atorvastatin, eplerenone, diltiazem  Hold losartan with renal insufficiency   Acute on Chronic Renal Insufficiency   P: Follow BMP  Renal adjustment of medications  Follow with allopurinol use  Avoid NSAIDS as able    Best practice:  Diet: Heart healthy diet. Pain/Anxiety/Delirium protocol (if indicated): N/A. VAP protocol (if indicated): N/A. DVT prophylaxis: Heparin gtt with transition to Eliquis (renal insufficiency) GI prophylaxis: N/A. Glucose control: N/A. Mobility: BR with bathroom privileges  Code Status: Full. Family Communication: Patient updated on plan of care 8/21. Disposition:  SDU.   Noe Gens, NP-C [facility] Pgr: [phone] or if no answer [phone] 03/16/2019, 9:41 AM

## 2019-03-17 LAB — BASIC METABOLIC PANEL
Anion gap: 11 (ref 5–15)
BUN: 19 mg/dL (ref 8–23)
CO2: 26 mmol/L (ref 22–32)
Calcium: 8.8 mg/dL — ABNORMAL LOW (ref 8.9–10.3)
Chloride: 103 mmol/L (ref 98–111)
Creatinine, Ser: 1.51 mg/dL — ABNORMAL HIGH (ref 0.61–1.24)
GFR calc Af Amer: 56 mL/min — ABNORMAL LOW (ref >60)
GFR calc non Af Amer: 48 mL/min — ABNORMAL LOW (ref >60)
Glucose, Bld: 90 mg/dL (ref 70–99)
Potassium: 3 mmol/L — ABNORMAL LOW (ref 3.5–5.1)
Sodium: 140 mmol/L (ref 135–145)

## 2019-03-17 LAB — CBC
HCT: 39.3 % (ref 39.0–52.0)
Hemoglobin: 12.6 g/dL — ABNORMAL LOW (ref 13.0–17.0)
MCH: 28.4 pg (ref 26.0–34.0)
MCHC: 32.1 g/dL (ref 30.0–36.0)
MCV: 88.7 fL (ref 80.0–100.0)
Platelets: 185 10*3/uL (ref 150–400)
RBC: 4.43 MIL/uL (ref 4.22–5.81)
RDW: 15.5 % (ref 11.5–15.5)
WBC: 9.9 10*3/uL (ref 4.0–10.5)
nRBC: 0 % (ref 0.0–0.2)

## 2019-03-17 LAB — PROTEIN C, TOTAL: Protein C, Total: 98 % (ref 60–150)

## 2019-03-17 MED ORDER — POTASSIUM CHLORIDE CRYS ER 20 MEQ PO TBCR
40.0000 meq | EXTENDED_RELEASE_TABLET | Freq: Once | ORAL | Status: AC
  Administered 2019-03-17: 40 meq via ORAL
  Filled 2019-03-17: qty 2

## 2019-03-17 NOTE — Progress Notes (Signed)
Patient alert and oriented x4, and denied pain with assess.  Remains independent with ADLs and ambulation.  No distress noted during shift.  Tolerated medications PO and rested greater than 50% of shift.  Stable condition, will continue to monitor.

## 2019-03-17 NOTE — Care Management (Addendum)
Per TOC sticky note DC meds delivered to bedside and given to wife. Patient and wife provided with $10 copay card prior to DC.  No CM needs identified.

## 2019-03-18 LAB — PROTEIN S, TOTAL: Protein S Ag, Total: 121 % (ref 60–150)

## 2019-03-18 LAB — PROTEIN C ACTIVITY: Protein C Activity: 118 % (ref 73–180)

## 2019-03-18 LAB — PROTEIN S ACTIVITY: Protein S Activity: 98 % (ref 63–140)

## 2019-03-19 ENCOUNTER — Telehealth: Payer: Self-pay | Admitting: Pulmonary Disease

## 2019-03-19 LAB — LUPUS ANTICOAGULANT PANEL
DRVVT: 49.5 s — ABNORMAL HIGH (ref 0.0–47.0)
PTT Lupus Anticoagulant: 45.5 s (ref 0.0–51.9)

## 2019-03-19 LAB — DRVVT MIX: dRVVT Mix: 40 s (ref 0.0–47.0)

## 2019-03-19 NOTE — Telephone Encounter (Signed)
ATC Patient.  Left message to call back. 

## 2019-03-19 NOTE — Telephone Encounter (Signed)
Patient states blood pressure is up.  Patient phone number is [phone].

## 2019-03-19 NOTE — Telephone Encounter (Signed)
Patient is returning phone call.  Patient phone number is [phone].

## 2019-03-19 NOTE — Telephone Encounter (Signed)
Left message for patient to call back  

## 2019-03-20 ENCOUNTER — Ambulatory Visit: Payer: PRIVATE HEALTH INSURANCE | Admitting: Pulmonary Disease

## 2019-03-20 NOTE — Telephone Encounter (Signed)
PCCM:  Yes, I am ok with him restarting his BP medication.  Make sure he tells his PCP as well   Garner Nash, DO [facility] 03/20/2019 9:24 AM

## 2019-03-20 NOTE — Telephone Encounter (Signed)
Called and spoke to pt. Informed him of the recs per BI. Pt verbalized understanding and denied any further questions or concerns at this time. Pt's medication list has been updated. Nothing further needed at this time.

## 2019-03-20 NOTE — Telephone Encounter (Signed)
Called and spoke to pt. Pt states he has not been taking his Losartan as instructed at discharge, and told to follow up about his BP at his HFU. However, pt is stating his blood pressure has been ranging from 145-160/90-105 and is questioning if he should start back the Losartan or another BP med. Pt is requesting recs prior to his follow up on 03/28/2019 with Dr. Valeta Harms. Pt denies any new s/s, denies CP/tightness, SOB, swelling.    Dr. Valeta Harms please advise. Thanks.

## 2019-03-21 LAB — FACTOR 5 LEIDEN

## 2019-03-21 LAB — PROTHROMBIN GENE MUTATION

## 2019-03-22 ENCOUNTER — Telehealth: Payer: Self-pay | Admitting: Pulmonary Disease

## 2019-03-22 NOTE — Telephone Encounter (Signed)
Called and spoke to pt. Pt states he saw his PCP and his BP was 120/91 at that time. They are going to check a renal panel and refer to a Hematologist. Pt has a follow up with Dr. Valeta Harms on 03/28/2019.   Will forward to Dr. Valeta Harms as an Juluis Rainier.

## 2019-03-22 NOTE — Telephone Encounter (Signed)
PCCM: Ok great. Thanks for letting me know.  Garner Nash, DO [facility] 03/22/2019 3:17 PM

## 2019-03-22 NOTE — Telephone Encounter (Signed)
LMTCB for pt 

## 2019-03-22 NOTE — Telephone Encounter (Signed)
Patient is returning phone call.  Patient phone number is [phone].

## 2019-03-23 ENCOUNTER — Telehealth: Payer: Self-pay | Admitting: Pulmonary Disease

## 2019-03-23 NOTE — Telephone Encounter (Signed)
Spoke with the pt  He states when [facility] d/c'ed him he was advised that he could advance exercise as tolerated  Then yesterday his PCP told him that he should not be doing anything more than walking around in his house  He had been doing well with walks around his neighborhood before being told this  He is confused about what his restrictions are, if any Please advise thanks

## 2019-03-23 NOTE — Telephone Encounter (Signed)
PCCM:  He can start slow and increase his exercise as tolerated.  This should be fine.   [facility] 03/23/2019 3:07 PM

## 2019-03-23 NOTE — Telephone Encounter (Signed)
PCCM: There should be no restriction to drive as long as he feels comfortable. [facility] 03/23/2019 4:31 PM

## 2019-03-23 NOTE — Telephone Encounter (Signed)
Spoke with the pt and notified of recs per Dr Valeta Harms

## 2019-03-23 NOTE — Telephone Encounter (Signed)
Spoke with the pt and notified of recs   He is now asking if he can drive   Please advise,  thanks

## 2019-03-28 ENCOUNTER — Other Ambulatory Visit: Payer: Self-pay

## 2019-03-28 ENCOUNTER — Encounter: Payer: Self-pay | Admitting: Pulmonary Disease

## 2019-03-28 ENCOUNTER — Ambulatory Visit: Payer: PRIVATE HEALTH INSURANCE | Admitting: Pulmonary Disease

## 2019-03-28 VITALS — BP 114/80 | HR 62 | Temp 97.8°F | Ht 73.0 in | Wt 231.4 lb

## 2019-03-28 DIAGNOSIS — J449 Chronic obstructive pulmonary disease, unspecified: Secondary | ICD-10-CM | POA: Diagnosis not present

## 2019-03-28 DIAGNOSIS — Z7901 Long term (current) use of anticoagulants: Secondary | ICD-10-CM

## 2019-03-28 DIAGNOSIS — R911 Solitary pulmonary nodule: Secondary | ICD-10-CM

## 2019-03-28 DIAGNOSIS — R918 Other nonspecific abnormal finding of lung field: Secondary | ICD-10-CM

## 2019-03-28 DIAGNOSIS — I2609 Other pulmonary embolism with acute cor pulmonale: Secondary | ICD-10-CM | POA: Diagnosis not present

## 2019-03-28 NOTE — Patient Instructions (Addendum)
Thank you for visiting Dr. Valeta Harms at [facility]. Today we recommend the following:  Orders Placed This Encounter  Procedures  . ECHOCARDIOGRAM COMPLETE   Stay on anticoagulation.  Repeat PFTs.  Return in about 6 months (around 09/25/2019), or if symptoms worsen or fail to improve.     Please do your part to reduce the spread of COVID-19.

## 2019-03-28 NOTE — Progress Notes (Signed)
Synopsis: Self referral in November 2019 for SOB.   Subjective:   PATIENT ID: Casey [name], Dr. Theadora Rama: male DOB: 07/16/1956, MRN: [id number]  Chief Complaint  Patient presents with   Hospitalization Follow-up    F/U after ED visit for PE. He reports his breathing actually feels better but he is having increased fatigue.     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 PMH of atrial fibrillation as well as a several week hospitalization for respiratory failure requiring lung biopsy by Dr. Arlyce Dice several years ago.  He was out of work for approximately 3 months.  There was a question of whether or not he developed hantavirus?  After a trip out [place].  He also has a history of a schwannoma removal from an intercostal nerve.  He has always had a elevated left hemidiaphragm. Father with history of DVT X 2. He was on xarelto in the past. He traveled to [place] recently. He felt some right calf pain, no swelling or warmth. The past persisted for the past two weeks. D-dimer was positive 3, US duplex with superficial thrombophebitis.  The patient is a Stage manager.  He does play tennis once a week.  He does feel that while he is playing tennis he gets more short of breath on occasion.  He has noticed some limitation in his physical activity when he significantly exerts himself.  He mainly just wants to make sure there is not anything wrong with his lungs that may cause him to be short of breath.  He did have a recent chest x-ray that looked normal as well as a VQ scan that was completed in the ED due to his elevation of his serum creatinine that was low probability.  He denies chest pain or palpitations.  He does state he knows when he is in atrial fibrillation.  He is very sensitive to to knowing when he is in atrial fibrillation and has had ablation in the past.  He is a lifelong non-smoker with evidence of rather obstructed lung disease.  He has had significant injury to the lung  in the past before due to a questionable viral/aspiration pneumonia type event that he ended up in the hospital requiring a long stay and ultimately underwent VATS.  One must consider a post inflammatory type small airway disease such as BO but he has no other symptoms.  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  OV 08/31/2018: Since last office visit patient was unfortunately mid to the hospital for hypoxemic episode as well as a lower lobe pneumonia with small infiltrate.  He was treated with antibiotics steroids and discharged.  Since that he has been last seen in the office he is also had cardiac evaluation to include left heart catheterization.  Patient had pulmonary function tests completed prior to office visit today which reveals a ratio of 68 and a FEV1 of 55% predicted.  Consistent with moderate obstructive disease.  Overall since his hospitalization he has been improving slowly.  Occasionally does feel that he has chest tightness.  He still occasionally has wheeze.  He does feel that he was breathing much better using a nebulizer in the hospital and would like to have one at home.  OV 03/28/2019: Since last office visit patient was admitted to the hospital for submassive PE.  Patient was in the at [facility]  operating room developed abrupt onset of lightheadedness crash down and ultimately synced to the floor and about to perform a tonsillectomy.  He had a funny sensation in his chest and was taken to the emergency room.  CTA revealed submassive PE.  He was started on heparin.  Bilateral lower lobe pulmonary branches involved with a RV/LV ratio of 1.2.  Patient had multiple hematologic studies completed during hospitalization.  Patient was discharged on Eliquis.  Today, is able to recall his whole encounter. His father had a DVT after a hip replacement and travel history from [place] develop  after travel.  Patient at this time still feeling fatigued with significant exertion.  He has been walking around the neighborhood with his wife able to do up to 3 miles at a time.    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HCC)    ongoing evaluation for elevated R>L adosterone secretion   Arthritis    Atrial enlargement, bilateral    Atrial fibrillation (HCC)    paroxysmal   Atrial flutter (HCC)    Complication of anesthesia    could not urinate after UHR, needed cath, mental fog sinc cervical fusion 08-09-16    Dysrhythmia    a-fib-had ablation x2   GERD (gastroesophageal reflux disease)    Hyperaldosteronism (HCC)    Hypertension    Kidney cysts    "multiple"   Nephrolithiasis    Pneumonia 1990's   Pre-diabetes    Prostatitis    Renal insufficiency    "Cr 1.6"     Family History  Problem Relation Age of Onset   Atrial fibrillation Other        sister has had 2 afib ablations   Atrial fibrillation Sister      Past Surgical History:  Procedure Laterality Date   ABLATION     ATRIAL FIBRILLATION ABLATION N/A 09/28/2012   Procedure: ATRIAL FIBRILLATION ABLATION;  Surgeon: Thompson Grayer, MD;  Location: [facility];  Service: Cardiovascular;  Laterality: N/A;   atrial fibrillation ablation with CTI ablation  05/27/11, 09/28/12   ablation for afib and atrial flutter by Dr Norman Herrlich SURGERY  08/09/2016   C 5 6  C 6 to C 7 cervical fusion   CARDIOVERSION  07/28/2011   Procedure: CARDIOVERSION;  Surgeon: Lelon Perla, MD;  Location: [facility];  Service: Cardiovascular;  Laterality: N/A;   COLONOSCOPY WITH PROPOFOL N/A 03/03/2015   Procedure: COLONOSCOPY WITH PROPOFOL;  Surgeon: Garlan Fair, MD;  Location: [facility];  Service: Gastroenterology;  Laterality: N/A;   double j stent placement  06/2004   left   ESOPHAGOGASTRODUODENOSCOPY (EGD) WITH PROPOFOL N/A 03/03/2015   Procedure: ESOPHAGOGASTRODUODENOSCOPY (EGD) WITH  PROPOFOL;  Surgeon: Garlan Fair, MD;  Location: [facility];  Service: Gastroenterology;  Laterality: N/A;   ESOPHAGOGASTRODUODENOSCOPY (EGD) WITH PROPOFOL N/A 10/25/2016   Procedure: ESOPHAGOGASTRODUODENOSCOPY (EGD) WITH PROPOFOL;  Surgeon: Garlan Fair, MD;  Location: [facility];  Service: Endoscopy;  Laterality: N/A;   INGUINAL HERNIA REPAIR     "as a child; ? right"   INGUINAL HERNIA REPAIR Left 06/23/2016   Procedure: OPEN LEFT INGUINAL HERNIA  REPAIR WITH MESH;  Surgeon: Johnathan Hausen, MD;  Location: [facility];  Service: General;  Laterality: Left;   INSERTION OF MESH Left 06/23/2016   Procedure: INSERTION OF MESH;  Surgeon: Johnathan Hausen, MD;  Location: [facility];  Service: General;  Laterality: Left;   LEFT HEART CATH AND  CORONARY ANGIOGRAPHY N/A 07/25/2018   Procedure: LEFT HEART CATH AND CORONARY ANGIOGRAPHY;  Surgeon: Belva Crome, MD;  Location: [facility];  Service: Cardiovascular;  Laterality: N/A;   LUNG BIOPSY  1993   NASAL SEPTOPLASTY W/ TURBINOPLASTY  07/2006   prostate biopsy  2011   schwannoma removal  ~01/2010   left thorax   TEE WITHOUT CARDIOVERSION N/A 09/27/2012   Procedure: TRANSESOPHAGEAL ECHOCARDIOGRAM (TEE);  Surgeon: Larey Dresser, MD;  Location: [facility];  Service: Cardiovascular;  Laterality: N/A;   ULTRASOUND GUIDANCE FOR VASCULAR ACCESS  07/25/2018   Procedure: Ultrasound Guidance For Vascular Access;  Surgeon: Belva Crome, MD;  Location: [facility];  Service: Cardiovascular;;   UMBILICAL HERNIA REPAIR  07/2006    Social History   Socioeconomic History   Marital status: Married    Spouse name: Not on file   Number of children: Not on file   Years of education: Not on file   Highest education level: Not on file  Occupational History   Not on file  Social Needs   Financial resource strain: Not on file   Food insecurity    Worry: Not on file    Inability: Not on  file   Transportation needs    Medical: Not on file    Non-medical: Not on file  Tobacco Use   Smoking status: Never Smoker   Smokeless tobacco: Never Used  Substance and Sexual Activity   Alcohol use: Yes    Alcohol/week: 2.0 standard drinks    Types: 2 Shots of liquor per week    Comment: occasional, 05-11-2016 Per pt 3 drinks per wk   Drug use: No    Comment: 05-11-2016 per pt no   Sexual activity: Yes  Lifestyle   Physical activity    Days per week: 7 days    Minutes per session: 40 min   Stress: Not at all  Relationships   Social connections    Talks on phone: Patient refused    Gets together: Patient refused    Attends religious service: Patient refused    Active member of club or organization: Patient refused    Attends meetings of clubs or organizations: Patient refused    Relationship status: Married   Intimate partner violence    Fear of current or ex partner: No    Emotionally abused: No    Physically abused: No    Forced sexual activity: No  Other Topics Concern   Not on file  Social History Narrative   ** Merged History Encounter **       ENT physician in [place].     Allergies  Allergen Reactions   Aldactone [Spironolactone]     Breast tissue enlargement   Codeine Other (See Comments)    Headache    Lisinopril Cough   Nexium [Esomeprazole Magnesium] Diarrhea   Sulfa Antibiotics Rash   Sulfa Drugs Cross Reactors Rash     Outpatient Medications Prior to Visit  Medication Sig Dispense Refill   albuterol (PROVENTIL HFA;VENTOLIN HFA) 108 (90 Base) MCG/ACT inhaler Inhale 2 puffs into the lungs every 6 (six) hours as needed for wheezing or shortness of breath. 1 Inhaler 2   albuterol (PROVENTIL) (2.5 MG/3ML) 0.083% nebulizer solution Take 3 mLs (2.5 mg total) by nebulization every 4 (four) hours as needed for wheezing or shortness of breath. 75 mL 1   allopurinol (ZYLOPRIM) 100 MG tablet Take 100 mg by mouth 2 (two) times daily.  Ascorbic Acid (VITAMIN C) 1000 MG tablet Take 1,000 mg by mouth daily.     atorvastatin (LIPITOR) 10 MG tablet Take 10 mg by mouth every evening.      CIALIS 20 MG tablet Take 20 mg by mouth daily as needed for erectile dysfunction.   11   diltiazem (CARDIZEM CD) 360 MG 24 hr capsule Take 360 mg by mouth every evening.  7   Eliquis DVT/PE Starter Pack (ELIQUIS STARTER PACK) 5 MG TABS Take as directed on package: start with two-5mg  tablets twice daily for 7 days. On day 8, switch to one-5mg  tablet twice daily. 1 each 0   eplerenone (INSPRA) 50 MG tablet Take 50 mg by mouth 2 (two) times daily.     Fluticasone-Umeclidin-Vilant (TRELEGY ELLIPTA) 100-62.5-25 MCG/INH AEPB Inhale 1 puff into the lungs daily. 1 each 3   hydrochlorothiazide (MICROZIDE) 12.5 MG capsule Take 12.5 mg by mouth daily.     loperamide (IMODIUM A-D) 2 MG tablet Take 2 mg by mouth 2 (two) times daily.     losartan (COZAAR) 100 MG tablet Take 100 mg by mouth daily.     Multiple Vitamins-Minerals (ZINC PO) Take 1 tablet by mouth 2 (two) times daily.     omeprazole (PRILOSEC) 40 MG capsule Take 40 mg by mouth daily before supper.      oxybutynin (DITROPAN-XL) 10 MG 24 hr tablet Take 10 mg by mouth daily.     Potassium 99 MG TABS Take 198 mg by mouth 2 (two) times daily.     sertraline (ZOLOFT) 50 MG tablet Take 1 tablet (50 mg total) by mouth every evening. (Patient taking differently: Take 100 mg by mouth every evening. 100mg  daily) 90 tablet 1   vitamin B-12 (CYANOCOBALAMIN) 1000 MCG tablet Take 1,000 mcg by mouth daily.     No facility-administered medications prior to visit.     Review of Systems  Constitutional: Positive for malaise/fatigue. Negative for chills, fever and weight loss.  HENT: Negative for hearing loss, sore throat and tinnitus.   Eyes: Negative for blurred vision and double vision.  Respiratory: Positive for shortness of breath. Negative for cough, hemoptysis, sputum production, wheezing  and stridor.   Cardiovascular: Negative for chest pain, palpitations, orthopnea, leg swelling and PND.  Gastrointestinal: Negative for abdominal pain, constipation, diarrhea, heartburn, nausea and vomiting.  Genitourinary: Negative for dysuria, hematuria and urgency.  Musculoskeletal: Negative for joint pain and myalgias.  Skin: Negative for itching and rash.  Neurological: Negative for dizziness, tingling, weakness and headaches.  Endo/Heme/Allergies: Negative for environmental allergies. Does not bruise/bleed easily.  Psychiatric/Behavioral: Negative for depression. The patient is not nervous/anxious and does not have insomnia.   All other systems reviewed and are negative.    Objective:  Physical Exam Vitals signs reviewed.  Constitutional:      General: He is not in acute distress.    Appearance: He is well-developed.  HENT:     Head: Normocephalic and atraumatic.  Eyes:     General: No scleral icterus.    Conjunctiva/sclera: Conjunctivae normal.     Pupils: Pupils are equal, round, and reactive to light.  Neck:     Musculoskeletal: Neck supple.     Vascular: No JVD.     Trachea: No tracheal deviation.  Cardiovascular:     Rate and Rhythm: Normal rate and regular rhythm.     Heart sounds: Normal heart sounds. No murmur.  Pulmonary:     Effort: Pulmonary effort is normal. No tachypnea, accessory muscle  usage or respiratory distress.     Breath sounds: Normal breath sounds. No stridor. No wheezing, rhonchi or rales.  Abdominal:     General: Bowel sounds are normal. There is no distension.     Palpations: Abdomen is soft.     Tenderness: There is no abdominal tenderness.  Musculoskeletal:        General: No tenderness.  Lymphadenopathy:     Cervical: No cervical adenopathy.  Skin:    General: Skin is warm and dry.     Capillary Refill: Capillary refill takes less than 2 seconds.     Findings: No rash.     Comments: Evidence of bilateral lower extremity venous stasis.    Neurological:     Mental Status: He is alert and oriented to person, place, and time.  Psychiatric:        Behavior: Behavior normal.      Vitals:   03/28/19 0941  BP: 114/80  Pulse: 62  Temp: 97.8 F (36.6 C)  TempSrc: Temporal  SpO2: 98%  Weight: 231 lb 6.4 oz (105 kg)  Height: 6\' 1"  (1.854 m)   98% on  RA BMI Readings from Last 3 Encounters:  03/28/19 30.53 kg/m  03/15/19 31.03 kg/m  08/31/18 30.30 kg/m   Wt Readings from Last 3 Encounters:  03/28/19 231 lb 6.4 oz (105 kg)  03/14/19 235 lb 3.7 oz (106.7 kg)  08/31/18 236 lb (107 kg)     CBC    Component Value Date/Time   WBC 9.9 03/17/2019 0415   RBC 4.43 03/17/2019 0415   HGB 12.6 (L) 03/17/2019 0415   HGB 13.8 07/20/2018 1313   HCT 39.3 03/17/2019 0415   HCT 42.0 07/20/2018 1313   PLT 185 03/17/2019 0415   PLT 269 07/20/2018 1313   MCV 88.7 03/17/2019 0415   MCV 87 07/20/2018 1313   MCH 28.4 03/17/2019 0415   MCHC 32.1 03/17/2019 0415   RDW 15.5 03/17/2019 0415   RDW 15.2 07/20/2018 1313   LYMPHSABS 1.2 03/14/2019 0934   MONOABS 0.7 03/14/2019 0934   EOSABS 0.2 03/14/2019 0934   BASOSABS 0.1 03/14/2019 0934   Hematologic labs: All completed 03/15/2019:  Antithrombin III, protein C, protein S: Negative Lupus anticoagulant panel: DRV VT mildly elevated 49.5 Beta-2 glycoprotein: Negative Homocystine: Normal level Factor V Leiden: Negative for mutation Prothrombin gene mutation: Negative Cardiolipin antibodies: Negative Separate DRVVT mixed study: Within normal limits  Chest Imaging:  05/31/2018 ventilation perfusion scan: Low probability 05/31/2018 chest x-ray: No acute abnormalities  10/25/2018: High-resolution CT chest No evidence of interstitial lung disease resolution of right lung base opacity seen previously, left upper lobe lung nodule The patient's images have been independently reviewed by me.    03/14/2019: CTA chest Bilateral lower lobe acute pulmonary emboli with evidence of  right ventricular strain RV LV ratio 1.2. Left upper lobe 5 mm sub-solid pulmonary nodule with groundglass recommending 62-month follow-up. The patient's images have been independently reviewed by me.    Pulmonary Functions Testing Results: PFT Results Latest Ref Rng & Units 08/29/2018  FVC-Pre L 3.38  FVC-Predicted Pre % 63  FVC-Post L 3.27  FVC-Predicted Post % 61  Pre FEV1/FVC % % 67  Post FEV1/FCV % % 68  FEV1-Pre L 2.28  FEV1-Predicted Pre % 56  FEV1-Post L 2.22  DLCO UNC% % 81  DLCO COR %Predicted % 109  TLC L 6.63  TLC % Predicted % 84  RV % Predicted % 113    FeNO: None  Pathology: None   Echocardiogram:  09/27/2012 TEE: Normal ejection fraction 55 to 60% no PFO no LV thrombus LA thrombus 07/18/2018 transthoracic echo: Normal ejection fraction 50 to 55%  Heart Catheterization:   07/25/2018:  Left dominant coronary anatomy  Normal left main  Normal LAD  Normal circumflex  Nondominant normal right coronary  Normal LVEDP, 16 mmHg.  Echocardiogram done several days ago documented EF 55%.  Abnormal EKG does not reflect prior infarction/ischemic injury to the heart. RECOMMENDATIONS:  No further ischemic evaluation  Current EKG will be the patient's EKG pattern going forward and has no ischemic basis.  Resume Xarelto 07/26/2018.    Assessment & Plan:   Other acute pulmonary embolism with acute cor pulmonale (HCC) - Plan: ECHOCARDIOGRAM COMPLETE  Abnormal findings on diagnostic imaging of lung - Plan: ECHOCARDIOGRAM COMPLETE  Stage 2 moderate COPD by GOLD classification ([place]) - Plan: ECHOCARDIOGRAM COMPLETE, Pulmonary Function Test  On continuous oral anticoagulation - Plan: ECHOCARDIOGRAM COMPLETE  Lung nodule  Discussion:  63 year old gentleman, lifelong non-smoker, occasional cigar use randomly throughout the years.  Diagnosed with moderate obstructive pulmonary disease FEV1 56% predicted, ratio of 68.  Placed on Trelegy.  Doing well with this.  Left  upper lobe pulmonary nodule with evidence of groundglass surrounding.  Plans for follow-up in 3 months from now.  Prior lower lobe infiltrate that was being followed has resolved.  As for his recent acute bilateral pulmonary emboli he can should continue his Eliquis. We also discussed risk benefits and alternatives of lifelong anticoagulation.  Due to his family history of DVT as well as his initial presenting VTE as potential life-threatening as an intermediate submassive the decision was made to continue lifelong anticoagulation.  I think this is the best approach.  Continue Trelegy inhaler. Continue exercise and increased physical activity.  Repeat PFTs in 6 months Follow-up in clinic in 6 months.  Greater than 50% of this patient's 25-minute of visit was been face-to-face discussing above recommendations treatment plan as well as review of CT imaging from recent hospitalization.   Current Outpatient Medications:    albuterol (PROVENTIL HFA;VENTOLIN HFA) 108 (90 Base) MCG/ACT inhaler, Inhale 2 puffs into the lungs every 6 (six) hours as needed for wheezing or shortness of breath., Disp: 1 Inhaler, Rfl: 2   albuterol (PROVENTIL) (2.5 MG/3ML) 0.083% nebulizer solution, Take 3 mLs (2.5 mg total) by nebulization every 4 (four) hours as needed for wheezing or shortness of breath., Disp: 75 mL, Rfl: 1   allopurinol (ZYLOPRIM) 100 MG tablet, Take 100 mg by mouth 2 (two) times daily. , Disp: , Rfl:    Ascorbic Acid (VITAMIN C) 1000 MG tablet, Take 1,000 mg by mouth daily., Disp: , Rfl:    atorvastatin (LIPITOR) 10 MG tablet, Take 10 mg by mouth every evening. , Disp: , Rfl:    CIALIS 20 MG tablet, Take 20 mg by mouth daily as needed for erectile dysfunction. , Disp: , Rfl: 11   diltiazem (CARDIZEM CD) 360 MG 24 hr capsule, Take 360 mg by mouth every evening., Disp: , Rfl: 7   Eliquis DVT/PE Starter Pack (ELIQUIS STARTER PACK) 5 MG TABS, Take as directed on package: start with two-5mg   tablets twice daily for 7 days. On day 8, switch to one-5mg  tablet twice daily., Disp: 1 each, Rfl: 0   eplerenone (INSPRA) 50 MG tablet, Take 50 mg by mouth 2 (two) times daily., Disp: , Rfl:    Fluticasone-Umeclidin-Vilant (TRELEGY ELLIPTA) 100-62.5-25 MCG/INH AEPB, Inhale 1 puff into the  lungs daily., Disp: 1 each, Rfl: 3   hydrochlorothiazide (MICROZIDE) 12.5 MG capsule, Take 12.5 mg by mouth daily., Disp: , Rfl:    loperamide (IMODIUM A-D) 2 MG tablet, Take 2 mg by mouth 2 (two) times daily., Disp: , Rfl:    losartan (COZAAR) 100 MG tablet, Take 100 mg by mouth daily., Disp: , Rfl:    Multiple Vitamins-Minerals (ZINC PO), Take 1 tablet by mouth 2 (two) times daily., Disp: , Rfl:    omeprazole (PRILOSEC) 40 MG capsule, Take 40 mg by mouth daily before supper. , Disp: , Rfl:    oxybutynin (DITROPAN-XL) 10 MG 24 hr tablet, Take 10 mg by mouth daily., Disp: , Rfl:    Potassium 99 MG TABS, Take 198 mg by mouth 2 (two) times daily., Disp: , Rfl:    sertraline (ZOLOFT) 50 MG tablet, Take 1 tablet (50 mg total) by mouth every evening. (Patient taking differently: Take 100 mg by mouth every evening. 100mg  daily), Disp: 90 tablet, Rfl: 1   vitamin B-12 (CYANOCOBALAMIN) 1000 MCG tablet, Take 1,000 mcg by mouth daily., Disp: , Rfl:    Garner Nash, DO  Pulmonary Critical Care 03/28/2019 9:45 AM

## 2019-03-29 ENCOUNTER — Other Ambulatory Visit: Payer: Self-pay

## 2019-03-29 DIAGNOSIS — Z20822 Contact with and (suspected) exposure to covid-19: Secondary | ICD-10-CM

## 2019-03-30 LAB — NOVEL CORONAVIRUS, NAA: SARS-CoV-2, NAA: NOT DETECTED

## 2019-04-03 ENCOUNTER — Telehealth: Payer: Self-pay | Admitting: Pulmonary Disease

## 2019-04-03 ENCOUNTER — Encounter (HOSPITAL_COMMUNITY): Payer: Self-pay | Admitting: Pulmonary Disease

## 2019-04-03 DIAGNOSIS — R0609 Other forms of dyspnea: Secondary | ICD-10-CM

## 2019-04-03 NOTE — Telephone Encounter (Signed)
Call returned to patient, I made this patient aware that I do no see a telephone encounter or recent labs or imagine. I made him aware to disregard for now and if we needed him we would give him a call back. Voiced understanding. Nothing further needed at this time.

## 2019-04-03 NOTE — Telephone Encounter (Signed)
PCCM:  Tried to call Dr. [name] to discuss referral to Dr. Lawernce Ion at [facility].   I have placed the referral for this.  I will try to reach out and speak with Dr. [name] later this week.  If he calls back please obtain a time that I can reach him.  Garner Nash, DO [place] [facility] 04/03/2019 5:28 PM

## 2019-04-03 NOTE — Telephone Encounter (Signed)
ATC pt, no answer. Left message for pt to call back.  

## 2019-04-04 NOTE — Telephone Encounter (Signed)
Called and spoke with pt. Pt states he needs a pulmonary referral to Dr. Lawernce Ion with [facility]. I let him know I would route a message to [facility], as he is in the office today.   BI, please advise if you would be ok with placing a pulmonary referral order for pt as requested. Thank you.

## 2019-04-04 NOTE — Telephone Encounter (Signed)
PCCM:  Referral has been placed.  Thanks  BLI   Garner Nash, DO [place] [facility] 04/04/2019 2:09 PM

## 2019-04-05 NOTE — Telephone Encounter (Signed)
Spoke with pt. He is aware that this referral has been taken care of. Nothing further was needed at this time.

## 2019-04-05 NOTE — Telephone Encounter (Signed)
Patient has been contacted. Nothing further needed at this time.

## 2019-04-09 ENCOUNTER — Telehealth: Payer: Self-pay | Admitting: Pulmonary Disease

## 2019-04-09 NOTE — Telephone Encounter (Signed)
Attempted to call the patient, no answer, left message to call back.

## 2019-04-11 MED ORDER — APIXABAN 5 MG PO TABS: 5.0000 mg | ORAL_TABLET | Freq: Two times a day (BID) | ORAL | 3 refills | Status: AC

## 2019-04-11 NOTE — Telephone Encounter (Signed)
Spoke with pt. He would like to know if it is safe for him to ride a bike and to have intercourse while taking Eliquis. While speaking to the pt, he stated that he needs a refill on Eliquis. This prescription has been sent in.  Dr. Valeta Harms - please advise. Thanks!

## 2019-04-11 NOTE — Telephone Encounter (Signed)
PCCM:  Yes, riding and bike and intercourse should be safe.  The biggest risks are for trauma related events.  Mountain biking could be considered a high risk or potential for injury.   [place] [facility] 04/11/2019 5:18 PM

## 2019-04-11 NOTE — Telephone Encounter (Signed)
Call returned to patient, made aware of recommendations per BI. Voiced understanding. Nothing further needed at this time.

## 2019-04-17 ENCOUNTER — Other Ambulatory Visit: Payer: Self-pay

## 2019-04-17 MED ORDER — TRELEGY ELLIPTA 100-62.5-25 MCG/INH IN AEPB: 1.0000 | INHALATION_SPRAY | Freq: Every day | RESPIRATORY_TRACT | 3 refills | Status: AC

## 2019-04-19 ENCOUNTER — Encounter: Payer: PRIVATE HEALTH INSURANCE | Admitting: Hematology

## 2019-04-29 NOTE — Progress Notes (Signed)
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 Pt is accompanied today by his wife Casey [name], via phone. The pt reports that he is doing well overall.  The pt reports that he experienced his DVT/PE on 03/14/2019. A few days before this he was on a four-hour car ride home from vacation where he was able to take multiple breaks to stretch his legs. He does note that he had pain in his left leg during this time which continued for the next few days. During their vacation they walked on the beach and rode their bikes but did not do any physical activity out of the ordinary. 2-3 days later he went to sign up for an elective tonsillectomy and began to feel like he was going to faint. He was rushed to the hospital and they found the DVT/PE. He reports multiple respiratory illnesses in the past few years but had no other chest pains or tightness before this event.   Pt had a Superficial Thrombophlebitis clot last year after a long flight to [place]. He was on anti-coagulation for 6 months after that. He also took Coumadin a couple of years ago for intermittent Atrial Fibrillation. He has no issues with Afib in years.   Pt had Pneumonia in January. He had a fever, was treated with inhalers and steroids and needed to be hospitalized. He reports that they never cultured anything from his infection and does not remember being given antibiotics. He is still unsure if it was a bacterial Pneumonia. Pt prefers  Trelegy to Albuterol but cannot fully attribute his improved breathing to Trelegy. His breathing could simply have improved with time. He was not placed on high-dose steroids to treat. Pt had another respiratory infection previously where he was in the hospital 3 weeks and was never given a diagnosis. There was concern for Hanta Virus. He did not have to intubated at any point during this hospitalization.   Pt is currently on anti-coagulation therapy. All of the testing that was done in the hospital was in the setting of the pt being on Heparin. He does report some pain/soreness in his left leg. He is on a low-dose of Potassium replacement and eats a banana everyday. Pt elevates his leg at night as he can't wear his compression socks all day and does not note that his calf pain is worse at the end of the day. He has no history of Varicose veins but does get some pedal edema when standing for long periods of time and some lower leg discoloration with Veinous Stasis. 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 Pt has uric acid kidney stones and is taking Allopurinol preventatively. He reports that he had his first kidney stone when he was a resident but hasn't had problems since. There have been mulitple cysts found on his kidney, for which he has had genetic testing done in [place]. Pt has acid reflux and according  to his GI, concern for slow gastric emptying. Pt has had some prostatic changes (potential chronic prostatitis) and some issues with complete bowel emptying which has been managed with Miralax. No definitive answer on the cause of his previous bladder & bowel incontinence. Pt gets regular colonoscopies, which have revealed occasional benign polyps. He had one last year, which revealed no significant findings and has been advised to repeat in 10 years. He has  recently been experiencing some hard stool since beginning Imodium, where his stool used to have a more runny consistency. Pt has been wearing compression socks on long journeys or flights and is now wearing medical-grade compression socks. Pt notes minimal hand arthritis but no other joint swelling or issues. He says that he has been sleeping more in the last 6 months, due to his wife's encouragement and feels that it has helped his energy levels slighty. Pt reports some kyphosis. He also plays the tuba and states that he does not have any issues getting a full breath while playing. His breathing has improved recently, he feels better than he has in awhile. His wife notes that in the last few years pt becomes winded and SOB more easily. She states that pt was able to ride 30 miles on his bike, hike and walk long distances with no issues. Pt has never been on Testosterone replacement and he states that his C-reactive Protein has been high for a long time. Pt has been doing shorter surgeries and is planning on retiring in 6 months.   His father has a history of blood clots as well, with at least 2 reported incidents. His mother had a history of Atrial Fibrillation. Both parents are deceased now. His father was in his 40's when he passed but he had a respiratory-lung disease that was never diagnosed, but Interstitial lung disease was r/o. He began taking oxygen when he was 75. Pt's sister has Lymphedema but does not have Lupus. He has a son and a daughter. His daughter has Fredderick Phenix Danlos Syndrome but did not have any issues with blood clots during 3 pregnancies. He is worried about passing along an unfavorable genetic traits to his children or grandchildren.  Of note prior to the patient's visit today, pt has had [place] Lower Extremity Venous completed on 03/14/2019 with results revealing "Right: No evidence of common femoral vein obstruction. Left: Findings consistent with acute deep vein thrombosis involving the left  femoral vein, left popliteal vein, left posterior tibial veins, and left gastrocnemius veins. No cystic structure found in the popliteal fossa."  Pt has had ECHO completed on 04/30/2019 with results revealing "1. Left ventricular ejection fraction, by visual estimation, is 60 to 65%. The left ventricle has normal function. Normal left ventricular size. There is mildly increased left ventricular hypertrophy. 2. Left ventricular diastolic Doppler parameters are indeterminate pattern of LV diastolic filling. 3. Global right ventricle has normal systolic function.The right ventricular size is mildly enlarged. No increase in right ventricular wall thickness. 4. Left atrial size was mildly dilated. 5. Right atrial size was mildly dilated. 6. Mild aortic valve annular calcification. 7. Mild mitral annular calcification. 8. The mitral valve is normal in structure. No evidence of mitral valve regurgitation. No evidence of mitral stenosis. 9. The tricuspid valve is normal in structure. Tricuspid valve regurgitation is mild. 10. The aortic valve is normal in structure. Aortic valve regurgitation was not visualized by color [name] Doppler. Mild aortic valve sclerosis without stenosis. 11. The pulmonic valve was normal in structure.  Pulmonic valve regurgitation is not visualized by color [name] Doppler. 12. Aortic dilatation noted. 13. There is mild dilatation of the aortic root measuring 42 mm. 14. Mildly elevated pulmonary artery systolic pressure. 15. The inferior vena cava is normal in size with greater than 50% respiratory variability, suggesting right atrial pressure of 3 mmHg."   Most recent lab results (03/17/2019) of CBC & BMP is as follows: WBC at 9.9K, RBC at 4.43, Hgb at 12.6, HCT at 39.3, MCV at 88.7, MCH at 28.4, MCHC at 32.1, RDW at 15.5, PLTs at 185K, nRBC at 0.0, Sodium at 140, Potassium at 3.0, Chloride at 103, CO2 at 26, Glucose at 90, BUN at 19, Creatinine at 1.51, Calcium at 8.8, GFR Est Non Af Am at 48,  Anion Gap at 11.  03/15/2019 dRVVT Mix at 40.0  On review of systems, pt reports improved breathing, constipation, left calf pain and denies issues swallowing, fevers, chills, night sweats, headaches, changes in vision, abdominal pain, diarrhea and any other symptoms.   On PMHx the pt reports Atrial Fibrillation, Pneumonia, PE, DVT, Kidney cysts , Kidney stones. On Family Hx the pt reports that his father had a history of blood clots (treated by Coumadin), his mother had Atrial Fibrillation and Polycystic kidneys and his sister has Lymphedema.   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[facility])    ongoing evaluation for elevated R>L adosterone secretion   Arthritis    Atrial enlargement, bilateral    Atrial fibrillation (HCC)    paroxysmal   Atrial flutter (HCC)    Complication of anesthesia    could not urinate after UHR, needed cath, mental fog sinc cervical fusion 08-09-16    Dysrhythmia    a-fib-had ablation x2   GERD (gastroesophageal reflux disease)    Hyperaldosteronism (HCC)    Hypertension    Kidney cysts    "multiple"   Nephrolithiasis    Pneumonia 1990's   Pre-diabetes    Prostatitis    Renal insufficiency    "Cr 1.6"    SURGICAL HISTORY: Past Surgical History:  Procedure Laterality Date   ABLATION     ATRIAL FIBRILLATION ABLATION N/A 09/28/2012   Procedure: ATRIAL FIBRILLATION ABLATION;  Surgeon: Thompson Grayer, MD;  Location: [facility] CATH LAB;  Service: Cardiovascular;  Laterality: N/A;   atrial fibrillation ablation with CTI ablation  05/27/11, 09/28/12   ablation for afib and atrial flutter by Dr Norman Herrlich SURGERY  08/09/2016   C 5 6  C 6 to C 7 cervical fusion   CARDIOVERSION  07/28/2011   Procedure: CARDIOVERSION;  Surgeon: Lelon Perla, MD;  Location: [facility] [facility];  Service: Cardiovascular;  Laterality: N/A;   COLONOSCOPY WITH PROPOFOL N/A 03/03/2015   Procedure: COLONOSCOPY WITH PROPOFOL;  Surgeon:  Garlan Fair, MD;  Location: [facility];  Service: Gastroenterology;  Laterality: N/A;   double j stent placement  06/2004   left   ESOPHAGOGASTRODUODENOSCOPY (EGD) WITH PROPOFOL N/A 03/03/2015   Procedure: ESOPHAGOGASTRODUODENOSCOPY (EGD) WITH PROPOFOL;  Surgeon: Garlan Fair, MD;  Location: [facility]OS[facility];  Service: Gastroenterology;  Laterality: N/A;   ESOPHAGOGASTRODUODENOSCOPY (EGD) WITH PROPOFOL N/A 10/25/2016   Procedure: ESOPHAGOGASTRODUODENOSCOPY (EGD) WITH PROPOFOL;  Surgeon: Garlan Fair, MD;  Location: [facility]OS[facility];  Service: Endoscopy;  Laterality: N/A;   INGUINAL HERNIA REPAIR     "as a child; ? right"   INGUINAL HERNIA REPAIR Left 06/23/2016   Procedure: OPEN LEFT INGUINAL HERNIA  REPAIR WITH MESH;  Surgeon: Johnathan Hausen, MD;  Location: [facility];  Service: General;  Laterality: Left;   INSERTION OF MESH Left 06/23/2016   Procedure: INSERTION OF MESH;  Surgeon: Johnathan Hausen, MD;  Location: [facility];  Service: General;  Laterality: Left;   LEFT HEART CATH AND CORONARY ANGIOGRAPHY N/A 07/25/2018   Procedure: LEFT HEART CATH AND CORONARY ANGIOGRAPHY;  Surgeon: Belva Crome, MD;  Location: [facility];  Service: Cardiovascular;  Laterality: N/A;   LUNG BIOPSY  1993   NASAL SEPTOPLASTY W/ TURBINOPLASTY  07/2006   prostate biopsy  2011   schwannoma removal  ~01/2010   left thorax   TEE WITHOUT CARDIOVERSION N/A 09/27/2012   Procedure: TRANSESOPHAGEAL ECHOCARDIOGRAM (TEE);  Surgeon: Larey Dresser, MD;  Location: [facility];  Service: Cardiovascular;  Laterality: N/A;   ULTRASOUND GUIDANCE FOR VASCULAR ACCESS  07/25/2018   Procedure: Ultrasound Guidance For Vascular Access;  Surgeon: Belva Crome, MD;  Location: [facility];  Service: Cardiovascular;;   UMBILICAL HERNIA REPAIR  07/2006    SOCIAL HISTORY: Social History   Socioeconomic History   Marital status: Married    Spouse name: Not on file    Number of children: Not on file   Years of education: Not on file   Highest education level: Not on file  Occupational History   Not on file  Social Needs   Financial resource strain: Not on file   Food insecurity    Worry: Not on file    Inability: Not on file   Transportation needs    Medical: Not on file    Non-medical: Not on file  Tobacco Use   Smoking status: Never Smoker   Smokeless tobacco: Never Used  Substance and Sexual Activity   Alcohol use: Yes    Alcohol/week: 2.0 standard drinks    Types: 2 Shots of liquor per week    Comment: occasional, 05-11-2016 Per pt 3 drinks per wk   Drug use: No    Comment: 05-11-2016 per pt no   Sexual activity: Yes  Lifestyle   Physical activity    Days per week: 7 days    Minutes per session: 40 min   Stress: Not at all  Relationships   Social connections    Talks on phone: Patient refused    Gets together: Patient refused    Attends religious service: Patient refused    Active member of club or organization: Patient refused    Attends meetings of clubs or organizations: Patient refused    Relationship status: Married   Intimate partner violence    Fear of current or ex partner: No    Emotionally abused: No    Physically abused: No    Forced sexual activity: No  Other Topics Concern   Not on file  Social History Narrative   ** Merged History Encounter **       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    FAMILY HISTORY: Family History  Problem Relation Age of Onset   Atrial fibrillation Other        sister has had 2 afib ablations   Atrial fibrillation Sister     ALLERGIES:  is allergic to aldactone [spironolactone]; codeine; lisinopril; nexium [esomeprazole magnesium]; sulfa antibiotics; and sulfa drugs cross reactors.  MEDICATIONS:  Current Outpatient Medications  Medication Sig Dispense Refill   albuterol (PROVENTIL HFA;VENTOLIN HFA) 108 (90 Base) MCG/ACT inhaler Inhale 2 puffs into the lungs every 6  (six) hours as needed  for wheezing or shortness of breath. 1 Inhaler 2   albuterol (PROVENTIL) (2.5 MG/3ML) 0.083% nebulizer solution Take 3 mLs (2.5 mg total) by nebulization every 4 (four) hours as needed for wheezing or shortness of breath. 75 mL 1   allopurinol (ZYLOPRIM) 100 MG tablet Take 100 mg by mouth 2 (two) times daily.      apixaban (ELIQUIS) 5 MG TABS tablet Take 1 tablet (5 mg total) by mouth 2 (two) times daily. 180 tablet 3   Ascorbic Acid (VITAMIN C) 1000 MG tablet Take 1,000 mg by mouth daily.     atorvastatin (LIPITOR) 10 MG tablet Take 10 mg by mouth every evening.      CIALIS 20 MG tablet Take 20 mg by mouth daily as needed for erectile dysfunction.   11   diltiazem (CARDIZEM CD) 360 MG 24 hr capsule Take 360 mg by mouth every evening.  7   eplerenone (INSPRA) 50 MG tablet Take 50 mg by mouth 2 (two) times daily.     Fluticasone-Umeclidin-Vilant (TRELEGY ELLIPTA) 100-62.5-25 MCG/INH AEPB Inhale 1 puff into the lungs daily. 28 each 3   hydrochlorothiazide (MICROZIDE) 12.5 MG capsule Take 12.5 mg by mouth daily.     loperamide (IMODIUM A-D) 2 MG tablet Take 2 mg by mouth 2 (two) times daily.     losartan (COZAAR) 100 MG tablet Take 100 mg by mouth daily.     Multiple Vitamins-Minerals (ZINC PO) Take 1 tablet by mouth 2 (two) times daily.     omeprazole (PRILOSEC) 40 MG capsule Take 40 mg by mouth daily before supper.      oxybutynin (DITROPAN-XL) 10 MG 24 hr tablet Take 10 mg by mouth daily.     Potassium 99 MG TABS Take 198 mg by mouth 2 (two) times daily.     sertraline (ZOLOFT) 50 MG tablet Take 1 tablet (50 mg total) by mouth every evening. (Patient taking differently: Take 100 mg by mouth every evening. 100mg  daily) 90 tablet 1   vitamin B-12 (CYANOCOBALAMIN) 1000 MCG tablet Take 1,000 mcg by mouth daily.     No current facility-administered medications for this visit.     REVIEW OF SYSTEMS:    10 Point review of Systems was done is negative except  as noted above.  PHYSICAL EXAMINATION: ECOG PERFORMANCE STATUS: 1 - Symptomatic but completely ambulatory  . Vitals:   04/30/19 1034  BP: 125/80  Pulse: 60  Resp: 17  Temp: 99 F (37.2 C)  SpO2: 95%   Filed Weights   04/30/19 1034  Weight: 233 lb 9.6 oz (106 kg)   .Body mass index is 30.82 kg/m.  GENERAL:alert, in no acute distress and comfortable SKIN: no acute rashes, no significant lesions EYES: conjunctiva are pink and non-injected, sclera anicteric OROPHARYNX: MMM, no exudates, no oropharyngeal erythema or ulceration NECK: supple, no JVD LYMPH:  no palpable lymphadenopathy in the cervical, axillary or inguinal regions LUNGS: clear to auscultation b/l with normal respiratory effort HEART: regular rate & rhythm ABDOMEN:  normoactive bowel sounds , non tender, not distended. Extremity: no pedal edema PSYCH: alert & oriented x 3 with fluent speech NEURO: no focal motor/sensory deficits  LABORATORY DATA:  I have reviewed the data as listed  . CBC Latest Ref Rng & Units 04/30/2019 03/17/2019 03/16/2019  WBC 4.0 - 10.5 K/uL 9.9 9.9 9.4  Hemoglobin 13.0 - 17.0 g/dL 14.4 12.6(L) 12.9(L)  Hematocrit 39.0 - 52.0 % 44.5 39.3 39.4  Platelets 150 - 400 K/uL 250 185 161    .  CMP Latest Ref Rng & Units 04/30/2019 03/17/2019 03/16/2019  Glucose 70 - 99 mg/dL 90 90 105(H)  BUN 8 - 23 mg/dL 31(H) 19 20  Creatinine 0.61 - 1.24 mg/dL 1.58(H) 1.51(H) 1.45(H)  Sodium 135 - 145 mmol/L 141 140 140  Potassium 3.5 - 5.1 mmol/L 3.8 3.0(L) 3.5  Chloride 98 - 111 mmol/L 107 103 104  CO2 22 - 32 mmol/L 24 26 25   Calcium 8.9 - 10.3 mg/dL 9.5 8.8(L) 8.6(L)  Total Protein 6.5 - 8.1 g/dL 7.3 - -  Total Bilirubin 0.3 - 1.2 mg/dL 0.7 - -  Alkaline Phos 38 - 126 U/L 75 - -  AST 15 - 41 U/L 23 - -  ALT 0 - 44 U/L 21 - -   Component     Latest Ref Rng & Units 04/30/2019  IgG (Immunoglobin G), Serum     603 - 1,613 mg/dL 1,174  IgA     61 - 437 mg/dL 286  IgM (Immunoglobulin M), Srm     20  - 172 mg/dL 56  Total Protein ELP     6.0 - 8.5 g/dL 6.8  Albumin SerPl Elph-Mcnc     2.9 - 4.4 g/dL 3.7  Alpha 1     0.0 - 0.4 g/dL 0.2  Alpha2 Glob SerPl Elph-Mcnc     0.4 - 1.0 g/dL 0.8  B-Globulin SerPl Elph-Mcnc     0.7 - 1.3 g/dL 1.0  Gamma Glob SerPl Elph-Mcnc     0.4 - 1.8 g/dL 1.1  M Protein SerPl Elph-Mcnc     Not Observed g/dL Not Observed  Globulin, Total     2.2 - 3.9 g/dL 3.1  Albumin/Glob SerPl     0.7 - 1.7 1.2  IFE 1      Comment  Please Note (HCV):      Comment    03/16/2019 Heparin level    03/15/2019 dRVVT Mix    03/15/2019 Cardiolipin antibodies, IgG, IgM, IgA   03/16/2019 Prothrombin gene mutation   03/15/2019 Factor 5 leiden    03/15/2019 Homocysteine, serum    03/15/2019  Beta-2-glycoprotein I abs, IgG/M/A   03/15/2019 Lupus anticoagulant panel    03/15/2019 Total Protein S    03/15/2019 Protein S activity    03/15/2019 Total Protein C   03/15/2019 Protein C activity    08/0/2020 Antithrombin III   Component     Latest Ref Rng & Units 03/14/2019 03/15/2019 03/16/2019  Heparin Unfractionated     0.30 - 0.70 IU/mL 0.31 0.42 0.28 (L)   Component     Latest Ref Rng & Units 04/30/2019  IgG (Immunoglobin G), Serum     603 - 1,613 mg/dL 1,174  IgA     61 - 437 mg/dL 286  IgM (Immunoglobulin M), Srm     20 - 172 mg/dL 56  Total Protein ELP     6.0 - 8.5 g/dL 6.8  Albumin SerPl Elph-Mcnc     2.9 - 4.4 g/dL 3.7  Alpha 1     0.0 - 0.4 g/dL 0.2  Alpha2 Glob SerPl Elph-Mcnc     0.4 - 1.0 g/dL 0.8  B-Globulin SerPl Elph-Mcnc     0.7 - 1.3 g/dL 1.0  Gamma Glob SerPl Elph-Mcnc     0.4 - 1.8 g/dL 1.1  M Protein SerPl Elph-Mcnc     Not Observed g/dL Not Observed  Globulin, Total     2.2 - 3.9 g/dL 3.1  Albumin/Glob SerPl     0.7 -  1.7 1.2  IFE 1      Comment  Please Note (HCV):      Comment   RADIOGRAPHIC STUDIES: I have personally reviewed the radiological images as listed and agreed with the findings in the  report. No results found.  ASSESSMENT & PLAN:   63 yo male with   1) Unprovoked LLE DVT and submassive Pulmonary embolism 2) Previous h/o Superficial venous thrombosis 2 PLAN: -Discussed patient's most recent labs from 03/17/2019, WBC at 9.9K, RBC at 4.43, Hgb at 12.6, HCT at 39.3, MCV at 88.7, MCH at 28.4, MCHC at 32.1, RDW at 15.5, PLTs at 185K, nRBC at 0.0, Sodium at 140, Potassium at 3.0, Chloride at 103, CO2 at 26, Glucose at 90, BUN at 19, Creatinine at 1.51, Calcium at 8.8, GFR Est Non Af Am at 48, Anion Gap at 11.  -Discussed 03/15/2019 dRVVT Mix at 40.0 -Discussed 03/14/2019 [place] Lower Extremity Venous which revealed "Right: No evidence of common femoral vein obstruction. Left: Findings consistent with acute deep vein thrombosis involving the left femoral vein, left popliteal vein, left posterior tibial veins, and left gastrocnemius veins. No cystic structure found in the popliteal fossa." -Discussed 04/30/2019 ECHO which revealed "1. Left ventricular ejection fraction, by visual estimation, is 60 to 65%. The left ventricle has normal function. Normal left ventricular size. There is mildly increased left ventricular hypertrophy. 2. Left ventricular diastolic Doppler parameters are indeterminate pattern of LV diastolic filling. 3. Global right ventricle has normal systolic function.The right ventricular size is mildly enlarged. No increase in right ventricular wall thickness.  -Advised pt that there is no clear, provoking event  -Recommend the continued use of medical-grade compression socks  -Recommend long-term blood thinners given this was a significant and unprovoked event with a 10-15% risk of recurrent VTE per year. -Continue to avoid NSAIDs   -Advised pt to wait 6 months for any elective surgery  -Discussed that we typically repeat D-dimer if there are concerns of anti-coagulation not working, or if someone is looking to stop anti-coagulation medication -Discussed risk vs. benefit  of additional scans -Repeat US Lower Extremity Venous in four months  -Will get labs today to complete hypercoagulability w/u. Workup thus far with no evident notable hypercoagulable state. -Will see back 4 months    FOLLOW UP: Labs today [place] extremities in 16 weeks RTC with Dr Irene Limbo in 4 months  All of the patients questions were answered with apparent satisfaction. The patient knows to call the clinic with any problems, questions or concerns.  I spent 30 mins  counseling the patient face to face. The total time spent in the appointment was 65mins and more than 50% was on counseling and direct patient cares.    Sullivan Lone MD [place] AAHIVMS [facility] Hematology/Oncology Physician [facility]  (Office):       [phone] (Work cell):  [phone] (Fax):           [phone]  04/29/2019 10:03 AM  I, Yevette Edwards, am acting as a scribe for Dr. Sullivan Lone.   .I have reviewed the above documentation for accuracy and completeness, and I agree with the above. Brunetta Genera MD

## 2019-04-30 ENCOUNTER — Inpatient Hospital Stay: Payer: PRIVATE HEALTH INSURANCE

## 2019-04-30 ENCOUNTER — Inpatient Hospital Stay: Payer: PRIVATE HEALTH INSURANCE | Attending: Hematology | Admitting: Hematology

## 2019-04-30 ENCOUNTER — Other Ambulatory Visit: Payer: Self-pay

## 2019-04-30 ENCOUNTER — Ambulatory Visit (HOSPITAL_COMMUNITY): Payer: PRIVATE HEALTH INSURANCE | Attending: Cardiology

## 2019-04-30 VITALS — BP 125/80 | HR 60 | Temp 99.0°F | Resp 17 | Ht 73.0 in | Wt 233.6 lb

## 2019-04-30 DIAGNOSIS — Z8249 Family history of ischemic heart disease and other diseases of the circulatory system: Secondary | ICD-10-CM | POA: Insufficient documentation

## 2019-04-30 DIAGNOSIS — I2609 Other pulmonary embolism with acute cor pulmonale: Secondary | ICD-10-CM

## 2019-04-30 DIAGNOSIS — I2602 Saddle embolus of pulmonary artery with acute cor pulmonale: Secondary | ICD-10-CM

## 2019-04-30 DIAGNOSIS — I517 Cardiomegaly: Secondary | ICD-10-CM | POA: Diagnosis not present

## 2019-04-30 DIAGNOSIS — Z7901 Long term (current) use of anticoagulants: Secondary | ICD-10-CM | POA: Diagnosis present

## 2019-04-30 DIAGNOSIS — I82412 Acute embolism and thrombosis of left femoral vein: Secondary | ICD-10-CM

## 2019-04-30 DIAGNOSIS — R7989 Other specified abnormal findings of blood chemistry: Secondary | ICD-10-CM | POA: Diagnosis not present

## 2019-04-30 DIAGNOSIS — I2692 Saddle embolus of pulmonary artery without acute cor pulmonale: Secondary | ICD-10-CM | POA: Diagnosis not present

## 2019-04-30 DIAGNOSIS — Z7289 Other problems related to lifestyle: Secondary | ICD-10-CM

## 2019-04-30 DIAGNOSIS — R918 Other nonspecific abnormal finding of lung field: Secondary | ICD-10-CM | POA: Insufficient documentation

## 2019-04-30 DIAGNOSIS — Z86718 Personal history of other venous thrombosis and embolism: Secondary | ICD-10-CM

## 2019-04-30 DIAGNOSIS — Z6830 Body mass index (BMI) 30.0-30.9, adult: Secondary | ICD-10-CM | POA: Insufficient documentation

## 2019-04-30 DIAGNOSIS — J449 Chronic obstructive pulmonary disease, unspecified: Secondary | ICD-10-CM | POA: Insufficient documentation

## 2019-04-30 DIAGNOSIS — Z86711 Personal history of pulmonary embolism: Secondary | ICD-10-CM

## 2019-04-30 LAB — CBC WITH DIFFERENTIAL/PLATELET
Abs Immature Granulocytes: 0.07 10*3/uL (ref 0.00–0.07)
Basophils Absolute: 0.1 10*3/uL (ref 0.0–0.1)
Basophils Relative: 1 %
Eosinophils Absolute: 0.1 10*3/uL (ref 0.0–0.5)
Eosinophils Relative: 1 %
HCT: 44.5 % (ref 39.0–52.0)
Hemoglobin: 14.4 g/dL (ref 13.0–17.0)
Immature Granulocytes: 1 %
Lymphocytes Relative: 20 %
Lymphs Abs: 2 10*3/uL (ref 0.7–4.0)
MCH: 28.3 pg (ref 26.0–34.0)
MCHC: 32.4 g/dL (ref 30.0–36.0)
MCV: 87.6 fL (ref 80.0–100.0)
Monocytes Absolute: 0.6 10*3/uL (ref 0.1–1.0)
Monocytes Relative: 6 %
Neutro Abs: 7.1 10*3/uL (ref 1.7–7.7)
Neutrophils Relative %: 71 %
Platelets: 250 10*3/uL (ref 150–400)
RBC: 5.08 MIL/uL (ref 4.22–5.81)
RDW: 15.8 % — ABNORMAL HIGH (ref 11.5–15.5)
WBC: 9.9 10*3/uL (ref 4.0–10.5)
nRBC: 0 % (ref 0.0–0.2)

## 2019-04-30 LAB — CMP (CANCER CENTER ONLY)
ALT: 21 U/L (ref 0–44)
AST: 23 U/L (ref 15–41)
Albumin: 4 g/dL (ref 3.5–5.0)
Alkaline Phosphatase: 75 U/L (ref 38–126)
Anion gap: 10 (ref 5–15)
BUN: 31 mg/dL — ABNORMAL HIGH (ref 8–23)
CO2: 24 mmol/L (ref 22–32)
Calcium: 9.5 mg/dL (ref 8.9–10.3)
Chloride: 107 mmol/L (ref 98–111)
Creatinine: 1.58 mg/dL — ABNORMAL HIGH (ref 0.61–1.24)
GFR, Est AFR Am: 53 mL/min — ABNORMAL LOW (ref >60)
GFR, Estimated: 46 mL/min — ABNORMAL LOW (ref >60)
Glucose, Bld: 90 mg/dL (ref 70–99)
Potassium: 3.8 mmol/L (ref 3.5–5.1)
Sodium: 141 mmol/L (ref 135–145)
Total Bilirubin: 0.7 mg/dL (ref 0.3–1.2)
Total Protein: 7.3 g/dL (ref 6.5–8.1)

## 2019-05-01 ENCOUNTER — Other Ambulatory Visit: Payer: Self-pay | Admitting: *Deleted

## 2019-05-01 DIAGNOSIS — I2602 Saddle embolus of pulmonary artery with acute cor pulmonale: Secondary | ICD-10-CM

## 2019-05-01 DIAGNOSIS — I82412 Acute embolism and thrombosis of left femoral vein: Secondary | ICD-10-CM

## 2019-05-02 LAB — MULTIPLE MYELOMA PANEL, SERUM
Albumin SerPl Elph-Mcnc: 3.7 g/dL (ref 2.9–4.4)
Albumin/Glob SerPl: 1.2 (ref 0.7–1.7)
Alpha 1: 0.2 g/dL (ref 0.0–0.4)
Alpha2 Glob SerPl Elph-Mcnc: 0.8 g/dL (ref 0.4–1.0)
B-Globulin SerPl Elph-Mcnc: 1 g/dL (ref 0.7–1.3)
Gamma Glob SerPl Elph-Mcnc: 1.1 g/dL (ref 0.4–1.8)
Globulin, Total: 3.1 g/dL (ref 2.2–3.9)
IgA: 286 mg/dL (ref 61–437)
IgG (Immunoglobin G), Serum: 1174 mg/dL (ref 603–1613)
IgM (Immunoglobulin M), Srm: 56 mg/dL (ref 20–172)
Total Protein ELP: 6.8 g/dL (ref 6.0–8.5)

## 2019-05-08 LAB — MISC LABCORP TEST (SEND OUT): Labcorp test code: 500309

## 2019-05-09 ENCOUNTER — Telehealth: Payer: Self-pay | Admitting: Pulmonary Disease

## 2019-05-09 NOTE — Telephone Encounter (Signed)
Message routed to Dr. Valeta Harms for review and recommendations.  Dr. Valeta Harms, the patient was last seen by you on 03/28/19.  I called [facility] and confirmed they received Eliquis prescription sent on 04/11/19. It was mailed to the patient on 04/12/19.  I called the patient to make him aware the prescription for Eliquis was issued with refills.   Patient stated he has been having pain in his thumb. What can he safely take?

## 2019-05-10 NOTE — Telephone Encounter (Signed)
LMTCB

## 2019-05-10 NOTE — Telephone Encounter (Signed)
PCCM:  While taking the eliquis I would avoid oral NSAIDs if possible.  Topical would have less GI effect.  The concern is development of gastric bleeding while on eliquis and the use of oral NSAIDS  Garner Nash, DO [facility] 05/10/2019 7:36 AM

## 2019-05-11 NOTE — Telephone Encounter (Signed)
Left message for patient to call back  

## 2019-05-14 NOTE — Telephone Encounter (Signed)
LMTCB

## 2019-05-15 LAB — JAK2 (INCLUDING V617F AND EXON 12), MPL,& CALR-NEXT GEN SEQ

## 2019-05-15 NOTE — Telephone Encounter (Signed)
ATC pt, line went to voicemail. LMTCB x4. I have sent a MyChart message to pt with BI's response, as multiple attempts have been made to reach this patient. Per triage protocol, will close out this message. Nothing further needed at this time.

## 2019-05-16 ENCOUNTER — Telehealth: Payer: Self-pay | Admitting: Pulmonary Disease

## 2019-05-16 NOTE — Telephone Encounter (Signed)
Called and spoke with Patient.  Dr Valeta Harms recommendations given. Understanding stated. Nothing further at this time.  Per Dr. Valeta Harms- "While taking the eliquis I would avoid oral NSAIDs if possible.  Topical would have less GI effect.  The concern is development of gastric bleeding while on eliquis and the use of oral NSAIDS."

## 2019-05-30 ENCOUNTER — Telehealth: Payer: Self-pay | Admitting: Pulmonary Disease

## 2019-05-30 NOTE — Telephone Encounter (Signed)
LMTCB x1 for pt.  

## 2019-05-31 ENCOUNTER — Telehealth: Payer: Self-pay

## 2019-05-31 NOTE — Telephone Encounter (Signed)
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  Appreciative of the call.  I explained that I did reach out to our pathology department in an effort to determine if we could review previous pathology slides.  They no longer have these available for review.  Due to the timeframe.  We discussed Covid risk while playing tennis.  And he was grateful for the call.  [facility] 05/31/2019 4:00 PM

## 2019-05-31 NOTE — Telephone Encounter (Signed)
Noted  

## 2019-05-31 NOTE — Telephone Encounter (Signed)
Will hold for 06/04/2019 once Dr. Tawanna Cooler returns.

## 2019-05-31 NOTE — Telephone Encounter (Signed)
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 He said that he has a scheduling conflict at that time but would be able to do later that morning or a different day.

## 2019-05-31 NOTE — Telephone Encounter (Signed)
Pt is returning call.  

## 2019-05-31 NOTE — Telephone Encounter (Signed)
I called and left a message for Dr. [name] offering that apt and requested a call back to confirm that he could do that day and time.

## 2019-05-31 NOTE — Telephone Encounter (Signed)
Spoke with pt. States that he would like Dr. Juline Patch recommendation on if he should go back to playing tennis. Pt is not worried about his breathing, he is more concerned with the COVID risk. While speaking to the pt he also wanted to know if Dr. Valeta Harms had spoken to Dr. Asencion Noble about the pt's old pathology reports that needed to be sent to [facility] for the pt's upcoming appointment.  Message will be sent to Dr. Valeta Harms and not the APP of the day due to that nature of the message.

## 2019-05-31 NOTE — Telephone Encounter (Signed)
New pt slot has been opened on 06/07/2019 at 8 am. Will fwd to new pt's schedulers.

## 2019-05-31 NOTE — Telephone Encounter (Signed)
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 Jannifer [name]. I see that Dr. Jannifer [name] doesn't have any availability. Please advise

## 2019-06-04 ENCOUNTER — Other Ambulatory Visit: Payer: Self-pay

## 2019-06-04 ENCOUNTER — Ambulatory Visit: Payer: PRIVATE HEALTH INSURANCE | Admitting: Neurology

## 2019-06-04 ENCOUNTER — Encounter: Payer: Self-pay | Admitting: Neurology

## 2019-06-04 VITALS — BP 125/79 | HR 85 | Temp 97.7°F | Ht 72.0 in | Wt 241.0 lb

## 2019-06-04 DIAGNOSIS — R5382 Chronic fatigue, unspecified: Secondary | ICD-10-CM

## 2019-06-04 DIAGNOSIS — R413 Other amnesia: Secondary | ICD-10-CM

## 2019-06-04 NOTE — Telephone Encounter (Signed)
I reached out to Dr. [name] about scheduling his appointment. Dr. Jannifer [name] has several openings to day [facility]) have been reschedule. Wanting to see if the pt could accept an appt this afternoon?

## 2019-06-04 NOTE — Progress Notes (Signed)
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  He indicates that over the last 2 to 3 months he has had some memory lapses where he might leave a door open or a garage door open, he is still working as an Editor, commissioning, he has not had any issues with cognitive lapses at work.  He reports no difficulty with word finding or any problems with short-term memory.  The patient has been able to operate a motor vehicle without difficulty, he is able to manage finances and keep up with medications and appointments.  He sleeps fairly well at night but he is requiring more sleep, he may have some drowsiness towards end of the day.  He does snore at night, he does not believe that this is a significant issue however.  He reports no focal numbness or weakness of the face, arms, or legs.  He does have some mild gait instability.  A CT scan of the brain done 9 years ago mentioned the presence of mild small vessel disease.  He comes to this office mainly because his wife is concerned and wishes to have him evaluated.  Over the last 2 to 3 months as well, he has noted some mild tremors involving both hands, his wife has also noted some occasional motor tics with the mouth and tongue.  The tremor does seem to impair his ability to perform as a Psychologist, sport and exercise at times.  He currently is on B12 supplementation orally.  Past Medical History:  Diagnosis Date  . 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HCC)    ongoing evaluation for elevated R>L adosterone secretion  . Arthritis   . Atrial enlargement, bilateral   . Atrial fibrillation (HCC)    paroxysmal  . Atrial flutter ([place])   . Complication of anesthesia    could not urinate after UHR, needed cath, mental fog sinc cervical fusion 08-09-16   .  Dysrhythmia    a-fib-had ablation x2  . GERD (gastroesophageal reflux disease)   . Hyperaldosteronism ([place])   . Hypertension   . Kidney cysts    "multiple"  . Nephrolithiasis   . Pneumonia 1990's  . Pre-diabetes   . Prostatitis   . Renal insufficiency    "Cr 1.6"    Past Surgical History:  Procedure Laterality Date  . ABLATION    . ATRIAL FIBRILLATION ABLATION N/A 09/28/2012   Procedure: ATRIAL FIBRILLATION ABLATION;  Surgeon: Thompson Grayer, MD;  [facility] C[facility] LAB;  Service: Cardiovascular;  Laterality: N/A;  . atrial fibrillation ablation with CTI ablation  05/27/11, 09/28/12   ablation for afib and atrial flutter by Dr Rayann Heman  . BACK SURGERY  08/09/2016   C 5 6  C 6 to C 7 cervical fusion  . CARDIOVERSION  07/28/2011   Procedure: CARDIOVERSION;  Surgeon: Lelon Perla, MD;  Location: [place];  Service: Cardiovascular;  Laterality: N/A;  . COLONOSCOPY WITH PROPOFOL N/A 03/03/2015   Procedure: COLONOSCOPY WITH PROPOFOL;  Surgeon: Garlan Fair, MD;  Location: [facility];  Service: Gastroenterology;  Laterality: N/A;  . double j stent placement  06/2004   left  . ESOPHAGOGASTRODUODENOSCOPY (EGD) WITH PROPOFOL N/A 03/03/2015   Procedure: ESOPHAGOGASTRODUODENOSCOPY (EGD) WITH PROPOFOL;  Surgeon: Garlan Fair, MD;  [facility]: [facility];  Service: Gastroenterology;  Laterality: N/A;  . ESOPHAGOGASTRODUODENOSCOPY (EGD) WITH PROPOFOL N/A 10/25/2016   Procedure: ESOPHAGOGASTRODUODENOSCOPY (EGD) WITH PROPOFOL;  Surgeon: Garlan Fair, MD;  [facility];  Service: Endoscopy;  Laterality: N/A;  . INGUINAL HERNIA REPAIR     "as a child; ? right"  . INGUINAL HERNIA REPAIR Left 06/23/2016   Procedure: OPEN LEFT INGUINAL HERNIA  REPAIR WITH MESH;  Surgeon: Johnathan Hausen, MD;  Location: [place];  Service: General;  Laterality: Left;  . INSERTION OF MESH Left 06/23/2016   Procedure: INSERTION OF MESH;  Surgeon: Johnathan Hausen, MD;  Location: [place];  Service: General;  Laterality: Left;  . LEFT HEART CATH AND CORONARY ANGIOGRAPHY N/A 07/25/2018   Procedure: LEFT HEART CATH AND CORONARY ANGIOGRAPHY;  Surgeon: Belva Crome, MD;  Location: [place] CV LAB;  Service: Cardiovascular;  Laterality: N/A;  . LUNG BIOPSY  1993  . NASAL SEPTOPLASTY W/ TURBINOPLASTY  07/2006  . prostate biopsy  2011  . schwannoma removal  ~01/2010   left thorax  . TEE WITHOUT CARDIOVERSION N/A 09/27/2012   Procedure: TRANSESOPHAGEAL ECHOCARDIOGRAM (TEE);  Surgeon: Larey Dresser, MD;  Location: [place];  Service: Cardiovascular;  Laterality: N/A;  . ULTRASOUND GUIDANCE FOR VASCULAR ACCESS  07/25/2018   Procedure: Ultrasound Guidance For Vascular Access;  Surgeon: Belva Crome, MD;  Location: [place] CV LAB;  Service: Cardiovascular;;  . UMBILICAL HERNIA REPAIR  07/2006    Family History  Problem Relation Age of Onset  . Atrial fibrillation Other        sister has had 2 afib ablations  . Atrial fibrillation Sister     Social history:  reports that he has never smoked. He has never used smokeless tobacco. He reports current alcohol use of about 2.0 standard drinks of alcohol per week. He reports that he does not use drugs.  Medications:  Prior to Admission medications   Medication Sig Start Date End Date Taking? Authorizing Provider  allopurinol (ZYLOPRIM) 100 MG tablet Take 100 mg by mouth 2 (two) times daily.    Yes [provider]  apixaban (ELIQUIS) 5 MG TABS tablet Take 1 tablet (5 mg total) by mouth 2 (two) times daily. 04/11/19  Yes [name], Octavio Graves, DO  Ascorbic Acid (VITAMIN C) 1000 MG tablet Take 1,000 mg by mouth daily.   Yes [provider]  atorvastatin (LIPITOR) 10 MG tablet Take 10 mg by mouth every evening.    Yes [provider]  CIALIS 20 MG tablet Take 20 mg by mouth daily as needed for erectile dysfunction.  09/23/16  Yes [provider]  diltiazem (CARDIZEM CD) 360 MG 24 hr capsule Take  360 mg by mouth every evening. 10/04/16  Yes [provider]  eplerenone (INSPRA) 50 MG tablet Take 50 mg by mouth 2 (two) times daily.   Yes [provider]  Fluticasone-Umeclidin-Vilant (TRELEGY ELLIPTA IN) Inhale into the lungs.   Yes [provider]  Glucosamine HCl (GLUCOSAMINE PO) Take by mouth.   Yes [provider]  hydrochlorothiazide (MICROZIDE) 12.5 MG capsule Take 12.5 mg by mouth daily.   Yes [provider]  loperamide (IMODIUM A-D) 2 MG tablet Take 2 mg by mouth 2 (two) times daily.   Yes [provider]  losartan (COZAAR) 100 MG tablet Take 100 mg by mouth daily.   Yes [provider]  Multiple Vitamins-Minerals (ZINC PO) Take 1 tablet by mouth 2 (two) times daily.   Yes  [provider]  omeprazole (PRILOSEC) 40 MG capsule Take 40 mg by mouth daily before supper.    Yes [provider]  oxybutynin (DITROPAN-XL) 10 MG 24 hr tablet Take 10 mg by mouth daily. 01/28/18  Yes [provider]  Potassium 99 MG TABS Take 198 mg by mouth 2 (two) times daily.   Yes [provider]  sertraline (ZOLOFT) 50 MG tablet Take 1 tablet (50 mg total) by mouth every evening. Patient taking differently: Take 100 mg by mouth every evening. 100mg  daily 12/07/17  Yes Eksir, Richard Miu, MD  vitamin B-12 (CYANOCOBALAMIN) 1000 MCG tablet Take 1,000 mcg by mouth daily.   Yes [provider]      Allergies  Allergen Reactions  . Aldactone [Spironolactone]     Breast tissue enlargement  . Codeine Other (See Comments)    Headache   . Lisinopril Cough  . Nexium [Esomeprazole Magnesium] Diarrhea  . Sulfa Antibiotics Rash  . Sulfa Drugs Cross Reactors Rash    ROS:  Out of a complete 14 system review of symptoms, the patient complains only of the following symptoms, and all other reviewed systems are negative.  Mild memory disturbance Tremors Motor tics  Blood pressure 125/79, pulse 85,  temperature 97.7 F (36.5 C), temperature source Temporal, height 6' (1.829 m), weight 241 lb (109.3 kg).  Physical Exam  General: The patient is alert and cooperative at the time of the examination.  Eyes: Pupils are equal, round, and reactive to light. Discs are flat bilaterally.  Neck: The neck is supple, no carotid bruits are noted.  Respiratory: The respiratory examination is clear.  Cardiovascular: The cardiovascular examination reveals a regular rate and rhythm, no obvious murmurs or rubs are noted.  Skin: Extremities are without significant edema.  Neurologic Exam  Mental status: The patient is alert and oriented x 3 at the time of the examination. The Moca test reveals a total score of 27/30.  Cranial nerves: Facial symmetry is present. There is good sensation of the face to pinprick and soft touch bilaterally. The strength of the facial muscles and the muscles to head turning and shoulder shrug are normal bilaterally. Speech is well enunciated, no aphasia or dysarthria is noted. Extraocular movements are full. Visual fields are full. The tongue is midline, and the patient has symmetric elevation of the soft palate. No obvious hearing deficits are noted.  Motor: The motor testing reveals 5 over 5 strength of all 4 extremities. Good symmetric motor tone is noted throughout.  Sensory: Sensory testing is intact to pinprick, soft touch, vibration sensation, and position sense on all 4 extremities. No evidence of extinction is noted.  Coordination: Cerebellar testing reveals good finger-nose-finger and heel-to-shin bilaterally.  A mild action tremors noted with finger-nose-finger bilaterally.  Gait and station: Gait is normal. Tandem gait is slightly unsteady. Romberg is negative. No drift is seen.  Reflexes: Deep tendon reflexes are symmetric and normal bilaterally. Toes are downgoing bilaterally.   Assessment/Plan:  1.  Reported mild issue with "memory lapses"  2.  Tremor   3.  Motor tics, orofacial  The patient will be sent for some blood work today.  He will undergo MRI of the brain, he does have risk factors for cerebrovascular disease.  He will self monitor his cognitive issues, if there are concerns in the next 6 to 12 months, he will contact me.  Jill Alexanders MD 06/04/2019 4:31 PM  [facility] [place], [place]  [address]  Phone [phone] Fax [phone]

## 2019-06-04 NOTE — Telephone Encounter (Signed)
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 He accepted the 415 slot for 06/04/2019 with Dr. Jannifer [name].

## 2019-06-05 LAB — TSH: TSH: 2.15 u[IU]/mL (ref 0.450–4.500)

## 2019-06-05 LAB — SEDIMENTATION RATE: Sed Rate: 19 mm/hr (ref 0–30)

## 2019-07-04 ENCOUNTER — Telehealth: Payer: Self-pay

## 2019-07-06 ENCOUNTER — Other Ambulatory Visit: Payer: Self-pay

## 2019-07-06 ENCOUNTER — Ambulatory Visit
Admission: RE | Admit: 2019-07-06 | Discharge: 2019-07-06 | Disposition: A | Discharge status: Home / Self Care | Payer: PRIVATE HEALTH INSURANCE | Source: Ambulatory Visit | Attending: Neurology | Admitting: Neurology

## 2019-07-06 ENCOUNTER — Telehealth: Payer: Self-pay | Admitting: Neurology

## 2019-07-06 DIAGNOSIS — R413 Other amnesia: Secondary | ICD-10-CM

## 2019-07-06 NOTE — Telephone Encounter (Signed)
I called the patient.  MRI shows some mild cortical atrophy, slightly more prominent since 2013.  Mild white matter changes are seen, the white matter changes are not extensive, unlikely be a sole cause of memory issues.  The patient is a self monitor memory issues, he will call me in the next 6 months or so if things are clearly progressing.   MRI brain 07/06/19:  IMPRESSION: 1. No acute intracranial abnormality. 2. Mild cerebral volume loss since 2013 which appears generalized. Mild to moderate for age nonspecific cerebral white matter signal changes, most commonly due to chronic small vessel disease. 3. Mild chronic intracranial artery tortuosity appears stable since the 2013 MRA.

## 2019-07-11 ENCOUNTER — Telehealth (INDEPENDENT_AMBULATORY_CARE_PROVIDER_SITE_OTHER): Payer: PRIVATE HEALTH INSURANCE | Admitting: Internal Medicine

## 2019-07-11 ENCOUNTER — Telehealth: Payer: Self-pay | Admitting: *Deleted

## 2019-07-11 ENCOUNTER — Encounter: Payer: Self-pay | Admitting: Internal Medicine

## 2019-07-11 VITALS — BP 137/77 | HR 63 | Ht 72.0 in | Wt 230.0 lb

## 2019-07-11 DIAGNOSIS — D6869 Other thrombophilia: Secondary | ICD-10-CM

## 2019-07-11 DIAGNOSIS — I480 Paroxysmal atrial fibrillation: Secondary | ICD-10-CM | POA: Diagnosis not present

## 2019-07-11 DIAGNOSIS — R0602 Shortness of breath: Secondary | ICD-10-CM

## 2019-07-11 DIAGNOSIS — I10 Essential (primary) hypertension: Secondary | ICD-10-CM

## 2019-07-11 DIAGNOSIS — Q268 Other congenital malformations of great veins: Secondary | ICD-10-CM

## 2019-07-11 NOTE — Telephone Encounter (Signed)
CT order placed. Will forward to CT scheduler to arrange Recall placed for 6 months

## 2019-07-11 NOTE — Telephone Encounter (Signed)
-----   Message from Thompson Grayer, MD sent at 07/11/2019 10:11 AM EST ----- Cardiac CT-  evaluate for pulmonary vein stenosis,  evaluate SOB   Follow-up with me in 6 months

## 2019-07-11 NOTE — Progress Notes (Signed)
Electrophysiology TeleHealth Note   Due to national recommendations of social distancing due to COVID 19, an audio/video telehealth visit is felt to be most appropriate for this patient at this time.  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  Since last being seen in our clinic, the patient reports doing reasonably well.  He is planning to retire in March.   He had saddle PTE in August moderate RV strain.  He has been placed on eliquis and is doing well.  He continues to have SOB with moderate activity.  Very mild pedal edema.  Today, he denies symptoms of palpitations, chest pain,  dizziness, presyncope, or syncope.  The patient is otherwise without complaint today.  The patient denies symptoms of fevers, chills, cough, or new SOB worrisome for COVID 19.  Past Medical History:  Diagnosis Date  . 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HCC)    ongoing evaluation for elevated R>L adosterone secretion  . Arthritis   . Atrial enlargement, bilateral   . Atrial fibrillation (HCC)    paroxysmal  . Atrial flutter ([place])   . Complication of anesthesia    could not urinate after UHR, needed cath, mental fog sinc cervical fusion 08-09-16   . Dysrhythmia    a-fib-had ablation x2  . GERD (gastroesophageal reflux disease)   . Hyperaldosteronism ([place])   . Hypertension   . Kidney cysts    "multiple"  . Nephrolithiasis   . Pneumonia 1990's  . Pre-diabetes   . Prostatitis   . Renal insufficiency    "Cr 1.6"    Past Surgical History:  Procedure  Laterality Date  . ABLATION    . ATRIAL FIBRILLATION ABLATION N/A 09/28/2012   Procedure: ATRIAL FIBRILLATION ABLATION;  Surgeon: Thompson Grayer, MD;  Location: [facility] CATH LAB;  Service: Cardiovascular;  Laterality: N/A;  . atrial fibrillation ablation with CTI ablation  05/27/11, 09/28/12   ablation for afib and atrial flutter by Dr Rayann Heman  . BACK SURGERY  08/09/2016   C 5 6  C 6 to C 7 cervical fusion  . CARDIOVERSION  07/28/2011   Procedure: CARDIOVERSION;  Surgeon: Lelon Perla, MD;  Location: [place];  Service: Cardiovascular;  Laterality: N/A;  . COLONOSCOPY WITH PROPOFOL N/A 03/03/2015   Procedure: COLONOSCOPY WITH PROPOFOL;  Surgeon: Garlan Fair, MD;  [facility];  Service: Gastroenterology;  Laterality: N/A;  . double j stent placement  06/2004   left  . ESOPHAGOGASTRODUODENOSCOPY (EGD) WITH PROPOFOL N/A 03/03/2015   Procedure: ESOPHAGOGASTRODUODENOSCOPY (EGD) WITH PROPOFOL;  Surgeon: Garlan Fair, MD;  [facility];  Service: Gastroenterology;  Laterality: N/A;  . ESOPHAGOGASTRODUODENOSCOPY (EGD) WITH PROPOFOL N/A 10/25/2016   Procedure: ESOPHAGOGASTRODUODENOSCOPY (EGD) WITH PROPOFOL;  Surgeon: Garlan Fair, MD;  Location: [facility];  Service: Endoscopy;  Laterality: N/A;  . INGUINAL HERNIA REPAIR     "as a child; ? right"  . INGUINAL HERNIA REPAIR Left 06/23/2016   Procedure: OPEN LEFT INGUINAL HERNIA  REPAIR WITH MESH;  Surgeon: Johnathan Hausen, MD;  Location: [place]  [facility];  Service: General;  Laterality: Left;  . INSERTION OF MESH Left 06/23/2016   Procedure: INSERTION OF MESH;  Surgeon: Johnathan Hausen, MD;  Location: [place];  Service: General;  Laterality: Left;  . LEFT HEART CATH AND CORONARY ANGIOGRAPHY N/A 07/25/2018   Procedure: LEFT HEART CATH AND CORONARY ANGIOGRAPHY;  Surgeon: Belva Crome, MD;  Location: [place] CV LAB;  Service: Cardiovascular;  Laterality: N/A;  . LUNG BIOPSY  1993  . NASAL SEPTOPLASTY W/  TURBINOPLASTY  07/2006  . prostate biopsy  2011  . schwannoma removal  ~01/2010   left thorax  . TEE WITHOUT CARDIOVERSION N/A 09/27/2012   Procedure: TRANSESOPHAGEAL ECHOCARDIOGRAM (TEE);  Surgeon: Larey Dresser, MD;  Location: [place];  Service: Cardiovascular;  Laterality: N/A;  . ULTRASOUND GUIDANCE FOR VASCULAR ACCESS  07/25/2018   Procedure: Ultrasound Guidance For Vascular Access;  Surgeon: Belva Crome, MD;  Location: [place] CV LAB;  Service: Cardiovascular;;  . UMBILICAL HERNIA REPAIR  07/2006    Current Outpatient Medications  Medication Sig Dispense Refill  . allopurinol (ZYLOPRIM) 100 MG tablet Take 100 mg by mouth 2 (two) times daily.     Marland Kitchen apixaban (ELIQUIS) 5 MG TABS tablet Take 1 tablet (5 mg total) by mouth 2 (two) times daily. 180 tablet 3  . Ascorbic Acid (VITAMIN C) 1000 MG tablet Take 1,000 mg by mouth daily.    Marland Kitchen atorvastatin (LIPITOR) 10 MG tablet Take 10 mg by mouth every evening.     Marland Kitchen CIALIS 20 MG tablet Take 20 mg by mouth daily as needed for erectile dysfunction.   11  . diltiazem (CARDIZEM CD) 360 MG 24 hr capsule Take 360 mg by mouth every evening.  7  . eplerenone (INSPRA) 50 MG tablet Take 50 mg by mouth 2 (two) times daily.    . Fluticasone-Umeclidin-Vilant (TRELEGY ELLIPTA IN) Inhale 1 puff into the lungs daily.     . Glucosamine HCl (GLUCOSAMINE PO) Take by mouth.    . hydrochlorothiazide (MICROZIDE) 12.5 MG capsule Take 12.5 mg by mouth daily.    Marland Kitchen loperamide (IMODIUM A-D) 2 MG tablet Take 2 mg by mouth 2 (two) times daily.    Marland Kitchen losartan (COZAAR) 100 MG tablet Take 100 mg by mouth daily.    . Multiple Vitamins-Minerals (ZINC PO) Take 1 tablet by mouth 2 (two) times daily.    Marland Kitchen omeprazole (PRILOSEC) 40 MG capsule Take 40 mg by mouth daily before supper.     Marland Kitchen oxybutynin (DITROPAN-XL) 10 MG 24 hr tablet Take 10 mg by mouth daily.    . Potassium Gluconate 2.5 MEQ TABS Take 3 tablets by mouth 2 (two) times daily.    . sertraline (ZOLOFT) 50 MG  tablet Take 1 tablet (50 mg total) by mouth every evening. (Patient taking differently: Take 100 mg by mouth every evening. 100mg  daily) 90 tablet 1  . vitamin B-12 (CYANOCOBALAMIN) 1000 MCG tablet Take 1,000 mcg by mouth daily.     No current facility-administered medications for this visit.    Allergies:   Aldactone [spironolactone], Codeine, Lisinopril, Nexium [esomeprazole magnesium], Sulfa antibiotics, and Sulfa drugs cross reactors   Social History:  The patient  reports that he has never smoked. He has never used smokeless tobacco. He reports current alcohol use of about 2.0 standard drinks of alcohol per week. He reports that he does not use drugs.   Family History:  The patient's family history includes Atrial fibrillation in his sister and  another family member.   ROS:  Please see the history of present illness.   All other systems are personally reviewed and negative.    Exam:    Vital Signs:  BP 137/77   Pulse 63   Ht 6' (1.829 m)   Wt 230 lb (104.3 kg)   SpO2 96%   BMI 31.19 kg/m   Well sounding and appearing, alert and conversant, regular work of breathing,  good skin color Eyes- anicteric, neuro- grossly intact, skin- no apparent rash or lesions or cyanosis, mouth- oral mucosa is pink  Labs/Other Tests and Data Reviewed:    Recent Labs: 08/16/2018: B Natriuretic Peptide 48.7 08/31/2018: Pro B Natriuretic peptide (BNP) 46.0 03/15/2019: Magnesium 1.7 04/30/2019: ALT 21; BUN 31; Creatinine 1.58; Hemoglobin 14.4; Platelets 250; Potassium 3.8; Sodium 141 06/04/2019: TSH 2.150   Wt Readings from Last 3 Encounters:  07/11/19 230 lb (104.3 kg)  06/04/19 241 lb (109.3 kg)  04/30/19 233 lb 9.6 oz (106 kg)       ASSESSMENT & PLAN:    1.  Paroxysmal atrial fibrillation No afib events.  Doing well chads2vasc score is 2.  He also has had a large PTE.  We have discussed at length.  I would advise life long anticoagulation.  2. SOB Chronic Prior cath was normal.  Recent  echo reviewed Likely pulmonary in etiology and complicated by recent pneumonia and PTE I will obtain cardiac CT to evaluate for pulmonary vein stenosis, though this seems less likely the cause.  3. HTN Stable No change required today  4. Hypokalemia improved   Follow-up:  6 months with me   Patient Risk:  after full review of this patients clinical status, I feel that they are at moderate risk at this time.  Today, I have spent 25 minutes with the patient with telehealth technology discussing arrhythmia management .    Army Fossa, MD  07/11/2019 9:52 AM     [facility] [address] [place] [address] [phone] (office) [phone] (fax)

## 2019-07-13 ENCOUNTER — Ambulatory Visit: Payer: PRIVATE HEALTH INSURANCE | Attending: Internal Medicine

## 2019-07-13 DIAGNOSIS — Z20822 Contact with and (suspected) exposure to covid-19: Secondary | ICD-10-CM

## 2019-07-14 LAB — NOVEL CORONAVIRUS, NAA: SARS-CoV-2, NAA: NOT DETECTED

## 2019-07-18 ENCOUNTER — Other Ambulatory Visit: Payer: PRIVATE HEALTH INSURANCE

## 2019-07-18 NOTE — Addendum Note (Signed)
Addended by: Stanton Kidney on: 07/18/2019 09:45 AM   Modules accepted: Orders

## 2019-08-09 ENCOUNTER — Other Ambulatory Visit: Payer: PRIVATE HEALTH INSURANCE

## 2019-08-10 ENCOUNTER — Telehealth: Payer: Self-pay | Admitting: Internal Medicine

## 2019-08-10 NOTE — Telephone Encounter (Signed)
Patient is calling stating he needs to have a sequel nerve stimulator implanted and he was wanting to check with Dr. Rayann Heman to assure he can go ahead and have the office performing the procedure reach out for clearance so it can be scheduled. He states he had a procedure done by our office and was advised to not have anything performed for 6 months and wanted to clarify before having them reach out in regards to it.

## 2019-08-10 NOTE — Telephone Encounter (Signed)
Patient is returning call stating he will have the office reach out in regards to surgical clearance.

## 2019-08-10 NOTE — Telephone Encounter (Signed)
Left a message for the pt that in regards to his upcoming surgery I am going to need the surgeons office to fax over a clearance form with type of procedure, anesthesia, meds to be held if any, date of surgery, name of surgeon.

## 2019-08-16 ENCOUNTER — Other Ambulatory Visit: Payer: Self-pay | Admitting: Emergency Medicine

## 2019-08-16 MED ORDER — TRELEGY ELLIPTA 100-62.5-25 MCG/INH IN AEPB: 1.0000 | INHALATION_SPRAY | Freq: Every day | RESPIRATORY_TRACT | 5 refills | Status: DC

## 2019-08-20 ENCOUNTER — Telehealth (HOSPITAL_COMMUNITY): Payer: Self-pay | Admitting: Emergency Medicine

## 2019-08-20 ENCOUNTER — Encounter (HOSPITAL_COMMUNITY): Payer: Self-pay

## 2019-08-20 ENCOUNTER — Other Ambulatory Visit: Payer: Self-pay

## 2019-08-20 ENCOUNTER — Other Ambulatory Visit: Payer: PRIVATE HEALTH INSURANCE | Admitting: *Deleted

## 2019-08-20 ENCOUNTER — Telehealth: Payer: Self-pay

## 2019-08-20 DIAGNOSIS — Q268 Other congenital malformations of great veins: Secondary | ICD-10-CM

## 2019-08-20 LAB — BASIC METABOLIC PANEL
BUN/Creatinine Ratio: 18 (ref 10–24)
BUN: 30 mg/dL — ABNORMAL HIGH (ref 8–27)
CO2: 28 mmol/L (ref 20–29)
Calcium: 9.8 mg/dL (ref 8.6–10.2)
Chloride: 106 mmol/L (ref 96–106)
Creatinine, Ser: 1.7 mg/dL — ABNORMAL HIGH (ref 0.76–1.27)
GFR calc Af Amer: 48 mL/min/{1.73_m2} — ABNORMAL LOW (ref >59)
GFR calc non Af Amer: 41 mL/min/{1.73_m2} — ABNORMAL LOW (ref >59)
Glucose: 78 mg/dL (ref 65–99)
Potassium: 4.2 mmol/L (ref 3.5–5.2)
Sodium: 141 mmol/L (ref 134–144)

## 2019-08-20 NOTE — Telephone Encounter (Signed)
Call placed to Pt.  Will get stat BMP today to determine kidney function prior to cardiac CT scheduled for /  Pt indicates understanding.  Order placed.  SW aware.

## 2019-08-20 NOTE — Telephone Encounter (Signed)
Pt returning phone call regarding CTA for tomorrow. I explained the procedure and what would happen should his lab work come back abnormal.   Pt agreed with plan. Denies further questions. Appreciated the call  Marchia Bond RN Navigator Cardiac Imaging [facility] [phone] Office  [phone] Cell

## 2019-08-20 NOTE — Telephone Encounter (Signed)
Left message on voicemail with name and callback number Gilmar Bua RN Navigator Cardiac Imaging [facility] [phone] Office [phone] Cell  

## 2019-08-21 ENCOUNTER — Telehealth (HOSPITAL_COMMUNITY): Payer: Self-pay | Admitting: Emergency Medicine

## 2019-08-21 ENCOUNTER — Ambulatory Visit (HOSPITAL_COMMUNITY): Admission: RE | Admit: 2019-08-21 | Payer: PRIVATE HEALTH INSURANCE | Source: Ambulatory Visit

## 2019-08-21 NOTE — Telephone Encounter (Signed)
Phone call to patient to inform him that creat/GFR worsened and cannot get contrast per radiology protocol.  I was able to reschedule him for Thursday at 10:30a and arrange a medical day appt for IV hydration at [place] patient to check in admitting at 8:45a for his appt.  Pt denies further questions. Appreciated the call.   Marchia Bond RN Navigator Cardiac Imaging [facility] [phone] Office  [phone] Cell

## 2019-08-22 ENCOUNTER — Ambulatory Visit: Payer: PRIVATE HEALTH INSURANCE | Admitting: Pulmonary Disease

## 2019-08-22 ENCOUNTER — Encounter: Payer: Self-pay | Admitting: Pulmonary Disease

## 2019-08-22 ENCOUNTER — Encounter (HOSPITAL_COMMUNITY): Payer: Self-pay

## 2019-08-22 ENCOUNTER — Other Ambulatory Visit: Payer: Self-pay

## 2019-08-22 VITALS — BP 124/76 | HR 78 | Ht 72.0 in | Wt 239.4 lb

## 2019-08-22 DIAGNOSIS — Z86711 Personal history of pulmonary embolism: Secondary | ICD-10-CM

## 2019-08-22 DIAGNOSIS — R0600 Dyspnea, unspecified: Secondary | ICD-10-CM

## 2019-08-22 DIAGNOSIS — R0609 Other forms of dyspnea: Secondary | ICD-10-CM

## 2019-08-22 DIAGNOSIS — J449 Chronic obstructive pulmonary disease, unspecified: Secondary | ICD-10-CM | POA: Diagnosis not present

## 2019-08-22 DIAGNOSIS — Z7901 Long term (current) use of anticoagulants: Secondary | ICD-10-CM

## 2019-08-22 NOTE — Progress Notes (Signed)
Synopsis: Self referral in November 2019 for SOB.   Subjective:   PATIENT ID: Casey [name], Dr. Theadora Rama: male DOB: 1955-12-14, MRN: [id number]  Chief Complaint  Patient presents with  . Follow-up    Pt states he is still having problems with SOB as he states when he goes for a walk with spouse, he will have to stop several times to get his breath. Pt denies any complaints of cough.    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 PMH of atrial fibrillation as well as a several week hospitalization for respiratory failure requiring lung biopsy by Dr. Arlyce Dice several years ago.  He was out of work for approximately 3 months.  There was a question of whether or not he developed hantavirus?  After a trip out [place].  He also has a history of a schwannoma removal from an intercostal nerve.  He has always had a elevated left hemidiaphragm. Father with history of DVT X 2. He was on xarelto in the past. He traveled to [place] recently. He felt some right calf pain, no swelling or warmth. The past persisted for the past two weeks. D-dimer was positive 3, US duplex with superficial thrombophebitis.  The patient is a Stage manager.  He does play tennis once a week.  He does feel that while he is playing tennis he gets more short of breath on occasion.  He has noticed some limitation in his physical activity when he significantly exerts himself.  He mainly just wants to make sure there is not anything wrong with his lungs that may cause him to be short of breath.  He did have a recent chest x-ray that looked normal as well as a VQ scan that was completed in the ED due to his elevation of his serum creatinine that was low probability.  He denies chest pain or palpitations.  He does state he knows when he is in atrial fibrillation.  He is very sensitive to to knowing when he is in atrial fibrillation and has had ablation in the past.  He is a lifelong non-smoker with evidence of rather  obstructed lung disease.  He has had significant injury to the lung in the past before due to a questionable viral/aspiration pneumonia type event that he ended up in the hospital requiring a long stay and ultimately underwent VATS.  One must consider a post inflammatory type small airway disease such as BO but he has no other symptoms.  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  OV 08/31/2018: Since last office visit patient was unfortunately mid to the hospital for hypoxemic episode as well as a lower lobe pneumonia with small infiltrate.  He was treated with antibiotics steroids and discharged.  Since that he has been last seen in the office he is also had cardiac evaluation to include left heart catheterization.  Patient had pulmonary function tests completed prior to office visit today which reveals a ratio of 68 and a FEV1 of 55% predicted.  Consistent with moderate obstructive disease.  Overall since his hospitalization he has been improving slowly.  Occasionally does feel that he has chest tightness.  He still occasionally has wheeze.  He does feel that he was breathing much better using a nebulizer in the hospital and would like to have one at home.  OV 03/28/2019: Since last office visit patient  was admitted to the hospital for submassive PE.  Patient was in the at [facility] operating room developed abrupt onset of lightheadedness crash down and ultimately synced to the floor and about to perform a tonsillectomy.  He had a funny sensation in his chest and was taken to the emergency room.  CTA revealed submassive PE.  He was started on heparin.  Bilateral lower lobe pulmonary branches involved with a RV/LV ratio of 1.2.  Patient had multiple hematologic studies completed during hospitalization.  Patient was discharged on Eliquis.  Today, is able to recall his whole encounter. His father had a  DVT after a hip replacement and travel history from [place] develop after travel.  Patient at this time still feeling fatigued with significant exertion.  He has been walking around the neighborhood with his wife able to do up to 3 miles at a time.  OV 08/22/2019: Here today for follow up.  Patient still with ongoing complaints of dyspnea on exertion.  Today in the office we discussed this in detail.  We recently attempted to reach out to pathology to see if we could find his prior VATS pathology results from greater than 20 years ago.  Unfortunately pathology does not hold these specimens that long.  He does have his consultation at [facility] next week.  Prior to this having PFTs completed.  Hematologic evaluation completed by Dr. Irene Limbo was unrevealing for reasons of hypercoagulability.  Additionally, patient's case was discussed with pulmonologist colleague Lyda Jester, MD.  Patient has coronary CT scan planned for tomorrow.    Past Medical History:  Diagnosis Date  . 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HCC)    ongoing evaluation for elevated R>L adosterone secretion  . Arthritis   . Atrial enlargement, bilateral   . Atrial fibrillation (HCC)    paroxysmal  . Atrial flutter ([place])   . Complication of anesthesia    could not urinate after UHR, needed cath, mental fog sinc cervical fusion 08-09-16   . Dysrhythmia    a-fib-had ablation x2  . GERD (gastroesophageal reflux disease)   . Hyperaldosteronism ([place])   . Hypertension   . Kidney cysts    "multiple"  . Nephrolithiasis   . Pneumonia 1990's  . Pre-diabetes   . Prostatitis   . Renal insufficiency    "Cr 1.6"     Family History  Problem Relation Age of Onset  . Atrial fibrillation Other        sister has had 2 afib ablations  . Atrial fibrillation Sister      Past Surgical History:  Procedure Laterality Date  . ABLATION    . ATRIAL FIBRILLATION ABLATION N/A 09/28/2012   Procedure: ATRIAL FIBRILLATION ABLATION;  Surgeon: Thompson Grayer, MD;   Location: [facility] CATH LAB;  Service: Cardiovascular;  Laterality: N/A;  . atrial fibrillation ablation with CTI ablation  05/27/11, 09/28/12   ablation for afib and atrial flutter by Dr Rayann Heman  . BACK SURGERY  08/09/2016   C 5 6  C 6 to C 7 cervical fusion  . CARDIOVERSION  07/28/2011   Procedure: CARDIOVERSION;  Surgeon: Lelon Perla, MD;  Location: [facility];  Service: Cardiovascular;  Laterality: N/A;  . COLONOSCOPY WITH PROPOFOL N/A 03/03/2015   Procedure: COLONOSCOPY WITH PROPOFOL;  Surgeon: Garlan Fair, MD;  Location: [facility] ENDOSCOPY;  Service: Gastroenterology;  Laterality: N/A;  . double j stent placement  06/2004   left  . ESOPHAGOGASTRODUODENOSCOPY (EGD) WITH PROPOFOL N/A 03/03/2015   Procedure: ESOPHAGOGASTRODUODENOSCOPY (EGD) WITH PROPOFOL;  Surgeon: Garlan Fair, MD;  Location: [facility];  Service: Gastroenterology;  Laterality: N/A;  . ESOPHAGOGASTRODUODENOSCOPY (EGD) WITH PROPOFOL N/A 10/25/2016   Procedure: ESOPHAGOGASTRODUODENOSCOPY (EGD) WITH PROPOFOL;  Surgeon: Garlan Fair, MD;  Location: [facility] ENDOSCOPY;  Service: Endoscopy;  Laterality: N/A;  . INGUINAL HERNIA REPAIR     "as a child; ? right"  . INGUINAL HERNIA REPAIR Left 06/23/2016   Procedure: OPEN LEFT INGUINAL HERNIA  REPAIR WITH MESH;  Surgeon: Johnathan Hausen, MD;  Location: [facility];  Service: General;  Laterality: Left;  . INSERTION OF MESH Left 06/23/2016   Procedure: INSERTION OF MESH;  Surgeon: Johnathan Hausen, MD;  Location: [facility];  Service: General;  Laterality: Left;  . LEFT HEART CATH AND CORONARY ANGIOGRAPHY N/A 07/25/2018   Procedure: LEFT HEART CATH AND CORONARY ANGIOGRAPHY;  Surgeon: Belva Crome, MD;  Location: [facility] CV LAB;  Service: Cardiovascular;  Laterality: N/A;  . LUNG BIOPSY  1993  . NASAL SEPTOPLASTY W/ TURBINOPLASTY  07/2006  . prostate biopsy  2011  . schwannoma removal  ~01/2010   left thorax  . TEE WITHOUT CARDIOVERSION N/A 09/27/2012   Procedure:  TRANSESOPHAGEAL ECHOCARDIOGRAM (TEE);  Surgeon: Larey Dresser, MD;  Location: [facility];  Service: Cardiovascular;  Laterality: N/A;  . ULTRASOUND GUIDANCE FOR VASCULAR ACCESS  07/25/2018   Procedure: Ultrasound Guidance For Vascular Access;  Surgeon: Belva Crome, MD;  Location: [facility] CV LAB;  Service: Cardiovascular;;  . UMBILICAL HERNIA REPAIR  07/2006    Social History   Socioeconomic History  . Marital status: Married    Spouse name: Not on file  . Number of children: Not on file  . Years of education: Not on file  . Highest education level: Not on file  Occupational History  . Not on file  Tobacco Use  . Smoking status: Never Smoker  . Smokeless tobacco: Never Used  Substance and Sexual Activity  . Alcohol use: Yes    Alcohol/week: 2.0 standard drinks    Types: 2 Shots of liquor per week    Comment: occasional, 05-11-2016 Per pt 3 drinks per wk  . Drug use: No    Comment: 05-11-2016 per pt no  . Sexual activity: Yes  Other Topics Concern  . Not on file  Social History Narrative   ** Merged History Encounter **       ENT physician in [place].   Social Determinants of Health   Financial Resource Strain:   . Difficulty of Paying Living Expenses: Not on file  Food Insecurity:   . Worried About Charity fundraiser in the Last Year: Not on file  . Ran Out of Food in the Last Year: Not on file  Transportation Needs:   . Lack of Transportation (Medical): Not on file  . Lack of Transportation (Non-Medical): Not on file  Physical Activity: Sufficiently Active  . Days of Exercise per Week: 7 days  . Minutes of Exercise per Session: 40 min  Stress: No Stress Concern Present  . Feeling of Stress : Not at all  Social Connections: Unknown  . Frequency of Communication with Friends and Family: Patient refused  . Frequency of Social Gatherings with Friends and Family: Patient refused  . Attends Religious Services: Patient refused  . Active Member of Clubs or  Organizations: Patient refused  . Attends Archivist Meetings: Patient refused  . Marital Status: Married  Human resources officer Violence: Not At Risk  . Fear  of Current or Ex-Partner: No  . Emotionally Abused: No  . Physically Abused: No  . Sexually Abused: No     Allergies  Allergen Reactions  . Aldactone [Spironolactone]     Breast tissue enlargement  . Codeine Other (See Comments)    Headache   . Lisinopril Cough  . Nexium [Esomeprazole Magnesium] Diarrhea  . Sulfa Antibiotics Rash  . Sulfa Drugs Cross Reactors Rash     Outpatient Medications Prior to Visit  Medication Sig Dispense Refill  . allopurinol (ZYLOPRIM) 100 MG tablet Take 100 mg by mouth 2 (two) times daily.     Marland Kitchen apixaban (ELIQUIS) 5 MG TABS tablet Take 1 tablet (5 mg total) by mouth 2 (two) times daily. 180 tablet 3  . Ascorbic Acid (VITAMIN C) 1000 MG tablet Take 1,000 mg by mouth daily.    Marland Kitchen atorvastatin (LIPITOR) 10 MG tablet Take 10 mg by mouth every evening.     Marland Kitchen CIALIS 20 MG tablet Take 20 mg by mouth daily as needed for erectile dysfunction.   11  . diltiazem (CARDIZEM CD) 360 MG 24 hr capsule Take 360 mg by mouth every evening.  7  . eplerenone (INSPRA) 50 MG tablet Take 50 mg by mouth 2 (two) times daily.    . Fluticasone-Umeclidin-Vilant (TRELEGY ELLIPTA) 100-62.5-25 MCG/INH AEPB Inhale 1 puff into the lungs daily. 28 each 5  . Glucosamine HCl (GLUCOSAMINE PO) Take by mouth.    . hydrochlorothiazide (MICROZIDE) 12.5 MG capsule Take 12.5 mg by mouth daily.    Marland Kitchen loperamide (IMODIUM A-D) 2 MG tablet Take 2 mg by mouth 2 (two) times daily.    Marland Kitchen losartan (COZAAR) 100 MG tablet Take 100 mg by mouth daily.    Marland Kitchen MAGNESIUM PO Take 1 capsule by mouth daily.    . Multiple Vitamins-Minerals (ZINC PO) Take 1 tablet by mouth 2 (two) times daily.    Marland Kitchen omeprazole (PRILOSEC) 40 MG capsule Take 40 mg by mouth daily before supper.     Marland Kitchen oxybutynin (DITROPAN-XL) 10 MG 24 hr tablet Take 10 mg by mouth daily.    .  Potassium Gluconate 2.5 MEQ TABS Take 3 tablets by mouth 2 (two) times daily.    . sertraline (ZOLOFT) 50 MG tablet Take 1 tablet (50 mg total) by mouth every evening. (Patient taking differently: Take 100 mg by mouth every evening. 100mg  daily) 90 tablet 1  . vitamin B-12 (CYANOCOBALAMIN) 1000 MCG tablet Take 1,000 mcg by mouth daily.     No facility-administered medications prior to visit.    Review of Systems  Constitutional: Positive for malaise/fatigue. Negative for chills, fever and weight loss.  HENT: Negative for hearing loss, sore throat and tinnitus.   Eyes: Negative for blurred vision and double vision.  Respiratory: Positive for shortness of breath. Negative for cough, hemoptysis, sputum production, wheezing and stridor.   Cardiovascular: Negative for chest pain, palpitations, orthopnea, leg swelling and PND.  Gastrointestinal: Negative for abdominal pain, constipation, diarrhea, heartburn, nausea and vomiting.  Genitourinary: Negative for dysuria, hematuria and urgency.  Musculoskeletal: Negative for joint pain and myalgias.  Skin: Negative for itching and rash.  Neurological: Negative for dizziness, tingling, weakness and headaches.  Endo/Heme/Allergies: Negative for environmental allergies. Does not bruise/bleed easily.  Psychiatric/Behavioral: Negative for depression. The patient is nervous/anxious. The patient does not have insomnia.   All other systems reviewed and are negative.    Objective:  Physical Exam Vitals reviewed.  Constitutional:      General: He is  not in acute distress.    Appearance: He is well-developed.  HENT:     Head: Normocephalic and atraumatic.  Eyes:     General: No scleral icterus.    Conjunctiva/sclera: Conjunctivae normal.     Pupils: Pupils are equal, round, and reactive to light.  Neck:     Vascular: No JVD.     Trachea: No tracheal deviation.  Cardiovascular:     Rate and Rhythm: Normal rate and regular rhythm.     Heart sounds:  Normal heart sounds. No murmur.  Pulmonary:     Effort: Pulmonary effort is normal. No tachypnea, accessory muscle usage or respiratory distress.     Breath sounds: Normal breath sounds. No stridor. No wheezing, rhonchi or rales.  Abdominal:     Palpations: Abdomen is soft.  Musculoskeletal:        General: No tenderness.     Cervical back: Neck supple.     Right lower leg: Edema present.     Left lower leg: Edema present.     Comments: Trace bilateral lower extremity edema left greater than right  Lymphadenopathy:     Cervical: No cervical adenopathy.  Skin:    General: Skin is warm and dry.     Capillary Refill: Capillary refill takes less than 2 seconds.     Findings: No rash.  Neurological:     Mental Status: He is alert and oriented to person, place, and time.  Psychiatric:        Behavior: Behavior normal.      Vitals:   08/22/19 1641  BP: 124/76  Pulse: 78  SpO2: 96%  Weight: 239 lb 6.4 oz (108.6 kg)  Height: 6' (1.829 m)   96% on  RA BMI Readings from Last 3 Encounters:  08/22/19 32.47 kg/m  07/11/19 31.19 kg/m  06/04/19 32.69 kg/m   Wt Readings from Last 3 Encounters:  08/22/19 239 lb 6.4 oz (108.6 kg)  07/11/19 230 lb (104.3 kg)  06/04/19 241 lb (109.3 kg)    Hematologic labs: All completed 03/15/2019:  Antithrombin III, protein C, protein S: Negative Lupus anticoagulant panel: DRV VT mildly elevated 49.5 Beta-2 glycoprotein: Negative Homocystine: Normal level Factor V Leiden: Negative for mutation Prothrombin gene mutation: Negative Cardiolipin antibodies: Negative Separate DRVVT mixed study: Within normal limits  Chest Imaging:  05/31/2018 ventilation perfusion scan: Low probability 05/31/2018 chest x-ray: No acute abnormalities  10/25/2018: High-resolution CT chest No evidence of interstitial lung disease resolution of right lung base opacity seen previously, left upper lobe lung nodule The patient's images have been independently reviewed  by me.    03/14/2019: CTA chest Bilateral lower lobe acute pulmonary emboli with evidence of right ventricular strain RV LV ratio 1.2. Left upper lobe 5 mm sub-solid pulmonary nodule with groundglass recommending 46-month follow-up. The patient's images have been independently reviewed by me.     Pulmonary Functions Testing Results: PFT Results Latest Ref Rng & Units 08/29/2018  FVC-Pre L 3.38  FVC-Predicted Pre % 63  FVC-Post L 3.27  FVC-Predicted Post % 61  Pre FEV1/FVC % % 67  Post FEV1/FCV % % 68  FEV1-Pre L 2.28  FEV1-Predicted Pre % 56  FEV1-Post L 2.22  DLCO UNC% % 81  DLCO COR %Predicted % 109  TLC L 6.63  TLC % Predicted % 84  RV % Predicted % 113    FeNO: None   Pathology: None   Echocardiogram:  09/27/2012 TEE: Normal ejection fraction 55 to 60% no PFO no LV  thrombus LA thrombus 07/18/2018 transthoracic echo: Normal ejection fraction 50 to 55%  Heart Catheterization:   07/25/2018:  Left dominant coronary anatomy  Normal left main  Normal LAD  Normal circumflex  Nondominant normal right coronary  Normal LVEDP, 16 mmHg.  Echocardiogram done several days ago documented EF 55%.  Abnormal EKG does not reflect prior infarction/ischemic injury to the heart. RECOMMENDATIONS:  No further ischemic evaluation  Current EKG will be the patient's EKG pattern going forward and has no ischemic basis.  Resume Xarelto 07/26/2018.    Assessment & Plan:   DOE (dyspnea on exertion)  Stage 2 moderate COPD by GOLD classification ([place])  On continuous oral anticoagulation  Chronic obstructive pulmonary disease, unspecified COPD type ([place])  History of pulmonary embolism  Discussion:  [name], occasional cigar use, lifelong noncigarette smoker.  Pulmonary function test with moderate obstructive disease ratio of 68, FEV1 56% currently managed with Trelegy inhaler.  Prior left upper lobe pulmonary nodule with groundglass.  Follow-up CT imaging  was ordered but not yet completed, however lung nodule was small 5 mm in size.  Prior pulmonary infiltrate resolved.  Acute bilateral submassive PE occurred in August 2020 currently on anticoagulation, hypercoagulability work-up negative.  Patient has persistent dyspnea symptoms.  Despite patient's abnormal PFTs we have had no other clear reason for patient's ongoing dyspnea.  Patient has been referred to [facility] for evaluation by Dr. Corrin Parker.  We discussed some questions and pertinent information today in the office to make sure to ask and discuss next week with his consultation with Dr. Corrin Parker.  One question I have would be the utility of cardiopulmonary exercise testing for further evaluation of his dyspnea.  In the meantime patient should continue his current Trelegy inhaler.  Continue Eliquis.  Patient to follow-up in our clinic in 6 months or as needed based on symptoms.  Greater than 50% of this patient's 35-minute of visit was spent face-to-face discussing above recommendations and treatment plan.   Garner Nash, DO [facility] 08/22/2019 4:51 PM

## 2019-08-22 NOTE — Patient Instructions (Addendum)
Thank you for visiting Dr. Valeta Harms at [facility]. Today we recommend the following:  I will follow up with [facility] recommendations.   Return in about 6 months (around 02/19/2020).    Please do your part to reduce the spread of COVID-19.

## 2019-08-23 ENCOUNTER — Ambulatory Visit (HOSPITAL_COMMUNITY)
Admission: RE | Admit: 2019-08-23 | Discharge: 2019-08-23 | Disposition: A | Discharge status: Home / Self Care | Payer: PRIVATE HEALTH INSURANCE | Source: Ambulatory Visit | Attending: Internal Medicine | Admitting: Internal Medicine

## 2019-08-23 DIAGNOSIS — R0602 Shortness of breath: Secondary | ICD-10-CM

## 2019-08-23 DIAGNOSIS — I480 Paroxysmal atrial fibrillation: Secondary | ICD-10-CM

## 2019-08-23 LAB — BASIC METABOLIC PANEL
Anion gap: 9 (ref 5–15)
BUN: 29 mg/dL — ABNORMAL HIGH (ref 8–23)
CO2: 23 mmol/L (ref 22–32)
Calcium: 9 mg/dL (ref 8.9–10.3)
Chloride: 105 mmol/L (ref 98–111)
Creatinine, Ser: 1.5 mg/dL — ABNORMAL HIGH (ref 0.61–1.24)
GFR calc Af Amer: 56 mL/min — ABNORMAL LOW (ref >60)
GFR calc non Af Amer: 48 mL/min — ABNORMAL LOW (ref >60)
Glucose, Bld: 116 mg/dL — ABNORMAL HIGH (ref 70–99)
Potassium: 3.6 mmol/L (ref 3.5–5.1)
Sodium: 137 mmol/L (ref 135–145)

## 2019-08-23 MED ORDER — SODIUM CHLORIDE 0.9 % WEIGHT BASED INFUSION
3.0000 mL/kg/h | INTRAVENOUS | Status: AC
  Administered 2019-08-23: 10:00:00 3 mL/kg/h via INTRAVENOUS

## 2019-08-23 MED ORDER — SODIUM CHLORIDE 0.9 % WEIGHT BASED INFUSION: 1.0000 mL/kg/h | INTRAVENOUS | Status: DC

## 2019-08-23 MED ORDER — IOHEXOL 350 MG/ML SOLN
100.0000 mL | Freq: Once | INTRAVENOUS | Status: AC | PRN
  Administered 2019-08-23: 100 mL via INTRAVENOUS

## 2019-08-31 ENCOUNTER — Other Ambulatory Visit: Payer: Self-pay

## 2019-08-31 ENCOUNTER — Inpatient Hospital Stay: Payer: PRIVATE HEALTH INSURANCE | Attending: Hematology | Admitting: Hematology

## 2019-08-31 ENCOUNTER — Telehealth: Payer: Self-pay | Admitting: Pulmonary Disease

## 2019-08-31 VITALS — BP 141/83 | HR 65 | Temp 97.8°F | Resp 18 | Ht 72.0 in | Wt 241.4 lb

## 2019-08-31 DIAGNOSIS — R0600 Dyspnea, unspecified: Secondary | ICD-10-CM | POA: Diagnosis not present

## 2019-08-31 DIAGNOSIS — M7989 Other specified soft tissue disorders: Secondary | ICD-10-CM | POA: Diagnosis not present

## 2019-08-31 DIAGNOSIS — Z86718 Personal history of other venous thrombosis and embolism: Secondary | ICD-10-CM | POA: Diagnosis not present

## 2019-08-31 DIAGNOSIS — I10 Essential (primary) hypertension: Secondary | ICD-10-CM | POA: Diagnosis not present

## 2019-08-31 DIAGNOSIS — I82412 Acute embolism and thrombosis of left femoral vein: Secondary | ICD-10-CM

## 2019-08-31 DIAGNOSIS — Z79899 Other long term (current) drug therapy: Secondary | ICD-10-CM | POA: Diagnosis not present

## 2019-08-31 DIAGNOSIS — M19049 Primary osteoarthritis, unspecified hand: Secondary | ICD-10-CM | POA: Insufficient documentation

## 2019-08-31 DIAGNOSIS — Z6832 Body mass index (BMI) 32.0-32.9, adult: Secondary | ICD-10-CM | POA: Diagnosis not present

## 2019-08-31 DIAGNOSIS — Z8249 Family history of ischemic heart disease and other diseases of the circulatory system: Secondary | ICD-10-CM | POA: Insufficient documentation

## 2019-08-31 DIAGNOSIS — I2692 Saddle embolus of pulmonary artery without acute cor pulmonale: Secondary | ICD-10-CM | POA: Insufficient documentation

## 2019-08-31 DIAGNOSIS — I2602 Saddle embolus of pulmonary artery with acute cor pulmonale: Secondary | ICD-10-CM | POA: Diagnosis not present

## 2019-08-31 DIAGNOSIS — J449 Chronic obstructive pulmonary disease, unspecified: Secondary | ICD-10-CM

## 2019-08-31 DIAGNOSIS — Z7901 Long term (current) use of anticoagulants: Secondary | ICD-10-CM | POA: Diagnosis not present

## 2019-08-31 NOTE — Telephone Encounter (Signed)
[facility] pulmonary rehab referral ordered. Nothing further is needed.

## 2019-08-31 NOTE — Telephone Encounter (Signed)
PCCM: Yes, please refer to pulmonary rehab. I agree. [facility] 08/31/2019 3:46 PM

## 2019-08-31 NOTE — Telephone Encounter (Signed)
Spoke with pt, he is asking Dr. Valeta Harms for a referral for pulmonary rehab. I did mention that it was virtual now due to Covid. Pt understood. Can we send an order for pulmonary rehab? Please advise.

## 2019-08-31 NOTE — Progress Notes (Signed)
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 Pt is accompanied today by his wife Casey [name], via phone. The pt reports that he is doing well overall.  The pt reports that he experienced his DVT/PE on 03/14/2019. A few days before this he was on a four-hour car ride home from vacation where he was able to take multiple breaks to stretch his legs. He does note that he had pain in his left leg during this time which continued for the next few days. During their vacation they walked on the beach and rode their bikes but did not do any physical activity out of the ordinary. 2-3 days later he went to sign up for an elective tonsillectomy and began to feel like he was going to faint. He was rushed to the hospital and they found the DVT/PE. He reports multiple respiratory illnesses in the past few years but had no other chest pains or tightness before this event.   Pt had a Superficial Thrombophlebitis clot last year after a long flight to [place]. He was on anti-coagulation for 6 months after that. He also took Coumadin a couple of years ago for intermittent Atrial Fibrillation. He has no issues with Afib in years.   Pt had Pneumonia in January. He had a fever, was treated with inhalers and steroids and needed to be hospitalized. He reports that they never cultured anything from his infection and does not remember being given antibiotics. He is still unsure if it was a bacterial Pneumonia. Pt prefers Trelegy to  Albuterol but cannot fully attribute his improved breathing to Trelegy. His breathing could simply have improved with time. He was not placed on high-dose steroids to treat. Pt had another respiratory infection previously where he was in the hospital 3 weeks and was never given a diagnosis. There was concern for Hanta Virus. He did not have to intubated at any point during this hospitalization.   Pt is currently on anti-coagulation therapy. All of the testing that was done in the hospital was in the setting of the pt being on Heparin. He does report some pain/soreness in his left leg. He is on a low-dose of Potassium replacement and eats a banana everyday. Pt elevates his leg at night as he can't wear his compression socks all day and does not note that his calf pain is worse at the end of the day. He has no history of Varicose veins but does get some pedal edema when standing for long periods of time and some lower leg discoloration with Veinous Stasis. 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 Pt has uric acid kidney stones and is taking Allopurinol preventatively. He reports that he had his first kidney stone when he was a resident but hasn't had problems since. There have been mulitple cysts found on his kidney, for which he has had genetic testing done in [place]. Pt has acid reflux and according  to his GI, concern for slow gastric emptying. Pt has had some prostatic changes (potential chronic prostatitis) and some issues with complete bowel emptying which has been managed with Miralax. No definitive answer on the cause of his previous bladder & bowel incontinence. Pt gets regular colonoscopies, which have revealed occasional benign polyps. He had one last year, which revealed no significant findings and has been advised to repeat in 10 years. He has recently been  experiencing some hard stool since beginning Imodium, where his stool used to have a more runny consistency. Pt has been wearing compression socks on long journeys or flights and is now wearing medical-grade compression socks. Pt notes minimal hand arthritis but no other joint swelling or issues. He says that he has been sleeping more in the last 6 months, due to his wife's encouragement and feels that it has helped his energy levels slighty. Pt reports some kyphosis. He also plays the tuba and states that he does not have any issues getting a full breath while playing. His breathing has improved recently, he feels better than he has in awhile. His wife notes that in the last few years pt becomes winded and SOB more easily. She states that pt was able to ride 30 miles on his bike, hike and walk long distances with no issues. Pt has never been on Testosterone replacement and he states that his C-reactive Protein has been high for a long time. Pt has been doing shorter surgeries and is planning on retiring in 6 months.   His father has a history of blood clots as well, with at least 2 reported incidents. His mother had a history of Atrial Fibrillation. Both parents are deceased now. His father was in his 39's when he passed but he had a respiratory-lung disease that was never diagnosed, but Interstitial lung disease was r/o. He began taking oxygen when he was 75. Pt's sister has Lymphedema but does not have Lupus. He has a son and a daughter. His daughter has Fredderick Phenix Danlos Syndrome but did not have any issues with blood clots during 3 pregnancies. He is worried about passing along an unfavorable genetic traits to his children or grandchildren.  Of note prior to the patient's visit today, pt has had [place] Lower Extremity Venous completed on 03/14/2019 with results revealing "Right: No evidence of common femoral vein obstruction. Left: Findings consistent with acute deep vein thrombosis involving the left femoral vein,  left popliteal vein, left posterior tibial veins, and left gastrocnemius veins. No cystic structure found in the popliteal fossa."  Pt has had ECHO completed on 04/30/2019 with results revealing "1. Left ventricular ejection fraction, by visual estimation, is 60 to 65%. The left ventricle has normal function. Normal left ventricular size. There is mildly increased left ventricular hypertrophy. 2. Left ventricular diastolic Doppler parameters are indeterminate pattern of LV diastolic filling. 3. Global right ventricle has normal systolic function.The right ventricular size is mildly enlarged. No increase in right ventricular wall thickness. 4. Left atrial size was mildly dilated. 5. Right atrial size was mildly dilated. 6. Mild aortic valve annular calcification. 7. Mild mitral annular calcification. 8. The mitral valve is normal in structure. No evidence of mitral valve regurgitation. No evidence of mitral stenosis. 9. The tricuspid valve is normal in structure. Tricuspid valve regurgitation is mild. 10. The aortic valve is normal in structure. Aortic valve regurgitation was not visualized by color [name] Doppler. Mild aortic valve sclerosis without stenosis. 11. The pulmonic valve was normal in structure.  Pulmonic valve regurgitation is not visualized by color [name] Doppler. 12. Aortic dilatation noted. 13. There is mild dilatation of the aortic root measuring 42 mm. 14. Mildly elevated pulmonary artery systolic pressure. 15. The inferior vena cava is normal in size with greater than 50% respiratory variability, suggesting right atrial pressure of 3 mmHg."   Most recent lab results (03/17/2019) of CBC & BMP is as follows: WBC at 9.9K, RBC at 4.43, Hgb at 12.6, HCT at 39.3, MCV at 88.7, MCH at 28.4, MCHC at 32.1, RDW at 15.5, PLTs at 185K, nRBC at 0.0, Sodium at 140, Potassium at 3.0, Chloride at 103, CO2 at 26, Glucose at 90, BUN at 19, Creatinine at 1.51, Calcium at 8.8, GFR Est Non Af Am at 48, Anion Gap at 11.    03/15/2019 dRVVT Mix at 40.0  On review of systems, pt reports improved breathing, constipation, left calf pain and denies issues swallowing, fevers, chills, night sweats, headaches, changes in vision, abdominal pain, diarrhea and any other symptoms.   On PMHx the pt reports Atrial Fibrillation, Pneumonia, PE, DVT, Kidney cysts , Kidney stones. On Family Hx the pt reports that his father had a history of blood clots (treated by Coumadin), his mother had Atrial Fibrillation and Polycystic kidneys and his sister has Lymphedema.   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 The patient's last visit with [place] was on 04/30/2019. The pt reports that he is doing well overall.  The pt reports he still has shortness of breath. He does not have any chest pain. His peak heart rate is around 85. Has been having an additional w/u with Pulmonologist at [facility].  He is tolerating Eliquis without any bleeding issues.  He has some leg swelling. He notices it swells more when standing for long periods of time or if he eats a lot of salt.  He is wearing his compression socks.  He is still working full time. He retires in March.  No new concerns over the past few months   Of note since the patient's last visit, pt has had CT CORONARY MORPH W/CTA COR W/SCORE W/CA W/CM &/OR WO/CM (Accession [id number]) completed on 08/22/2018 with results revealing "1. There is normal pulmonary vein drainage into the left atrium. No pulmonary vein stenosis. No significant change in dimensions compared to the report of CT from 09/18/2012. 2. Normal left atrial appendage, no left atrial appendage thrombus. No intracardiac mass or thrombus. 3. Dilated ascending aorta, 45 mm at Sinus of Valsalva (R-L) and 42 mm at mid ascending aorta. 4. Dilated main pulmonary artery, 42 mm. 5. Coronary artery calcium  score 174, which is 65th percentile for age and sex matched peers.  6.Dilatation of the pulmonic trunk (4.3 cm in diameter), which could suggest pulmonary arterial hypertension. Further clinical evaluation is recommended."  Lab results today (08/23/2019) of CBC w/diff and CMP is as follows: all values are WNL except for Glucose Bld at 116, BUN at 29, Creatinine Ser at 1.50, GFR calc non Af Amer at 48, GFR calc Af Amer at 56.  On review of systems, pt reports no new concerns and denies abdominal pain, body pain, and any other symptoms.    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[place])    ongoing evaluation for elevated R>L adosterone secretion   Arthritis    Atrial  enlargement, bilateral    Atrial fibrillation (H[facility])    paroxysmal   Atrial flutter (HCC)    Complication of anesthesia    could not urinate after UHR, needed cath, mental fog sinc cervical fusion 08-09-16    Dysrhythmia    a-fib-had ablation x2   GERD (gastroesophageal reflux disease)    Hyperaldosteronism (HCC)    Hypertension    Kidney cysts    "multiple"   Nephrolithiasis    Pneumonia 1990's   Pre-diabetes    Prostatitis    Renal insufficiency    "Cr 1.6"    SURGICAL HISTORY: Past Surgical History:  Procedure Laterality Date   ABLATION     ATRIAL FIBRILLATION ABLATION N/A 09/28/2012   Procedure: ATRIAL FIBRILLATION ABLATION;  Surgeon: Thompson Grayer, MD;  Location: [facility] CATH LAB;  Service: Cardiovascular;  Laterality: N/A;   atrial fibrillation ablation with CTI ablation  05/27/11, 09/28/12   ablation for afib and atrial flutter by Dr Norman Herrlich SURGERY  08/09/2016   C 5 6  C 6 to C 7 cervical fusion   CARDIOVERSION  07/28/2011   Procedure: CARDIOVERSION;  Surgeon: Lelon Perla, MD;  Location: [place];  Service: Cardiovascular;  Laterality: N/A;   COLONOSCOPY WITH PROPOFOL N/A 03/03/2015   Procedure: COLONOSCOPY WITH PROPOFOL;  Surgeon: Garlan Fair, MD;  Location: [facility];  Service: Gastroenterology;  Laterality: N/A;   double j stent placement  06/2004   left   ESOPHAGOGASTRODUODENOSCOPY (EGD) WITH PROPOFOL N/A 03/03/2015   Procedure: ESOPHAGOGASTRODUODENOSCOPY (EGD) WITH PROPOFOL;  Surgeon: Garlan Fair, MD;  Location: [facility] ENDOSCOPY;  Service: Gastroenterology;  Laterality: N/A;   ESOPHAGOGASTRODUODENOSCOPY (EGD) WITH PROPOFOL N/A 10/25/2016   Procedure: ESOPHAGOGASTRODUODENOSCOPY (EGD) WITH PROPOFOL;  Surgeon: Garlan Fair, MD;  Location: [facility] ENDOSCOPY;  Service: Endoscopy;  Laterality: N/A;   INGUINAL HERNIA REPAIR     "as a child; ? right"   INGUINAL HERNIA REPAIR Left 06/23/2016   Procedure: OPEN LEFT INGUINAL HERNIA  REPAIR WITH MESH;  Surgeon: Johnathan Hausen, MD;  Location: [place];  Service: General;  Laterality: Left;   INSERTION OF MESH Left 06/23/2016   Procedure: INSERTION OF MESH;  Surgeon: Johnathan Hausen, MD;  Location: [place];  Service: General;  Laterality: Left;   LEFT HEART CATH AND CORONARY ANGIOGRAPHY N/A 07/25/2018   Procedure: LEFT HEART CATH AND CORONARY ANGIOGRAPHY;  Surgeon: Belva Crome, MD;  Location: [place] CV LAB;  Service: Cardiovascular;  Laterality: N/A;   LUNG BIOPSY  1993   NASAL SEPTOPLASTY W/ TURBINOPLASTY  07/2006   prostate biopsy  2011   schwannoma removal  ~01/2010   left thorax   TEE WITHOUT CARDIOVERSION N/A 09/27/2012   Procedure: TRANSESOPHAGEAL ECHOCARDIOGRAM (TEE);  Surgeon: Larey Dresser, MD;  Location: [place] [facility];  Service: Cardiovascular;  Laterality: N/A;   ULTRASOUND GUIDANCE FOR VASCULAR ACCESS  07/25/2018   Procedure: Ultrasound Guidance For Vascular Access;  Surgeon: Belva Crome, MD;  Location: [place] [facility];  Service: Cardiovascular;;   UMBILICAL HERNIA REPAIR  07/2006    SOCIAL HISTORY: Social History   Socioeconomic History   Marital status: Married    Spouse name: Not on file   Number of  children: Not on file   Years of education: Not on file   Highest education level: Not on file  Occupational History   Not on file  Tobacco Use   Smoking status: Never Smoker   Smokeless tobacco: Never Used  Substance and Sexual Activity   Alcohol use: Yes    Alcohol/week: 2.0 standard drinks    Types: 2 Shots of liquor per week    Comment: occasional, 05-11-2016 Per pt 3 drinks per wk   Drug use: No    Comment: 05-11-2016 per pt no   Sexual activity: Yes  Other Topics Concern   Not on file  Social History Narrative   ** Merged History Encounter **       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   Social Determinants of Health   Financial Resource Strain:    Difficulty of Paying Living Expenses: Not on file  Food Insecurity:    Worried About Charity fundraiser in the Last Year: Not on file   [employer] of Food in the Last Year: Not on file  Transportation Needs:    Lack of Transportation (Medical): Not on file   Lack of Transportation (Non-Medical): Not on file  Physical Activity: Sufficiently Active   Days of Exercise per Week: 7 days   Minutes of Exercise per Session: 40 min  Stress: No Stress Concern Present   Feeling of Stress : Not at all  Social Connections: Unknown   Frequency of Communication with Friends and Family: Patient refused   Frequency of Social Gatherings with Friends and Family: Patient refused   Attends Religious Services: Patient refused   Marine scientist or Organizations: Patient refused   Attends Music therapist: Patient refused   Marital Status: Married  Human resources officer Violence: Not At Risk   Fear of Current or Ex-Partner: No   Emotionally Abused: No   Physically Abused: No   Sexually Abused: No    FAMILY HISTORY: Family History  Problem Relation Age of Onset   Atrial fibrillation Other        sister has had 2 afib ablations   Atrial fibrillation Sister     ALLERGIES:  is allergic to aldactone  [spironolactone]; codeine; lisinopril; nexium [esomeprazole magnesium]; sulfa antibiotics; and sulfa drugs cross reactors.  MEDICATIONS:  Current Outpatient Medications  Medication Sig Dispense Refill   allopurinol (ZYLOPRIM) 100 MG tablet Take 100 mg by mouth 2 (two) times daily.      apixaban (ELIQUIS) 5 MG TABS tablet Take 1 tablet (5 mg total) by mouth 2 (two) times daily. 180 tablet 3   Ascorbic Acid (VITAMIN C) 1000 MG tablet Take 1,000 mg by mouth daily.     atorvastatin (LIPITOR) 10 MG tablet Take 10 mg by mouth every evening.      CIALIS 20 MG tablet Take 20 mg by mouth daily as needed for erectile dysfunction.   11   diltiazem (CARDIZEM CD) 360 MG 24 hr capsule Take 360 mg by mouth every evening.  7   eplerenone (INSPRA) 50 MG tablet Take 50 mg by mouth 2 (two) times daily.     Fluticasone-Umeclidin-Vilant (TRELEGY ELLIPTA) 100-62.5-25 MCG/INH AEPB Inhale 1 puff into the lungs daily. 28 each 5   Glucosamine HCl (GLUCOSAMINE PO) Take by mouth.     hydrochlorothiazide (MICROZIDE) 12.5 MG capsule Take 12.5 mg by mouth daily.     loperamide (IMODIUM A-D) 2 MG tablet Take 2 mg by mouth 2 (two) times daily.     losartan (COZAAR) 100 MG tablet Take 100 mg by mouth daily.     MAGNESIUM PO Take 1 capsule by mouth daily.     Multiple Vitamins-Minerals (ZINC PO) Take 1 tablet by mouth 2 (two) times daily.  omeprazole (PRILOSEC) 40 MG capsule Take 40 mg by mouth daily before supper.      oxybutynin (DITROPAN-XL) 10 MG 24 hr tablet Take 10 mg by mouth daily.     Potassium Gluconate 2.5 MEQ TABS Take 3 tablets by mouth 2 (two) times daily.     sertraline (ZOLOFT) 50 MG tablet Take 1 tablet (50 mg total) by mouth every evening. (Patient taking differently: Take 100 mg by mouth every evening. 100mg  daily) 90 tablet 1   vitamin B-12 (CYANOCOBALAMIN) 1000 MCG tablet Take 1,000 mcg by mouth daily.     No current facility-administered medications for this visit.    REVIEW OF  SYSTEMS:   A 10+ POINT REVIEW OF SYSTEMS WAS OBTAINED including neurology, dermatology, psychiatry, cardiac, respiratory, lymph, extremities, GI, GU, Musculoskeletal, constitutional, breasts, reproductive, HEENT.  All pertinent positives are noted in the HPI.  All others are negative.    PHYSICAL EXAMINATION: ECOG FS:1 - Symptomatic but completely ambulatory  Vitals:   08/31/19 0919  BP: (!) 141/83  Pulse: 65  Resp: 18  Temp: 97.8 F (36.6 C)  SpO2: 96%   Wt Readings from Last 3 Encounters:  08/31/19 241 lb 6.4 oz (109.5 kg)  08/23/19 235 lb (106.6 kg)  08/22/19 239 lb 6.4 oz (108.6 kg)   Body mass index is 32.74 kg/m.    GENERAL:alert, in no acute distress and comfortable SKIN: no acute rashes, no significant lesions EYES: conjunctiva are pink and non-injected, sclera anicteric OROPHARYNX: MMM, no exudates, no oropharyngeal erythema or ulceration NECK: supple, no JVD LYMPH:  no palpable lymphadenopathy in the cervical, axillary or inguinal regions LUNGS: clear to auscultation b/l with normal respiratory effort HEART: regular rate & rhythm ABDOMEN:  normoactive bowel sounds , non tender, not distended. Extremity: no pedal edema PSYCH: alert & oriented x 3 with fluent speech NEURO: no focal motor/sensory deficits   LABORATORY DATA:  I have reviewed the data as listed  . CBC Latest Ref Rng & Units 04/30/2019 03/17/2019 03/16/2019  WBC 4.0 - 10.5 K/uL 9.9 9.9 9.4  Hemoglobin 13.0 - 17.0 g/dL 14.4 12.6(L) 12.9(L)  Hematocrit 39.0 - 52.0 % 44.5 39.3 39.4  Platelets 150 - 400 K/uL 250 185 161    . CMP Latest Ref Rng & Units 08/23/2019 08/20/2019 04/30/2019  Glucose 70 - 99 mg/dL 116(H) 78 90  BUN 8 - 23 mg/dL 29(H) 30(H) 31(H)  Creatinine 0.61 - 1.24 mg/dL 1.50(H) 1.70(H) 1.58(H)  Sodium 135 - 145 mmol/L 137 141 141  Potassium 3.5 - 5.1 mmol/L 3.6 4.2 3.8  Chloride 98 - 111 mmol/L 105 106 107  CO2 22 - 32 mmol/L 23 28 24   Calcium 8.9 - 10.3 mg/dL 9.0 9.8 9.5  Total  Protein 6.5 - 8.1 g/dL - - 7.3  Total Bilirubin 0.3 - 1.2 mg/dL - - 0.7  Alkaline Phos 38 - 126 U/L - - 75  AST 15 - 41 U/L - - 23  ALT 0 - 44 U/L - - 21   Component     Latest Ref Rng & Units 04/30/2019  IgG (Immunoglobin G), Serum     603 - 1,613 mg/dL 1,174  IgA     61 - 437 mg/dL 286  IgM (Immunoglobulin M), Srm     20 - 172 mg/dL 56  Total Protein ELP     6.0 - 8.5 g/dL 6.8  Albumin SerPl Elph-Mcnc     2.9 - 4.4 g/dL 3.7  Alpha 1     0.0 -  0.4 g/dL 0.2  Alpha2 Glob SerPl Elph-Mcnc     0.4 - 1.0 g/dL 0.8  B-Globulin SerPl Elph-Mcnc     0.7 - 1.3 g/dL 1.0  Gamma Glob SerPl Elph-Mcnc     0.4 - 1.8 g/dL 1.1  M Protein SerPl Elph-Mcnc     Not Observed g/dL Not Observed  Globulin, Total     2.2 - 3.9 g/dL 3.1  Albumin/Glob SerPl     0.7 - 1.7 1.2  IFE 1      Comment  Please Note (HCV):      Comment  08/23/2019 CT CORONARY MORPH W/CTA COR W/SCORE W/CA W/CM &/OR WO/CM (Accession [id number])    JAK2 04/30/2019   08/28/2018 ECHO   03/16/2019 Heparin level    03/15/2019 dRVVT Mix    03/15/2019 Cardiolipin antibodies, IgG, IgM, IgA   03/16/2019 Prothrombin gene mutation   03/15/2019 Factor 5 leiden    03/15/2019 Homocysteine, serum    03/15/2019  Beta-2-glycoprotein I abs, IgG/M/A   03/15/2019 Lupus anticoagulant panel    03/15/2019 Total Protein S    03/15/2019 Protein S activity    03/15/2019 Total Protein C   03/15/2019 Protein C activity    08/0/2020 Antithrombin III   Component     Latest Ref Rng & Units 03/14/2019 03/15/2019 03/16/2019  Heparin Unfractionated     0.30 - 0.70 IU/mL 0.31 0.42 0.28 (L)   Component     Latest Ref Rng & Units 04/30/2019  IgG (Immunoglobin G), Serum     603 - 1,613 mg/dL 1,174  IgA     61 - 437 mg/dL 286  IgM (Immunoglobulin M), Srm     20 - 172 mg/dL 56  Total Protein ELP     6.0 - 8.5 g/dL 6.8  Albumin SerPl Elph-Mcnc     2.9 - 4.4 g/dL 3.7  Alpha 1     0.0 - 0.4 g/dL 0.2  Alpha2 Glob SerPl  Elph-Mcnc     0.4 - 1.0 g/dL 0.8  B-Globulin SerPl Elph-Mcnc     0.7 - 1.3 g/dL 1.0  Gamma Glob SerPl Elph-Mcnc     0.4 - 1.8 g/dL 1.1  M Protein SerPl Elph-Mcnc     Not Observed g/dL Not Observed  Globulin, Total     2.2 - 3.9 g/dL 3.1  Albumin/Glob SerPl     0.7 - 1.7 1.2  IFE 1      Comment  Please Note (HCV):      Comment   RADIOGRAPHIC STUDIES: I have personally reviewed the radiological images as listed and agreed with the findings in the report. CT CORONARY MORPH W/CTA COR W/SCORE W/CA W/CM &/OR WO/CM  Addendum Date: 08/25/2019   ADDENDUM REPORT: 08/25/2019 20:49 CLINICAL DATA:  Dyspnea.  S/p afib ablation x 2. EXAM: Cardiac CT/CTA TECHNIQUE: The patient was scanned on a Siemens Somatom scanner. FINDINGS: A 120 kV prospective scan was triggered in the descending thoracic aorta at 111 HU's. Gantry rotation speed was 280 msecs and collimation was .9 mm. No beta blockade and no NTG was given. The 3D data set was reconstructed in 5% intervals of the 60-80 % of the R-R cycle. Diastolic phases were analyzed on a dedicated work station using MPR, MIP and VRT modes. The patient received 80 cc of contrast. There is normal pulmonary vein drainage into the left atrium (2 on the right and 2 on the left) with ostial measurements as follows: All pulmonary veins widely patent. No anomalous pulmonary venous drainage. RUPV:  23.3 x 15.4 mm RLPV: 20.5 x 18 mm LUPV: 23 x 11.4 mm LLPV: 20.5 x 19.8 mm Cross sectional measurements do not differ significantly from the values obtained on CTA Pulmonary Vein study 09/18/2012 by comparison to report. Normal left atrial appendage, no left atrial appendage thrombus. No intracardiac mass or thrombus. Aorta: Dilated ascending aorta. R-L = 45 mm, L-NON = 43 mm, R-NON = 42 mm 42 mm at the mid ascending aorta measured double oblique. No dissection or calcifications. Aortic Valve:  Trileaflet.  Mild calcifications. Coronary Arteries: Normal coronary origin. Left dominance.  The study was performed without use of NTG and insufficient for plaque evaluation. Coronary artery calcium score 174, which is 65th percentile for age and sex matched peers. Pulmonary artery: Dilation of main pulmonary artery, 42 mm, suggests increased pulmonary artery pressure. IMPRESSION: 1. There is normal pulmonary vein drainage into the left atrium. No pulmonary vein stenosis. No significant change in dimensions compared to the report of CT from 09/18/2012. 2. Normal left atrial appendage, no left atrial appendage thrombus. No intracardiac mass or thrombus. 3. Dilated ascending aorta, 45 mm at Sinus of Valsalva (R-L) and 42 mm at mid ascending aorta. 4. Dilated main pulmonary artery, 42 mm. 5. Coronary artery calcium score 174, which is 65th percentile for age and sex matched peers. Electronically Signed   By: Cherlynn Kaiser   On: 08/25/2019 20:49   Result Date: 08/25/2019 EXAM: OVER-READ INTERPRETATION  CT CHEST The following report is an over-read performed by radiologist Dr. Vinnie Langton of [facility] Radiology, [place] on 08/23/2019. This over-read does not include interpretation of cardiac or coronary anatomy or pathology. The coronary calcium score/coronary CTA interpretation by the cardiologist is attached. COMPARISON:  None. FINDINGS: Very mild linear scarring in the periphery of the left lower lobe. Within the visualized portions of the thorax there are no suspicious appearing pulmonary nodules or masses, there is no acute consolidative airspace disease, no pleural effusions, no pneumothorax and no lymphadenopathy. Dilatation of the pulmonic trunk (4.3 cm in diameter). Visualized portions of the upper abdomen are unremarkable. Old healed left-sided rib fractures. There are no aggressive appearing lytic or blastic lesions noted in the visualized portions of the skeleton. IMPRESSION: 1. Dilatation of the pulmonic trunk (4.3 cm in diameter), which could suggest pulmonary arterial hypertension. Further  clinical evaluation is recommended. Electronically Signed: By: Vinnie Langton M.D. 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 -Discussed results from [facility] and his pulmonologist's evaluation. -Discussed ECHO on 08/28/2018 -Discussed CT  08/23/2019 - Discussed negative JAK2 mutation - no evidence of clonal MPN -discussed PAI inhibitor 4G/5G polymorphism presence with some increased inhibition of fibrinolysis as a potential indeterminate factor increasing risk of VTE or CAD. - Will continue to be on blood thinners long term given significant unprovoked DVT and PET/CT and some concerns from pulmonologist regarding chronic VTE disease.    FOLLOW UP: -RTC with Casey AyersYasuo Phimmasone as needed   The total time spent in the appt was 20 minutes and more than 50% was on counseling and direct patient cares.  All of the patient's questions were  answered with apparent satisfaction. The patient knows to call the clinic with any problems, questions or concerns.     Sullivan Lone MD [place] AAHIVMS [facility] Hematology/Oncology Physician [facility]  (Office):       [phone] (Work cell):  [phone] (Fax):           [phone]  08/31/2019 9:03 AM  I, Scot Dock, am acting as a scribe for Dr. Sullivan Lone.   .I have reviewed the above documentation for accuracy and completeness, and I agree with the above. Brunetta Genera MD

## 2019-09-04 ENCOUNTER — Encounter (HOSPITAL_COMMUNITY): Payer: Self-pay | Admitting: *Deleted

## 2019-09-04 NOTE — Progress Notes (Signed)
Received referral from Dr. Valeta Harms for this pt to participate in pulmonary rehab with the the diagnosis of COPD Stage II. Pt previously was referred March 2020 but was unfortunately unable to proceed due to departmental closure for COVID 19.  Pt has recent PFT which confirm this diagnosis.  Pt FEV1 in 2/202 showed at 55%.  Pt with two pulmonary related hospitalizations for Pneumonia in February 2020 and submassive PE in September 2020  . Clinical review of pt follow up appt on 1/27. Pulmonary office note.  Pt with Covid Risk Score - 5. Pt appropriate for scheduling for Pulmonary rehab.  Will forward to support staff for scheduling and verification of insurance eligibility/benefits with pt consent. Cherre Huger, BSN Cardiac and Training and development officer

## 2019-09-05 ENCOUNTER — Telehealth (HOSPITAL_COMMUNITY): Payer: Self-pay

## 2019-09-05 NOTE — Telephone Encounter (Signed)
Pt insurance is active and benefits verified through [employer] Co-pay 0, DED $3,000/0 met, out of pocket $6,000/$272.30 met, co-insurance 30%. no pre-authorization required, REF# [id number]

## 2019-09-06 ENCOUNTER — Ambulatory Visit (HOSPITAL_COMMUNITY): Payer: PRIVATE HEALTH INSURANCE

## 2019-09-14 ENCOUNTER — Telehealth (HOSPITAL_COMMUNITY): Payer: Self-pay

## 2019-09-24 ENCOUNTER — Other Ambulatory Visit: Payer: Self-pay | Admitting: Internal Medicine

## 2019-09-24 ENCOUNTER — Ambulatory Visit
Admission: RE | Admit: 2019-09-24 | Discharge: 2019-09-24 | Disposition: A | Discharge status: Home / Self Care | Payer: PRIVATE HEALTH INSURANCE | Source: Ambulatory Visit | Attending: Internal Medicine | Admitting: Internal Medicine

## 2019-09-24 DIAGNOSIS — R140 Abdominal distension (gaseous): Secondary | ICD-10-CM

## 2019-10-01 ENCOUNTER — Encounter (HOSPITAL_COMMUNITY)
Admission: RE | Admit: 2019-10-01 | Discharge: 2019-10-01 | Disposition: A | Discharge status: Home / Self Care | Payer: PRIVATE HEALTH INSURANCE | Source: Ambulatory Visit | Attending: Pulmonary Disease | Admitting: Pulmonary Disease

## 2019-10-01 ENCOUNTER — Other Ambulatory Visit: Payer: Self-pay | Admitting: Internal Medicine

## 2019-10-01 ENCOUNTER — Other Ambulatory Visit: Payer: Self-pay

## 2019-10-01 VITALS — BP 142/72 | HR 77 | Ht 72.5 in | Wt 240.0 lb

## 2019-10-01 DIAGNOSIS — K2900 Acute gastritis without bleeding: Secondary | ICD-10-CM

## 2019-10-01 DIAGNOSIS — D72828 Other elevated white blood cell count: Secondary | ICD-10-CM

## 2019-10-01 DIAGNOSIS — J449 Chronic obstructive pulmonary disease, unspecified: Secondary | ICD-10-CM

## 2019-10-01 NOTE — Progress Notes (Signed)
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 Valeta Harms      Initial Encounter Date:    Pulmonary Rehab Walk Test from 10/01/2019 in [place]  Date  10/01/19      Visit Diagnosis: Chronic obstructive pulmonary disease, unspecified COPD type ([place])  Patient's Home Medications on Admission:   Current Outpatient Medications:  .  Ascorbic Acid (VITAMIN C) 1000 MG tablet, Take 1,000 mg by mouth daily., Disp: , Rfl:  .  atorvastatin (LIPITOR) 10 MG tablet, Take 10 mg by mouth every evening. , Disp: , Rfl:  .  CIALIS 20 MG tablet, Take 20 mg by mouth daily as needed for erectile dysfunction. , Disp: , Rfl: 11 .  diltiazem (CARDIZEM CD) 360 MG 24 hr capsule, Take 360 mg by mouth every evening., Disp: , Rfl: 7 .  eplerenone (INSPRA) 50 MG tablet, Take 50 mg by mouth 2 (two) times daily., Disp: , Rfl:  .  Glucosamine HCl (GLUCOSAMINE PO), Take by mouth., Disp: , Rfl:  .  hydrochlorothiazide (MICROZIDE) 12.5 MG capsule, Take 12.5 mg by mouth daily., Disp: , Rfl:  .  loperamide (IMODIUM A-D) 2 MG tablet, Take 2 mg by mouth 2 (two) times daily., Disp: , Rfl:  .  losartan (COZAAR) 100 MG tablet, Take 100 mg by mouth daily., Disp: , Rfl:  .  MAGNESIUM PO, Take 1 capsule by mouth daily., Disp: , Rfl:  .  Multiple Vitamins-Minerals (ZINC PO), Take 1 tablet by mouth 2 (two) times daily., Disp: , Rfl:  .  omeprazole (PRILOSEC) 40 MG capsule, Take 40 mg by mouth daily before supper. , Disp: , Rfl:  .  oxybutynin (DITROPAN-XL) 10 MG 24 hr tablet, Take 10 mg by mouth daily., Disp: , Rfl:  .  Potassium Gluconate 2.5 MEQ TABS, Take 3 tablets by mouth 2 (two) times daily., Disp: , Rfl:  .  sertraline (ZOLOFT) 50 MG tablet, Take 1 tablet (50 mg total) by mouth every  evening. (Patient taking differently: Take 100 mg by mouth every evening. 100mg  daily), Disp: 90 tablet, Rfl: 1 .  vitamin B-12 (CYANOCOBALAMIN) 1000 MCG tablet, Take 1,000 mcg by mouth daily., Disp: , Rfl:  .  allopurinol (ZYLOPRIM) 100 MG tablet, Take 100 mg by mouth 2 (two) times daily. , Disp: , Rfl:  .  apixaban (ELIQUIS) 5 MG TABS tablet, Take 1 tablet (5 mg total) by mouth 2 (two) times daily., Disp: 180 tablet, Rfl: 3 .  Fluticasone-Umeclidin-Vilant (TRELEGY ELLIPTA) 100-62.5-25 MCG/INH AEPB, Inhale 1 puff into the lungs daily. (Patient not taking: Reported on 10/01/2019), Disp: 28 each, Rfl: 5  Past Medical History: Past Medical History:  Diagnosis Date  . 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HCC)    ongoing evaluation for elevated R>L adosterone secretion  . Arthritis   . Atrial enlargement, bilateral   . Atrial fibrillation (HCC)    paroxysmal  . Atrial flutter ([place])   . Complication of anesthesia    could not urinate after UHR, needed cath, mental fog sinc cervical fusion 08-09-16   . Dysrhythmia    a-fib-had ablation x2  . GERD (gastroesophageal reflux disease)   . Hyperaldosteronism ([place])   . Hypertension   . Kidney cysts    "multiple"  . Nephrolithiasis   . Pneumonia 1990's  .  Pre-diabetes   . Prostatitis   . Renal insufficiency    "Cr 1.6"    Tobacco Use: Social History   Tobacco Use  Smoking Status Never Smoker  Smokeless Tobacco Never Used    Labs: Recent Review Flowsheet Data    Labs for ITP Cardiac and Pulmonary Rehab Latest Ref Rng & Units 01/20/2010 01/23/2010 06/22/2010 08/01/2010   PHART 7.350 - 7.450 7.462(H) 7.420 - -   PCO2ART 35.0 - 45.0 mmHg 37.2 44.9 - -   HCO3 20.0 - 24.0 mEq/L 26.2(H) 29.0(H) - -   TCO2 0 - 100 mmol/L 27.3 30 28 29    O2SAT % 96.8 95.0 - -      Capillary Blood Glucose: Lab Results  Component Value Date   GLUCAP 144 (H) 03/14/2019   GLUCAP 84 10/25/2016   GLUCAP 95 06/23/2016   GLUCAP 87 03/03/2015   GLUCAP 141 (H)  01/24/2010     Pulmonary Assessment Scores: Pulmonary Assessment Scores    Row Name 10/01/19 1144 10/01/19 1151       ADL UCSD   ADL Phase  Entry  Entry    SOB Score total  --  9      CAT Score   CAT Score  --  5      mMRC Score   mMRC Score  0  --      UCSD: Self-administered rating of dyspnea associated with activities of daily living (ADLs) 6-point scale (0 = "not at all" to 5 = "maximal or unable to do because of breathlessness")  Scoring Scores range from 0 to 120.  Minimally important difference is 5 units  CAT: CAT can identify the health impairment of COPD patients and is better correlated with disease progression.  CAT has a scoring range of zero to 40. The CAT score is classified into four groups of low (less than 10), medium (10 - 20), high (21-30) and very high (31-40) based on the impact level of disease on health status. A CAT score over 10 suggests significant symptoms.  A worsening CAT score could be explained by an exacerbation, poor medication adherence, poor inhaler technique, or progression of COPD or comorbid conditions.  CAT MCID is 2 points  mMRC: mMRC (Modified Medical Research Council) Dyspnea Scale is used to assess the degree of baseline functional disability in patients of respiratory disease due to dyspnea. No minimal important difference is established. A decrease in score of 1 point or greater is considered a positive change.   Pulmonary Function Assessment: Pulmonary Function Assessment - 10/01/19 0938      Breath   Shortness of Breath  Yes;Limiting activity       Exercise Target Goals: Exercise Program Goal: Individual exercise prescription set using results from initial 6 min walk test and THRR while considering  patient's activity barriers and safety.   Exercise Prescription Goal: Initial exercise prescription builds to 30-45 minutes a day of aerobic activity, 2-3 days per week.  Home exercise guidelines will be given to patient during  program as part of exercise prescription that the participant will acknowledge.  Activity Barriers & Risk Stratification: Activity Barriers & Cardiac Risk Stratification - 10/01/19 0937      Activity Barriers & Cardiac Risk Stratification   Activity Barriers  Deconditioning;Muscular Weakness;Shortness of Breath       6 Minute Walk: 6 Minute Walk    Row Name 10/01/19 1144         6 Minute Walk   Phase  Initial  Distance  1675 feet     Walk Time  6 minutes     # of Rest Breaks  0     MPH  3.17     METS  3.66     RPE  13     Perceived Dyspnea   1     VO2 Peak  12.8     Symptoms  No     Resting HR  75 bpm     Resting BP  142/70     Resting Oxygen Saturation   96 %     Exercise Oxygen Saturation  during 6 min walk  93 %     Max Ex. HR  96 bpm     Max Ex. BP  144/64     2 Minute Post BP  126/72       Interval HR   1 Minute HR  86     2 Minute HR  90     3 Minute HR  93     4 Minute HR  95     5 Minute HR  96     6 Minute HR  91     2 Minute Post HR  72     Interval Heart Rate?  Yes       Interval Oxygen   Interval Oxygen?  Yes     Baseline Oxygen Saturation %  96 %     1 Minute Oxygen Saturation %  95 %     1 Minute Liters of Oxygen  0 L     2 Minute Oxygen Saturation %  93 %     2 Minute Liters of Oxygen  0 L     3 Minute Oxygen Saturation %  94 %     3 Minute Liters of Oxygen  0 L     4 Minute Oxygen Saturation %  93 %     4 Minute Liters of Oxygen  0 L     5 Minute Oxygen Saturation %  93 %     5 Minute Liters of Oxygen  0 L     6 Minute Oxygen Saturation %  93 %     6 Minute Liters of Oxygen  0 L     2 Minute Post Oxygen Saturation %  96 %     2 Minute Post Liters of Oxygen  0 L        Oxygen Initial Assessment: Oxygen Initial Assessment - 10/01/19 1144      Home Oxygen   Home Oxygen Device  None    Sleep Oxygen Prescription  None    Home Exercise Oxygen Prescription  None    Home at Rest Exercise Oxygen Prescription  None    Compliance with  Home Oxygen Use  Yes      Initial 6 min Walk   Oxygen Used  None      Program Oxygen Prescription   Program Oxygen Prescription  None      Intervention   Short Term Goals  To learn and exhibit compliance with exercise, home and travel O2 prescription;To learn and understand importance of monitoring SPO2 with pulse oximeter and demonstrate accurate use of the pulse oximeter.;To learn and understand importance of maintaining oxygen saturations>88%;To learn and demonstrate proper pursed lip breathing techniques or other breathing techniques.;To learn and demonstrate proper use of respiratory medications    Long  Term Goals  Demonstrates proper use of MDI's;Compliance with respiratory medication;Exhibits proper  breathing techniques, such as pursed lip breathing or other method taught during program session;Maintenance of O2 saturations>88%;Verbalizes importance of monitoring SPO2 with pulse oximeter and return demonstration;Exhibits compliance with exercise, home and travel O2 prescription       Oxygen Re-Evaluation:   Oxygen Discharge (Final Oxygen Re-Evaluation):   Initial Exercise Prescription: Initial Exercise Prescription - 10/01/19 1100      Date of Initial Exercise RX and Referring Provider   Date  10/01/19    Referring Provider  Dr. Valeta Harms      Track   Minutes  30      Prescription Details   Frequency (times per week)  3    Duration  Progress to 30 minutes of continuous aerobic without signs/symptoms of physical distress      Intensity   THRR 40-80% of Max Heartrate  62-124    Ratings of Perceived Exertion  11-13    Perceived Dyspnea  0-4      Progression   Progression  Continue progressive overload as per policy without signs/symptoms or physical distress.      Resistance Training   Training Prescription  Yes    Weight  blue bands    Reps  10-15       Perform Capillary Blood Glucose checks as needed.  Exercise Prescription Changes:   Exercise  Comments:   Exercise Goals and Review: Exercise Goals    Row Name 10/01/19 1149             Exercise Goals   Increase Physical Activity  Yes       Intervention  Provide advice, education, support and counseling about physical activity/exercise needs.;Develop an individualized exercise prescription for aerobic and resistive training based on initial evaluation findings, risk stratification, comorbidities and participant's personal goals.       Expected Outcomes  Short Term: Attend rehab on a regular basis to increase amount of physical activity.;Long Term: Add in home exercise to make exercise part of routine and to increase amount of physical activity.;Long Term: Exercising regularly at least 3-5 days a week.       Increase Strength and Stamina  Yes       Intervention  Provide advice, education, support and counseling about physical activity/exercise needs.;Develop an individualized exercise prescription for aerobic and resistive training based on initial evaluation findings, risk stratification, comorbidities and participant's personal goals.       Expected Outcomes  Short Term: Increase workloads from initial exercise prescription for resistance, speed, and METs.;Short Term: Perform resistance training exercises routinely during rehab and add in resistance training at home;Long Term: Improve cardiorespiratory fitness, muscular endurance and strength as measured by increased METs and functional capacity (6MWT)       Able to understand and use rate of perceived exertion (RPE) scale  Yes       Intervention  Provide education and explanation on how to use RPE scale       Expected Outcomes  Short Term: Able to use RPE daily in rehab to express subjective intensity level;Long Term:  Able to use RPE to guide intensity level when exercising independently       Able to understand and use Dyspnea scale  Yes       Intervention  Provide education and explanation on how to use Dyspnea scale        Expected Outcomes  Short Term: Able to use Dyspnea scale daily in rehab to express subjective sense of shortness of breath during exertion;Long Term: Able to use  Dyspnea scale to guide intensity level when exercising independently       Knowledge and understanding of Target Heart Rate Range (THRR)  Yes       Intervention  Provide education and explanation of THRR including how the numbers were predicted and where they are located for reference       Expected Outcomes  Short Term: Able to state/look up THRR;Short Term: Able to use daily as guideline for intensity in rehab;Long Term: Able to use THRR to govern intensity when exercising independently       Understanding of Exercise Prescription  Yes       Intervention  Provide education, explanation, and written materials on patient's individual exercise prescription       Expected Outcomes  Short Term: Able to explain program exercise prescription;Long Term: Able to explain home exercise prescription to exercise independently          Exercise Goals Re-Evaluation :   Discharge Exercise Prescription (Final Exercise Prescription Changes):   Nutrition:  Target Goals: Understanding of nutrition guidelines, daily intake of sodium 1500mg , cholesterol 200mg , calories 30% from fat and 7% or less from saturated fats, daily to have 5 or more servings of fruits and vegetables.  Biometrics:    Nutrition Therapy Plan and Nutrition Goals:   Nutrition Assessments:   Nutrition Goals Re-Evaluation:   Nutrition Goals Discharge (Final Nutrition Goals Re-Evaluation):   Psychosocial: Target Goals: Acknowledge presence or absence of significant depression and/or stress, maximize coping skills, provide positive support system. Participant is able to verbalize types and ability to use techniques and skills needed for reducing stress and depression.  Initial Review & Psychosocial Screening: Initial Psych Review & Screening - 10/01/19 1152       Initial Review   Current issues with  None Identified      Family Dynamics   Good Support System?  Yes      Barriers   Psychosocial barriers to participate in program  There are no identifiable barriers or psychosocial needs.      Screening Interventions   Interventions  Encouraged to exercise       Quality of Life Scores:  Scores of 19 and below usually indicate a poorer quality of life in these areas.  A difference of  2-3 points is a clinically meaningful difference.  A difference of 2-3 points in the total score of the Quality of Life Index has been associated with significant improvement in overall quality of life, self-image, physical symptoms, and general health in studies assessing change in quality of life.  PHQ-9: Recent Review Flowsheet Data    Depression screen [facility] 2/9 10/01/2019   Decreased Interest 0   Down, Depressed, Hopeless 0   PHQ - 2 Score 0   Altered sleeping 0   Tired, decreased energy 0   Change in appetite 0   Feeling bad or failure about yourself  0   Trouble concentrating 0   Moving slowly or fidgety/restless 0   Suicidal thoughts 0   PHQ-9 Score 0   Difficult doing work/chores Not difficult at all     Interpretation of Total Score  Total Score Depression Severity:  1-4 = Minimal depression, 5-9 = Mild depression, 10-14 = Moderate depression, 15-19 = Moderately severe depression, 20-27 = Severe depression   Psychosocial Evaluation and Intervention: Psychosocial Evaluation - 10/01/19 1153      Psychosocial Evaluation & Interventions   Interventions  Encouraged to exercise with the program and follow exercise prescription  Continue Psychosocial Services   No Follow up required       Psychosocial Re-Evaluation:   Psychosocial Discharge (Final Psychosocial Re-Evaluation):   Education: Education Goals: Education classes will be provided on a weekly basis, covering required topics. Participant will state understanding/return demonstration of  topics presented.  Learning Barriers/Preferences: Learning Barriers/Preferences - 10/01/19 1153      Learning Barriers/Preferences   Learning Barriers  None    Learning Preferences  Audio;Computer/Internet;Group Instruction;Individual Instruction;Pictoral;Skilled Demonstration;Verbal Instruction;Video;Written Material       Education Topics: Risk Factor Reduction:  -Group instruction that is supported by a PowerPoint presentation. Instructor discusses the definition of a risk factor, different risk factors for pulmonary disease, and how the heart and lungs work together.     Nutrition for Pulmonary Patient:  -Group instruction provided by PowerPoint slides, verbal discussion, and written materials to support subject matter. The instructor gives an explanation and review of healthy diet recommendations, which includes a discussion on weight management, recommendations for fruit and vegetable consumption, as well as protein, fluid, caffeine, fiber, sodium, sugar, and alcohol. Tips for eating when patients are short of breath are discussed.   Pursed Lip Breathing:  -Group instruction that is supported by demonstration and informational handouts. Instructor discusses the benefits of pursed lip and diaphragmatic breathing and detailed demonstration on how to preform both.     Oxygen Safety:  -Group instruction provided by PowerPoint, verbal discussion, and written material to support subject matter. There is an overview of "What is Oxygen" and "Why do we need it".  Instructor also reviews how to create a safe environment for oxygen use, the importance of using oxygen as prescribed, and the risks of noncompliance. There is a brief discussion on traveling with oxygen and resources the patient may utilize.   Oxygen Equipment:  -Group instruction provided by [facility] Staff utilizing handouts, written materials, and equipment demonstrations.   Signs and Symptoms:  -Group instruction  provided by written material and verbal discussion to support subject matter. Warning signs and symptoms of infection, stroke, and heart attack are reviewed and when to call the physician/911 reinforced. Tips for preventing the spread of infection discussed.   Advanced Directives:  -Group instruction provided by verbal instruction and written material to support subject matter. Instructor reviews Advanced Directive laws and proper instruction for filling out document.   Pulmonary Video:  -Group video education that reviews the importance of medication and oxygen compliance, exercise, good nutrition, pulmonary hygiene, and pursed lip and diaphragmatic breathing for the pulmonary patient.   Exercise for the Pulmonary Patient:  -Group instruction that is supported by a PowerPoint presentation. Instructor discusses benefits of exercise, core components of exercise, frequency, duration, and intensity of an exercise routine, importance of utilizing pulse oximetry during exercise, safety while exercising, and options of places to exercise outside of rehab.     Pulmonary Medications:  -Verbally interactive group education provided by instructor with focus on inhaled medications and proper administration.   Anatomy and Physiology of the Respiratory System and Intimacy:  -Group instruction provided by PowerPoint, verbal discussion, and written material to support subject matter. Instructor reviews respiratory cycle and anatomical components of the respiratory system and their functions. Instructor also reviews differences in obstructive and restrictive respiratory diseases with examples of each. Intimacy, Sex, and Sexuality differences are reviewed with a discussion on how relationships can change when diagnosed with pulmonary disease. Common sexual concerns are reviewed.   MD DAY -A group question and answer session with a medical  doctor that allows participants to ask questions that relate to their  pulmonary disease state.   OTHER EDUCATION -Group or individual verbal, written, or video instructions that support the educational goals of the pulmonary rehab program.   Holiday Eating Survival Tips:  -Group instruction provided by PowerPoint slides, verbal discussion, and written materials to support subject matter. The instructor gives patients tips, tricks, and techniques to help them not only survive but enjoy the holidays despite the onslaught of food that accompanies the holidays.   Knowledge Questionnaire Score: Knowledge Questionnaire Score - 10/01/19 1152      Knowledge Questionnaire Score   Pre Score  17/18       Core Components/Risk Factors/Patient Goals at Admission: Personal Goals and Risk Factors at Admission - 10/01/19 1154      Core Components/Risk Factors/Patient Goals on Admission   Improve shortness of breath with ADL's  Yes    Intervention  Provide education, individualized exercise plan and daily activity instruction to help decrease symptoms of SOB with activities of daily living.    Expected Outcomes  Short Term: Improve cardiorespiratory fitness to achieve a reduction of symptoms when performing ADLs;Long Term: Be able to perform more ADLs without symptoms or delay the onset of symptoms       Core Components/Risk Factors/Patient Goals Review:  Goals and Risk Factor Review    Row Name 10/01/19 0941             Core Components/Risk Factors/Patient Goals Review   Personal Goals Review  Develop more efficient breathing techniques such as purse lipped breathing and diaphragmatic breathing and practicing self-pacing with activity.;Increase knowledge of respiratory medications and ability to use respiratory devices properly.;Improve shortness of breath with ADL's          Core Components/Risk Factors/Patient Goals at Discharge (Final Review):  Goals and Risk Factor Review - 10/01/19 0941      Core Components/Risk Factors/Patient Goals Review   Personal  Goals Review  Develop more efficient breathing techniques such as purse lipped breathing and diaphragmatic breathing and practicing self-pacing with activity.;Increase knowledge of respiratory medications and ability to use respiratory devices properly.;Improve shortness of breath with ADL's       ITP Comments:   Comments:

## 2019-10-01 NOTE — Progress Notes (Signed)
         Confirm Consent - In the setting of the current Covid19 crisis, you are scheduled for a phone visit with your Cardiac or Pulmonary team member.  Just as we do with many in-gym visits, in order for you to participate in this visit, we must obtain consent.  If you'd like, I can send this to your mychart (if signed up) or email for you to review.  Otherwise, I can obtain your verbal consent now.  By agreeing to a telephone visit, we'd like you to understand that the technology does not allow for your Cardiac or Pulmonary Rehab team member to perform a physical assessment, and thus may limit their ability to fully assess your ability to perform exercise programs. If your provider identifies any concerns that need to be evaluated in person, we will make arrangements to do so.  Finally, though the technology is pretty good, we cannot assure that it will always work on either your or our end and we cannot ensure that we have a secure connection.  Cardiac and Pulmonary Rehab Telehealth visits and "At Home" cardiac and pulmonary rehab are provided at no cost to you.        Are you willing to proceed?"        STAFF: Did the patient verbally acknowledge consent to telehealth visit? Document YES/NO here: Yes     Rosebud Poles RN  Cardiac and Pulmonary Rehab Staff        Date 10/01/19     @ Time 1000

## 2019-10-01 NOTE — Progress Notes (Signed)
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 He was able to walk from the physican's parking lot with minimal shortness of breath. He does not carry portable oxygen. Per pt, he uses oxygen never. Color good, skin warm and dry. Patient is oriented to time and place. Patient's medical history, psychosocial health, and medications reviewed. Psychosocial assessment reveals pt lives with their spouse. Pt is currently full time job as an ENT physician, and is anticipating retirement in April 2021.  Pt reports his stress level is low. Pt does not exhibit signs of depression. PHQ2/9 score 0/0. Pt shows good  coping skills with positive outlook .  Will continue to monitor and evaluate progress toward psychosocial goal(s) of continued well-being without barriers or psychosocial concerns while in pulmonary rehab. For now patient will do virtual pulmonary rehab due to a busy work schedule and traveling post retirement.  I made patient aware that when his schedule slows down that he could exercise in-person in our department if he prefers.   Physical assessment reveals heart rate is normal. Patient reports hedoes take medications as prescribed. Patient states he follows a Regular diet. The patient reports no specific efforts to gain or lose weight.. Patient's weight will be monitored closely. Demonstration and practice of PLB using pulse oximeter. Patient able to return demonstration satisfactorily. Safety and hand hygiene in the exercise area reviewed with patient. Patient voices understanding of the information reviewed. Department expectations discussed with patient and achievable goals were set. The patient shows enthusiasm about attending the program and we look forward to working with this nice gentleman. The patient completed a 6 min walk test today and will begin using the [employer] App and logging his  exercise immediately. 0900-1100

## 2019-10-02 ENCOUNTER — Ambulatory Visit
Admission: RE | Admit: 2019-10-02 | Discharge: 2019-10-02 | Disposition: A | Discharge status: Home / Self Care | Payer: PRIVATE HEALTH INSURANCE | Source: Ambulatory Visit | Attending: Internal Medicine | Admitting: Internal Medicine

## 2019-10-02 DIAGNOSIS — D72828 Other elevated white blood cell count: Secondary | ICD-10-CM

## 2019-10-02 DIAGNOSIS — K2900 Acute gastritis without bleeding: Secondary | ICD-10-CM

## 2019-10-09 ENCOUNTER — Telehealth: Payer: Self-pay | Admitting: Pulmonary Disease

## 2019-10-09 NOTE — Telephone Encounter (Signed)
Error.  Sent SM to Dr. Valeta Harms instead.

## 2019-10-30 ENCOUNTER — Ambulatory Visit (INDEPENDENT_AMBULATORY_CARE_PROVIDER_SITE_OTHER)
Admission: RE | Admit: 2019-10-30 | Discharge: 2019-10-30 | Disposition: A | Discharge status: Home / Self Care | Payer: PRIVATE HEALTH INSURANCE | Source: Ambulatory Visit | Attending: Pulmonary Disease | Admitting: Pulmonary Disease

## 2019-10-30 ENCOUNTER — Other Ambulatory Visit: Payer: Self-pay

## 2019-10-30 DIAGNOSIS — R918 Other nonspecific abnormal finding of lung field: Secondary | ICD-10-CM | POA: Diagnosis not present

## 2019-10-30 DIAGNOSIS — R911 Solitary pulmonary nodule: Secondary | ICD-10-CM

## 2019-10-30 DIAGNOSIS — IMO0001 Reserved for inherently not codable concepts without codable children: Secondary | ICD-10-CM

## 2019-11-21 ENCOUNTER — Telehealth: Payer: Self-pay | Admitting: *Deleted

## 2019-11-21 NOTE — Telephone Encounter (Signed)
Pt called: Traveling to [place] next week. Takes eliquis and knows to wear compression socks and get up to walk on flight. Wants to know if he should do anything else to prevent a blood clot. Dr.Kale informed. Per Dr. Irene Limbo - contacted patient: He should stay well hydrated, avoid much etoh or caffeine - to minimize risk of dehydration, standingup and walking a few minutes every hour if possible, avoiding crossing legs while sitting. If he feels particularly inclined he could get a portable sequential calf compression device (available on amazon for reasonable price). Patient verbalized understanding and states he will look into the sequential compression device.

## 2019-12-05 ENCOUNTER — Telehealth: Payer: Self-pay | Admitting: Pulmonary Disease

## 2019-12-05 NOTE — Telephone Encounter (Signed)
Advised pt of results. Pt understood and nothing further is needed.   

## 2019-12-21 ENCOUNTER — Other Ambulatory Visit: Payer: Self-pay | Admitting: Hematology

## 2019-12-21 MED ORDER — ENOXAPARIN SODIUM 40 MG/0.4ML ~~LOC~~ SOLN: 40.0000 mg | SUBCUTANEOUS | 1 refills | Status: DC

## 2019-12-21 NOTE — Progress Notes (Signed)
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 He is on long term anticoagulation for significant VTE. PLAN -would hold Eliquis 72h prior to planned procedure and start prophylactic dose of lovenox @ 40mg  [place] daily and continue till 24h prior to each of the planned procedures.(last dose of lovenox 24h prior to planned procedure). -post-procedure when hemostasis is assured- restart lovenox 40mg  [place] for 24-48h and then transition back to therapeutic ELiquis if okay per surgeon. -plan discussed with Dr [name]. -will forward recommendation to Dr Alona Bene  .[place]

## 2020-01-17 ENCOUNTER — Telehealth: Payer: Self-pay

## 2020-01-17 NOTE — Telephone Encounter (Signed)
Spoke with pt regarding appt on 01/21/20. Pt stated he did not have any questions and confirmed virtual visit.

## 2020-01-21 ENCOUNTER — Telehealth (INDEPENDENT_AMBULATORY_CARE_PROVIDER_SITE_OTHER): Payer: Medicare Other | Admitting: Internal Medicine

## 2020-01-21 ENCOUNTER — Encounter: Payer: Self-pay | Admitting: Internal Medicine

## 2020-01-21 VITALS — BP 152/97 | HR 71 | Ht 72.5 in | Wt 235.0 lb

## 2020-01-21 DIAGNOSIS — I480 Paroxysmal atrial fibrillation: Secondary | ICD-10-CM

## 2020-01-21 DIAGNOSIS — I10 Essential (primary) hypertension: Secondary | ICD-10-CM | POA: Diagnosis not present

## 2020-01-21 DIAGNOSIS — D6869 Other thrombophilia: Secondary | ICD-10-CM | POA: Diagnosis not present

## 2020-01-21 NOTE — Progress Notes (Signed)
Electrophysiology TeleHealth Note  Due to national recommendations of social distancing due to [place] 19, an audio telehealth visit is felt to be most appropriate for this patient at this time.  Verbal consent was obtained by me for the telehealth visit today.  The patient does not have capability for a virtual visit.  A phone visit is therefore required today.   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  Since last being seen in our clinic, the patient reports doing very well. He is now retired and doing very well.  Today, he denies symptoms of palpitations, chest pain, shortness of breath,  lower extremity edema, dizziness, presyncope, or syncope.  The patient is otherwise without complaint today.     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[place])    ongoing evaluation for elevated R>L adosterone secretion   Arthritis    Atrial enlargement, bilateral    Atrial fibrillation (H[facility])    paroxysmal   Atrial flutter ([facility])    Complication of anesthesia    could not urinate after UHR, needed cath, mental fog sinc cervical fusion 08-09-16    Dysrhythmia    a-fib-had ablation x2   GERD (gastroesophageal reflux disease)    Hyperaldosteronism ([place])    Hypertension    Kidney cysts    "multiple"   Nephrolithiasis    Pneumonia 1990's   Pre-diabetes    Prostatitis    Renal insufficiency    "Cr 1.6"    Past Surgical History:  Procedure Laterality Date   ABLATION     ATRIAL FIBRILLATION ABLATION N/A 09/28/2012   Procedure: ATRIAL FIBRILLATION ABLATION;  Surgeon: Thompson Grayer, MD;   Location: [facility] CATH LAB;  Service: Cardiovascular;  Laterality: N/A;   atrial fibrillation ablation with CTI ablation  05/27/11, 09/28/12   ablation for afib and atrial flutter by Dr Norman Herrlich SURGERY  08/09/2016   C 5 6  C 6 to C 7 cervical fusion   CARDIOVERSION  07/28/2011   Procedure: CARDIOVERSION;  Surgeon: Lelon Perla, MD;  Location: [facility];  Service: Cardiovascular;  Laterality: N/A;   COLONOSCOPY WITH PROPOFOL N/A 03/03/2015   Procedure: COLONOSCOPY WITH PROPOFOL;  Surgeon: Garlan Fair, MD;  Location: [facility];  Service: Gastroenterology;  Laterality: N/A;   double j stent placement  06/2004   left   ESOPHAGOGASTRODUODENOSCOPY (EGD) WITH PROPOFOL N/A 03/03/2015   Procedure: ESOPHAGOGASTRODUODENOSCOPY (EGD) WITH PROPOFOL;  Surgeon: Garlan Fair, MD;  Location: [facility];  Service: Gastroenterology;  Laterality: N/A;   ESOPHAGOGASTRODUODENOSCOPY (EGD) WITH PROPOFOL N/A 10/25/2016   Procedure: ESOPHAGOGASTRODUODENOSCOPY (EGD) WITH PROPOFOL;  Surgeon: Garlan Fair, MD;  Location: [facility];  Service: Endoscopy;  Laterality: N/A;   INGUINAL HERNIA REPAIR     "as a child; ? right"   INGUINAL HERNIA REPAIR Left 06/23/2016   Procedure: OPEN LEFT INGUINAL HERNIA  REPAIR WITH MESH;  Surgeon: Johnathan Hausen, MD;  Location: [facility];  Service: General;  Laterality: Left;   INSERTION OF MESH Left 06/23/2016   Procedure: INSERTION OF MESH;  Surgeon: Johnathan Hausen, MD;  Location: [facility];  Service: General;  Laterality: Left;   LEFT HEART CATH AND CORONARY ANGIOGRAPHY N/A 07/25/2018   Procedure: LEFT HEART CATH AND CORONARY ANGIOGRAPHY;  Surgeon: Belva Crome, MD;  Location: [facility] CV LAB;  Service: Cardiovascular;  Laterality: N/A;   LUNG BIOPSY  1993   NASAL SEPTOPLASTY W/ TURBINOPLASTY  07/2006   prostate biopsy  2011   schwannoma removal  ~01/2010   left thorax   TEE WITHOUT CARDIOVERSION N/A 09/27/2012   Procedure:  TRANSESOPHAGEAL ECHOCARDIOGRAM (TEE);  Surgeon: Larey Dresser, MD;  Location: [facility];  Service: Cardiovascular;  Laterality: N/A;   ULTRASOUND GUIDANCE FOR VASCULAR ACCESS  07/25/2018   Procedure: Ultrasound Guidance For Vascular Access;  Surgeon: Belva Crome, MD;  Location: [place] CV LAB;  Service: Cardiovascular;;   UMBILICAL HERNIA REPAIR  07/2006    Current Outpatient Medications  Medication Sig Dispense Refill   allopurinol (ZYLOPRIM) 100 MG tablet Take 100 mg by mouth 2 (two) times daily.      apixaban (ELIQUIS) 5 MG TABS tablet Take 1 tablet (5 mg total) by mouth 2 (two) times daily. 180 tablet 3   Ascorbic Acid (VITAMIN C) 1000 MG tablet Take 1,000 mg by mouth daily.     atorvastatin (LIPITOR) 10 MG tablet Take 10 mg by mouth every evening.      cephALEXin (KEFLEX) 500 MG capsule Take 500 mg by mouth 4 (four) times daily.     CIALIS 20 MG tablet Take 20 mg by mouth daily as needed for erectile dysfunction.   11   diltiazem (CARDIZEM CD) 360 MG 24 hr capsule Take 360 mg by mouth every evening.  7   eplerenone (INSPRA) 50 MG tablet Take 50 mg by mouth 2 (two) times daily.     Fluticasone-Umeclidin-Vilant (TRELEGY ELLIPTA) 100-62.5-25 MCG/INH AEPB Inhale 1 puff into the lungs daily. 28 each 5   Glucosamine HCl (GLUCOSAMINE PO) Take by mouth.     hydrochlorothiazide (MICROZIDE) 12.5 MG capsule Take 12.5 mg by mouth daily.     losartan (COZAAR) 100 MG tablet Take 100 mg by mouth daily.     MAGNESIUM PO Take 1 capsule by mouth daily.     Multiple Vitamins-Minerals (ZINC PO) Take 1 tablet by mouth 2 (two) times daily.     omeprazole (PRILOSEC) 40 MG capsule Take 40 mg by mouth daily before supper.      Potassium Gluconate 2.5 MEQ TABS Take 3 tablets by mouth 2 (two) times daily.     sertraline (ZOLOFT) 50 MG tablet Take 1 tablet (50 mg total) by mouth every evening. (Patient taking differently: Take 100 mg by mouth every evening. 100mg  daily) 90 tablet 1    vitamin B-12 (CYANOCOBALAMIN) 1000 MCG tablet Take 1,000 mcg by mouth daily.     enoxaparin (LOVENOX) 40 MG/0.4ML injection Inject 0.4 mLs (40 mg total) into the skin daily for 10 doses. Hold ELiquis 72h prior to planned procedure and start prophylactic lovenox 40mg  [place] daily with last lovenox dose 24h prior to surgical procedure. 2 mL 1   No current facility-administered medications for this visit.    Allergies:   Aldactone [spironolactone], Codeine, Lisinopril, Nexium [esomeprazole magnesium], Sulfa antibiotics, and Sulfa drugs cross reactors   Social History:  The patient  reports that he has never smoked. He has never used smokeless tobacco. He reports current alcohol use of about 2.0 standard drinks of alcohol per week. He reports that he does not use drugs.  ROS:  Please see the history of present illness.   All other systems are personally reviewed and negative.    Exam:    Vital Signs:  BP (!) 152/97    Pulse 71    Ht 6' 0.5" (1.842 m)    Wt 235 lb (106.6 kg)    BMI 31.43 kg/m   Well sounding, alert and conversant   Labs/Other Tests and Data Reviewed:    Recent Labs: 03/15/2019: Magnesium 1.7 04/30/2019: ALT 21; Hemoglobin 14.4; Platelets 250 06/04/2019: TSH 2.150 08/23/2019: BUN 29; Creatinine, Ser 1.50; Potassium 3.6; Sodium 137   Wt Readings from Last 3 Encounters:  01/21/20 235 lb (106.6 kg)  10/01/19 240 lb (108.9 kg)  08/31/19 241 lb 6.4 oz (109.5 kg)      ASSESSMENT & PLAN:    1.  Paroxysmal atrial fibrillation Doing well without clinical recurrence of AF Continue [place] for CHADS2VASC of 2 He has had prior PTE as well, would recommend long term anticoagulation  2.  PE Followed by Dr Corrin Parker at [facility] with recent CPX done with pulmonary limitations   3.  HTN Stable No change required today  4.  Pulmonary nodules on CT Followed by pulmonary    Risks, benefits and potential toxicities for medications prescribed and/or refilled reviewed with patient today.    Follow-up:  With me in 6 months    Patient Risk:  after full review of this patients clinical status, I feel that they are at moderate risk at this time.  Today, I have spent 15 minutes with the patient with telehealth technology discussing arrhythmia management .    Army Fossa, MD  01/21/2020 9:14 AM     [facility] [address] [place] [address] [phone] (office) [phone] (fax)

## 2020-01-28 IMAGING — MR MR PROSTATE WO/W CM
56 series · 56 of 56 positions shown · IV contrast (Multihance 20cc)
Comparison: CT abdomen/pelvis dated 10/18/2016

CLINICAL DATA: Elevated PSA, benign biopsy in 9711

EXAM:
MR PROSTATE WITHOUT AND WITH CONTRAST
TECHNIQUE: Multiplanar multisequence MRI images were obtained of the pelvis
centered about the prostate. Pre and post contrast images were
obtained.
CONTRAST:  20mL MULTIHANCE GADOBENATE DIMEGLUMINE 529 MG/ML IV SOLN
Creatinine was obtained on site at [HOSPITAL] at [HOSPITAL].
Results: Creatinine 1.5 mg/dL.

[Series 3: T1 · axial · 8.0mm · 1.09mm/px · 1 of 32 slices shown (1 of 3)]
[im 1/32]
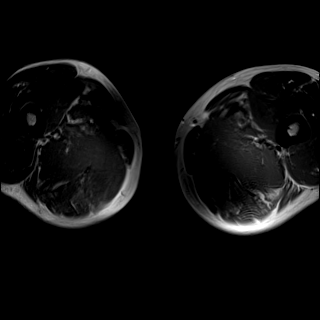

[Series 4: bSSFP fat-sat · axial · 8.0mm · 0.74mm/px · 1 of 32 slices shown]
[im 1/32]
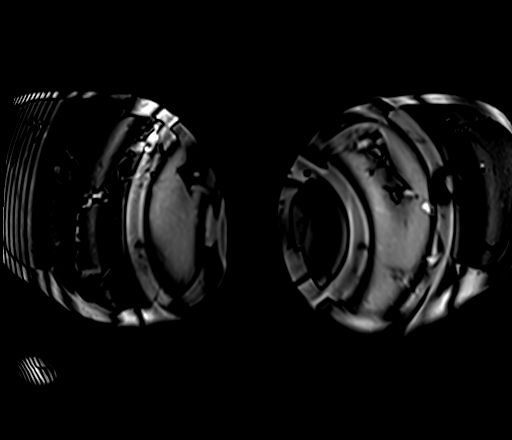

[Series 5: T2 · sagittal · 3.5mm · 0.59mm/px · 1 of 47 slices shown (1 of 4)]
[im 1/47]
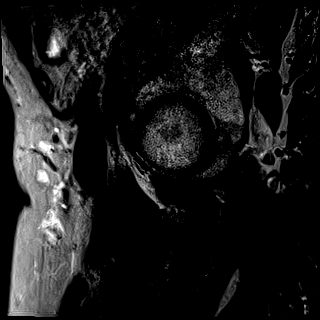

[Series 6: T1 · axial · 3.0mm · 0.33mm/px · 1 of 40 slices shown (2 of 3)]
[im 1/40]
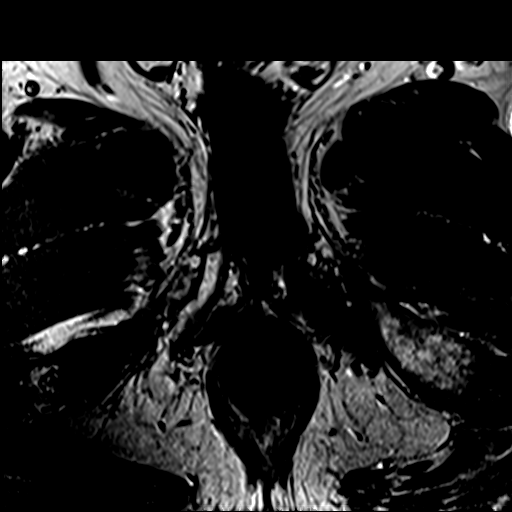

[Series 7: T2 · axial · 3.5mm · 0.59mm/px · 1 of 35 slices shown (2 of 4)]
[im 1/35]
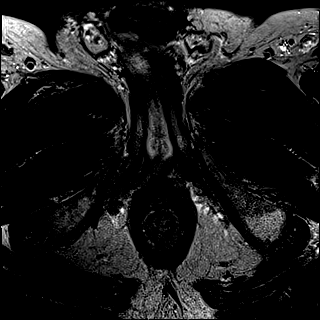

[Series 8: T1 · axial · 3.0mm · 0.33mm/px · 1 of 50 slices shown (3 of 3)]
[im 1/50]
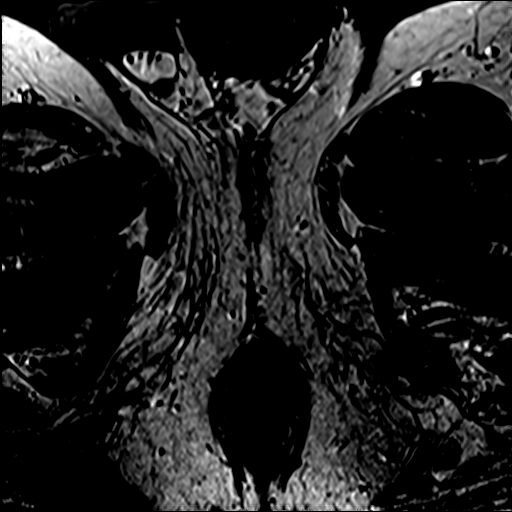

[Series 9: T2 · axial · 1.0mm · 1.04mm/px · 1 of 104 slices shown (3 of 4)]
[im 1/104]
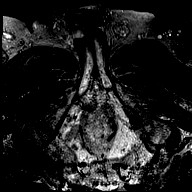

[Series 10: T2 · coronal · 3.5mm · 0.59mm/px · 1 of 54 slices shown (4 of 4)]
[im 1/54]
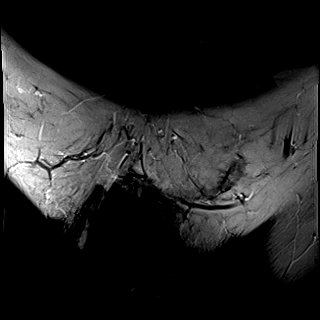

[Series 11: DWI · axial · 3.5mm · 1.56mm/px · 1 of 96 slices shown (1 of 2)]
[im 1/96]
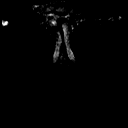

[Series 12: DWI · axial · 3.5mm · 1.56mm/px · 1 of 32 slices shown (2 of 2)]
[im 1/32]
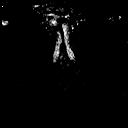

[Series 13: pre t1_twist_tra_dyn_ttc=7.5s · axial · non-contrast · 3.5mm · 0.89mm/px · 1 of 30 slices shown]
[im 1/30]
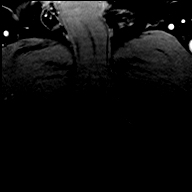

[Series 14: post t1_twist_tra_dyn-copy center · axial · 3.5mm · 0.89mm/px · 1 of 30 slices shown (1 of 23)]
[im 1/30]
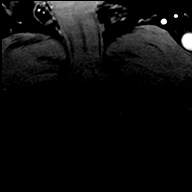

[Series 15: post t1_twist_tra_dyn-copy center · axial · 3.5mm · 0.89mm/px · 1 of 30 slices shown (2 of 23)]
[im 1/30]
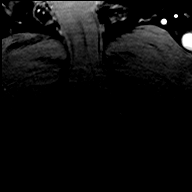

[Series 16: post t1_twist_tra_dyn-copy cent_sub_ttc=(id) · axial · 3.5mm · 0.89mm/px · 1 of 30 slices shown (1 of 22)]
[im 1/30]
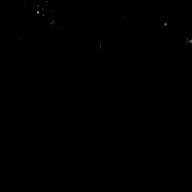

[Series 17: post t1_twist_tra_dyn-copy center · axial · 3.5mm · 0.89mm/px · 1 of 30 slices shown (3 of 23)]
[im 1/30]
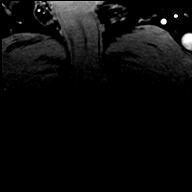

[Series 18: post t1_twist_tra_dyn-copy cent_sub_ttc=(id) · axial · 3.5mm · 0.89mm/px · 1 of 30 slices shown (2 of 22)]
[im 1/30]
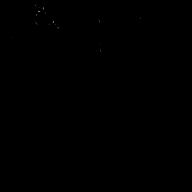

[Series 19: post t1_twist_tra_dyn-copy center · axial · 3.5mm · 0.89mm/px · 1 of 30 slices shown (4 of 23)]
[im 1/30]
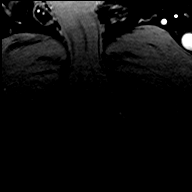

[Series 20: post t1_twist_tra_dyn-copy cent_sub_ttc=(id) · axial · 3.5mm · 0.89mm/px · 1 of 30 slices shown (3 of 22)]
[im 1/30]
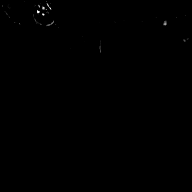

[Series 21: post t1_twist_tra_dyn-copy center · axial · 3.5mm · 0.89mm/px · 1 of 30 slices shown (5 of 23)]
[im 1/30]
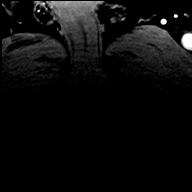

[Series 22: post t1_twist_tra_dyn-copy cent_sub_ttc=(id) · axial · 3.5mm · 0.89mm/px · 1 of 30 slices shown (4 of 22)]
[im 1/30]
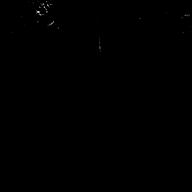

[Series 23: post t1_twist_tra_dyn-copy center · axial · 3.5mm · 0.89mm/px · 1 of 30 slices shown (6 of 23)]
[im 1/30]
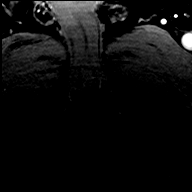

[Series 24: post t1_twist_tra_dyn-copy cent_sub_ttc=(id) · axial · 3.5mm · 0.89mm/px · 1 of 30 slices shown (5 of 22)]
[im 1/30]
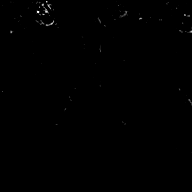

[Series 25: post t1_twist_tra_dyn-copy center · axial · 3.5mm · 0.89mm/px · 1 of 30 slices shown (7 of 23)]
[im 1/30]
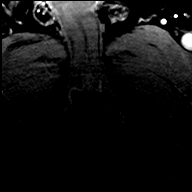

[Series 26: post t1_twist_tra_dyn-copy cent_sub_ttc=(id) · axial · 3.5mm · 0.89mm/px · 1 of 30 slices shown (6 of 22)]
[im 1/30]
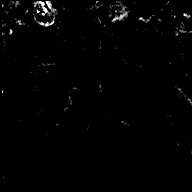

[Series 27: post t1_twist_tra_dyn-copy center · axial · 3.5mm · 0.89mm/px · 1 of 30 slices shown (8 of 23)]
[im 1/30]
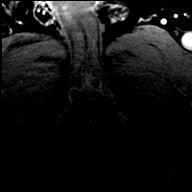

[Series 28: post t1_twist_tra_dyn-copy cent_sub_ttc=(id) · axial · 3.5mm · 0.89mm/px · 1 of 30 slices shown (7 of 22)]
[im 1/30]
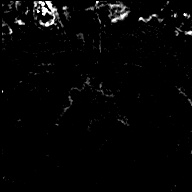

[Series 29: post t1_twist_tra_dyn-copy center · axial · 3.5mm · 0.89mm/px · 1 of 30 slices shown (9 of 23)]
[im 1/30]
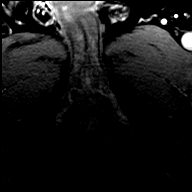

[Series 30: post t1_twist_tra_dyn-copy cent_sub_ttc=(id) · axial · 3.5mm · 0.89mm/px · 1 of 30 slices shown (8 of 22)]
[im 1/30]
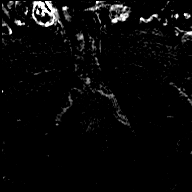

[Series 31: post t1_twist_tra_dyn-copy center · axial · 3.5mm · 0.89mm/px · 1 of 30 slices shown (10 of 23)]
[im 1/30]
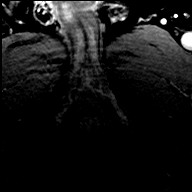

[Series 32: post t1_twist_tra_dyn-copy cent_sub_ttc=(id) · axial · 3.5mm · 0.89mm/px · 1 of 30 slices shown (9 of 22)]
[im 1/30]
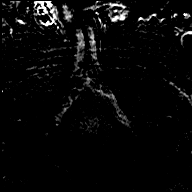

[Series 33: post t1_twist_tra_dyn-copy center · axial · 3.5mm · 0.89mm/px · 1 of 30 slices shown (11 of 23)]
[im 1/30]
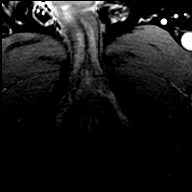

[Series 34: post t1_twist_tra_dyn-copy cent_sub_ttc=(id) · axial · 3.5mm · 0.89mm/px · 1 of 30 slices shown (10 of 22)]
[im 1/30]
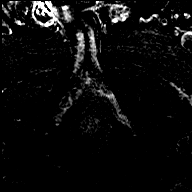

[Series 35: post t1_twist_tra_dyn-copy center · axial · 3.5mm · 0.89mm/px · 1 of 30 slices shown (12 of 23)]
[im 1/30]
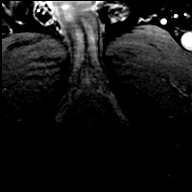

[Series 36: post t1_twist_tra_dyn-copy cent_sub_ttc=(id) · axial · 3.5mm · 0.89mm/px · 1 of 30 slices shown (11 of 22)]
[im 1/30]
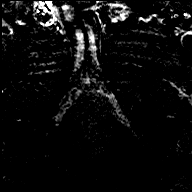

[Series 37: post t1_twist_tra_dyn-copy center · axial · 3.5mm · 0.89mm/px · 1 of 30 slices shown (13 of 23)]
[im 1/30]
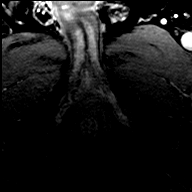

[Series 38: post t1_twist_tra_dyn-copy cent_sub_ttc=(id) · axial · 3.5mm · 0.89mm/px · 1 of 30 slices shown (12 of 22)]
[im 1/30]
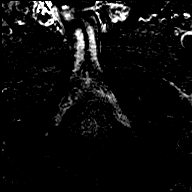

[Series 39: post t1_twist_tra_dyn-copy center · axial · 3.5mm · 0.89mm/px · 1 of 30 slices shown (14 of 23)]
[im 1/30]
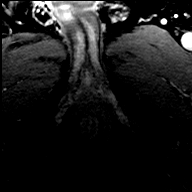

[Series 40: post t1_twist_tra_dyn-copy cent_sub_ttc=(id) · axial · 3.5mm · 0.89mm/px · 1 of 30 slices shown (13 of 22)]
[im 1/30]
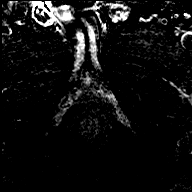

[Series 41: post t1_twist_tra_dyn-copy center · axial · 3.5mm · 0.89mm/px · 1 of 30 slices shown (15 of 23)]
[im 1/30]
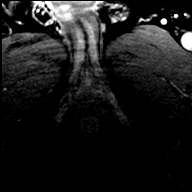

[Series 42: post t1_twist_tra_dyn-copy cent_sub_ttc=(id) · axial · 3.5mm · 0.89mm/px · 1 of 30 slices shown (14 of 22)]
[im 1/30]
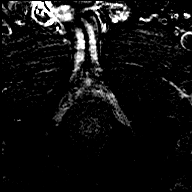

[Series 43: post t1_twist_tra_dyn-copy center · axial · 3.5mm · 0.89mm/px · 1 of 30 slices shown (16 of 23)]
[im 1/30]
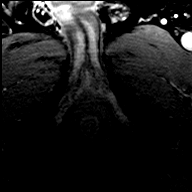

[Series 44: post t1_twist_tra_dyn-copy cent_sub_ttc=(id) · axial · 3.5mm · 0.89mm/px · 1 of 30 slices shown (15 of 22)]
[im 1/30]
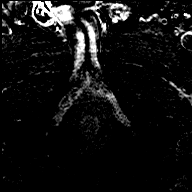

[Series 45: post t1_twist_tra_dyn-copy center · axial · 3.5mm · 0.89mm/px · 1 of 30 slices shown (17 of 23)]
[im 1/30]
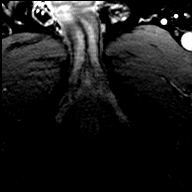

[Series 46: post t1_twist_tra_dyn-copy cent_sub_ttc=(id) · axial · 3.5mm · 0.89mm/px · 1 of 30 slices shown (16 of 22)]
[im 1/30]
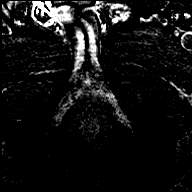

[Series 47: post t1_twist_tra_dyn-copy center · axial · 3.5mm · 0.89mm/px · 1 of 30 slices shown (18 of 23)]
[im 1/30]
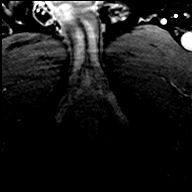

[Series 48: post t1_twist_tra_dyn-copy cent_sub_ttc=(id) · axial · 3.5mm · 0.89mm/px · 1 of 30 slices shown (17 of 22)]
[im 1/30]
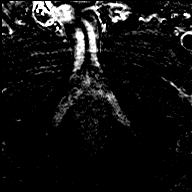

[Series 49: post t1_twist_tra_dyn-copy center · axial · 3.5mm · 0.89mm/px · 1 of 30 slices shown (19 of 23)]
[im 1/30]
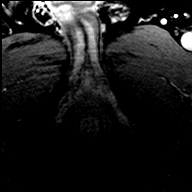

[Series 50: post t1_twist_tra_dyn-copy cent_sub_ttc=(id) · axial · 3.5mm · 0.89mm/px · 1 of 30 slices shown (18 of 22)]
[im 1/30]
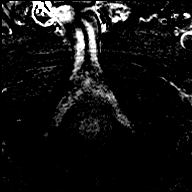

[Series 51: post t1_twist_tra_dyn-copy center · axial · 3.5mm · 0.89mm/px · 1 of 30 slices shown (20 of 23)]
[im 1/30]
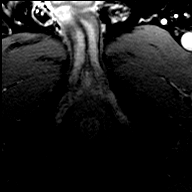

[Series 52: post t1_twist_tra_dyn-copy cent_sub_ttc=(id) · axial · 3.5mm · 0.89mm/px · 1 of 30 slices shown (19 of 22)]
[im 1/30]
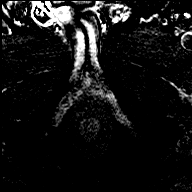

[Series 53: post t1_twist_tra_dyn-copy center · axial · 3.5mm · 0.89mm/px · 1 of 30 slices shown (21 of 23)]
[im 1/30]
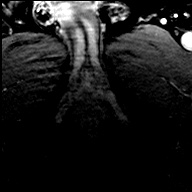

[Series 54: post t1_twist_tra_dyn-copy cent_sub_ttc=(id) · axial · 3.5mm · 0.89mm/px · 1 of 30 slices shown (20 of 22)]
[im 1/30]
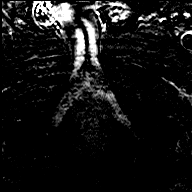

[Series 55: post t1_twist_tra_dyn-copy center · axial · 3.5mm · 0.89mm/px · 1 of 30 slices shown (22 of 23)]
[im 1/30]
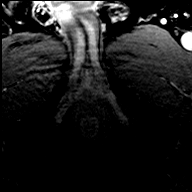

[Series 56: post t1_twist_tra_dyn-copy cent_sub_ttc=(id) · axial · 3.5mm · 0.89mm/px · 1 of 30 slices shown (21 of 22)]
[im 1/30]
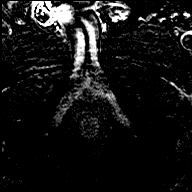

[Series 57: post t1_twist_tra_dyn-copy center · axial · 3.5mm · 0.89mm/px · 1 of 30 slices shown (23 of 23)]
[im 1/30]
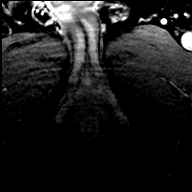

[Series 58: post t1_twist_tra_dyn-copy cent_sub_ttc=(id) · axial · 3.5mm · 0.89mm/px · 1 of 30 slices shown (22 of 22)]
[im 1/30]
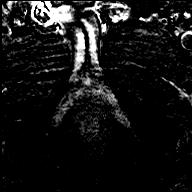

[56 of 56 positions shown; findings below may reference images not displayed]

FINDINGS: Prostate: 18 x 9 x 17 mm low T2 lesion in the right posterolateral
mid gland to apex of the peripheral zone (series 7/image 25). No
restricted diffusion/low ADC. Equivocal arterial enhancement (series
26/image 14). By definition, this is considered a PI-RADS 3 lesion.

Vague low T2 signal at the extreme left apex (series 8/image 76),
without convincing lesion on sagittal/coronal sequences, and without
associated early arterial enhancement or restricted diffusion. As
such, this may simply reflect post infectious/inflammatory scarring.

Enlargement/nodularity central gland, suggesting BPH. No suspicious
central gland nodule on T2.

Volume: 6.5 x 5.6 x 6.6 cm (calculated volume 113.1 mL)

Transcapsular spread:  Absent.

Seminal vesicle involvement: Absent.

Neurovascular bundle involvement: Absent.

Pelvic adenopathy: Absent.

Bone metastasis: Absent.

Other findings: Bladder is thick-walled although underdistended.
small fat containing bilateral inguinal hernias.
IMPRESSION: 1.8 cm low T2 lesion in the right posterolateral mid gland to apex
of the peripheral zone, concerning for possible macroscopic prostate
cancer. PI-RADS 3.

No evidence of extracapsular extension, seminal vesicle invasion, or
metastatic disease.

Enlargement/nodularity of the central gland, suggesting BPH. No
suspicious central gland nodule on T2.

Calculated prostate volume 113 mL.

## 2020-03-05 ENCOUNTER — Telehealth: Payer: Self-pay | Admitting: Pulmonary Disease

## 2020-03-05 NOTE — Telephone Encounter (Signed)
Spoke with the pt  He states that that he and his spouse just returned from a trip to [place]  They decided to have covid antibody testing done  Pt states his test value was 148 and his spouses was 1500  He is asking what these values mean  He also wants to know if he needs to have a covid vaccine booster  Please advise, thanks

## 2020-03-10 NOTE — Telephone Encounter (Signed)
PCCM:  Truthfully we are not sure what levels of titers are necessary but medically we would interpret higher titers to be more protective usually. But, once qualifications come out for covid boosters in folks over age 66 yes, I would recommend getting a booster.   Garner Nash, DO [facility] 03/10/2020 2:46 PM

## 2020-03-10 NOTE — Telephone Encounter (Signed)
Spoke with patient and reviewed the recommendations from the [facility] regarding immunocompromised patients. Patient has appointment on 8/17 to get the booster even though his chart does not indicate a immunocompromised condition. Patient is still going to attempt to get the vaccine.

## 2020-03-11 ENCOUNTER — Telehealth (INDEPENDENT_AMBULATORY_CARE_PROVIDER_SITE_OTHER): Payer: Medicare Other | Admitting: Internal Medicine

## 2020-03-11 ENCOUNTER — Ambulatory Visit: Payer: PRIVATE HEALTH INSURANCE

## 2020-03-11 DIAGNOSIS — I480 Paroxysmal atrial fibrillation: Secondary | ICD-10-CM

## 2020-03-11 NOTE — Telephone Encounter (Signed)
Spoke with the patient who states that he thinks he might be back in A Fib.He states that symptoms are a bit milder than they have previously been when he has been back in A Fib. He states that he started feeling some palpitations last night. They went away but reoccurred this morning. He states that he can feel that his heartbeat is irregular. He states HR is in the 60s. He states that he is not having any SOB, dizziness, or CP. He otherwise feels fine. Patient continues on diltiazem 360 mg daily and Eliquis 5 mg BID which I advised patient to continue on. I will let Dr. Rayann Heman know and we will call him back with any further recommendations.

## 2020-03-11 NOTE — Telephone Encounter (Signed)
Patient walked into the office wanting an EKG since he did not hear anything back after his phone call 1 hour ago. Patient was thinking he was back in Afib (see telephone encounter below). EKG done and he is in NSR. Will have this scanned to chart. Patient is anticoagulated and asymptomatic at this time. Patient instructed to continue to monitor and let us know if his Sx change or worsen. He is aware that he will be contacted after Dr. Rayann Heman reviews.

## 2020-03-11 NOTE — Telephone Encounter (Signed)
New Message   Patient is calling because he believes he is back in afib. He is wanting to know can he stop by for an EKG or what can he do. Please call to discuss   Patient c/o Palpitations:  High priority if patient c/o lightheadedness, shortness of breath, or chest pain  1) How long have you had palpitations/irregular HR/ Afib? Are you having the symptoms now? Some missed beats last evening it settled down but has came back most of the morning today  2) Are you currently experiencing lightheadedness, SOB or CP? No just feel heart is flip flopping   3) Do you have a history of afib (atrial fibrillation) or irregular heart rhythm?   4) Have you checked your BP or HR? (document readings if available): no, but he imagines the HR is around 70  5) Are you experiencing any other symptoms? No

## 2020-03-18 NOTE — Telephone Encounter (Signed)
Sinus with first degree AV block No changes at this time.

## 2020-03-26 ENCOUNTER — Other Ambulatory Visit: Payer: Self-pay | Admitting: Primary Care

## 2020-03-26 ENCOUNTER — Ambulatory Visit (INDEPENDENT_AMBULATORY_CARE_PROVIDER_SITE_OTHER): Payer: Medicare Other | Admitting: Primary Care

## 2020-03-26 ENCOUNTER — Ambulatory Visit: Payer: PRIVATE HEALTH INSURANCE | Admitting: Pulmonary Disease

## 2020-03-26 ENCOUNTER — Encounter: Payer: Self-pay | Admitting: Primary Care

## 2020-03-26 ENCOUNTER — Other Ambulatory Visit: Payer: Self-pay

## 2020-03-26 VITALS — BP 122/72 | HR 69 | Temp 98.5°F | Ht 72.0 in | Wt 244.4 lb

## 2020-03-26 DIAGNOSIS — R0683 Snoring: Secondary | ICD-10-CM

## 2020-03-26 DIAGNOSIS — J449 Chronic obstructive pulmonary disease, unspecified: Secondary | ICD-10-CM

## 2020-03-26 DIAGNOSIS — G4733 Obstructive sleep apnea (adult) (pediatric): Secondary | ICD-10-CM | POA: Insufficient documentation

## 2020-03-26 MED ORDER — MONTELUKAST SODIUM 10 MG PO TABS: 10.0000 mg | ORAL_TABLET | Freq: Every day | ORAL | 1 refills | Status: DC

## 2020-03-26 NOTE — Assessment & Plan Note (Signed)
-   Patient reports loud snoring and daytime fatigue. Epworth sleepiness scale 5/24 - Needs Home sleep study to assess for underlying sleep apnea  - We will call patient after sleep study to review results and make further recommendations

## 2020-03-26 NOTE — Patient Instructions (Addendum)
Recommendations: - Stop Trelegy  - Use Albuterol 2 puffs before activity that produces respiratory symptoms- make note if this helps  - Trial Singulair 10mg  at bedtime for possible underlying asthma/allergies. May help with dyspnea and wheezing symptoms (sent to [facility])  Orders: - Home sleep study re: Snoring   Follow-up: - 6 months with Dr. Valeta Harms we will call you after HST results     Montelukast oral tablets What is this medicine? MONTELUKAST (mon te LOO kast) is used to prevent and treat the symptoms of asthma. It is also used to treat allergies. Do not use for an acute asthma attack. This medicine may be used for other purposes; ask your health care provider or pharmacist if you have questions. COMMON BRAND NAME(S): Singulair What should I tell my health care provider before I take this medicine? They need to know if you have any of these conditions:  liver disease  an unusual or allergic reaction to montelukast, other medicines, foods, dyes, or preservatives  pregnant or trying to get pregnant  breast-feeding How should I use this medicine? This medicine should be given by mouth. Follow the directions on the prescription label. Take this medicine at the same time every day. You may take this medicine with or without meals. Do not chew the tablets. Do not stop taking your medicine unless your doctor tells you to. Talk to your pediatrician regarding the use of this medicine in children. Special care may be needed. While this drug may be prescribed for children as young as 1 years of age for selected conditions, precautions do apply. Overdosage: If you think you have taken too much of this medicine contact a poison control center or emergency room at once. NOTE: This medicine is only for you. Do not share this medicine with others. What if I miss a dose? If you miss a dose, skip it. Take your next dose at the normal time. Do not take extra or 2 doses at the same time to  make up for the missed dose. What may interact with this medicine?  anti-infectives like rifampin and rifabutin  medicines for seizures like phenytoin, phenobarbital, and carbamazepine This list may not describe all possible interactions. Give your health care provider a list of all the medicines, herbs, non-prescription drugs, or dietary supplements you use. Also tell them if you smoke, drink alcohol, or use illegal drugs. Some items may interact with your medicine. What should I watch for while using this medicine? Visit your doctor or health care professional for regular checks on your progress. Tell your doctor or health care professional if your allergy or asthma symptoms do not improve. Take your medicine even when you do not have symptoms. Do not stop taking any of your medicine(s) unless your doctor tells you to. If you have asthma, talk to your doctor about what to do in an acute asthma attack. Always have your inhaled rescue medicine for asthma attacks with you. Patients and their families should watch for new or worsening thoughts of suicide or depression. Also watch for sudden changes in feelings such as feeling anxious, agitated, panicky, irritable, hostile, aggressive, impulsive, severely restless, overly excited and hyperactive, or not being able to sleep. Any worsening of mood or thoughts of suicide or dying should be reported to your health care professional right away. What side effects may I notice from receiving this medicine? Side effects that you should report to your doctor or health care professional as soon as possible:  allergic reactions  like skin rash or hives, or swelling of the face, lips, or tongue  breathing problems  changes in emotions or moods  confusion  depressed mood  fever or infection  hallucinations  joint pain  painful lumps under the skin  pain, tingling, numbness in the hands or feet  redness, blistering, peeling, or loosening of the skin,  including inside the mouth  restlessness  seizures  sleep walking  signs and symptoms of infection like fever; chills; cough; sore throat; flu-like illness  signs and symptoms of liver injury like dark yellow or brown urine; general ill feeling or flu-like symptoms; light-colored stools; loss of appetite; nausea; right upper belly pain; unusually weak or tired; yellowing of the eyes or skin  sinus pain or swelling  stuttering  suicidal thoughts or other mood changes  tremors  trouble sleeping  uncontrolled muscle movements  unusual bleeding or bruising  vivid or bad dreams Side effects that usually do not require medical attention (report to your doctor or health care professional if they continue or are bothersome):  dizziness  drowsiness  headache  runny nose  stomach upset  tiredness This list may not describe all possible side effects. Call your doctor for medical advice about side effects. You may report side effects to FDA at 1-800-FDA-1088. Where should I keep my medicine? Keep out of the reach of children. Store at room temperature between 15 and 30 degrees C (59 and 86 degrees F). Protect from light and moisture. Keep this medicine in the original bottle. Throw away any unused medicine after the expiration date. NOTE: This sheet is a summary. It may not cover all possible information. If you have questions about this medicine, talk to your doctor, pharmacist, or health care provider.  2020 Elsevier/Gold Standard (2018-11-10 12:54:33)

## 2020-03-26 NOTE — Progress Notes (Signed)
@Patient  ID: Casey [name], Dr., male    DOB: 04/23/1956, 64 y.o.   MRN: [id number]  Chief Complaint  Patient presents with  . Follow-up    Referring provider: Leeroy Cha  HPI: 64 year old male, never smoked.  Past medical history significant for community-acquired pneumonia right lower lung, acute respiratory failure with hypoxia, pulmonary embolism, hypertension, A. fib, GERD, chronic kidney disease stage II, hyperlipidemia, prediabetes.  Patient of Dr. Valeta Harms, last seen in office on 08/22/2019.  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 PMH of atrial fibrillation as well as a several week hospitalization for respiratory failure requiring lung biopsy by Dr. Arlyce Dice several years ago.  He was out of work for approximately 3 months.  There was a question of whether or not he developed hantavirus?  After a trip out [place].  He also has a history of a schwannoma removal from an intercostal nerve.  He has always had a elevated left hemidiaphragm. Father with history of DVT X 2. He was on xarelto in the past. He traveled to [place] recently. He felt some right calf pain, no swelling or warmth. The past persisted for the past two weeks. D-dimer was positive 3, US duplex with superficial thrombophebitis.  The patient is a Stage manager.  He does play tennis once a week.  He does feel that while he is playing tennis he gets more short of breath on occasion.  He has noticed some limitation in his physical activity when he significantly exerts himself.  He mainly just wants to make sure there is not anything wrong with his lungs that may cause him to be short of breath.  He did have a recent chest x-ray that looked normal as well as a VQ scan that was completed in the ED due to his elevation of his serum creatinine that was low probability.  He denies chest pain or palpitations.  He does state he knows when he is in atrial fibrillation.  He  is very sensitive to to knowing when he is in atrial fibrillation and has had ablation in the past.  He is a lifelong non-smoker with evidence of rather obstructed lung disease.  He has had significant injury to the lung in the past before due to a questionable viral/aspiration pneumonia type event that he ended up in the hospital requiring a long stay and ultimately underwent VATS.  One must consider a post inflammatory type small airway disease such as BO but he has no other symptoms.  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  OV 08/31/2018: Since last office visit patient was unfortunately mid to the hospital for hypoxemic episode as well as a lower lobe pneumonia with small infiltrate.  He was treated with antibiotics steroids and discharged.  Since that he has been last seen in the office he is also had cardiac evaluation to include left heart catheterization.  Patient had pulmonary function tests completed prior to office visit today which reveals a ratio of 68 and a FEV1 of 55% predicted.  Consistent with moderate obstructive disease.  Overall since his hospitalization he has been improving slowly.  Occasionally does feel that he has chest tightness.  He still occasionally has wheeze.  He does feel that he was breathing much better using a nebulizer in the hospital and would like to have one at home.  OV 03/28/2019: Since last office visit patient was admitted to the hospital for submassive PE.  Patient was in the at [facility] operating room developed abrupt onset of lightheadedness crash down and ultimately synced to the floor and about to perform a tonsillectomy.  He had a funny sensation in his chest and was taken to the emergency room.  CTA revealed submassive PE.  He was started on heparin.  Bilateral lower lobe pulmonary branches involved with a RV/LV ratio of 1.2.  Patient had multiple  hematologic studies completed during hospitalization.  Patient was discharged on Eliquis.  Today, is able to recall his whole encounter. His father had a DVT after a hip replacement and travel history from [place] develop after travel.  Patient at this time still feeling fatigued with significant exertion.  He has been walking around the neighborhood with his wife able to do up to 3 miles at a time.  OV 08/22/2019: Here today for follow up.  Patient still with ongoing complaints of dyspnea on exertion.  Today in the office we discussed this in detail.  We recently attempted to reach out to pathology to see if we could find his prior VATS pathology results from greater than 20 years ago.  Unfortunately pathology does not hold these specimens that long.  He does have his consultation at [facility] next week.  Prior to this having PFTs completed.  Hematologic evaluation completed by Dr. Irene Limbo was unrevealing for reasons of hypercoagulability.  Additionally, patient's case was discussed with pulmonologist colleague Lyda Jester, MD.  Patient has coronary CT scan planned for tomorrow.  Pulmonary function test with moderate obstructive disease ratio of 68, FEV1 56% currently managed with Trelegy inhaler. Patient has persistent dyspnea symptoms.  Despite patient's abnormal PFTs we have had no other clear reason for patient's ongoing dyspnea.  Patient has been referred to [facility] for evaluation by Dr. Corrin Parker.   Prior left upper lobe pulmonary nodule with groundglass.  Follow-up CT imaging was ordered but not yet completed, however lung nodule was small 5 mm in size.  Prior pulmonary infiltrate resolved.  Acute bilateral submassive PE occurred in August 2020 currently on anticoagulation, hypercoagulability work-up negative   03/26/2020- Interim hx Patient presents today for regular follow-up. Since last visit he was seen by Dr. Corrin Parker with [facility].  He completed cardiopulmonary exercise stress test that showed normal exercise  tolerance with outstanding cardiac function. There was no evidence of inducible pulmonary hypertension with exercise. Although exercise tolerance was normal it appeared that he had ventilatory limitation. Recommend ongoing exercise program which he is already doing. He has been off Trelegy for months and hasn't noticed much of a difference in his breathing. He gets out of breath when walking distances. Occasional subtle wheezing. He remains active, walking 3 miles 3-4 times a week with his wife. States that he can not always keep pace with her. Pulmonary function testing showed moderate obstructive lung disease.  Alpha 1 phenotype is MM, normal. He visited with pulmonary rehab once but this was put on hold d/t the covid pandemic. He does not necessarily feel he would get anything more from pulmonary rehab than what he is already doing at home. He is noticeably more tired since retiring and requiring naps during the day. He does not feel overly coernced by this but his wife has mentioned it to him. Reports snoring at night. No dry mouth. Wakes up several times to use restroom. No family hx sleep apnea. Epworth 5/24. He remains on Eliquis  5mg  twice daily. Repeat CT chest in April 2021 showed decreased left upper lobe nodule 48mm (previously 44mm). Patient is low risk and nodules is sub centimeter. No further follow-up needed.    Chest imaging: 05/31/2018 ventilation perfusion scan: Low probability 05/31/2018 chest x-ray: No acute abnormalities  10/25/2018: High-resolution CT chest No evidence of interstitial lung disease resolution of right lung base opacity seen previously, left upper lobe lung nodule The patient's images have been independently reviewed by me.    03/14/2019: CTA chest Bilateral lower lobe acute pulmonary emboli with evidence of right ventricular strain RV LV ratio 1.2. Left upper lobe 5 mm sub-solid pulmonary nodule with groundglass recommending 55-month follow-up. The patient's images have  been independently reviewed by me.   10/31/19 CT chest wo contrast- Decreased size of left upper lobe nodule with a mean diameter of 4 mm, previously 6 mm and favored to reflect an infectious/inflammatory process. No new pulmonary nodules identified. Nodules less than 6 mm do not require routine follow-up in low risk patients. However, a noncontrast chest CT at 12 months is recommended for high risk patients  Pulmonary function testing: - FEV1 56%, ratio 68. No bronchodilator response. - Alpha 1 phenotype is MM  Allergies  Allergen Reactions  . Aldactone [Spironolactone]     Breast tissue enlargement  . Codeine Other (See Comments)    Headache   . Keflex [Cephalexin] Diarrhea  . Lisinopril Cough  . Nexium [Esomeprazole Magnesium] Diarrhea  . Sulfa Antibiotics Rash  . Sulfa Drugs Cross Reactors Rash    Immunization History  Administered Date(s) Administered  . Influenza, High Dose Seasonal PF 05/12/2018  . Influenza,inj,Quad PF,6+ Mos 05/02/2019, 05/09/2019  . PFIZER SARS-COV-2 Vaccination 07/18/2019, 08/08/2019    Past Medical History:  Diagnosis Date  . 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HCC)    ongoing evaluation for elevated R>L adosterone secretion  . Arthritis   . Atrial enlargement, bilateral   . Atrial fibrillation (HCC)    paroxysmal  . Atrial flutter ([place])   . Complication of anesthesia    could not urinate after UHR, needed cath, mental fog sinc cervical fusion 08-09-16   . Dysrhythmia    a-fib-had ablation x2  . GERD (gastroesophageal reflux disease)   . Hyperaldosteronism ([place])   . Hypertension   . Kidney cysts    "multiple"  . Nephrolithiasis   . Pneumonia 1990's  . Pre-diabetes   . Prostatitis   . Renal insufficiency    "Cr 1.6"    Tobacco History: Social History   Tobacco Use  Smoking Status Never Smoker  Smokeless Tobacco Never Used   Counseling given: Not Answered   Outpatient Medications Prior to Visit  Medication Sig Dispense Refill  .  allopurinol (ZYLOPRIM) 100 MG tablet Take 100 mg by mouth 2 (two) times daily.     Marland Kitchen apixaban (ELIQUIS) 5 MG TABS tablet Take 1 tablet (5 mg total) by mouth 2 (two) times daily. 180 tablet 3  . Ascorbic Acid (VITAMIN C) 1000 MG tablet Take 1,000 mg by mouth daily.    Marland Kitchen atorvastatin (LIPITOR) 10 MG tablet Take 10 mg by mouth every evening.     Marland Kitchen CIALIS 20 MG tablet Take 20 mg by mouth daily as needed for erectile dysfunction.   11  . diltiazem (CARDIZEM CD) 360 MG 24 hr capsule Take 360 mg by mouth every evening.  7  . eplerenone (INSPRA) 50 MG tablet Take 50 mg by mouth 2 (two) times daily.    . Glucosamine HCl (  GLUCOSAMINE PO) Take by mouth.    . hydrochlorothiazide (MICROZIDE) 12.5 MG capsule Take 12.5 mg by mouth daily.    Marland Kitchen losartan (COZAAR) 100 MG tablet Take 100 mg by mouth daily.    Marland Kitchen MAGNESIUM PO Take 1 capsule by mouth daily.    . Multiple Vitamins-Minerals (ZINC PO) Take 1 tablet by mouth 2 (two) times daily.    Marland Kitchen omeprazole (PRILOSEC) 40 MG capsule Take 40 mg by mouth daily before supper.     . Potassium Gluconate 2.5 MEQ TABS Take 3 tablets by mouth 2 (two) times daily.    . sertraline (ZOLOFT) 50 MG tablet Take 1 tablet (50 mg total) by mouth every evening. (Patient taking differently: Take 100 mg by mouth every evening. 100mg  daily) 90 tablet 1  . vitamin B-12 (CYANOCOBALAMIN) 1000 MCG tablet Take 1,000 mcg by mouth daily.    . cephALEXin (KEFLEX) 500 MG capsule Take 500 mg by mouth 4 (four) times daily. (Patient not taking: Reported on 03/26/2020)    . enoxaparin (LOVENOX) 40 MG/0.4ML injection Inject 0.4 mLs (40 mg total) into the skin daily for 10 doses. Hold ELiquis 72h prior to planned procedure and start prophylactic lovenox 40mg  [place] daily with last lovenox dose 24h prior to surgical procedure. 2 mL 1  . Fluticasone-Umeclidin-Vilant (TRELEGY ELLIPTA) 100-62.5-25 MCG/INH AEPB Inhale 1 puff into the lungs daily. (Patient not taking: Reported on 03/26/2020) 28 each 5   No  facility-administered medications prior to visit.   Review of Systems  Review of Systems  Constitutional: Positive for fatigue. Negative for fever and unexpected weight change.  Respiratory: Positive for shortness of breath and wheezing. Negative for apnea, cough and choking.        Dyspnea on exertion, intermittent wheezing  Cardiovascular: Negative.     Physical Exam  BP 122/72 (BP Location: Left Arm, Cuff Size: Normal)   Pulse 69   Temp 98.5 F (36.9 C) (Oral)   Ht 6' (1.829 m)   Wt 244 lb 6.4 oz (110.9 kg)   SpO2 95%   BMI 33.15 kg/m  Physical Exam Constitutional:      General: He is not in acute distress.    Appearance: Normal appearance. He is not ill-appearing.  HENT:     Head: Normocephalic and atraumatic.     Right Ear: Tympanic membrane and ear canal normal. There is no impacted cerumen.     Left Ear: Tympanic membrane and ear canal normal. There is no impacted cerumen.     Nose: Nose normal.     Mouth/Throat:     Mouth: Mucous membranes are moist.     Pharynx: Oropharynx is clear. No oropharyngeal exudate or posterior oropharyngeal erythema.  Cardiovascular:     Rate and Rhythm: Normal rate and regular rhythm.     Heart sounds: No murmur heard.      Comments: Regular rate and rhythm; Wearing compression stockings Pulmonary:     Effort: Pulmonary effort is normal.     Breath sounds: No wheezing, rhonchi or rales.     Comments: Clear to auscultation  Musculoskeletal:        General: Normal range of motion.     Cervical back: Normal range of motion and neck supple.  Lymphadenopathy:     Cervical: No cervical adenopathy.  Skin:    General: Skin is warm.  Neurological:     General: No focal deficit present.     Mental Status: He is alert and oriented to person, place, and time.  Mental status is at baseline.  Psychiatric:        Mood and Affect: Mood normal.        Behavior: Behavior normal.        Thought Content: Thought content normal.        Judgment:  Judgment normal.        Lab Results:  CBC    Component Value Date/Time   WBC 9.9 04/30/2019 1159   RBC 5.08 04/30/2019 1159   HGB 14.4 04/30/2019 1159   HGB 13.8 07/20/2018 1313   HCT 44.5 04/30/2019 1159   HCT 42.0 07/20/2018 1313   PLT 250 04/30/2019 1159   PLT 269 07/20/2018 1313   MCV 87.6 04/30/2019 1159   MCV 87 07/20/2018 1313   MCH 28.3 04/30/2019 1159   MCHC 32.4 04/30/2019 1159   RDW 15.8 (H) 04/30/2019 1159   RDW 15.2 07/20/2018 1313   LYMPHSABS 2.0 04/30/2019 1159   MONOABS 0.6 04/30/2019 1159   EOSABS 0.1 04/30/2019 1159   BASOSABS 0.1 04/30/2019 1159    BMET    Component Value Date/Time   NA 137 08/23/2019 0929   NA 141 08/20/2019 1215   K 3.6 08/23/2019 0929   CL 105 08/23/2019 0929   CO2 23 08/23/2019 0929   GLUCOSE 116 (H) 08/23/2019 0929   BUN 29 (H) 08/23/2019 0929   BUN 30 (H) 08/20/2019 1215   CREATININE 1.50 (H) 08/23/2019 0929   CREATININE 1.58 (H) 04/30/2019 1159   CREATININE 1.50 (H) 07/30/2011 0939   CALCIUM 9.0 08/23/2019 0929   GFRNONAA 48 (L) 08/23/2019 0929   GFRNONAA 46 (L) 04/30/2019 1159   GFRAA 56 (L) 08/23/2019 0929   GFRAA 53 (L) 04/30/2019 1159    BNP    Component Value Date/Time   BNP 48.7 08/16/2018 1800    ProBNP    Component Value Date/Time   PROBNP 46.0 08/31/2018 1022    Imaging: No results found.   Assessment & Plan:   Moderate COPD (chronic obstructive pulmonary disease) (HCC) - Non-smoker/ Alpha-1 phenotype MM, normal. - Moderate obstruction on pulmonary function testing with no BD respnse. Normal diffusion capacity.  - Patient was tried on Trelegy without noticeable change in breathing. This has been discontinued.  - Recommend Singulair 10mg  at bedtime for possible underlying asthma/alelrgies and use Albuterol hfa 2 puffs prior to activity that causes respiratory symptoms  - FU in 6 months with Dr. Valeta Harms  Loud snoring - Patient reports loud snoring and daytime fatigue. Epworth sleepiness scale  5/24 - Needs Home sleep study to assess for underlying sleep apnea  - We will call patient after sleep study to review results and make further recommendations    Martyn Ehrich, NP 03/26/2020

## 2020-03-26 NOTE — Assessment & Plan Note (Addendum)
-   Non-smoker/ Alpha-1 phenotype MM, normal. - Moderate obstruction on pulmonary function testing with no BD respnse. Normal diffusion capacity.  - Patient was tried on Trelegy without noticeable change in breathing. This has been discontinued.  - Recommend Singulair 10mg  at bedtime for possible underlying asthma/alelrgies and use Albuterol hfa 2 puffs prior to activity that causes respiratory symptoms  - FU in 6 months with Dr. Valeta Harms

## 2020-03-31 NOTE — Progress Notes (Signed)
Thanks for seeing Dr. [name].  Garner Nash, DO [facility] 03/31/2020 2:32 PM

## 2020-04-22 DIAGNOSIS — M7989 Other specified soft tissue disorders: Secondary | ICD-10-CM

## 2020-04-28 ENCOUNTER — Ambulatory Visit: Payer: PRIVATE HEALTH INSURANCE

## 2020-04-28 ENCOUNTER — Other Ambulatory Visit: Payer: Self-pay

## 2020-04-28 DIAGNOSIS — G4733 Obstructive sleep apnea (adult) (pediatric): Secondary | ICD-10-CM | POA: Diagnosis not present

## 2020-04-28 DIAGNOSIS — R0683 Snoring: Secondary | ICD-10-CM

## 2020-04-29 DIAGNOSIS — G4733 Obstructive sleep apnea (adult) (pediatric): Secondary | ICD-10-CM | POA: Diagnosis not present

## 2020-05-05 ENCOUNTER — Telehealth: Payer: Self-pay | Admitting: Pulmonary Disease

## 2020-05-05 NOTE — Telephone Encounter (Signed)
PCCM: Yes I am happy too Ok to refill in my name   Thanks Garner Nash, DO [facility] 05/05/2020 6:32 PM

## 2020-05-05 NOTE — Telephone Encounter (Signed)
Spoke with pt. He would like to know if Dr. Valeta Harms would take over his Eliquis prescription. His PCP will no longer refill it. Pt doesn't need any refills at this time.  Dr. Valeta Harms - please advise. Thanks.

## 2020-05-06 NOTE — Telephone Encounter (Signed)
Called and left a detailed msg on machine with Dr Juline Patch response ok per DPR  Nothing further needed

## 2020-05-20 ENCOUNTER — Encounter: Payer: Self-pay | Admitting: Podiatry

## 2020-05-20 ENCOUNTER — Ambulatory Visit (INDEPENDENT_AMBULATORY_CARE_PROVIDER_SITE_OTHER): Payer: PRIVATE HEALTH INSURANCE | Admitting: Podiatry

## 2020-05-20 ENCOUNTER — Ambulatory Visit (INDEPENDENT_AMBULATORY_CARE_PROVIDER_SITE_OTHER): Payer: PRIVATE HEALTH INSURANCE

## 2020-05-20 ENCOUNTER — Other Ambulatory Visit: Payer: Self-pay

## 2020-05-20 DIAGNOSIS — M778 Other enthesopathies, not elsewhere classified: Secondary | ICD-10-CM | POA: Diagnosis not present

## 2020-05-20 NOTE — Telephone Encounter (Signed)
This study was read on 10/4 and showed moderate OSA with AHI 23/hour Desaturations were also noted without respiratory events suggesting underlying cardiopulmonary disease.  Deferred to treating providers to discuss further treatment including CPAP

## 2020-05-20 NOTE — Telephone Encounter (Signed)
Dr. Elsworth Soho please advise on patients sleep study   it has been 3 weeks.  are my results available?  thanks always  Jodi Marble MD DOB [id number]

## 2020-05-20 NOTE — Telephone Encounter (Signed)
Please let Dr. [name] know his sleep study showed moderate OSA. He had on average 23 events an hours. Treatment options include weight loss, side sleeping position, oral appliance, CPAP therapy or referral to ENT for possible surgical consideration. If he is symptomatic would recommend starting CPAP now auto titrate 5-15cm h20, mask of choice, supplies, heated humidity. Follow-up in 31-90 days with sleep provider either Dr. Halford Chessman, Elsworth Soho or [place].

## 2020-05-20 NOTE — Progress Notes (Signed)
Subjective:  Patient ID: Casey [name], Dr., male    DOB: 1956/03/23,  MRN: [id number] HPI Chief Complaint  Patient presents with   Foot Pain    5th met base bilateral (R>L) - aching x 2 months, went on a hike in [place] and noticed increased pain afterwards, wears compression stockings (sometimes makes area feel sore)   New Patient (Initial Visit)    Est pt 95    64 y.o. male presents with the above complaint.   ROS: Denies fever chills nausea vomiting muscle aches pains calf pain back pain chest pain shortness of breath.  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[place])    ongoing evaluation for elevated R>L adosterone secretion   Arthritis    Atrial enlargement, bilateral    Atrial fibrillation (HCC)    paroxysmal   Atrial flutter (HCC)    Complication of anesthesia    could not urinate after UHR, needed cath, mental fog sinc cervical fusion 08-09-16    Dysrhythmia    a-fib-had ablation x2   GERD (gastroesophageal reflux disease)    Hyperaldosteronism ([place])    Hypertension    Kidney cysts    "multiple"   Nephrolithiasis    Pneumonia 1990's   Pre-diabetes    Prostatitis    Renal insufficiency    "Cr 1.6"   Past Surgical History:  Procedure Laterality Date   ABLATION     ATRIAL FIBRILLATION ABLATION N/A 09/28/2012   Procedure: ATRIAL FIBRILLATION ABLATION;  Surgeon: Thompson Grayer, MD;  Location: [facility] CATH LAB;  Service: Cardiovascular;  Laterality: N/A;   atrial fibrillation ablation with CTI ablation  05/27/11, 09/28/12   ablation for afib and atrial flutter by Dr Norman Herrlich SURGERY  08/09/2016   C 5 6  C 6 to C 7 cervical fusion   CARDIOVERSION  07/28/2011   Procedure: CARDIOVERSION;  Surgeon: Lelon Perla, MD;  Location: [place];  Service: Cardiovascular;  Laterality: N/A;   COLONOSCOPY WITH PROPOFOL N/A 03/03/2015   Procedure: COLONOSCOPY WITH PROPOFOL;  Surgeon: Garlan Fair, MD;  Location: [facility];   Service: Gastroenterology;  Laterality: N/A;   double j stent placement  06/2004   left   ESOPHAGOGASTRODUODENOSCOPY (EGD) WITH PROPOFOL N/A 03/03/2015   Procedure: ESOPHAGOGASTRODUODENOSCOPY (EGD) WITH PROPOFOL;  Surgeon: Garlan Fair, MD;  Location: [facility];  Service: Gastroenterology;  Laterality: N/A;   ESOPHAGOGASTRODUODENOSCOPY (EGD) WITH PROPOFOL N/A 10/25/2016   Procedure: ESOPHAGOGASTRODUODENOSCOPY (EGD) WITH PROPOFOL;  Surgeon: Garlan Fair, MD;  Location: [facility]OS[facility];  Service: Endoscopy;  Laterality: N/A;   INGUINAL HERNIA REPAIR     "as a child; ? right"   INGUINAL HERNIA REPAIR Left 06/23/2016   Procedure: OPEN LEFT INGUINAL HERNIA  REPAIR WITH MESH;  Surgeon: Johnathan Hausen, MD;  Location: [place];  Service: General;  Laterality: Left;   INSERTION OF MESH Left 06/23/2016   Procedure: INSERTION OF MESH;  Surgeon: Johnathan Hausen, MD;  Location: [place];  Service: General;  Laterality: Left;   LEFT HEART CATH AND CORONARY ANGIOGRAPHY N/A 07/25/2018   Procedure: LEFT HEART CATH AND CORONARY ANGIOGRAPHY;  Surgeon: Belva Crome, MD;  Location: [place] CV LAB;  Service: Cardiovascular;  Laterality: N/A;   LUNG BIOPSY  1993   NASAL SEPTOPLASTY W/ TURBINOPLASTY  07/2006   prostate biopsy  2011   schwannoma removal  ~01/2010   left thorax   TEE WITHOUT CARDIOVERSION N/A 09/27/2012   Procedure: TRANSESOPHAGEAL  ECHOCARDIOGRAM (TEE);  Surgeon: Larey Dresser, MD;  Location: [place];  Service: Cardiovascular;  Laterality: N/A;   ULTRASOUND GUIDANCE FOR VASCULAR ACCESS  07/25/2018   Procedure: Ultrasound Guidance For Vascular Access;  Surgeon: Belva Crome, MD;  Location: [place] CV LAB;  Service: Cardiovascular;;   UMBILICAL HERNIA REPAIR  07/2006    Current Outpatient Medications:    oxybutynin (DITROPAN-XL) 10 MG 24 hr tablet, Take 1 tablet by mouth daily., Disp: , Rfl:    allopurinol (ZYLOPRIM) 100 MG tablet,  Take 100 mg by mouth 2 (two) times daily. , Disp: , Rfl:    apixaban (ELIQUIS) 5 MG TABS tablet, Take 1 tablet (5 mg total) by mouth 2 (two) times daily., Disp: 180 tablet, Rfl: 3   Ascorbic Acid (VITAMIN C) 1000 MG tablet, Take 1,000 mg by mouth daily., Disp: , Rfl:    atorvastatin (LIPITOR) 10 MG tablet, Take 10 mg by mouth every evening. , Disp: , Rfl:    CIALIS 20 MG tablet, Take 20 mg by mouth daily as needed for erectile dysfunction. , Disp: , Rfl: 11   diltiazem (CARDIZEM CD) 360 MG 24 hr capsule, Take 360 mg by mouth every evening., Disp: , Rfl: 7   enoxaparin (LOVENOX) 40 MG/0.4ML injection, Inject 0.4 mLs (40 mg total) into the skin daily for 10 doses. Hold ELiquis 72h prior to planned procedure and start prophylactic lovenox 40mg  [place] daily with last lovenox dose 24h prior to surgical procedure., Disp: 2 mL, Rfl: 1   eplerenone (INSPRA) 50 MG tablet, Take 50 mg by mouth 2 (two) times daily., Disp: , Rfl:    Glucosamine HCl (GLUCOSAMINE PO), Take by mouth., Disp: , Rfl:    hydrochlorothiazide (MICROZIDE) 12.5 MG capsule, Take 12.5 mg by mouth daily., Disp: , Rfl:    losartan (COZAAR) 100 MG tablet, Take 100 mg by mouth daily., Disp: , Rfl:    MAGNESIUM PO, Take 1 capsule by mouth daily., Disp: , Rfl:    montelukast (SINGULAIR) 10 MG tablet, TAKE 1 TABLET(10 MG) BY MOUTH AT BEDTIME, Disp: 90 tablet, Rfl: 3   Multiple Vitamins-Minerals (ZINC PO), Take 1 tablet by mouth 2 (two) times daily., Disp: , Rfl:    omeprazole (PRILOSEC) 40 MG capsule, Take 40 mg by mouth daily before supper. , Disp: , Rfl:    Potassium Gluconate 2.5 MEQ TABS, Take 3 tablets by mouth 2 (two) times daily., Disp: , Rfl:    PROAIR HFA 108 (90 Base) MCG/ACT inhaler, SMARTSIG:2 Inhalation Via Inhaler Every 6 Hours PRN, Disp: , Rfl:    sertraline (ZOLOFT) 50 MG tablet, Take 1 tablet (50 mg total) by mouth every evening. (Patient taking differently: Take 100 mg by mouth every evening. 100mg  daily), Disp: 90  tablet, Rfl: 1   vitamin B-12 (CYANOCOBALAMIN) 1000 MCG tablet, Take 1,000 mcg by mouth daily., Disp: , Rfl:   Allergies  Allergen Reactions   Aldactone [Spironolactone]     Breast tissue enlargement   Codeine Other (See Comments)    Headache    Keflex [Cephalexin] Diarrhea   Lisinopril Cough   Nexium [Esomeprazole Magnesium] Diarrhea   Sulfa Antibiotics Rash   Sulfa Drugs Cross Reactors Rash   Review of Systems Objective:  There were no vitals filed for this visit.  General: Well developed, nourished, in no acute distress, alert and oriented x3   Dermatological: Skin is warm, dry and supple bilateral. Nails x 10 are well maintained; remaining integument appears unremarkable at this time. There are  no open sores, no preulcerative lesions, no rash or signs of infection present.  Vascular: Dorsalis Pedis artery and Posterior Tibial artery pedal pulses are 2/4 bilateral with immedate capillary fill time. Pedal hair growth present. No varicosities and no lower extremity edema present bilateral.   Neruologic: Grossly intact via light touch bilateral. Vibratory intact via tuning fork bilateral. Protective threshold with Semmes Wienstein monofilament intact to all pedal sites bilateral. Patellar and Achilles deep tendon reflexes 2+ bilateral. No Babinski or clonus noted bilateral.   Musculoskeletal: No gross boney pedal deformities bilateral. No pain, crepitus, or limitation noted with foot and ankle range of motion bilateral. Muscular strength 5/5 in all groups tested bilateral.  Pain on palpation with fluctuance on the fifth metatarsal base of the right foot.  There is mild erythema and warmth surrounding this.  The peroneal tendons appear to be intact.  Gait: Unassisted, Nonantalgic.    Radiographs:  Radiographs taken today demonstrate soft tissue increase in density at the fifth metatarsal base right foot over the left.  No significant acute findings on these radiographs.   Some osteoarthritic changes at the first metatarsophalangeal joint chronic in nature.  Assessment & Plan:   Assessment: Bursitis fifth metatarsal right foot.  Pes planus bilateral.  Plan: Discussed etiology pathology and surgical therapies at this point we discussed appropriate shoe gear with him being that he has a flat straight foot he needs to make sure his not being put in current shoe gear because of rubbing and irritation of the fifth metatarsal of the right foot.  I injected this area today with 2 mg of dexamethasone and local anesthetic he will follow up with me in the near future if needed.     Sophiya Morello T. [place], [place]

## 2020-05-21 NOTE — Telephone Encounter (Signed)
Dr. Valeta Harms, It looks like Dr. Bari Mantis first availability is going to be December 3rd.  Do you want me to see if another provider has an opening sooner?  Please advise.  Thank you.

## 2020-05-22 NOTE — Telephone Encounter (Signed)
Dr. Alva please advise.  

## 2020-05-22 NOTE — Telephone Encounter (Signed)
Since auto CPAP has been ordered by Casey [name], okay to keep December appointment with me and I can assess how he is doing on CPAP. If he desires sooner appointment, please let me know and I can try to work him in

## 2020-05-22 NOTE — Telephone Encounter (Signed)
I have not yet started CPAP, but would appreciate an appointment with Dr. Elsworth Soho at his convenience.  thanks,  Jodi Marble MD   Dr. Elsworth Soho please advise

## 2020-06-25 DIAGNOSIS — M25521 Pain in right elbow: Secondary | ICD-10-CM | POA: Insufficient documentation

## 2020-06-25 DIAGNOSIS — M7712 Lateral epicondylitis, left elbow: Secondary | ICD-10-CM | POA: Insufficient documentation

## 2020-06-26 ENCOUNTER — Other Ambulatory Visit: Payer: Self-pay

## 2020-06-26 ENCOUNTER — Other Ambulatory Visit: Payer: Medicare Other

## 2020-06-26 ENCOUNTER — Encounter: Payer: Self-pay | Admitting: Pulmonary Disease

## 2020-06-26 ENCOUNTER — Ambulatory Visit (INDEPENDENT_AMBULATORY_CARE_PROVIDER_SITE_OTHER): Payer: Medicare Other | Admitting: Pulmonary Disease

## 2020-06-26 VITALS — BP 118/76 | HR 79 | Temp 98.9°F | Ht 72.5 in | Wt 245.6 lb

## 2020-06-26 DIAGNOSIS — J9611 Chronic respiratory failure with hypoxia: Secondary | ICD-10-CM

## 2020-06-26 DIAGNOSIS — G4733 Obstructive sleep apnea (adult) (pediatric): Secondary | ICD-10-CM

## 2020-06-26 NOTE — Progress Notes (Signed)
Subjective:    Patient ID: Casey [name], Dr., male    DOB: 1956/04/22, 64 y.o.   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 Epworth sleepiness score is 11.  He is now retired and can take a nap as needed.  Loud snoring has been noted by his wife especially if he consumes alcohol. Bedtime is between 10 and 11 PM, sleep latency is minimal he sleeps on his back with 1-2 pillows, reports nocturia 2-3 awakenings and is out of bed between 8 and 9 AM feeling rested with dryness of mouth but denies headaches. Naps are refreshing. He had a septoplasty in the remote past There is no history suggestive of cataplexy, sleep paralysis or parasomnias    Significant tests/ events reviewed  05/2018 ventilation perfusion scan: Low probability  10/2018: HRCT chest No evidence of interstitial lung disease resolution of right lung base opacity seen previously, left upper lobe lung nodule  03/14/2019: CTA chest Bilateral lower lobe acute pulmonary emboli with evidence of right ventricular strain RV LV ratio 1.2. Left upper lobe 5 mm sub-solid pulmonary nodule with groundglass   10/2019 CT chest wo contrast- Decreased size of left upper lobe nodule with a mean diameter of 4 mm, previously 6 mm and favored to reflect an infectious/inflammatory process.   08/2018 PFT:  FEV1 56%, ratio 68. No bronchodilator response. - Alpha 1 phenotype is MM  CPET ([facility] ) 10/2019 >> MVV 100%, pulmonary limitation to exercise, ratio 61, FEV1 59%, FVC 74%  HST 04/2020 >> AHI  23/h , 103 minutes with saturation less than 89%   Past Medical History:  Diagnosis Date  . 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HCC)    ongoing evaluation for elevated R>L adosterone secretion  . Arthritis   . Atrial enlargement, bilateral   . Atrial fibrillation (HCC)    paroxysmal  . Atrial flutter ([place])   . Complication of anesthesia    could not urinate after UHR, needed cath, mental fog sinc cervical fusion 08-09-16   . Dysrhythmia    a-fib-had ablation x2  . GERD (gastroesophageal reflux disease)   . Hyperaldosteronism ([place])   . Hypertension   . Kidney cysts    "multiple"  . Nephrolithiasis   . Pneumonia 1990's  . Pre-diabetes   . Prostatitis   . Renal insufficiency    "Cr 1.6"     Past Surgical History:  Procedure Laterality Date  . ABLATION    . ATRIAL FIBRILLATION ABLATION N/A 09/28/2012   Procedure: ATRIAL FIBRILLATION ABLATION;  Surgeon: Thompson Grayer, MD;  Location: [facility] CATH LAB;  Service: Cardiovascular;  Laterality: N/A;  . atrial fibrillation ablation with CTI ablation  05/27/11, 09/28/12   ablation for afib and atrial flutter by Dr Rayann Heman  . BACK SURGERY  08/09/2016   C 5 6  C 6 to C 7 cervical fusion  . CARDIOVERSION  07/28/2011   Procedure: CARDIOVERSION;  Surgeon: Lelon Perla, MD;  Location: [place];  Service: Cardiovascular;  Laterality: N/A;  . COLONOSCOPY WITH PROPOFOL N/A 03/03/2015   Procedure: COLONOSCOPY WITH PROPOFOL;  Surgeon:  Garlan Fair, MD;  Location: [facility];  Service: Gastroenterology;  Laterality: N/A;  . double j stent placement  06/2004   left  . ESOPHAGOGASTRODUODENOSCOPY (EGD) WITH PROPOFOL N/A 03/03/2015   Procedure: ESOPHAGOGASTRODUODENOSCOPY (EGD) WITH PROPOFOL;  Surgeon: Garlan Fair, MD;  [facility];  Service: Gastroenterology;  Laterality: N/A;  . ESOPHAGOGASTRODUODENOSCOPY (EGD) WITH PROPOFOL N/A 10/25/2016   Procedure: ESOPHAGOGASTRODUODENOSCOPY (EGD) WITH PROPOFOL;  Surgeon: Garlan Fair, MD;  [facility]  ENDOS[facility];  Service: Endoscopy;  Laterality: N/A;  . INGUINAL HERNIA REPAIR     "as a child; ? right"  . INGUINAL HERNIA REPAIR Left 06/23/2016   Procedure: OPEN LEFT INGUINAL HERNIA  REPAIR WITH MESH;  Surgeon: Johnathan Hausen, MD;  Location: [place];  Service: General;  Laterality: Left;  . INSERTION OF MESH Left 06/23/2016   Procedure: INSERTION OF MESH;  Surgeon: Johnathan Hausen, MD;  Location: [place];  Service: General;  Laterality: Left;  . LEFT HEART CATH AND CORONARY ANGIOGRAPHY N/A 07/25/2018   Procedure: LEFT HEART CATH AND CORONARY ANGIOGRAPHY;  Surgeon: Belva Crome, MD;  Location: [place] CV LAB;  Service: Cardiovascular;  Laterality: N/A;  . LUNG BIOPSY  1993  . NASAL SEPTOPLASTY W/ TURBINOPLASTY  07/2006  . prostate biopsy  2011  . schwannoma removal  ~01/2010   left thorax  . TEE WITHOUT CARDIOVERSION N/A 09/27/2012   Procedure: TRANSESOPHAGEAL ECHOCARDIOGRAM (TEE);  Surgeon: Larey Dresser, MD;  Location: [place];  Service: Cardiovascular;  Laterality: N/A;  . ULTRASOUND GUIDANCE FOR VASCULAR ACCESS  07/25/2018   Procedure: Ultrasound Guidance For Vascular Access;  Surgeon: Belva Crome, MD;  Location: [place] [facility] [place] CV LAB;  Service: Cardiovascular;;  . UMBILICAL HERNIA REPAIR  07/2006    Allergies  Allergen Reactions  . Aldactone [Spironolactone]     Breast tissue enlargement  . Codeine Other (See Comments)    Headache   . Keflex [Cephalexin] Diarrhea  . Lisinopril Cough  . Nexium [Esomeprazole Magnesium] Diarrhea  . Sulfa Antibiotics Rash  . Sulfa Drugs Cross Reactors Rash    Social History   Socioeconomic History  . Marital status: Married    Spouse name: Not on file  . Number of children: Not on file  . Years of education: Not on file  . Highest education level: Not on file  Occupational History  . Not on file  Tobacco Use  . Smoking status: Never Smoker  . Smokeless tobacco: Never Used  Vaping Use  .  Vaping Use: Never used  Substance and Sexual Activity  . Alcohol use: Yes    Alcohol/week: 2.0 standard drinks    Types: 2 Shots of liquor per week    Comment: occasional, 05-11-2016 Per pt 3 drinks per wk  . Drug use: No    Comment: 05-11-2016 per pt no  . Sexual activity: Yes  Other Topics Concern  . 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   Social Determinants of Health   Financial Resource Strain:   . Difficulty of Paying Living Expenses: Not on file  Food Insecurity:   . Worried About Charity fundraiser in the Last Year: Not on file  . Ran Out of Food in the Last Year: Not on file  Transportation Needs:   . Lack of Transportation (Medical): Not on file  . Lack of Transportation (Non-Medical): Not on file  Physical Activity:   . Days of Exercise per Week: Not on file  . Minutes of Exercise per Session: Not on file  Stress:   . Feeling of Stress : Not on file  Social Connections:   . Frequency of Communication with Friends and Family: Not on file  . Frequency of Social Gatherings with Friends and Family: Not on file  . Attends Religious Services: Not on file  . Active Member of Clubs or Organizations: Not on file  . Attends Archivist Meetings: Not on file  . Marital Status: Not on file  Intimate Partner Violence:   . Fear of Current or Ex-Partner: Not on file  . Emotionally Abused: Not on file  . Physically Abused: Not on file  . Sexually Abused: Not on file    Review of Systems   Constitutional: negative for anorexia, fevers and sweats  Eyes: negative for irritation, redness and visual disturbance  Ears, nose, mouth, throat, and face: negative for earaches, epistaxis, nasal congestion and sore throat  Respiratory: negative for cough, sputum and wheezing  Cardiovascular: negative for chest pain,  lower extremity edema, orthopnea, palpitations and syncope  Gastrointestinal: negative  for abdominal pain, constipation, diarrhea, melena, nausea and vomiting  Genitourinary:negative for dysuria, frequency and hematuria  Hematologic/lymphatic: negative for bleeding, easy bruising and lymphadenopathy  Musculoskeletal:negative for arthralgias, muscle weakness and stiff joints  Neurological: negative for coordination problems, gait problems, headaches and weakness  Endocrine: negative for diabetic symptoms including polydipsia, polyuria and weight loss     Objective:   Physical Exam  Gen. Pleasant, obese, in no distress, normal affect ENT - no pallor,icterus, no post nasal drip, class 2 airway Neck: No JVD, no thyromegaly, no carotid bruits Lungs: no use of accessory muscles, no dullness to percussion, decreased without rales or rhonchi  Cardiovascular: Rhythm regular, heart sounds  normal, no murmurs or gallops, no peripheral edema Abdomen: soft and non-tender, no hepatosplenomegaly, BS normal. Musculoskeletal: No deformities, no cyanosis or clubbing Neuro:  alert, non focal, no tremors       Assessment & Plan:

## 2020-06-26 NOTE — Patient Instructions (Signed)
  Auto CPAP 5 to 15 cm, nasal mask of choice We discussed other treatment options

## 2020-06-27 NOTE — Assessment & Plan Note (Signed)
Home sleep test shows moderate degree of sleep disordered breathing with significant hypoxia.  Given cardiac history of atrial fibrillation and nonrefreshing sleep and burden of hypoxia, I think this deserves treatment.  We discussed treatment options including dental appliance and CPAP therapy.  I would recommend CPAP trial to see if he feels more refreshed after using this and also if he tolerates this then we can consider this as long-term therapy.  If he does not tolerate then we can trial alternative oral appliance. Inspire device could be considered second line, BMI would be a relative contraindication. He was willing to trial CPAP we will place an order for auto CPAP 5 to 15 cm with nasal mask of choice

## 2020-06-27 NOTE — Assessment & Plan Note (Signed)
Significant hypoxic burden noted on home sleep test with desaturations noted even in the absence of respiratory events.  This may be related to underlying obstructive pulmonary disease. Once he settles down CPAP would repeat nocturnal oximetry on CPAP/room air to reassess

## 2020-06-30 ENCOUNTER — Other Ambulatory Visit: Payer: Medicare Other

## 2020-07-03 ENCOUNTER — Other Ambulatory Visit: Payer: Medicare Other

## 2020-07-03 DIAGNOSIS — Z20822 Contact with and (suspected) exposure to covid-19: Secondary | ICD-10-CM

## 2020-07-04 LAB — NOVEL CORONAVIRUS, NAA: SARS-CoV-2, NAA: NOT DETECTED

## 2020-07-04 LAB — SARS-COV-2, NAA 2 DAY TAT

## 2020-07-14 ENCOUNTER — Ambulatory Visit: Payer: Medicare Other | Admitting: Internal Medicine

## 2020-07-17 ENCOUNTER — Other Ambulatory Visit: Payer: Medicare Other

## 2020-07-17 DIAGNOSIS — Z20822 Contact with and (suspected) exposure to covid-19: Secondary | ICD-10-CM

## 2020-07-19 LAB — SARS-COV-2, NAA 2 DAY TAT

## 2020-07-19 LAB — NOVEL CORONAVIRUS, NAA: SARS-CoV-2, NAA: NOT DETECTED

## 2020-07-23 ENCOUNTER — Telehealth: Payer: Self-pay | Admitting: Pulmonary Disease

## 2020-07-23 MED ORDER — PREDNISONE 20 MG PO TABS: 40.0000 mg | ORAL_TABLET | Freq: Every day | ORAL | 0 refills | Status: DC

## 2020-07-23 MED ORDER — TRELEGY ELLIPTA 100-62.5-25 MCG/INH IN AEPB: 1.0000 | INHALATION_SPRAY | Freq: Every day | RESPIRATORY_TRACT | 3 refills | Status: DC

## 2020-07-23 NOTE — Telephone Encounter (Signed)
Primary Pulmonologist: [name]  Last office visit and with whom: 08/22/19 with Dr. Tonia Brooms   What do we see them for (pulmonary problems): DOE COPD Gold  Last OV assessment/plan: DOE (dyspnea on exertion)  Stage 2 moderate COPD by GOLD classification (HCC)  On continuous oral anticoagulation  Chronic obstructive pulmonary disease, unspecified COPD type (HCC)  History of pulmonary embolism  Discussion:  [name], occasional cigar use, lifelong noncigarette smoker.  Pulmonary function test with moderate obstructive disease ratio of 68, FEV1 56% currently managed with Trelegy inhaler.  Prior left upper lobe pulmonary nodule with groundglass.  Follow-up CT imaging was ordered but not yet completed, however lung nodule was small 5 mm in size.  Prior pulmonary infiltrate resolved.  Acute bilateral submassive PE occurred in August 2020 currently on anticoagulation, hypercoagulability work-up negative.  Patient has persistent dyspnea symptoms.  Despite patient's abnormal PFTs we have had no other clear reason for patient's ongoing dyspnea.  Patient has been referred to [facility] for evaluation by Dr. Katrine Coho.  We discussed some questions and pertinent information today in the office to make sure to ask and discuss next week with his consultation with Dr. Katrine Coho.  One question I have would be the utility of cardiopulmonary exercise testing for further evaluation of his dyspnea.  In the meantime patient should continue his current Trelegy inhaler.  Continue Eliquis.  Patient to follow-up in our clinic in 6 months or as needed based on symptoms.  Greater than 50% of this patient's 35-minute of visit was spent face-to-face discussing above recommendations and treatment plan.    Reason for call:  Pt reports cough/ wheezing/ SOB and chest tightness  x 1 week, last week pt had low grade temp and facial pain, Covid - results last week. Pt reports using Proair 2-3 times a day but it  is not helping. Pt reports not using any OTC medications to help with cough. Pt is asking if he should restart Trelegy inhaler. Pt has had 2 Pfizer Covid vaccines.    Allergies  Allergen Reactions  . Aldactone [Spironolactone]     Breast tissue enlargement  . Codeine Other (See Comments)    Headache   . Keflex [Cephalexin] Diarrhea  . Lisinopril Cough  . Nexium [Esomeprazole Magnesium] Diarrhea  . Sulfa Antibiotics Rash  . Sulfa Drugs Cross Reactors Rash    Immunization History  Administered Date(s) Administered  . Influenza, High Dose Seasonal PF 05/12/2018  . Influenza,inj,Quad PF,6+ Mos 05/02/2019, 05/09/2019  . Influenza-Unspecified 05/02/2019, 05/09/2019  . PFIZER SARS-COV-2 Vaccination 07/18/2019, 08/08/2019   Dr. Tonia Brooms please advise. Thank you

## 2020-07-23 NOTE — Telephone Encounter (Signed)
Spoke with Casey [name] and informed him of Dr. Myrlene Broker recommendations and ordered stated medications. Casey [name] stated understanding. Nothing further needed at this time.

## 2020-07-23 NOTE — Telephone Encounter (Signed)
PCCM:  I agree. Restart trelegy.   Prednisone 40mg  X5 days   , DO [facility] 07/23/2020 2:26 PM

## 2020-07-30 ENCOUNTER — Telehealth: Payer: Self-pay | Admitting: Pulmonary Disease

## 2020-07-30 MED ORDER — BREZTRI AEROSPHERE 160-9-4.8 MCG/ACT IN AERO: 2.0000 | INHALATION_SPRAY | Freq: Two times a day (BID) | RESPIRATORY_TRACT | 0 refills | Status: DC

## 2020-07-30 NOTE — Telephone Encounter (Signed)
Want to give him samples of breztri to try?  Thanks  Josephine Igo, DO [facility] 07/30/2020 12:45 PM

## 2020-07-30 NOTE — Telephone Encounter (Signed)
Spoke with pt, aware of recs.  Samples left up front, med list updated.  Pt wants to know how long he should expect to take this inhaler- states he has discussed with Dr. Tonia Brooms about these inhalers being somewhat short-term.   Dr. Tonia Brooms please advise, thanks.

## 2020-07-30 NOTE — Telephone Encounter (Signed)
I called and spoke with Dr. [name].   He appreciated the call and is going to come pick up the samples.   Josephine Igo, DO [facility] 07/30/2020 4:54 PM

## 2020-07-30 NOTE — Telephone Encounter (Signed)
Wants alternative for Trelegy

## 2020-07-31 ENCOUNTER — Telehealth: Payer: Self-pay | Admitting: Podiatry

## 2020-07-31 NOTE — Telephone Encounter (Signed)
I returned call and left message to call office back

## 2020-07-31 NOTE — Telephone Encounter (Signed)
The patient left a voicemail stating that he has some questions about his feet and would like a call back.

## 2020-07-31 NOTE — Telephone Encounter (Signed)
Please give him a call

## 2020-08-07 ENCOUNTER — Telehealth: Payer: Self-pay | Admitting: *Deleted

## 2020-08-07 NOTE — Telephone Encounter (Signed)
Patient is interested in Orthotics and whether they would be a better choice along with appropriate shoe gear which you had discussed at last office visit. He plans on doing a lot of  hiking during the summer.Please advise.

## 2020-08-15 ENCOUNTER — Ambulatory Visit: Payer: Medicare Other | Admitting: Internal Medicine

## 2020-08-20 ENCOUNTER — Encounter: Payer: Self-pay | Admitting: Internal Medicine

## 2020-08-20 ENCOUNTER — Other Ambulatory Visit: Payer: Self-pay

## 2020-08-20 ENCOUNTER — Ambulatory Visit (INDEPENDENT_AMBULATORY_CARE_PROVIDER_SITE_OTHER): Payer: Medicare Other | Admitting: Internal Medicine

## 2020-08-20 ENCOUNTER — Ambulatory Visit: Payer: PRIVATE HEALTH INSURANCE | Admitting: Orthotics

## 2020-08-20 VITALS — BP 100/62 | HR 85 | Ht 72.5 in | Wt 239.8 lb

## 2020-08-20 DIAGNOSIS — I10 Essential (primary) hypertension: Secondary | ICD-10-CM

## 2020-08-20 DIAGNOSIS — I2602 Saddle embolus of pulmonary artery with acute cor pulmonale: Secondary | ICD-10-CM

## 2020-08-20 DIAGNOSIS — J449 Chronic obstructive pulmonary disease, unspecified: Secondary | ICD-10-CM

## 2020-08-20 DIAGNOSIS — I480 Paroxysmal atrial fibrillation: Secondary | ICD-10-CM | POA: Diagnosis not present

## 2020-08-20 NOTE — Patient Instructions (Signed)
Medication Instructions:  Your physician recommends that you continue on your current medications as directed. Please refer to the Current Medication list given to you today.  Labwork: None ordered.  Testing/Procedures: None ordered.  Follow-Up:  Your physician wants you to follow-up in: one year with Thompson Grayer, MD. 08/24/21 at 9:30 with Dr. Rayann Heman.  Any Other Special Instructions Will Be Listed Below (If Applicable).  If you need a refill on your cardiac medications before your next appointment, please call your pharmacy.

## 2020-08-20 NOTE — Progress Notes (Signed)
PCP: Leeroy Cha, MD   Primary EP: Dr Carlyon Shadow, Dr. is a 65 y.o. male who presents today for routine electrophysiology followup.  Since last being seen in our clinic, the patient reports doing very well.  He has chronic stable dyspnea.  Rare palpitations.  AF is well controlled.  Today, he denies symptoms of chest pain,  lower extremity edema, dizziness, presyncope, or syncope.  The patient is otherwise without complaint today.   Past Medical History:  Diagnosis Date  . 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HCC)    ongoing evaluation for elevated R>L adosterone secretion  . Arthritis   . Atrial enlargement, bilateral   . Atrial fibrillation (HCC)    paroxysmal  . Atrial flutter ([place])   . Complication of anesthesia    could not urinate after UHR, needed cath, mental fog sinc cervical fusion 08-09-16   . Dysrhythmia    a-fib-had ablation x2  . GERD (gastroesophageal reflux disease)   . Hyperaldosteronism ([place])   . Hypertension   . Kidney cysts    "multiple"  . Nephrolithiasis   . Pneumonia 1990's  . Pre-diabetes   . Prostatitis   . Renal insufficiency    "Cr 1.6"   Past Surgical History:  Procedure Laterality Date  . ABLATION    . ATRIAL FIBRILLATION ABLATION N/A 09/28/2012   Procedure: ATRIAL FIBRILLATION ABLATION;  Surgeon: Thompson Grayer, MD;  Location: [facility] CATH LAB;  Service: Cardiovascular;  Laterality: N/A;  . atrial fibrillation ablation with CTI ablation  05/27/11, 09/28/12   ablation for afib and atrial flutter by Dr Rayann Heman  . BACK SURGERY  08/09/2016   C 5 6  C 6 to C 7 cervical fusion  . CARDIOVERSION  07/28/2011   Procedure: CARDIOVERSION;  Surgeon: Lelon Perla, MD;  Location: [place];  Service: Cardiovascular;  Laterality: N/A;  . COLONOSCOPY WITH PROPOFOL N/A 03/03/2015   Procedure: COLONOSCOPY WITH PROPOFOL;  Surgeon: Garlan Fair, MD;  Location: [facility] ENDOSCOPY;  Service: Gastroenterology;  Laterality: N/A;  . double j stent placement  06/2004    left  . ESOPHAGOGASTRODUODENOSCOPY (EGD) WITH PROPOFOL N/A 03/03/2015   Procedure: ESOPHAGOGASTRODUODENOSCOPY (EGD) WITH PROPOFOL;  Surgeon: Garlan Fair, MD;  Location: [facility] ENDOSCOPY;  Service: Gastroenterology;  Laterality: N/A;  . ESOPHAGOGASTRODUODENOSCOPY (EGD) WITH PROPOFOL N/A 10/25/2016   Procedure: ESOPHAGOGASTRODUODENOSCOPY (EGD) WITH PROPOFOL;  Surgeon: Garlan Fair, MD;  Location: [facility] ENDOSCOPY;  Service: Endoscopy;  Laterality: N/A;  . INGUINAL HERNIA REPAIR     "as a child; ? right"  . INGUINAL HERNIA REPAIR Left 06/23/2016   Procedure: OPEN LEFT INGUINAL HERNIA  REPAIR WITH MESH;  Surgeon: Johnathan Hausen, MD;  Location: [place];  Service: General;  Laterality: Left;  . INSERTION OF MESH Left 06/23/2016   Procedure: INSERTION OF MESH;  Surgeon: Johnathan Hausen, MD;  Location: [place];  Service: General;  Laterality: Left;  . LEFT HEART CATH AND CORONARY ANGIOGRAPHY N/A 07/25/2018   Procedure: LEFT HEART CATH AND CORONARY ANGIOGRAPHY;  Surgeon: Belva Crome, MD;  Location: [place] CV LAB;  Service: Cardiovascular;  Laterality: N/A;  . LUNG BIOPSY  1993  . NASAL SEPTOPLASTY W/ TURBINOPLASTY  07/2006  . prostate biopsy  2011  . schwannoma removal  ~01/2010   left thorax  . TEE WITHOUT CARDIOVERSION N/A 09/27/2012   Procedure: TRANSESOPHAGEAL ECHOCARDIOGRAM (TEE);  Surgeon: Larey Dresser, MD;  Location: [place];  Service: Cardiovascular;  Laterality: N/A;  . ULTRASOUND GUIDANCE  FOR VASCULAR ACCESS  07/25/2018   Procedure: Ultrasound Guidance For Vascular Access;  Surgeon: Belva Crome, MD;  Location: [place] CV LAB;  Service: Cardiovascular;;  . UMBILICAL HERNIA REPAIR  07/2006    ROS- all systems are reviewed and negatives except as per HPI above  Current Outpatient Medications  Medication Sig Dispense Refill  . allopurinol (ZYLOPRIM) 100 MG tablet Take 100 mg by mouth 2 (two) times daily.    Marland Kitchen apixaban (ELIQUIS) 5 MG TABS  tablet Take 1 tablet (5 mg total) by mouth 2 (two) times daily. 180 tablet 3  . Ascorbic Acid (VITAMIN C) 1000 MG tablet Take 1,000 mg by mouth daily.    Marland Kitchen atorvastatin (LIPITOR) 10 MG tablet Take 10 mg by mouth every evening.     . Budeson-Glycopyrrol-Formoterol (BREZTRI AEROSPHERE) 160-9-4.8 MCG/ACT AERO Inhale 2 puffs into the lungs in the morning and at bedtime. 2 g 0  . CIALIS 20 MG tablet Take 20 mg by mouth daily as needed for erectile dysfunction.   11  . diltiazem (CARDIZEM CD) 360 MG 24 hr capsule Take 360 mg by mouth every evening.  7  . eplerenone (INSPRA) 50 MG tablet Take 50 mg by mouth 2 (two) times daily.    . Glucosamine HCl (GLUCOSAMINE PO) Take by mouth.    . hydrochlorothiazide (MICROZIDE) 12.5 MG capsule Take 12.5 mg by mouth daily.    Marland Kitchen losartan (COZAAR) 100 MG tablet Take 100 mg by mouth daily.    Marland Kitchen MAGNESIUM PO Take 1 capsule by mouth daily.    . montelukast (SINGULAIR) 10 MG tablet TAKE 1 TABLET(10 MG) BY MOUTH AT BEDTIME 90 tablet 3  . Multiple Vitamins-Minerals (ZINC PO) Take 1 tablet by mouth 2 (two) times daily.    Marland Kitchen omeprazole (PRILOSEC) 40 MG capsule Take 40 mg by mouth daily before supper.     . Potassium Gluconate 2.5 MEQ TABS Take 3 tablets by mouth 2 (two) times daily.    Marland Kitchen PROAIR HFA 108 (90 Base) MCG/ACT inhaler SMARTSIG:2 Inhalation Via Inhaler Every 6 Hours PRN    . sertraline (ZOLOFT) 100 MG tablet Take 100 mg by mouth daily.    . vitamin B-12 (CYANOCOBALAMIN) 1000 MCG tablet Take 1,000 mcg by mouth daily.     No current facility-administered medications for this visit.    Physical Exam: Vitals:   08/20/20 1047  BP: 100/62  Pulse: 85  SpO2: 93%  Weight: 239 lb 12.8 oz (108.8 kg)  Height: 6' 0.5" (1.842 m)    GEN- The patient is well appearing, alert and oriented x 3 today.   Head- normocephalic, atraumatic Eyes-  Sclera clear, conjunctiva pink Ears- hearing intact Oropharynx- clear Lungs- Clear to ausculation bilaterally, normal work of  breathing Heart- Regular rate and rhythm, no murmurs, rubs or gallops, PMI not laterally displaced GI- soft, NT, ND, + BS Extremities- no clubbing, cyanosis, or edema  Wt Readings from Last 3 Encounters:  08/20/20 239 lb 12.8 oz (108.8 kg)  06/26/20 245 lb 9.6 oz (111.4 kg)  03/26/20 244 lb 6.4 oz (110.9 kg)    EKG tracing ordered today is personally reviewed and shows sinus  Assessment and Plan:  1. Paroxysmal atrial fibrillation Well controlled off AAD therapy chads2vasc score is 2.  He is on [facility] therapy  2. PTE Followed by Dr Corrin Parker at [facility] I would advise long term [place] therapy  3. HTN Stable No change required today   Risks, benefits and potential toxicities for medications prescribed  and/or refilled reviewed with patient today.   Return in a year  Thompson Grayer MD, [facility] 08/20/2020 11:03 AM

## 2020-08-20 NOTE — Progress Notes (Signed)
Patient will advised if he wants to proceed with F/O order AFTER he visits Melissa at [employer]   He is scanned and order is in [employer].

## 2020-09-02 ENCOUNTER — Other Ambulatory Visit: Payer: Medicare Other

## 2020-09-02 ENCOUNTER — Telehealth: Payer: Self-pay | Admitting: Pulmonary Disease

## 2020-09-02 ENCOUNTER — Other Ambulatory Visit: Payer: Self-pay

## 2020-09-02 DIAGNOSIS — Z20822 Contact with and (suspected) exposure to covid-19: Secondary | ICD-10-CM

## 2020-09-03 LAB — NOVEL CORONAVIRUS, NAA: SARS-CoV-2, NAA: NOT DETECTED

## 2020-09-03 LAB — SARS-COV-2, NAA 2 DAY TAT

## 2020-09-03 NOTE — Telephone Encounter (Signed)
I have called and spoke with pt and he is aware of recs per BI.  Nothing further is needed.

## 2020-09-03 NOTE — Telephone Encounter (Signed)
No there is unlikely to be any issues  BLI

## 2020-09-05 ENCOUNTER — Ambulatory Visit: Payer: Medicare Other

## 2020-09-05 ENCOUNTER — Telehealth: Payer: Self-pay | Admitting: Internal Medicine

## 2020-09-05 ENCOUNTER — Other Ambulatory Visit (INDEPENDENT_AMBULATORY_CARE_PROVIDER_SITE_OTHER): Payer: Medicare Other

## 2020-09-05 ENCOUNTER — Telehealth: Payer: Self-pay | Admitting: Pulmonary Disease

## 2020-09-05 ENCOUNTER — Other Ambulatory Visit: Payer: Self-pay

## 2020-09-05 ENCOUNTER — Ambulatory Visit (INDEPENDENT_AMBULATORY_CARE_PROVIDER_SITE_OTHER)
Admission: RE | Admit: 2020-09-05 | Discharge: 2020-09-05 | Disposition: A | Discharge status: Home / Self Care | Payer: Medicare Other | Source: Ambulatory Visit | Attending: Internal Medicine | Admitting: Internal Medicine

## 2020-09-05 DIAGNOSIS — J449 Chronic obstructive pulmonary disease, unspecified: Secondary | ICD-10-CM

## 2020-09-05 DIAGNOSIS — N179 Acute kidney failure, unspecified: Secondary | ICD-10-CM

## 2020-09-05 DIAGNOSIS — Z86711 Personal history of pulmonary embolism: Secondary | ICD-10-CM

## 2020-09-05 DIAGNOSIS — R0609 Other forms of dyspnea: Secondary | ICD-10-CM

## 2020-09-05 DIAGNOSIS — R0600 Dyspnea, unspecified: Secondary | ICD-10-CM

## 2020-09-05 LAB — HEPATIC FUNCTION PANEL
ALT: 21 U/L (ref 0–53)
AST: 22 U/L (ref 0–37)
Albumin: 3.8 g/dL (ref 3.5–5.2)
Alkaline Phosphatase: 60 U/L (ref 39–117)
Bilirubin, Direct: 0.2 mg/dL (ref 0.0–0.3)
Total Bilirubin: 0.7 mg/dL (ref 0.2–1.2)
Total Protein: 7 g/dL (ref 6.0–8.3)

## 2020-09-05 LAB — MAGNESIUM: Magnesium: 1.7 mg/dL (ref 1.5–2.5)

## 2020-09-05 LAB — BASIC METABOLIC PANEL
BUN: 35 mg/dL — ABNORMAL HIGH (ref 6–23)
CO2: 28 mEq/L (ref 19–32)
Calcium: 9.1 mg/dL (ref 8.4–10.5)
Chloride: 99 mEq/L (ref 96–112)
Creatinine, Ser: 2.07 mg/dL — ABNORMAL HIGH (ref 0.40–1.50)
GFR: 32.81 mL/min — ABNORMAL LOW (ref >60.00)
Glucose, Bld: 139 mg/dL — ABNORMAL HIGH (ref 70–99)
Potassium: 3.9 mEq/L (ref 3.5–5.1)
Sodium: 137 mEq/L (ref 135–145)

## 2020-09-05 LAB — CBC WITH DIFFERENTIAL/PLATELET
Basophils Absolute: 0 10*3/uL (ref 0.0–0.1)
Basophils Relative: 0.5 % (ref 0.0–3.0)
Eosinophils Absolute: 0.1 10*3/uL (ref 0.0–0.7)
Eosinophils Relative: 1.1 % (ref 0.0–5.0)
HCT: 39.1 % (ref 39.0–52.0)
Hemoglobin: 13.2 g/dL (ref 13.0–17.0)
Lymphocytes Relative: 16 % (ref 12.0–46.0)
Lymphs Abs: 0.9 10*3/uL (ref 0.7–4.0)
MCHC: 33.8 g/dL (ref 30.0–36.0)
MCV: 86.4 fl (ref 78.0–100.0)
Monocytes Absolute: 0.4 10*3/uL (ref 0.1–1.0)
Monocytes Relative: 7.5 % (ref 3.0–12.0)
Neutro Abs: 4 10*3/uL (ref 1.4–7.7)
Neutrophils Relative %: 74.9 % (ref 43.0–77.0)
Platelets: 182 10*3/uL (ref 150.0–400.0)
RBC: 4.52 Mil/uL (ref 4.22–5.81)
RDW: 15.9 % — ABNORMAL HIGH (ref 11.5–15.5)
WBC: 5.3 10*3/uL (ref 4.0–10.5)

## 2020-09-05 LAB — PHOSPHORUS: Phosphorus: 3.1 mg/dL (ref 2.3–4.6)

## 2020-09-05 LAB — TROPONIN I (HIGH SENSITIVITY): High Sens Troponin I: 9 ng/L (ref 2–17)

## 2020-09-05 NOTE — Telephone Encounter (Signed)
Call made to patient, confirmed DOB. Made aware of results per MR. Aware still awaiting BMP and D-Dimer. Aware to call his cardiologist and increase his fluids.   Requests labs be sent to cardiologist with [facility] once they have all resulted.   Will route message to MR nurse to make aware to send labs upon return to office on Monday.   Cardiologist Leeroy Cha [phone].

## 2020-09-05 NOTE — Telephone Encounter (Signed)
Called and spoke with patient to let him know recs of Dr. Chase Caller. Advised patient to go to [place] office to obtain labs and CXR. Provided him with the address. He said he would go get them done today. Nothing further needed at this time.

## 2020-09-05 NOTE — Telephone Encounter (Signed)
Call returned to patient, confirmed DOB. Reports having increased SOB that started Monday. He reports increased fatigue. 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 He reports in January of 2020 he was having some of the same symptoms and he ended up in the hospital with pneumonia and a blood clot.  Denies active chest pain. Reports intermittent fevers this week ranging from 100.1 and 100.6. He was tested for covid, flu, and rsv, awaiting results on yesterday. Requesting recommendations.   Most recent Covid test was 09/02/20, which was negative.   TP please advise as BI is not in clinic today.   Thanks :)

## 2020-09-05 NOTE — Telephone Encounter (Signed)
Please let him know that his  - troponin is normal.  -CBC is normal -His chest x-ray is just chronic stable changes that shows a left hemidiaphragm elevation  -The main thing is that his creatinine and BUN is significantly worse all of a sudden.  His baseline BUN is 30 as of a year ago but now it is 35.  His baseline creatinine is around 1.5 but it is now over 2 mg percent.  Potential reasons for this include his diuretics and losartan  -I think the worsening creatinine can explain his symptoms particularly fatigue and tiredness  Plan -Pending labs BNP and D-dimer- > doubt getting these results tonight this can change his management.  I will recheck them in the morning when I get to work  He needs to call his cardiologist for primary care physician who is in charge of all his diuretics to see if any changes needed.  He might have to consider drinking increasing his fluid intake       LABS   Results for Casey [name], Casey [name], DR. (MRN [id number]) as of 09/05/2020 16:55  Ref. Range 04/30/2019 11:59 06/04/2019 16:57 08/20/2019 12:15 08/23/2019 09:29 09/05/2020 14:52  BUN Latest Ref Range: 6 - 23 mg/dL 31 (H)  30 (H) 29 (H) 35 (H)  Creatinine Latest Ref Range: 0.40 - 1.50 mg/dL 1.58 (H)  1.70 (H) 1.50 (H) 2.07 (H)    Results for Casey [name], Casey [name], DR. (MRN [id number]) as of 09/05/2020 16:55  Ref. Range 09/05/2020 14:56  High Sens Troponin I Latest Ref Range: 2 - 17 ng/L 9   PULMONARY No results for input(s): PHART, PCO2ART, PO2ART, HCO3, TCO2, O2SAT in the last 168 hours.  Invalid input(s): PCO2, PO2  CBC Recent Labs  Lab 09/05/20 1452  HGB 13.2  HCT 39.1  WBC 5.3  PLT 182.0    COAGULATION No results for input(s): INR in the last 168 hours.  CARDIAC  No results for input(s): TROPONINI in the last 168 hours. No results for input(s): PROBNP in the last 168 hours.   CHEMISTRY Recent Labs  Lab 09/05/20 1452  NA 137  K 3.9  CL 99  CO2 28  GLUCOSE 139*  BUN 35*  CREATININE  2.07*  CALCIUM 9.1  MG 1.7  PHOS 3.1   Estimated Creatinine Clearance: 45.1 mL/min (A) (by C-G formula based on SCr of 2.07 mg/dL (H)).   LIVER Recent Labs  Lab 09/05/20 1452  AST 22  ALT 21  ALKPHOS 60  BILITOT 0.7  PROT 7.0  ALBUMIN 3.8     INFECTIOUS No results for input(s): LATICACIDVEN, PROCALCITON in the last 168 hours.   ENDOCRINE CBG (last 3)  No results for input(s): GLUCAP in the last 72 hours.       IMAGING x48h  - image(s) personally visualized  -   highlighted in bold DG Chest 2 View  Result Date: 09/05/2020 CLINICAL DATA:  Shortness of breath EXAM: CHEST - 2 VIEW COMPARISON:  March 14, 2019 chest radiograph; chest CT October 30, 2019 FINDINGS: There is stable mild elevation of the left hemidiaphragm with apparent scarring in the left base. No edema or some consolidation. Heart size and pulmonary vascular normal. No adenopathy. There is aortic atherosclerosis. There is postoperative change in the lower cervical region. IMPRESSION: There is a degree of elevation of the left hemidiaphragm, stable. Slight scarring left base. No edema or airspace opacity. Stable cardiac silhouette. Aortic Atherosclerosis (ICD10-I70.0). Electronically Signed   By: Lowella Grip III M.D.  On: 09/05/2020 15:17      Current Outpatient Medications:  .  allopurinol (ZYLOPRIM) 100 MG tablet, Take 100 mg by mouth 2 (two) times daily., Disp: , Rfl:  .  apixaban (ELIQUIS) 5 MG TABS tablet, Take 1 tablet (5 mg total) by mouth 2 (two) times daily., Disp: 180 tablet, Rfl: 3 .  Ascorbic Acid (VITAMIN C) 1000 MG tablet, Take 1,000 mg by mouth daily., Disp: , Rfl:  .  atorvastatin (LIPITOR) 10 MG tablet, Take 10 mg by mouth every evening. , Disp: , Rfl:  .  Budeson-Glycopyrrol-Formoterol (BREZTRI AEROSPHERE) 160-9-4.8 MCG/ACT AERO, Inhale 2 puffs into the lungs in the morning and at bedtime., Disp: 2 g, Rfl: 0 .  CIALIS 20 MG tablet, Take 20 mg by mouth daily as needed for erectile  dysfunction. , Disp: , Rfl: 11 .  diltiazem (CARDIZEM CD) 360 MG 24 hr capsule, Take 360 mg by mouth every evening., Disp: , Rfl: 7 .  eplerenone (INSPRA) 50 MG tablet, Take 50 mg by mouth 2 (two) times daily., Disp: , Rfl:  .  Glucosamine HCl (GLUCOSAMINE PO), Take by mouth., Disp: , Rfl:  .  hydrochlorothiazide (MICROZIDE) 12.5 MG capsule, Take 12.5 mg by mouth daily., Disp: , Rfl:  .  losartan (COZAAR) 100 MG tablet, Take 100 mg by mouth daily., Disp: , Rfl:  .  MAGNESIUM PO, Take 1 capsule by mouth daily., Disp: , Rfl:  .  montelukast (SINGULAIR) 10 MG tablet, TAKE 1 TABLET(10 MG) BY MOUTH AT BEDTIME, Disp: 90 tablet, Rfl: 3 .  Multiple Vitamins-Minerals (ZINC PO), Take 1 tablet by mouth 2 (two) times daily., Disp: , Rfl:  .  omeprazole (PRILOSEC) 40 MG capsule, Take 40 mg by mouth daily before supper. , Disp: , Rfl:  .  Potassium Gluconate 2.5 MEQ TABS, Take 3 tablets by mouth 2 (two) times daily., Disp: , Rfl:  .  PROAIR HFA 108 (90 Base) MCG/ACT inhaler, SMARTSIG:2 Inhalation Via Inhaler Every 6 Hours PRN, Disp: , Rfl:  .  sertraline (ZOLOFT) 100 MG tablet, Take 100 mg by mouth daily., Disp: , Rfl:  .  vitamin B-12 (CYANOCOBALAMIN) 1000 MCG tablet, Take 1,000 mcg by mouth daily., Disp: , Rfl:

## 2020-09-05 NOTE — Telephone Encounter (Addendum)
Monday 2/7 -> felt exhaused tand then ok but really had low grade fevers on and off all week . Covid neg 09/02/20 PCR. On . 09/05/2020 Friday - worked at [employer] and felt exhausted. Also, increased dyspnea on exertion. HAd to sit down to talk to patients   He walked himself outside house - did not desat  Plan  - cxr 2 view 09/05/2020 = do urgent labs - cbc, bmet, mag, phos, bnp, troponin, d-dimer, mag, phos, LFT   - ok to go to lab because his covid pcr while symptomatic was negative  I will review results later tonight or tomorro w and advise him   xxxxxxxxxxxxxxxxxxxxxxxxxxxxxxxxxxxxx  Says he is on BREZTRI, eliquis (psat A Fib, PE 2020) Awaiting cPAP for OSA  covid PCR neg 09/02/20    xxxxxxxxxxxxxxxxxxxx  Results for Casey [name], DR. (MRN [id number]) as of 09/05/2020 12:57  Ref. Range 04/30/2019 11:59  Hemoglobin Latest Ref Range: 13.0 - 17.0 g/dL 14.4   Results for CEZAR, MISIASZEK, DR. (MRN [id number]) as of 09/05/2020 12:57  Ref. Range 08/20/2019 12:15 08/23/2019 09:29  Creatinine Latest Ref Range: 0.61 - 1.24 mg/dL 1.70 (H) 1.50 (H)  Results for IFEANYICHUKWU, WICKHAM, DR. (MRN [id number]) as of 09/05/2020 12:57  Ref. Range 06/04/2019 16:57  Sed Rate Latest Ref Range: 0 - 30 mm/hr 19     has a past medical history of 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HCC), Arthritis, Atrial enlargement, bilateral, Atrial fibrillation (HCC), Atrial flutter ([place]), Complication of anesthesia, Dysrhythmia, GERD (gastroesophageal reflux disease), Hyperaldosteronism ([place]), Hypertension, Kidney cysts, Nephrolithiasis, Pneumonia (1990's), Pre-diabetes, Prostatitis, and Renal insufficiency.    has a past surgical history that includes Ablation; schwannoma removal (~01/2010); Cardioversion (07/28/2011); Inguinal hernia repair; Lung biopsy (1993); double j stent placement (06/2004); Umbilical hernia repair (07/2006); Nasal septoplasty w/ turbinoplasty (07/2006); prostate biopsy (2011); atrial fibrillation  ablation with CTI ablation (05/27/11, 09/28/12); TEE without cardioversion (N/A, 09/27/2012); atrial fibrillation ablation (N/A, 09/28/2012); Colonoscopy with propofol (N/A, 03/03/2015); Esophagogastroduodenoscopy (egd) with propofol (N/A, 03/03/2015); Inguinal hernia repair (Left, 06/23/2016); Insertion of mesh (Left, 06/23/2016); Back surgery (08/09/2016); Esophagogastroduodenoscopy (egd) with propofol (N/A, 10/25/2016); LEFT HEART CATH AND CORONARY ANGIOGRAPHY (N/A, 07/25/2018); and Ultrasound guidance for vascular access (07/25/2018).  Allergies  Allergen Reactions  . Aldactone [Spironolactone]     Breast tissue enlargement  . Codeine Other (See Comments)    Headache   . Keflex [Cephalexin] Diarrhea  . Lisinopril Cough  . Nexium [Esomeprazole Magnesium] Diarrhea  . Sulfa Antibiotics Rash  . Sulfa Drugs Cross Reactors Rash

## 2020-09-06 LAB — D-DIMER, QUANTITATIVE: D-Dimer, Quant: 0.44 mcg/mL FEU (ref <0.50)

## 2020-09-06 LAB — PRO B NATRIURETIC PEPTIDE: NT-Pro BNP: 315 pg/mL (ref 0–376)

## 2020-09-08 MED ORDER — BREZTRI AEROSPHERE 160-9-4.8 MCG/ACT IN AERO: 2.0000 | INHALATION_SPRAY | Freq: Two times a day (BID) | RESPIRATORY_TRACT | 11 refills | Status: DC

## 2020-09-08 NOTE — Telephone Encounter (Signed)
Pt returning phone call. Pt would like to speak with either BI or MR. Pt can be reached at [phone]

## 2020-09-08 NOTE — Telephone Encounter (Signed)
PCCM:  I called and spoke with the patient.   Repeat BMP ordered for the end of the week.  Patient need refills of inhalers.  Meds ordered this encounter  Medications  . Budeson-Glycopyrrol-Formoterol (BREZTRI AEROSPHERE) 160-9-4.8 MCG/ACT AERO    Sig: Inhale 2 puffs into the lungs in the morning and at bedtime.    Dispense:  10.7 g    Refill:  Holmes, DO  Pulmonary Critical Care 09/08/2020 5:47 PM

## 2020-09-09 NOTE — Telephone Encounter (Addendum)
Casey [name]  He had texted me yesterday and I spoke to him around 12.20pm. told him I would close loop with you and the workup so far. He was stilll feeling fatigued and sob. I had suggested repeat BMET later this week and consideration for repeat echo (last in 2020). Will leave it to you to decide but had mentioned to him I would  Circle back with you.  Thanks  Reply not needed. Will close message  MR

## 2020-09-29 ENCOUNTER — Telehealth: Payer: Self-pay | Admitting: Pulmonary Disease

## 2020-09-29 NOTE — Telephone Encounter (Signed)
Called and spoke with pt making him aware that it is taking pts longer to receive cpap machine due to recall on machines and machines being on backorder. Provided pt with phone number for [employer] for him to call to get update. Nothing further needed.

## 2020-10-23 DIAGNOSIS — J382 Nodules of vocal cords: Secondary | ICD-10-CM | POA: Insufficient documentation

## 2020-10-23 DIAGNOSIS — H6123 Impacted cerumen, bilateral: Secondary | ICD-10-CM | POA: Insufficient documentation

## 2020-10-23 DIAGNOSIS — K219 Gastro-esophageal reflux disease without esophagitis: Secondary | ICD-10-CM | POA: Insufficient documentation

## 2020-11-12 ENCOUNTER — Ambulatory Visit: Payer: Medicare Other | Attending: Internal Medicine

## 2020-11-12 ENCOUNTER — Other Ambulatory Visit: Payer: Self-pay

## 2020-11-12 DIAGNOSIS — J382 Nodules of vocal cords: Secondary | ICD-10-CM | POA: Diagnosis present

## 2020-11-12 DIAGNOSIS — R490 Dysphonia: Secondary | ICD-10-CM | POA: Diagnosis not present

## 2020-11-13 NOTE — Patient Instructions (Signed)
VOICE CONSERVATION PROGRAM  1. Avoid overuse of voice or excessive use of the voice . The vocal cords can become easily fatigued. Try to sort out what is "necessary" versus "unnecessary" talking in your environment. You do not need to STOP talking, but try to limit it as much as possible.  . Think of resting your voice just as long as you talk. For example, if you talk for 5 minutes, rest your voice completely for 5 minutes. . If your voice feels "tired" during the day, try to rest it as much as possible.  2. Avoid using an excessively loud voice or shouting/raising your voice . When you yell or raise your voice, the vocal cords slam into each other, much like a strong hand clap. This causes irritation, and if this irritation continues, hoarseness may increase. . If people in your home talk loudly, ask them to reduce the volume of their voices to help you decrease your volume as well. . Sit near or face the person to whom you are speaking.   3. Avoid talking over background noise . When talking in background noise, speech automatically has increased loudness, and as in number 2 above, continued loud speech can result in increased hoarseness due to irritation to the vocal cords.  . Do not talk over the radio or the TV. Mute them before speaking, go to a quieter place to talk, or sit next to the person with whom you are watching.  4. Talk in a voice that is soft, smooth, and gentle . This allows the vocal cords to come together in a gentle way and allows the air being exhaled from the lungs to do most/all of the work when you are speaking.  5. Avoid excessive throat clearing, coughing, and loud laughter . During these activities the vocal cords can also be slammed together in a hard way and foster irritation or swelling, which can cause hoarseness. . Try taking sips of room temperature/cool water with hard swallows to clear secretions from the throat, instead of throat clearing or  coughing. . If you absolutely must clear your throat, do so as gently as possible. If you find yourself clearing your throat or coughing a lot, consult your physician. . You will want to laugh. Continue to do so, but softly and gently. Do not laugh loudly for long periods of time because this can increase the chances for vocal fold irritation and thus hoarseness. . Lozenges can help reduce the need to clear throat/cough during cold/allergy season.  6. Keep the mouth and throat lubricated . Drink at least 8-10 8 oz. glasses of water per day (64-80 oz.). This water can come in the form of drink mixes.  . Caffeinated beverages such as colas, coffee, and tea (hot tea AND sweet tea) dry out the vocal cords and then can cause irritation and thus hoarseness.  . Drinking water will keep your body hydrated. This will help to decrease secretions in the throat, which cause people to clear their throats or cough. . If you are exercising outside (especially during drier weather), try to breathe through your nose as much as possible. If this is not possible, lifting the tongue up behind the upper teeth when breathing through the mouth will add some moisture to the air breathed in past the vocal cords.  7. Avoid mouth breathing - breathe through your nose . Your nose serves as a natural filter for dust and dirt particles from the air, and as a humidifier to   moisten the air. Vocal cords like to work in moistened, filtered air. When you breathe through your mouth you lose the air filtering and moistening benefits of breathing through the nose. Marland Kitchen Be aware of breathing patterns when sitting quietly (e.g., reading or watching TV). Increase your awareness and try to change habits from mouth breathing to nose breathing during those times.  8. Avoid environmental and/or ingested irritants . Try to avoid smoke-filled and or dusty environments. These items dry out the vocal cords and cause irritation.  9. Use an air filter  if the home is dusty, and/or a humidifier if the air is dry  . This will help to maintain clean, humid air to breathe. Remember, this type of air is what the vocal cords like best.  ==================================================================  If you go to your follow up ENT visit and nodules are worse and you both think some muscle tension is present, please return here for ST for reducing tension dysphonia. If nodules are worse and no tension, might be a good idea to get referral to [facility].

## 2020-11-13 NOTE — Therapy (Signed)
[facility] [address] [place], [place], [address] Phone: [phone]   Fax:  [phone]  Speech Language Pathology Evaluation  Patient Details  Name: Casey [name], Dr. MRN: [id number] Date of Birth: 12-18-1955 Referring Provider (SLP): Izora Gala, MD   Encounter Date: 11/12/2020   End of Session - 11/13/20 0009    Visit Number 1    Number of Visits 13    Date for SLP Re-Evaluation 02/11/21    SLP Start Time 49    SLP Stop Time  1103    SLP Time Calculation (min) 43 min    Activity Tolerance Patient tolerated treatment well           Past Medical History:  Diagnosis Date  . 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HCC)    ongoing evaluation for elevated R>L adosterone secretion  . Arthritis   . Atrial enlargement, bilateral   . Atrial fibrillation (HCC)    paroxysmal  . Atrial flutter ([place])   . Complication of anesthesia    could not urinate after UHR, needed cath, mental fog sinc cervical fusion 08-09-16   . Dysrhythmia    a-fib-had ablation x2  . GERD (gastroesophageal reflux disease)   . Hyperaldosteronism ([place])   . Hypertension   . Kidney cysts    "multiple"  . Nephrolithiasis   . Pneumonia 1990's  . Pre-diabetes   . Prostatitis   . Renal insufficiency    "Cr 1.6"    Past Surgical History:  Procedure Laterality Date  . ABLATION    . ATRIAL FIBRILLATION ABLATION N/A 09/28/2012   Procedure: ATRIAL FIBRILLATION ABLATION;  Surgeon: Thompson Grayer, MD;  Location: [facility] CATH LAB;  Service: Cardiovascular;  Laterality: N/A;  . atrial fibrillation ablation with CTI ablation  05/27/11, 09/28/12   ablation for afib and atrial flutter by Dr Rayann Heman  . BACK SURGERY  08/09/2016   C 5 6  C 6 to C 7 cervical fusion  . CARDIOVERSION  07/28/2011   Procedure: CARDIOVERSION;  Surgeon: Lelon Perla, MD;  Location: [facility];  Service: Cardiovascular;  Laterality: N/A;  . COLONOSCOPY WITH PROPOFOL N/A 03/03/2015    Procedure: COLONOSCOPY WITH PROPOFOL;  Surgeon: Garlan Fair, MD;  Location: [facility];  Service: Gastroenterology;  Laterality: N/A;  . double j stent placement  06/2004   left  . ESOPHAGOGASTRODUODENOSCOPY (EGD) WITH PROPOFOL N/A 03/03/2015   Procedure: ESOPHAGOGASTRODUODENOSCOPY (EGD) WITH PROPOFOL;  Surgeon: Garlan Fair, MD;  Location: [facility];  Service: Gastroenterology;  Laterality: N/A;  . ESOPHAGOGASTRODUODENOSCOPY (EGD) WITH PROPOFOL N/A 10/25/2016   Procedure: ESOPHAGOGASTRODUODENOSCOPY (EGD) WITH PROPOFOL;  Surgeon: Garlan Fair, MD;  Location: [facility];  Service: Endoscopy;  Laterality: N/A;  . INGUINAL HERNIA REPAIR     "as a child; ? right"  . INGUINAL HERNIA REPAIR Left 06/23/2016   Procedure: OPEN LEFT INGUINAL HERNIA  REPAIR WITH MESH;  Surgeon: Johnathan Hausen, MD;  Location: [facility];  Service: General;  Laterality: Left;  . INSERTION OF MESH Left 06/23/2016   Procedure: INSERTION OF MESH;  Surgeon: Johnathan Hausen, MD;  Location: [facility];  Service: General;  Laterality: Left;  . LEFT HEART CATH AND CORONARY ANGIOGRAPHY N/A 07/25/2018   Procedure: LEFT HEART CATH AND CORONARY ANGIOGRAPHY;  Surgeon: Belva Crome, MD;  Location: [facility] CV LAB;  Service: Cardiovascular;  Laterality: N/A;  . LUNG BIOPSY  1993  . NASAL SEPTOPLASTY W/ TURBINOPLASTY  07/2006  . prostate biopsy  2011  .  schwannoma removal  ~01/2010   left thorax  . TEE WITHOUT CARDIOVERSION N/A 09/27/2012   Procedure: TRANSESOPHAGEAL ECHOCARDIOGRAM (TEE);  Surgeon: Larey Dresser, MD;  Location: [facility];  Service: Cardiovascular;  Laterality: N/A;  . ULTRASOUND GUIDANCE FOR VASCULAR ACCESS  07/25/2018   Procedure: Ultrasound Guidance For Vascular Access;  Surgeon: Belva Crome, MD;  Location: [place] [facility] CV LAB;  Service: Cardiovascular;;  . UMBILICAL HERNIA REPAIR  07/2006    There were no vitals filed for this visit.   Subjective Assessment -  11/12/20 2353    Subjective Pt reports hoarseness since approx Christmas 2021, slightly worse now than at ENT evaluation last month.              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 Idiopathic vocal nodules - pt with URI approx 4-6 weeks after onset treated with antibiotics and subsided. Pt with paralyzed rt diaphragm for approx 7 years.      Balance Screen   Has the patient fallen in the past 6 months No      Prior Functional Status   Cognitive/Linguistic Baseline Within functional limits    Type of Home House     Lives With Spouse    Vocation Retired      Associate Professor   Overall Cognitive Status Within Functional Limits for tasks assessed      Auditory Comprehension   Overall Auditory Comprehension Appears within functional limits for tasks assessed      Motor Speech   Respiration --   pt is chest breather   Phonation Hoarse   usually mild to rarely moderate; WNL volume; Pt reports voice was almost diplophonic yesterday; sustained /a/ below WNL average 10.3 seconds; s/z ratio is 1.18 (WNL).   Phonation Impaired    Vocal Abuses Vocal Fold Dehydration;Habitual Cough/Throat Clear;Other (comment)   (both questionable/possible). These will be targeted with voice hygiene program   Volume Decibel Level   low 70s dB average - rare incr into mid 70s dB         Voice related QOL raw score : 15                 SLP Education - 11/13/20 0007    Education Details vocal conservation, pt is chest breather, sustained /a/ is below WNL    Person(s) Educated Patient    Methods Explanation;Handout;Demonstration     Comprehension Verbalized understanding            SLP Short Term Goals - 11/13/20 0022      SLP SHORT TERM GOAL #1   Title pt will report following 2 vocal hygiene aspects between 3 sessions    Time 4    Period Weeks    Status New      SLP SHORT TERM GOAL #2   Title pt will demo abdominal breathing in 15/20 responses at sentence level    Time 4    Period Weeks    Status New            SLP Long Term  Goals - 11/13/20 0017      SLP LONG TERM GOAL #1   Title pt will reduce his voice-related QOL score to raw score of 12 (from 15 raw score on eval day)    Time 6    Period Weeks   or 13 total visits, for all LTGs   Status New      SLP LONG TERM GOAL #2   Title pt will report using 2 aspects of vocal hygiene program between 5 sessions    Time 6    Period Weeks    Status New      SLP LONG TERM GOAL #3   Title pt will demo abdominal breathing in 10 minutes mod complex conversation x2 sessions    Time 6    Period Weeks      SLP LONG TERM GOAL #4   Title pt will perform flow phonation related tasks with rare min A for proper airflow for WNl voicing    Time 6    Period Weeks    Status New            Plan - 11/13/20 0011    Clinical Impression Statement Dr. Jodi Marble Ileene Hutchinson") presents today with mild to rarely moderate hoarseness of approx 4 months. ENT Dr. Constance Holster visualized a small vocal nodule of idiopathic etiology on pt's right fold - left fold could not be visualized due to a strange viewing angle, per pt but nodules are presumed bilateral. Pt with some possible vocally non-hygenic behaviors he will target in the next 4-6 weeks. SLP also noted pt is a chest  breather in conversation and therapy for abdominal breathing may well be helpful for pt. For now pt to target vocal hygeiene measures on his own. However, if in his next ENT appointment, assessment will be made if there is any dysphonia due to muscle tension - if so pt should return to this clinic for direct  skilled ST for muscle tension dysphonia (voice retraining using flow phonation technique).    Speech Therapy Frequency 2x / week    Duration --   6 weeks or 13 total sessions   Treatment/Interventions Other (comment);Patient/family education;Internal/external aids;Compensatory strategies;SLP instruction and feedback;Functional tasks    Potential to Achieve Goals Good    SLP Home Exercise Plan provided today    Consulted and Agree with Plan of Care Patient           Patient will benefit from skilled therapeutic intervention in order to improve the following deficits and impairments:   Hoarseness  Voice hoarseness  Vocal nodules in adults    Problem List Patient Active Problem List   Diagnosis Date Noted  . Moderate COPD (chronic obstructive pulmonary disease) ([place]) 03/26/2020  . OSA (obstructive sleep apnea) 03/26/2020  . Pulmonary emboli ([place]) 03/14/2019  . Chronic respiratory failure with hypoxia ([place]) 08/18/2018  . Hyperlipidemia 08/17/2018  . Community acquired pneumonia of right lower lobe of lung 08/16/2018  . CKD (chronic kidney disease), stage II 08/16/2018  . Abnormal ECG 07/25/2018  . Incontinence of feces 04/18/2018  . Urge incontinence of urine 03/11/2018  . Renal insufficiency   . Prostatitis   . Pre-diabetes   . Pneumonia   . Nephrolithiasis   . Kidney cysts   . Hyperaldosteronism ([place])   . GERD (gastroesophageal reflux disease)   . Dysrhythmia   . Complication of anesthesia   . Atrial enlargement, bilateral   . Arthritis   . 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[place])   .  Inguinal hernia of left side without obstruction or gangrene Nov 2017 06/23/2016  . Adjustment disorder with depressed mood 05/11/2016  . Atrial fibrillation ([place]) 03/18/2011  . Atrial flutter ([place]) 03/18/2011  . Hypertension 03/18/2011    [facility] ,[place], [place]  11/13/2020, 12:23 AM  [facility] [address] [place], [place], [address] Phone: [phone]   Fax:  [phone]  Name: Casey [name], Dr. MRN: [id number] Date of Birth: Nov 16, 1955

## 2020-11-14 ENCOUNTER — Telehealth: Payer: Self-pay | Admitting: Neurology

## 2020-11-14 NOTE — Telephone Encounter (Signed)
Pt called, congratulations on your retirement. Would like to transfer my care to Dr. Jaynee Eagles. Would like a call from the nurse.

## 2020-11-14 NOTE — Telephone Encounter (Signed)
Although I am very flattered and honored that patient would like to see me, Dr. Jannifer [name] is not retiring until the end of the year (as I understand it) and it is my understanding that Dr. Jannifer [name] and the practice will be looking at reassignments based on provider specialties and other factors as appropriate. But at this time that that is not occurring yet. Maybe we need to discuss this with new referrals and the phone staff so that when these requests are made they can have this information to give to patients instead of the many requests coming to physicians. Thank you.

## 2020-11-17 ENCOUNTER — Ambulatory Visit: Payer: PRIVATE HEALTH INSURANCE | Admitting: Neurology

## 2020-11-17 ENCOUNTER — Other Ambulatory Visit (HOSPITAL_BASED_OUTPATIENT_CLINIC_OR_DEPARTMENT_OTHER): Payer: Self-pay

## 2020-11-17 ENCOUNTER — Ambulatory Visit: Payer: Medicare Other | Attending: Internal Medicine

## 2020-11-17 ENCOUNTER — Other Ambulatory Visit: Payer: Self-pay

## 2020-11-17 DIAGNOSIS — Z23 Encounter for immunization: Secondary | ICD-10-CM

## 2020-11-17 MED ORDER — COVID-19 MRNA VACC (MODERNA) 100 MCG/0.5ML IM SUSP
INTRAMUSCULAR | 0 refills | Status: DC
  Filled 2020-11-17: qty 0.25, 1d supply, fill #0

## 2020-11-17 NOTE — Progress Notes (Signed)
   Covid-19 Vaccination Clinic  Name:  LYNELL KUSSMAN, Dr.    MRN: [id number] DOB: 04/06/1956  11/17/2020  Mr. Marzano was observed post Covid-19 immunization for 15 minutes without incident. He was provided with Vaccine Information Sheet and instruction to access the V-Safe system.   Mr. Weese was instructed to call 911 with any severe reactions post vaccine: Marland Kitchen Difficulty breathing  . Swelling of face and throat  . A fast heartbeat  . A bad rash all over body  . Dizziness and weakness   Immunizations Administered    Name Date Dose VIS Date Route   Moderna Covid-19 Booster Vaccine 11/17/2020  1:47 PM 0.25 mL 05/14/2020 Intramuscular   Manufacturer: Moderna   Lot: [id number]   [place]: 33825-053-97

## 2020-11-18 NOTE — Telephone Encounter (Signed)
Patient had appointment with Dr. Jannifer [name] on 4/25 but cancelled to establish care with Dr. Jaynee Eagles.  I discussed how the patient's would be assigned to new providers based on the specialty, however, D.r Jannifer [name] is still seeing patients until he retires.    Patient wanted to make another appointment with Dr. Jannifer [name] for routine care, scheduled 5/12/@ 0730 with arrival time of 0700.  Patient acknowledged and accepted.  Patient denied further questions, verbalized understanding and expressed appreciation for the phone call.

## 2020-12-04 ENCOUNTER — Ambulatory Visit: Payer: Self-pay | Admitting: Neurology

## 2020-12-29 ENCOUNTER — Telehealth: Payer: Self-pay | Admitting: Pulmonary Disease

## 2020-12-29 NOTE — Telephone Encounter (Signed)
Called and left message on voicemail to please return phone call. Contact number provided. 

## 2020-12-31 MED ORDER — TRELEGY ELLIPTA 100-62.5-25 MCG/INH IN AEPB: 1.0000 | INHALATION_SPRAY | Freq: Every day | RESPIRATORY_TRACT | 3 refills | Status: DC

## 2020-12-31 NOTE — Telephone Encounter (Signed)
PCCM:  He does have obstructive lung disease. If there was no difference when being on the trelegy he can go back to trelegy  As the high altitude sickness prevention.  2 drugs are considered options.  Acetazolamide or dexamethasone.  The risk of the development of high-altitude sickness is very low unless there is a previous medical history.  The only other time it is seen unless there is rapid Ascent up elevation without time for acclimatization.  Stable with normal travel plans should have little risk for development of high-altitude sickness.  PDE 5 inhibitors have only been researched for the development of high-altitude pulmonary edema prevention.  Currently not within guideline recommendations to my knowledge   Garner Nash, DO [facility] 12/31/2020 2:34 PM

## 2020-12-31 NOTE — Telephone Encounter (Signed)
Spoke with the pt and notified of response per Dr Valeta Harms  Pt verbalized understanding  States would like to resume the trelegy- I sent to pharm and d/c Breztri from [facility]   He states that he does have mild hx of high altitude sickness- occurred before on ski trip  He tried acetazolamide for this and states it was ineffective  He wants [place] to send in rx for dexamethasone  Please advise thanks

## 2020-12-31 NOTE — Telephone Encounter (Signed)
Spoke with the pt  He states that Dr Valeta Harms switched him from trelegy to breztri to help minimize tremor he was having on trelegy  He states he ended up d/c breztri due to severe hoarseness He seen his ENT partner and was advised may have vocal cord nodule  He has eval with [facility] today  Wonders if he should be using any inhaler Tremor did not really change when he was on breztri vs trelegy   He also states [place] and [place] be at an elevation of 8,000 ft  He states a friend told him that he might want to get a prescription to take with him for viagra and pred to help with altitude sickness  Dr Valeta Harms- please advise, thanks!

## 2021-01-02 MED ORDER — DEXAMETHASONE 2 MG PO TABS: 2.0000 mg | ORAL_TABLET | Freq: Two times a day (BID) | ORAL | 0 refills | Status: AC

## 2021-01-02 NOTE — Telephone Encounter (Signed)
[name], Octavio Graves, DO  Rosana Berger, CMA Caller: Unspecified (4 days ago,  9:19 AM)  2 mg dexamethasone, every 12 hours x2 days  Start day of [place].   Please advise that the steroid dose can increase blood glucose levels or make patient jittery feeling.   Garner Nash, DO  [facility]  01/01/2021 5:21 PM    I have sent rx to pharm. Pt is aware and was notified of recommendations per Dr Valeta Harms. Nothing further needed.

## 2021-02-10 ENCOUNTER — Ambulatory Visit: Payer: Medicare Other | Admitting: Pulmonary Disease

## 2021-03-16 ENCOUNTER — Other Ambulatory Visit: Payer: Self-pay | Admitting: Primary Care

## 2021-03-17 ENCOUNTER — Ambulatory Visit (INDEPENDENT_AMBULATORY_CARE_PROVIDER_SITE_OTHER): Payer: Medicare Other | Admitting: Pulmonary Disease

## 2021-03-17 ENCOUNTER — Encounter: Payer: Self-pay | Admitting: Pulmonary Disease

## 2021-03-17 ENCOUNTER — Other Ambulatory Visit: Payer: Self-pay

## 2021-03-17 VITALS — BP 124/82 | HR 68 | Temp 98.3°F | Ht 72.0 in | Wt 245.4 lb

## 2021-03-17 DIAGNOSIS — G4733 Obstructive sleep apnea (adult) (pediatric): Secondary | ICD-10-CM

## 2021-03-17 DIAGNOSIS — J449 Chronic obstructive pulmonary disease, unspecified: Secondary | ICD-10-CM | POA: Diagnosis not present

## 2021-03-17 DIAGNOSIS — J9611 Chronic respiratory failure with hypoxia: Secondary | ICD-10-CM | POA: Diagnosis not present

## 2021-03-17 NOTE — Assessment & Plan Note (Signed)
Given hypoxia burden on home sleep test we will check nocturnal oximetry on CPAP/room air

## 2021-03-17 NOTE — Assessment & Plan Note (Signed)
Auto CPAP download was reviewed which shows excellent control of events with average pressure of 8 cm and maximum pressure of 9 cm. Will change auto CPAP settings to 5 to 12 cm.  He has adjusted well to nasal pillows.  Compliance is good more than 6.5 hours with no missed nights.  Weight loss encouraged, his BMI is 33 and he would needed to be less than 32 to qualify for inspire device.  At this point he is not interested in an implant and would like to continue with CPAP.  We discussed cardiovascular benefits, at this point I am not sure how much more symptomatic benefit to expect

## 2021-03-17 NOTE — Progress Notes (Signed)
   Subjective:    Patient ID: Casey [name], Dr., male    DOB: [birth year], 65 y.o.   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  We set him up with auto CPAP 5 to 15 cm which he received after a short delay.  He has settled down with nasal pillows.  No dryness, pressure is okay, wife has not noted any snoring.  Unfortunately he does not feel more rested over increased energy. Breathing is at baseline he is on Trelegy but has not received any symptomatic benefit from this  Significant tests/ events reviewed 05/2018 ventilation perfusion scan: Low probability   10/2018: HRCT chest No evidence of interstitial lung disease resolution of right lung base opacity seen previously, left upper lobe lung nodule   03/14/2019: CTA chest Bilateral lower lobe acute pulmonary emboli with evidence of right ventricular strain RV LV ratio 1.2. Left upper lobe 5 mm sub-solid pulmonary nodule with groundglass    10/2019 CT chest wo contrast- Decreased size of left upper lobe nodule with a mean diameter of 4 mm, previously 6 mm and favored to reflect an infectious/inflammatory process.    08/2018 PFT:  FEV1 56%, ratio 68. No bronchodilator response. - Alpha 1 phenotype is MM   CPET ([facility] ) 10/2019 >> MVV 100%, pulmonary limitation to exercise, ratio 61, FEV1 59%, FVC 74%   HST 04/2020 >> AHI 23/h , 103 minutes with saturation less than 89%   Review of Systems neg for any significant sore throat, dysphagia, itching, sneezing, nasal congestion or excess/ purulent secretions, fever, chills, sweats,  unintended wt loss, pleuritic or exertional cp, hempoptysis, orthopnea pnd or change in chronic leg swelling. Also denies presyncope, palpitations, heartburn, abdominal pain, nausea, vomiting, diarrhea or change in bowel or urinary habits, dysuria,hematuria, rash, arthralgias, visual complaints, headache, numbness weakness or ataxia.     Objective:   Physical Exam  Gen. Pleasant, obese, in no distress ENT - no lesions, no post nasal drip Neck: No JVD, no thyromegaly, no carotid bruits Lungs: no use of accessory muscles, no dullness to percussion, decreased without rales or rhonchi  Cardiovascular: Rhythm regular, heart sounds  normal, no murmurs or gallops, no peripheral edema Musculoskeletal: No deformities, no cyanosis or clubbing , no tremors       Assessment & Plan:

## 2021-03-17 NOTE — Patient Instructions (Signed)
CPAP is working well Change to auto 5-12 cm Check ONO on CPAP/

## 2021-03-17 NOTE — Assessment & Plan Note (Signed)
Continue Trelegy and Singulair

## 2021-04-03 ENCOUNTER — Other Ambulatory Visit: Payer: Self-pay

## 2021-04-03 ENCOUNTER — Other Ambulatory Visit: Payer: Self-pay | Admitting: Internal Medicine

## 2021-04-03 ENCOUNTER — Ambulatory Visit
Admission: RE | Admit: 2021-04-03 | Discharge: 2021-04-03 | Disposition: A | Discharge status: Home / Self Care | Payer: Medicare Other | Source: Ambulatory Visit | Attending: Internal Medicine | Admitting: Internal Medicine

## 2021-04-03 DIAGNOSIS — R519 Headache, unspecified: Secondary | ICD-10-CM

## 2021-04-09 ENCOUNTER — Other Ambulatory Visit: Payer: Self-pay | Admitting: Internal Medicine

## 2021-04-09 DIAGNOSIS — I69998 Other sequelae following unspecified cerebrovascular disease: Secondary | ICD-10-CM

## 2021-04-13 ENCOUNTER — Other Ambulatory Visit: Payer: Self-pay | Admitting: Internal Medicine

## 2021-04-13 ENCOUNTER — Other Ambulatory Visit: Payer: Self-pay

## 2021-04-13 ENCOUNTER — Ambulatory Visit
Admission: RE | Admit: 2021-04-13 | Discharge: 2021-04-13 | Disposition: A | Discharge status: Home / Self Care | Payer: Medicare Other | Source: Ambulatory Visit | Attending: Internal Medicine | Admitting: Internal Medicine

## 2021-04-13 ENCOUNTER — Telehealth: Payer: Self-pay | Admitting: Pulmonary Disease

## 2021-04-13 DIAGNOSIS — R42 Dizziness and giddiness: Secondary | ICD-10-CM

## 2021-04-13 DIAGNOSIS — I69998 Other sequelae following unspecified cerebrovascular disease: Secondary | ICD-10-CM

## 2021-04-13 NOTE — Telephone Encounter (Signed)
ONO was placed on 03/17/2021. Patient has not been contacted by [facility].  PCC's please assist. Thanks

## 2021-04-14 ENCOUNTER — Encounter: Payer: Self-pay | Admitting: Neurology

## 2021-04-14 ENCOUNTER — Ambulatory Visit (INDEPENDENT_AMBULATORY_CARE_PROVIDER_SITE_OTHER): Payer: Medicare Other | Admitting: Neurology

## 2021-04-14 VITALS — BP 136/82 | HR 74 | Ht 72.0 in | Wt 247.0 lb

## 2021-04-14 DIAGNOSIS — G4489 Other headache syndrome: Secondary | ICD-10-CM

## 2021-04-14 NOTE — Telephone Encounter (Signed)
Received vm from [place] that she got my message and will check on this for me.

## 2021-04-14 NOTE — Telephone Encounter (Signed)
I called Casey [name] at [facility] to check status of order - was placed on 8/24 & confirmation was received from her on that date.

## 2021-04-14 NOTE — Progress Notes (Signed)
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  He has reported some shortness of breath and dyspnea on exertion since the pulmonary embolism.  He remains on anticoagulation.  The patient has been seen through this office in 2020 with reports a mild memory changes, this has not changed much over time.  The patient does have difficulty hearing, he has bilateral hearing aids.  He had onset of vertigo upon awakening around 03 April 2021.  He had some nausea and vomiting the night prior, but had true vertigo the next morning.  He had a mild bitemporal headache associated with this.  The vertigo has gradually improved over time.  The patient has noted some mild gait instability, but no falls.  He reports no double vision or loss of vision or any focal numbness or weakness of the face, arms, legs.  He does have borderline diabetes and has developed some slight numbness in the toes.  He has no weakness of the extremities.  He has not had any change in speech or swallowing.  He comes to this office for further evaluation.  A recent MRI of the brain done yesterday does not show any acute changes, mild small vessel changes are noted.  This was noted on the prior study in 2020.  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[place])    ongoing evaluation for elevated R>L adosterone secretion   Arthritis    Atrial enlargement, bilateral    Atrial fibrillation (HCC)    paroxysmal   Atrial flutter (HCC)    Complication of anesthesia    could not urinate after UHR, needed cath, mental fog sinc cervical fusion 08-09-16    Dysrhythmia    a-fib-had ablation x2   GERD (gastroesophageal reflux disease)    Hyperaldosteronism ([place])    Hypertension    Kidney cysts    "multiple"    Nephrolithiasis    Pneumonia 1990's   Pre-diabetes    Prostatitis    Renal insufficiency    "Cr 1.6"    Past Surgical History:  Procedure Laterality Date   ABLATION     ATRIAL FIBRILLATION ABLATION N/A 09/28/2012   Procedure: ATRIAL FIBRILLATION ABLATION;  Surgeon: Thompson Grayer, MD;  Location: [facility] CATH LAB;  Service: Cardiovascular;  Laterality: N/A;   atrial fibrillation ablation with CTI ablation  05/27/11, 09/28/12   ablation for afib and atrial flutter by Dr Norman Herrlich SURGERY  08/09/2016   C 5 6  C 6 to C 7 cervical fusion   CARDIOVERSION  07/28/2011   Procedure: CARDIOVERSION;  Surgeon: Lelon Perla, MD;  Location: [place];  Service: Cardiovascular;  Laterality: N/A;   COLONOSCOPY WITH PROPOFOL N/A 03/03/2015   Procedure: COLONOSCOPY WITH PROPOFOL;  Surgeon: Garlan Fair, MD;  Location[facility];  Service: Gastroenterology;  Laterality: N/A;   double j stent placement  06/2004   left   ESOPHAGOGASTRODUODENOSCOPY (EGD) WITH PROPOFOL N/A 03/03/2015   Procedure: ESOPHAGOGASTRODUODENOSCOPY (EGD) WITH PROPOFOL;  Surgeon: Garlan Fair, MD;  [facility]: [facility]OS[facility];  Service: Gastroenterology;  Laterality: N/A;   ESOPHAGOGASTRODUODENOSCOPY (EGD) WITH PROPOFOL N/A 10/25/2016   Procedure: ESOPHAGOGASTRODUODENOSCOPY (EGD) WITH PROPOFOL;  Surgeon: Garlan Fair, MD;  [facility];  Service: Endoscopy;  Laterality: N/A;   INGUINAL HERNIA  REPAIR     "as a child; ? right"   INGUINAL HERNIA REPAIR Left 06/23/2016   Procedure: OPEN LEFT INGUINAL HERNIA  REPAIR WITH MESH;  Surgeon: Johnathan Hausen, MD;  Location: [place];  Service: General;  Laterality: Left;   INSERTION OF MESH Left 06/23/2016   Procedure: INSERTION OF MESH;  Surgeon: Johnathan Hausen, MD;  Location: [place];  Service: General;  Laterality: Left;   LEFT HEART CATH AND CORONARY ANGIOGRAPHY N/A 07/25/2018   Procedure: LEFT HEART CATH AND CORONARY ANGIOGRAPHY;  Surgeon: Belva Crome, MD;  Location: [place] CV LAB;  Service: Cardiovascular;  Laterality: N/A;   LUNG BIOPSY  1993   NASAL SEPTOPLASTY W/ TURBINOPLASTY  07/2006   prostate biopsy  2011   schwannoma removal  ~01/2010   left thorax   TEE WITHOUT CARDIOVERSION N/A 09/27/2012   Procedure: TRANSESOPHAGEAL ECHOCARDIOGRAM (TEE);  Surgeon: Larey Dresser, MD;  Location: [place];  Service: Cardiovascular;  Laterality: N/A;   ULTRASOUND GUIDANCE FOR VASCULAR ACCESS  07/25/2018   Procedure: Ultrasound Guidance For Vascular Access;  Surgeon: Belva Crome, MD;  Location: [place] CV LAB;  Service: Cardiovascular;;   UMBILICAL HERNIA REPAIR  07/2006    Family History  Problem Relation Age of Onset   Atrial fibrillation Other        sister has had 2 afib ablations   Atrial fibrillation Sister     Social history:  reports that he has never smoked. He has never used smokeless tobacco. He reports current alcohol use of about 2.0 standard drinks per week. He reports that he does not use drugs.    Allergies  Allergen Reactions   Aldactone [Spironolactone]     Breast tissue enlargement   Codeine Other (See Comments)    Headache    Keflex [Cephalexin] Diarrhea   Lisinopril Cough   Nexium [Esomeprazole Magnesium] Diarrhea   Sulfa Antibiotics Rash   Sulfa Drugs Cross Reactors Rash    Medications:  Prior to Admission medications   Medication Sig Start Date End Date Taking? Authorizing Provider  allopurinol (ZYLOPRIM) 100 MG tablet Take 100 mg by mouth 2 (two) times daily.   Yes [provider]  apixaban (ELIQUIS) 5 MG TABS tablet Take 1 tablet (5 mg total) by mouth 2 (two) times daily. 04/11/19  Yes [name], Octavio Graves, DO  Ascorbic Acid (VITAMIN C) 1000 MG tablet Take 1,000 mg by mouth daily.   Yes [provider]  atorvastatin (LIPITOR) 10 MG tablet Take 10 mg by mouth every evening.    Yes [provider]  CIALIS 20 MG tablet Take 20 mg by mouth daily as needed for  erectile dysfunction.  09/23/16  Yes [provider]  COVID-19 mRNA vaccine, Moderna, 100 MCG/0.5ML injection Inject into the muscle. 11/17/20  Yes Carlyle Basques, MD  diltiazem (CARDIZEM CD) 360 MG 24 hr capsule Take 360 mg by mouth every evening. 10/04/16  Yes [provider]  eplerenone (INSPRA) 50 MG tablet Take 50 mg by mouth 2 (two) times daily.   Yes [provider]  Fluticasone-Umeclidin-Vilant (TRELEGY ELLIPTA) 100-62.5-25 MCG/INH AEPB Inhale 1 puff into the lungs daily. 12/31/20  Yes [name], Bradley L, DO  Glucosamine HCl (GLUCOSAMINE PO) Take by mouth.   Yes [provider]  hydrochlorothiazide (MICROZIDE) 12.5 MG capsule Take 12.5 mg by mouth daily.   Yes [provider]  losartan (COZAAR) 100 MG tablet Take 100 mg by mouth daily.   Yes  [provider]  MAGNESIUM PO Take 1 capsule by mouth daily.   Yes [provider]  metFORMIN (GLUCOPHAGE-XR) 500 MG 24 hr tablet SMARTSIG:2 Tablet(s) By Mouth Every Evening 04/02/21  Yes [provider]  montelukast (SINGULAIR) 10 MG tablet TAKE 1 TABLET(10 MG) BY MOUTH AT BEDTIME 03/18/21  Yes Martyn Ehrich, NP  Multiple Vitamins-Minerals (ZINC PO) Take 1 tablet by mouth 2 (two) times daily.   Yes [provider]  omeprazole (PRILOSEC) 40 MG capsule Take 40 mg by mouth daily before supper.    Yes [provider]  Potassium Gluconate 2.5 MEQ TABS Take 3 tablets by mouth 2 (two) times daily.   Yes [provider]  PROAIR HFA 108 [phone] Base) MCG/ACT inhaler SMARTSIG:2 Inhalation Via Inhaler Every 6 Hours PRN 12/26/19  Yes [provider]  sertraline (ZOLOFT) 100 MG tablet Take 100 mg by mouth daily. 06/18/20  Yes [provider]  vitamin B-12 (CYANOCOBALAMIN) 1000 MCG tablet Take 1,000 mcg by mouth daily.   Yes [provider]    ROS:  Out of a complete 14 system review of symptoms, the patient complains only of the following symptoms,  and all other reviewed systems are negative.  Dizziness, vertigo Mild memory change  Height 6' (1.829 m), weight 247 lb (112 kg).  Physical Exam  General: The patient is alert and cooperative at the time of the examination.  Ears: Tympanic membranes are clear bilaterally  Skin: No significant peripheral edema is noted.   Neurologic Exam  Mental status: The patient is alert and oriented x 3 at the time of the examination. The patient has apparent normal recent and remote memory, with an apparently normal attention span and concentration ability. MOCA score is 28/30.   Cranial nerves: Facial symmetry is present. Speech is normal, no aphasia or dysarthria is noted. Extraocular movements are full. Visual fields are full.  Pupils are equal, round, and reactive to light.  Discs are flat bilaterally.  Motor: The patient has good strength in all 4 extremities.  Sensory examination: Soft touch sensation is symmetric on the face, arms, and legs.  Coordination: The patient has good finger-nose-finger and heel-to-shin bilaterally.  Gait and station: The patient has a normal gait. Tandem gait is slightly unsteady.  Romberg is negative. No drift is seen.  Reflexes: Deep tendon reflexes are symmetric.   MRI brain 04/13/21:  IMPRESSION: No evidence of recent infarction, hemorrhage, or mass. Mild chronic microvascular ischemic changes. Tiny bilateral chronic cerebellar infarcts.  * MRI scan images were reviewed online. I agree with the written report.    Assessment/Plan:  1.  New onset of vertigo  2.  Mild memory disturbance  The reported vertigo sounds most consistent with a vestibulopathy.  There is no evidence of a new central nervous system event by MRI.  The patient will undergo blood work today given the fact that he has a mild headache with the dizziness.  He claims that in the past he has had a chronic mild elevation in the CRP level.  He will be followed over time for the  memory issues, he will follow-up here in 1 year, he may be seen by Dr. Jaynee Eagles.  He claims that the dizziness is gradually improving.  Jill Alexanders MD 04/14/2021 10:09 AM  [facility] [address] [place], [place] [address]  Phone [phone] Fax [phone]

## 2021-04-15 NOTE — Telephone Encounter (Signed)
I spoke to Danielle at [place] pt has been scheduled for ono for today.  Nothing further needed.

## 2021-04-20 ENCOUNTER — Telehealth: Payer: Self-pay | Admitting: Neurology

## 2021-04-20 NOTE — Telephone Encounter (Signed)
Blood work reveals a sedimentation rate of 10.

## 2021-04-23 ENCOUNTER — Telehealth: Payer: Self-pay | Admitting: Neurology

## 2021-04-23 NOTE — Telephone Encounter (Signed)
CRP blood work done through the primary care physician was 9, which is normal for the lab.

## 2021-07-28 ENCOUNTER — Telehealth: Payer: Self-pay | Admitting: Pulmonary Disease

## 2021-07-28 NOTE — Telephone Encounter (Signed)
Called and spoke with patient. He stated that he will be traveling to [place] next month. He is concerned about the change in altitude. He stated that the hotel he will be staying at does have oxygen on stand by in case someone needs it.   He wanted to see if BI had an objections to him going.   BI, can you please advise? Thanks!

## 2021-07-29 NOTE — Telephone Encounter (Signed)
Called and spoke with patient. He verbalized understanding of BI's recs. He stated that he had tried the medications for altitude sickness before but they did not help him, so he will refuse them at this time. He is aware that if he changes his mind to call us back.   Nothing further needed at time of call.

## 2021-08-24 ENCOUNTER — Ambulatory Visit: Payer: Medicare Other | Admitting: Internal Medicine

## 2021-08-24 ENCOUNTER — Ambulatory Visit: Payer: Medicare Other | Admitting: Student

## 2021-08-24 NOTE — Progress Notes (Signed)
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  Since last being seen in our clinic the patient reports doing very well. Took a trip around [place] last [place]. Does still have some DOE, follows with pulm. he denies chest pain, palpitations,  PND, orthopnea, nausea, vomiting, dizziness, syncope, weight gain, or early satiety. Peripheral edema is overall well controlled on HCtz  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HCC)    ongoing evaluation for elevated R>L adosterone secretion   Arthritis    Atrial enlargement, bilateral    Atrial fibrillation (HCC)    paroxysmal   Atrial flutter (HCC)    Complication of anesthesia    could not urinate after UHR, needed cath, mental fog sinc cervical fusion 08-09-16    Dysrhythmia    a-fib-had ablation x2   GERD (gastroesophageal reflux disease)    Hyperaldosteronism (HCC)    Hypertension    Kidney cysts    "multiple"   Nephrolithiasis    Pneumonia 1990's   Pre-diabetes    Prostatitis    Renal insufficiency    "Cr 1.6"   Past Surgical History:  Procedure Laterality Date   ABLATION     ATRIAL FIBRILLATION ABLATION N/A 09/28/2012   Procedure: ATRIAL FIBRILLATION ABLATION;  Surgeon: Casey Grayer, MD;  Location: [facility] CATH LAB;  Service: Cardiovascular;  Laterality: N/A;   atrial fibrillation ablation with CTI ablation  05/27/11, 09/28/12   ablation for afib and atrial flutter by Dr Norman Herrlich SURGERY  08/09/2016   C 5 6  C 6 to C 7 cervical fusion   CARDIOVERSION  07/28/2011   Procedure: CARDIOVERSION;  Surgeon: Lelon Perla, MD;  Location: [place];  Service: Cardiovascular;  Laterality: N/A;   COLONOSCOPY WITH PROPOFOL N/A 03/03/2015   Procedure: COLONOSCOPY WITH PROPOFOL;  Surgeon: Garlan Fair, MD;  [facility];  Service: Gastroenterology;  Laterality: N/A;   double j stent placement  06/2004   left   ESOPHAGOGASTRODUODENOSCOPY (EGD) WITH PROPOFOL N/A 03/03/2015   Procedure: ESOPHAGOGASTRODUODENOSCOPY (EGD) WITH PROPOFOL;  Surgeon: Garlan Fair, MD;  [facility];  Service: Gastroenterology;  Laterality: N/A;   ESOPHAGOGASTRODUODENOSCOPY (EGD) WITH PROPOFOL N/A 10/25/2016   Procedure: ESOPHAGOGASTRODUODENOSCOPY (EGD) WITH PROPOFOL;  Surgeon: Garlan Fair, MD;  [facility];  Service: Endoscopy;  Laterality: N/A;   INGUINAL HERNIA REPAIR     "as a child; ? right"   INGUINAL HERNIA REPAIR Left 06/23/2016   Procedure: OPEN LEFT INGUINAL HERNIA  REPAIR WITH MESH;  Surgeon: Johnathan Hausen, MD;  Location: [place];  Service: General;  Laterality: Left;   INSERTION OF MESH Left 06/23/2016   Procedure: INSERTION OF MESH;  Surgeon: Johnathan Hausen, MD;  Location: [place];  Service: General;  Laterality: Left;   LEFT HEART CATH AND CORONARY ANGIOGRAPHY N/A 07/25/2018   Procedure: LEFT HEART CATH AND CORONARY ANGIOGRAPHY;  Surgeon: Belva Crome, MD;  Location: [place] CV LAB;  Service: Cardiovascular;  Laterality: N/A;   LUNG BIOPSY  1993   NASAL SEPTOPLASTY W/ TURBINOPLASTY  07/2006   prostate biopsy  2011   schwannoma removal  ~01/2010   left thorax   TEE WITHOUT CARDIOVERSION N/A 09/27/2012   Procedure: TRANSESOPHAGEAL ECHOCARDIOGRAM (TEE);  Surgeon: Larey Dresser, MD;  Location: [facility]  ENDOSCOPY;  Service: Cardiovascular;  Laterality: N/A;   ULTRASOUND GUIDANCE FOR VASCULAR ACCESS  07/25/2018   Procedure: Ultrasound Guidance For Vascular Access;  Surgeon: Belva Crome, MD;  Location: [place] CV LAB;  Service: Cardiovascular;;   UMBILICAL HERNIA REPAIR  07/2006    Current Outpatient Medications  Medication Sig Dispense Refill   allopurinol (ZYLOPRIM) 100 MG tablet Take 100 mg by mouth 2 (two) times daily.     apixaban (ELIQUIS) 5  MG TABS tablet Take 1 tablet (5 mg total) by mouth 2 (two) times daily. 180 tablet 3   Ascorbic Acid (VITAMIN C) 1000 MG tablet Take 1,000 mg by mouth daily.     atorvastatin (LIPITOR) 10 MG tablet Take 10 mg by mouth every evening.      CIALIS 20 MG tablet Take 20 mg by mouth daily as needed for erectile dysfunction.   11   COVID-19 mRNA vaccine, Moderna, 100 MCG/0.5ML injection Inject into the muscle. 0.25 mL 0   diltiazem (CARDIZEM CD) 360 MG 24 hr capsule Take 360 mg by mouth every evening.  7   eplerenone (INSPRA) 50 MG tablet Take 50 mg by mouth 2 (two) times daily.     Fluticasone-Umeclidin-Vilant (TRELEGY ELLIPTA) 100-62.5-25 MCG/INH AEPB Inhale 1 puff into the lungs daily. 180 each 3   Glucosamine HCl (GLUCOSAMINE PO) Take by mouth.     hydrochlorothiazide (MICROZIDE) 12.5 MG capsule Take 12.5 mg by mouth daily.     losartan (COZAAR) 100 MG tablet Take 100 mg by mouth daily.     metFORMIN (GLUCOPHAGE-XR) 500 MG 24 hr tablet SMARTSIG:2 Tablet(s) By Mouth Every Evening     montelukast (SINGULAIR) 10 MG tablet TAKE 1 TABLET(10 MG) BY MOUTH AT BEDTIME 30 tablet 11   Multiple Vitamins-Minerals (ZINC PO) Take 1 tablet by mouth 2 (two) times daily.     omeprazole (PRILOSEC) 40 MG capsule Take 40 mg by mouth daily before supper.      Potassium Gluconate 2.5 MEQ TABS Take 3 tablets by mouth 2 (two) times daily.     PROAIR HFA 108 (90 Base) MCG/ACT inhaler SMARTSIG:2 Inhalation Via Inhaler Every 6 Hours PRN     sertraline (ZOLOFT) 100 MG tablet Take 100 mg by mouth daily.     vitamin B-12 (CYANOCOBALAMIN) 1000 MCG tablet Take 1,000 mcg by mouth daily.     No current facility-administered medications for this visit.    Allergies  Allergen Reactions   Aldactone [Spironolactone]     Breast tissue enlargement   Codeine Other (See Comments)    Headache    Keflex [Cephalexin] Diarrhea   Lisinopril Cough   Nexium [Esomeprazole Magnesium] Diarrhea   Sulfa Antibiotics Rash   Sulfa Drugs Cross  Reactors Rash    Social History   Socioeconomic History   Marital status: Married    Spouse name: Not on file   Number of children: Not on file   Years of education: Not on file   Highest education level: Not on file  Occupational History   Not on file  Tobacco Use   Smoking status: Never   Smokeless tobacco: Never  Vaping Use   Vaping Use: Never used  Substance and Sexual Activity   Alcohol use: Yes    Alcohol/week: 2.0 standard drinks    Types: 2 Shots of liquor per week    Comment: occasional, 05-11-2016 Per pt 3 drinks per wk   Drug use: No    Comment: 05-11-2016 per pt no   Sexual  activity: Yes  Other Topics Concern   Not on file  Social History Narrative   ** Merged History Encounter **       ENT physician in [place].   Social Determinants of Health   Financial Resource Strain: Not on file  Food Insecurity: Not on file  Transportation Needs: Not on file  Physical Activity: Not on file  Stress: Not on file  Social Connections: Not on file  Intimate Partner Violence: Not on file     Review of Systems: All other systems reviewed and are otherwise negative except as noted above.  Physical Exam: Vitals:   08/25/21 0834  BP: 132/82  Pulse: 79  SpO2: 96%  Weight: 243 lb 6.4 oz (110.4 kg)  Height: 6' (1.829 m)    GEN- The patient is well appearing, alert and oriented x 3 today.   HEENT: normocephalic, atraumatic; sclera clear, conjunctiva pink; hearing intact; oropharynx clear; neck supple, no JVP Lymph- no cervical lymphadenopathy Lungs- Clear to ausculation bilaterally, normal work of breathing.  No wheezes, rales, rhonchi Heart- Regular rate and rhythm, no murmurs, rubs or gallops, PMI not laterally displaced GI- soft, non-tender, non-distended, bowel sounds present, no hepatosplenomegaly Extremities- no clubbing, cyanosis, or edema; DP/PT/radial pulses 2+ bilaterally MS- no significant deformity or atrophy Skin- warm and dry, no rash or  lesion Psych- euthymic mood, full affect Neuro- strength and sensation are intact  EKG is ordered. Personal review of EKG from today shows NSr at 79 bpm  Additional studies reviewed include: Previous EP office notes.   Assessment and Plan:  1. PAF Well controlled off therapy Continue eliquis  2. PTE Followed by Dr. Corrin Parker at [facility]  3. HTN Stable on current regimen   Follow up with EP APP in 12 months   Shirley Friar, [place]  08/25/21 8:50 AM

## 2021-08-25 ENCOUNTER — Encounter: Payer: Self-pay | Admitting: Student

## 2021-08-25 ENCOUNTER — Other Ambulatory Visit: Payer: Self-pay

## 2021-08-25 ENCOUNTER — Ambulatory Visit (INDEPENDENT_AMBULATORY_CARE_PROVIDER_SITE_OTHER): Payer: Medicare Other | Admitting: Student

## 2021-08-25 VITALS — BP 132/82 | HR 79 | Ht 72.0 in | Wt 243.4 lb

## 2021-08-25 DIAGNOSIS — D6869 Other thrombophilia: Secondary | ICD-10-CM | POA: Diagnosis not present

## 2021-08-25 DIAGNOSIS — I10 Essential (primary) hypertension: Secondary | ICD-10-CM

## 2021-08-25 DIAGNOSIS — I480 Paroxysmal atrial fibrillation: Secondary | ICD-10-CM | POA: Diagnosis not present

## 2021-08-25 NOTE — Patient Instructions (Signed)
Medication Instructions:  Your physician recommends that you continue on your current medications as directed. Please refer to the Current Medication list given to you today.  *If you need a refill on your cardiac medications before your next appointment, please call your pharmacy*   Labwork: None If you have labs (blood work) drawn today and your tests are completely normal, you will receive your results only by: [place] (if you have MyChart) OR A paper copy in the mail If you have any lab test that is abnormal or we need to change your treatment, we will call you to review the results.  Follow-Up: At [facility], you and your health needs are our priority.  As part of our continuing mission to provide you with exceptional heart care, we have created designated Provider Care Teams.  These Care Teams include your primary Cardiologist (physician) and Advanced Practice Providers (APPs -  Physician Assistants and Nurse Practitioners) who all work together to provide you with the care you need, when you need it.   Your next appointment:   1 year(s)  The format for your next appointment:   In Person  Provider:   You may see Thompson Grayer, MD or one of the following Advanced Practice Providers on your designated Care Team:   Tommye Standard, [place] Legrand Como "[facility]" [place], [place]

## 2021-10-27 ENCOUNTER — Telehealth: Payer: Self-pay | Admitting: Pulmonary Disease

## 2021-10-27 NOTE — Telephone Encounter (Signed)
Called patient and he states that is is traveling across country in the middle of August and wants to make sure that it is still safe to hold of on the covid vaccine until the winter.  ? ?Please advise Dr Elsworth Soho ?

## 2021-10-27 NOTE — Telephone Encounter (Signed)
Dr Elsworth Soho please advise:  ? ?Pt wants to know if he should get another covid vaccine. States his last was Septemer 22. Pt sees both RA and BI. Please advise ?

## 2021-10-28 NOTE — Telephone Encounter (Signed)
Called and spoke with patient. He verbalized understanding. ? ?Nothing further needed at time of call.  ?

## 2022-02-10 ENCOUNTER — Encounter: Payer: Medicare Other | Admitting: Sports Medicine

## 2022-02-10 ENCOUNTER — Telehealth: Payer: Self-pay | Admitting: Internal Medicine

## 2022-02-10 NOTE — Telephone Encounter (Signed)
FYI: Pt called at 1:40 to let us know of his missed appt.

## 2022-02-10 NOTE — Telephone Encounter (Signed)
Just now getting this, put him into wherever my next opening is.

## 2022-02-11 NOTE — Telephone Encounter (Signed)
No leave it in there.

## 2022-02-22 ENCOUNTER — Ambulatory Visit (INDEPENDENT_AMBULATORY_CARE_PROVIDER_SITE_OTHER): Payer: Medicare Other

## 2022-02-22 ENCOUNTER — Ambulatory Visit (INDEPENDENT_AMBULATORY_CARE_PROVIDER_SITE_OTHER): Payer: Medicare Other | Admitting: Sports Medicine

## 2022-02-22 DIAGNOSIS — M7062 Trochanteric bursitis, left hip: Secondary | ICD-10-CM | POA: Diagnosis not present

## 2022-02-22 NOTE — Patient Instructions (Signed)
Hip Rehabilitation Protocol:  1.  Side leg raises.  3x30 with no weight, then 3x15 with 2 lb ankle weight, then 3x15 with 5 lb ankle weight 2.  Standing hip rotation.  3x30 with no weight, then 3x15 with 2 lb ankle weight, then 3x15 with 5 lb ankle weight. 3.  Side step ups.  3x30 with no weight, then 3x15 with 5 lbs in backpack, then 3x15 with 10 lbs in backpack. 

## 2022-02-22 NOTE — Assessment & Plan Note (Signed)
This is a very pleasant 66 year old male, retired Industrial/product designer, for the past 6 weeks has had increasing pain left lateral hip after doing a set of squats. On exam he has tenderness at the greater trochanter, as well as profoundly weak hip abductor's on the ipsilateral side. We discussed the anatomy and pathophysiology, he is agreeable to try aggressive hip abductor conditioning before proceeding with a trochanteric bursa/gluteus medius/minimus injection. Return to see me in 6 weeks, he will use Tylenol for pain, he is on Eliquis for PE so unable to use NSAIDs.

## 2022-02-22 NOTE — Progress Notes (Signed)
    Procedures performed today:    None.  Independent interpretation of notes and tests performed by another provider:   None.  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 On exam he has tenderness at the greater trochanter, as well as profoundly weak hip abductor's on the ipsilateral side. We discussed the anatomy and pathophysiology, he is agreeable to try aggressive hip abductor conditioning before proceeding with a trochanteric bursa/gluteus medius/minimus injection. Return to see me in 6 weeks, he will use Tylenol for pain, he is on Eliquis for PE so unable to use NSAIDs.    ____________________________________________ Gwen Her. Dianah Field, M.D., ABFM., CAQSM., AME. Primary Care and Sports Medicine [facility]  Adjunct Professor of [employer] of Medicine  Risk manager

## 2022-03-14 ENCOUNTER — Other Ambulatory Visit: Payer: Self-pay | Admitting: Primary Care

## 2022-04-05 ENCOUNTER — Ambulatory Visit (INDEPENDENT_AMBULATORY_CARE_PROVIDER_SITE_OTHER): Payer: Medicare Other | Admitting: Sports Medicine

## 2022-04-05 DIAGNOSIS — M7062 Trochanteric bursitis, left hip: Secondary | ICD-10-CM | POA: Diagnosis not present

## 2022-04-05 NOTE — Progress Notes (Signed)
    Procedures performed today:    None.  Independent interpretation of notes and tests performed by another provider:   None.  Brief History, Exam, Impression, and Recommendations:    Trochanteric bursitis, left hip Pleasant 66 year old male, he is a retired Geophysical data processor, for the past 10 weeks now has had increasing pain left lateral hip, this started after doing a set of squats. On exam he had tenderness at the greater trochanter and weak hip abductors, he has been doing some conditioning and strength has improved. He has no pain with internal rotation of the hip and no pain in the back. Unfortunately still has some pain, he does feel as though he could get more consistent with home conditioning so he will do this for another 4 weeks. If insufficient improvement we will do a trochanteric bursa/hip abductor injection at the follow-up visit. Of note on Eliquis so unable to use NSAIDs.    ____________________________________________ Gwen Her. Dianah Field, M.D., ABFM., CAQSM., AME. Primary Care and Sports Medicine [facility]  Adjunct Professor of [place] [employer] [facility] [employer]  Risk manager

## 2022-04-05 NOTE — Assessment & Plan Note (Signed)
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 On exam he had tenderness at the greater trochanter and weak hip abductors, he has been doing some conditioning and strength has improved. He has no pain with internal rotation of the hip and no pain in the back. Unfortunately still has some pain, he does feel as though he could get more consistent with home conditioning so he will do this for another 4 weeks. If insufficient improvement we will do a trochanteric bursa/hip abductor injection at the follow-up visit. Of note on Eliquis so unable to use NSAIDs.

## 2022-04-20 ENCOUNTER — Ambulatory Visit (INDEPENDENT_AMBULATORY_CARE_PROVIDER_SITE_OTHER): Payer: Medicare Other | Admitting: Neurology

## 2022-04-20 ENCOUNTER — Encounter: Payer: Self-pay | Admitting: Neurology

## 2022-04-20 VITALS — BP 118/83 | HR 67 | Ht 72.0 in | Wt 234.6 lb

## 2022-04-20 DIAGNOSIS — I639 Cerebral infarction, unspecified: Secondary | ICD-10-CM

## 2022-04-20 DIAGNOSIS — E519 Thiamine deficiency, unspecified: Secondary | ICD-10-CM | POA: Diagnosis not present

## 2022-04-20 DIAGNOSIS — R5383 Other fatigue: Secondary | ICD-10-CM

## 2022-04-20 DIAGNOSIS — I679 Cerebrovascular disease, unspecified: Secondary | ICD-10-CM

## 2022-04-20 DIAGNOSIS — R4189 Other symptoms and signs involving cognitive functions and awareness: Secondary | ICD-10-CM

## 2022-04-20 DIAGNOSIS — E538 Deficiency of other specified B group vitamins: Secondary | ICD-10-CM

## 2022-04-20 DIAGNOSIS — E559 Vitamin D deficiency, unspecified: Secondary | ICD-10-CM

## 2022-04-20 NOTE — Progress Notes (Signed)
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 He is here for follow up of headache and mild memory concern he was a patient of Dr. Jannifer [name]'. His wife feels like he isn;t as sharp as he used to be. He does not feel like his memory is changing but he has been seeing Dr. Jannifer [name] for memory since 2020. He missed a bill but it got buried under mail and that has only happened 1-2 times a year. He has difficulty hearing. Still social, still interested in hobbies, they travel and just went down the [place]. He plays tennis one night a week. They travel, they have dinner with friends every Thursday. MoCA has been stable. Not getting lost or having accidents. Mom was "frail" at 75 physically and mentally not as sharp, father died at 45 he was sick but none diagnosed with dementia, 3 little sisters and they are all fine as far as memory/cognition. He usually knows what day it is but harder in retirement.he loses words and sometimes when he practices still once a week it is a little harder.  No other focal neurologic deficits, associated symptoms, inciting events or modifiable factors.  Patient complains of symptoms per HPI as well as the following symptoms: memory loss . Pertinent negatives and positives per HPI. All others negative  Reviewed mri images and agree: EXAM: MRI HEAD WITHOUT CONTRAST   TECHNIQUE: Multiplanar, multiecho pulse sequences of the brain and surrounding structures were obtained without intravenous contrast.   COMPARISON:  December 2020   FINDINGS: 03/2021 Brain: There is no acute infarction or intracranial hemorrhage. There is no intracranial mass, mass effect, or edema. There is no hydrocephalus or extra-axial fluid collection. Prominence of the ventricles and sulci reflects generalized parenchymal volume  loss. Patchy T2 hyperintensity in the supratentorial white matter is nonspecific but may reflect mild chronic microvascular ischemic changes. Very small bilateral chronic cerebellar infarcts.   Vascular: Major vessel flow voids at the skull base are preserved.   Skull and upper cervical spine: Normal marrow signal is preserved.   Sinuses/Orbits: Paranasal sinuses are aerated. Orbits are unremarkable.   Other: Sella is unremarkable.  Mastoid air cells are clear.   IMPRESSION: No evidence of recent infarction, hemorrhage, or mass. Mild chronic microvascular ischemic changes. Tiny bilateral chronic cerebellar infarcts.   Patient complains of symptoms per HPI as well as the following symptoms: subjective memory concerns . Pertinent negatives and positives per HPI. All others negative   History of present illness: Last 04/14/2021 Dr. 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  He has reported some shortness of breath and dyspnea on exertion since the pulmonary embolism.  He remains on anticoagulation.  The patient has been seen through this office in 2020 with reports a mild memory changes, this has not changed much over time.  The patient does have difficulty hearing, he has bilateral hearing aids.  He had onset of vertigo upon awakening around 03 April 2021.  He had some nausea and vomiting the night prior, but had true vertigo the next morning.  He had a mild bitemporal headache associated with this.  The vertigo has gradually improved over time.  The patient has noted some mild gait instability, but no falls.  He reports no double vision or loss of vision or any focal numbness or weakness of the face, arms, legs.  He does have borderline diabetes and has developed some slight numbness in the toes.  He has no weakness of the extremities.  He has not had any change in speech or swallowing.  He comes to  this office for further evaluation.  A recent MRI of the brain done yesterday does not show any acute changes, mild small vessel changes are noted.  This was noted on the prior study in 2020.  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[place])    ongoing evaluation for elevated R>L adosterone secretion   Arthritis    Atrial enlargement, bilateral    Atrial fibrillation (HCC)    paroxysmal   Atrial flutter (HCC)    Complication of anesthesia    could not urinate after UHR, needed cath, mental fog sinc cervical fusion 08-09-16    Dysrhythmia    a-fib-had ablation x2   GERD (gastroesophageal reflux disease)    Hyperaldosteronism ([place])    Hypertension    Kidney cysts    "multiple"   Nephrolithiasis    Pneumonia 1990's   Pre-diabetes    Prostatitis    Renal insufficiency    "Cr 1.6"    Past Surgical History:  Procedure Laterality Date   ABLATION     ATRIAL FIBRILLATION ABLATION N/A 09/28/2012   Procedure: ATRIAL FIBRILLATION ABLATION;  Surgeon: Thompson Grayer, MD;  Location: [facility] CATH LAB;  Service: Cardiovascular;  Laterality: N/A;   atrial fibrillation ablation with CTI ablation  05/27/11, 09/28/12   ablation for afib and atrial flutter by Dr Norman Herrlich SURGERY  08/09/2016   C 5 6  C 6 to C 7 cervical fusion   CARDIOVERSION  07/28/2011   Procedure: CARDIOVERSION;  Surgeon: Lelon Perla, MD;  Location: [facility];  Service: Cardiovascular;  Laterality: N/A;   COLONOSCOPY WITH PROPOFOL N/A 03/03/2015   Procedure: COLONOSCOPY WITH PROPOFOL;  Surgeon: Garlan Fair, MD;  Location: [facility];  Service: Gastroenterology;  Laterality: N/A;   double j stent placement  06/2004   left   ESOPHAGOGASTRODUODENOSCOPY (EGD) WITH PROPOFOL N/A 03/03/2015   Procedure: ESOPHAGOGASTRODUODENOSCOPY (EGD) WITH PROPOFOL;  Surgeon: Garlan Fair, MD;  Location: [facility] ENDOSCOPY;  Service: Gastroenterology;  Laterality: N/A;   ESOPHAGOGASTRODUODENOSCOPY (EGD) WITH PROPOFOL N/A  10/25/2016   Procedure: ESOPHAGOGASTRODUODENOSCOPY (EGD) WITH PROPOFOL;  Surgeon: Garlan Fair, MD;  Location: [facility] ENDOSCOPY;  Service: Endoscopy;  Laterality: N/A;   INGUINAL HERNIA REPAIR     "as a child; ? right"   INGUINAL HERNIA REPAIR Left 06/23/2016   Procedure: OPEN LEFT INGUINAL HERNIA  REPAIR WITH MESH;  Surgeon: Johnathan Hausen, MD;  Location: [facility];  Service: General;  Laterality: Left;   INSERTION OF MESH Left 06/23/2016   Procedure: INSERTION OF MESH;  Surgeon: Johnathan Hausen, MD;  Location: [facility];  Service: General;  Laterality: Left;   LEFT HEART CATH AND CORONARY ANGIOGRAPHY N/A 07/25/2018   Procedure: LEFT HEART CATH AND CORONARY ANGIOGRAPHY;  Surgeon: Belva Crome, MD;  Location: [facility];  Service: Cardiovascular;  Laterality: N/A;   LUNG BIOPSY  1993   NASAL SEPTOPLASTY W/ TURBINOPLASTY  07/2006   prostate biopsy  2011   schwannoma removal  ~01/2010   left thorax   TEE WITHOUT CARDIOVERSION N/A 09/27/2012   Procedure: TRANSESOPHAGEAL ECHOCARDIOGRAM (TEE);  Surgeon: Larey Dresser,  MD;  Location: [facility];  Service: Cardiovascular;  Laterality: N/A;   ULTRASOUND GUIDANCE FOR VASCULAR ACCESS  07/25/2018   Procedure: Ultrasound Guidance For Vascular Access;  Surgeon: Belva Crome, MD;  Location: [facility] CV LAB;  Service: Cardiovascular;;   UMBILICAL HERNIA REPAIR  07/2006    Family History  Problem Relation Age of Onset   Atrial fibrillation Sister    Atrial fibrillation Other        sister has had 2 afib ablations   Alzheimer's disease Neg Hx    Dementia Neg Hx     Social history:  reports that he has never smoked. He has never used smokeless tobacco. He reports that he does not currently use alcohol after a past usage of about 4.0 standard drinks of alcohol per week. He reports that he does not use drugs.    Allergies  Allergen Reactions   Aldactone [Spironolactone]     Breast tissue enlargement    Codeine Other (See Comments)    Headache    Keflex [Cephalexin] Diarrhea   Lisinopril Cough   Nexium [Esomeprazole Magnesium] Diarrhea   Sulfa Antibiotics Rash   Sulfa Drugs Cross Reactors Rash    Medications:  Prior to Admission medications   Medication Sig Start Date End Date Taking? Authorizing Provider  allopurinol (ZYLOPRIM) 100 MG tablet Take 100 mg by mouth 2 (two) times daily.   Yes [provider]  apixaban (ELIQUIS) 5 MG TABS tablet Take 1 tablet (5 mg total) by mouth 2 (two) times daily. 04/11/19  Yes [name], Octavio Graves, DO  Ascorbic Acid (VITAMIN C) 1000 MG tablet Take 1,000 mg by mouth daily.   Yes [provider]  atorvastatin (LIPITOR) 10 MG tablet Take 10 mg by mouth every evening.    Yes [provider]  CIALIS 20 MG tablet Take 20 mg by mouth daily as needed for erectile dysfunction.  09/23/16  Yes [provider]  COVID-19 mRNA vaccine, Moderna, 100 MCG/0.5ML injection Inject into the muscle. 11/17/20  Yes Carlyle Basques, MD  diltiazem (CARDIZEM CD) 360 MG 24 hr capsule Take 360 mg by mouth every evening. 10/04/16  Yes [provider]  eplerenone (INSPRA) 50 MG tablet Take 50 mg by mouth 2 (two) times daily.   Yes [provider]  Fluticasone-Umeclidin-Vilant (TRELEGY ELLIPTA) 100-62.5-25 MCG/INH AEPB Inhale 1 puff into the lungs daily. 12/31/20  Yes [name], Bradley L, DO  Glucosamine HCl (GLUCOSAMINE PO) Take by mouth.   Yes [provider]  hydrochlorothiazide (MICROZIDE) 12.5 MG capsule Take 12.5 mg by mouth daily.   Yes [provider]  losartan (COZAAR) 100 MG tablet Take 100 mg by mouth daily.   Yes [provider]  MAGNESIUM PO Take 1 capsule by mouth daily.   Yes [provider]  metFORMIN (GLUCOPHAGE-XR) 500 MG 24 hr tablet SMARTSIG:2 Tablet(s) By Mouth Every Evening 04/02/21  Yes [provider]  montelukast (SINGULAIR) 10 MG tablet TAKE 1 TABLET(10 MG) BY MOUTH AT BEDTIME  03/18/21  Yes Martyn Ehrich, NP  Multiple Vitamins-Minerals (ZINC PO) Take 1 tablet by mouth 2 (two) times daily.   Yes [provider]  omeprazole (PRILOSEC) 40 MG capsule Take 40 mg by mouth daily before supper.    Yes [provider]  Potassium Gluconate 2.5 MEQ TABS Take 3 tablets by mouth 2 (two) times daily.   Yes [provider]  PROAIR HFA 108 [id number] Base) MCG/ACT inhaler SMARTSIG:2 Inhalation Via Inhaler Every 6 Hours PRN  12/26/19  Yes [provider]  sertraline (ZOLOFT) 100 MG tablet Take 100 mg by mouth daily. 06/18/20  Yes [provider]  vitamin B-12 (CYANOCOBALAMIN) 1000 MCG tablet Take 1,000 mcg by mouth daily.   Yes [provider]    Blood pressure 118/83, pulse 67, height 6' (1.829 m), weight 234 lb 9.6 oz (106.4 kg).   Exam: NAD, pleasant                  Speech:    Speech is normal; fluent and spontaneous with normal comprehension.  Cognition:    The patient is oriented to person, place, and time;     recent and remote memory intact;     language fluent;    Cranial Nerves:    The pupils are equal, round, and reactive to light.Trigeminal sensation is intact and the muscles of mastication are normal. The face is symmetric. The palate elevates in the midline. Hearing intact. Voice is normal. Shoulder shrug is normal. The tongue has normal motion without fasciculations.   Coordination:  No dysmetria  Motor Observation:    No asymmetry, no atrophy, and no involuntary movements noted. Tone:    Normal muscle tone.     Strength:    Strength is V/V in the upper and lower limbs.      Sensation: absent vibration to the medial malleoli, decreased pp and temp distally in a gradient fashion, intact proprioception at great toe      04/20/2022    8:43 AM 04/14/2021   10:48 AM 06/04/2019    4:59 PM  Montreal Cognitive Assessment   Visuospatial/ Executive (0/5) '4 4 5  '$ Naming (0/3) '3 3 3  '$ Attention: Read list of digits  (0/2) '2 2 2  '$ Attention: Read list of letters (0/1) '1 1 1  '$ Attention: Serial 7 subtraction starting at 100 (0/3) '3 3 3  '$ Language: Repeat phrase (0/2) '2 2 2  '$ Language : Fluency (0/1) '1 1 1  '$ Abstraction (0/2) '2 2 2  '$ Delayed Recall (0/5) '5 4 2  '$ Orientation (0/6) '6 6 6  '$ Total '29 28 27  '$ Adjusted Score (based on education)   27     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, imbalance and distal peripheral neuropathy  - Neuropsychiatric testing for reported memory changes since 2020, we can get a baseline for him with formal neuropsych testing and blood work - Peripheral neuropathy likely due to diabetes, but will also check b12 and B1 - Imbalance: Fall precautions, likely multifactorial including peripheral neuropathy, recommended PT or balance exercises  Sarina Ill MD  Orders Placed This Encounter  Procedures   B12 and Folate Panel   Methylmalonic acid, serum   Vitamin B1   Homocysteine   Vitamin D, 25-hydroxy   TSH Rfx on Abnormal to Free T4   Ambulatory referral to Neuropsychology    [facility] [address] [place], [place] [address]  Phone [phone] Fax [phone]  I spent over 45 minutes of face-to-face and non-face-to-face time with patient on the  1. Subjective memory complaints   2. screen for B12 deficiency   3. screen for Vitamin B1 deficiency   4. Cerebrovascular disease   5. Other fatigue   6. screen for Vitamin D deficiency   7. Cerebrovascular accident (CVA), unspecified mechanism ([place])    diagnosis.  This included previsit chart review, lab review, study review, order entry, electronic health record documentation, patient education on the different diagnostic and therapeutic  options, counseling and coordination of care, risks and benefits of management, compliance, or risk factor reduction

## 2022-04-20 NOTE — Patient Instructions (Signed)
Blood work today Formal neurocognitive testing Follow in one year  Memory Compensation Strategies  Use "WARM" strategy.  W= write it down  A= associate it  R= repeat it  M= make a mental note  2.   You can keep a Social worker.  Use a 3-ring notebook with sections for the following: calendar, important names and phone numbers,  medications, doctors' names/phone numbers, lists/reminders, and a section to journal what you did  each day.   3.    Use a calendar to write appointments down.  4.    Write yourself a schedule for the day.  This can be placed on the calendar or in a separate section of the Memory Notebook.  Keeping a  regular schedule can help memory.  5.    Use medication organizer with sections for each day or morning/evening pills.  You may need help loading it  6.    Keep a basket, or pegboard by the door.  Place items that you need to take out with you in the basket or on the pegboard.  You may also want to  include a message board for reminders.  7.    Use sticky notes.  Place sticky notes with reminders in a place where the task is performed.  For example: " turn off the  stove" placed by the stove, "lock the door" placed on the door at eye level, " take your medications" on  the bathroom mirror or by the place where you normally take your medications.  8.    Use alarms/timers.  Use while cooking to remind yourself to check on food or as a reminder to take your medicine, or as a  reminder to make a call, or as a reminder to perform another task, etc.

## 2022-04-23 LAB — B12 AND FOLATE PANEL
Folate: 9.3 ng/mL (ref >3.0)
Vitamin B-12: 314 pg/mL (ref 232–1245)

## 2022-04-23 LAB — TSH RFX ON ABNORMAL TO FREE T4: TSH: 1.47 u[IU]/mL (ref 0.450–4.500)

## 2022-04-23 LAB — METHYLMALONIC ACID, SERUM: Methylmalonic Acid: 274 nmol/L (ref 0–378)

## 2022-04-23 LAB — VITAMIN B1: Thiamine: 120 nmol/L (ref 66.5–200.0)

## 2022-04-23 LAB — VITAMIN D 25 HYDROXY (VIT D DEFICIENCY, FRACTURES): Vit D, 25-Hydroxy: 34 ng/mL (ref 30.0–100.0)

## 2022-04-23 LAB — HOMOCYSTEINE: Homocysteine: 18.7 umol/L — ABNORMAL HIGH (ref 0.0–17.2)

## 2022-04-25 ENCOUNTER — Other Ambulatory Visit: Payer: Self-pay | Admitting: Neurology

## 2022-04-25 DIAGNOSIS — R7989 Other specified abnormal findings of blood chemistry: Secondary | ICD-10-CM

## 2022-04-25 DIAGNOSIS — I7091 Generalized atherosclerosis: Secondary | ICD-10-CM

## 2022-04-28 ENCOUNTER — Encounter: Payer: Self-pay | Admitting: Psychology

## 2022-05-04 ENCOUNTER — Encounter: Payer: Self-pay | Admitting: Pulmonary Disease

## 2022-05-04 ENCOUNTER — Ambulatory Visit (INDEPENDENT_AMBULATORY_CARE_PROVIDER_SITE_OTHER): Payer: Medicare Other | Admitting: Pulmonary Disease

## 2022-05-04 VITALS — BP 134/82 | HR 61 | Temp 98.0°F | Ht 72.0 in | Wt 236.2 lb

## 2022-05-04 DIAGNOSIS — I639 Cerebral infarction, unspecified: Secondary | ICD-10-CM

## 2022-05-04 DIAGNOSIS — J45909 Unspecified asthma, uncomplicated: Secondary | ICD-10-CM | POA: Diagnosis not present

## 2022-05-04 DIAGNOSIS — G4733 Obstructive sleep apnea (adult) (pediatric): Secondary | ICD-10-CM

## 2022-05-04 DIAGNOSIS — J449 Chronic obstructive pulmonary disease, unspecified: Secondary | ICD-10-CM

## 2022-05-04 NOTE — Assessment & Plan Note (Signed)
CPAP download was reviewed which shows excellent control of events on auto settings 5 to 12 cm with average pressure of 9 cm and maximum pressure of 10.  He has mild leak.  He has good compliance with 6.5 hours average on nights used.  We discussed alternatives including oral appliance and hypoglossal nerve stimulation therapy.  He would like to avoid implant at this time. We will repeat home sleep test given his weight loss and CF OSA has improved.  If so we can ask him to pursue oral appliance more aggressively

## 2022-05-04 NOTE — Assessment & Plan Note (Signed)
Spirometry was repeated today and FEV1 seems to have improved to 67%.  He has not noticed any subjective benefit from Trelegy.  He does not seem to have day-to-day variability to suggest asthma.  Would be okay to stay off Trelegy and use albuterol on as-needed basis and see how he does over the next few months.  We discussed benefit of triple therapy to decrease exacerbations but he has not had any exacerbations over the past 2 years

## 2022-05-04 NOTE — Progress Notes (Signed)
   Subjective:    Patient ID: Casey Alias, MD, male    DOB: [birth year], 66 y.o.   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 Breztri caused him hoarseness, he was seen by ENT who found a vocal nodule, he was referred to [facility] voice center.  He is now back on Trelegy but admits to poor compliance with this.  His breathing has been stable.  He can walk 3 miles with [place] but has difficulty walking uphill. He has been evaluated by neurology for memory issues.  He had to cancel his trip to [facility], recently completed a cruise in [place] and on his way back contracted [place].  He appears to be more sedentary since this episode for the last 1 to 2 months.  He has been compliant with CPAP and denies any problems with mask or pressure but feels that this is a nuisance and would like to discuss alternatives He is lost about 10 pounds from 245 to his current weight of 236 pounds  Significant tests/ events reviewed 05/2018 ventilation perfusion scan: Low probability   10/2018: HRCT chest No evidence of interstitial lung disease resolution of right lung base opacity seen previously, left upper lobe lung nodule   03/14/2019: CTA chest Bilateral lower lobe acute pulmonary emboli with evidence of right ventricular strain RV LV ratio 1.2. Left upper lobe 5 mm sub-solid pulmonary nodule with groundglass    10/2019 CT chest wo contrast- Decreased size of left upper lobe nodule with a mean diameter of 4 mm, previously 6 mm and favored to reflect an infectious/inflammatory process.    04/2022  spirometry-FEV1 67%, ratio 71, FVC 70% - 08/2018 PFT:  FEV1 56%, ratio 68. No bronchodilator response. - Alpha 1 phenotype is MM   CPET ([facility] ) 10/2019 >> MVV 100%, pulmonary limitation to exercise, ratio 61, FEV1 59%, FVC 74%   HST 04/2020 >> AHI 23/h , 103 minutes with saturation less than 89%   Review of Systems neg for any significant sore throat, dysphagia, itching, sneezing, nasal congestion or excess/ purulent secretions, fever, chills, sweats, unintended wt loss, pleuritic or exertional cp, hempoptysis, orthopnea pnd or change in chronic leg swelling. Also denies presyncope, palpitations, heartburn, abdominal pain, nausea, vomiting, diarrhea or change in bowel or urinary habits, dysuria,hematuria, rash, arthralgias, visual complaints, headache, numbness weakness or ataxia.     Objective:   Physical Exam   Gen. Pleasant, obese, in no distress ENT - no lesions, no post nasal drip Neck: No JVD, no thyromegaly, no carotid bruits Lungs: no use of accessory muscles, no dullness to percussion, decreased without rales or rhonchi  Cardiovascular: Rhythm regular, heart sounds  normal, no murmurs or gallops, no peripheral edema Musculoskeletal: No deformities, no cyanosis or clubbing , no tremors        Assessment & Plan:

## 2022-05-04 NOTE — Patient Instructions (Signed)
X spirometry  X Home sleep test  CPAP is working well Discuss with dentist about oral appliance or I can refer if you ar einterested

## 2022-05-17 ENCOUNTER — Ambulatory Visit (INDEPENDENT_AMBULATORY_CARE_PROVIDER_SITE_OTHER): Payer: Medicare Other | Admitting: Sports Medicine

## 2022-05-17 DIAGNOSIS — I639 Cerebral infarction, unspecified: Secondary | ICD-10-CM | POA: Diagnosis not present

## 2022-05-17 DIAGNOSIS — M7062 Trochanteric bursitis, left hip: Secondary | ICD-10-CM | POA: Diagnosis not present

## 2022-05-17 NOTE — Assessment & Plan Note (Signed)
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 At this point his pain is better enough where we do not need to consider ultrasound-guided injections. He can return to see me 6 weeks as needed.

## 2022-05-17 NOTE — Progress Notes (Signed)
    Procedures performed today:    None.  Independent interpretation of notes and tests performed by another provider:   None.  Brief History, Exam, Impression, and Recommendations:    Trochanteric bursitis, left hip 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 At this point his pain is better enough where we do not need to consider ultrasound-guided injections. He can return to see me 6 weeks as needed.    ____________________________________________ Gwen Her. Dianah Field, M.D., ABFM., CAQSM., AME. Primary Care and Sports Medicine [facility]  Adjunct Professor of [place] [employer] [facility] of Medicine  Risk manager

## 2022-05-18 ENCOUNTER — Ambulatory Visit: Payer: Medicare Other

## 2022-05-18 DIAGNOSIS — G4733 Obstructive sleep apnea (adult) (pediatric): Secondary | ICD-10-CM

## 2022-05-24 ENCOUNTER — Telehealth: Payer: Self-pay | Admitting: Sports Medicine

## 2022-05-24 NOTE — Telephone Encounter (Signed)
Per PT at [facility], they have attempted to contact patient once but will try again.   Left msg for patient with the number to contact [facility] directly.

## 2022-05-24 NOTE — Telephone Encounter (Signed)
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

## 2022-05-26 ENCOUNTER — Telehealth: Payer: Self-pay | Admitting: Pulmonary Disease

## 2022-05-26 DIAGNOSIS — G4733 Obstructive sleep apnea (adult) (pediatric): Secondary | ICD-10-CM | POA: Diagnosis not present

## 2022-05-26 NOTE — Telephone Encounter (Signed)
HST showed moderate  OSA with AHI 22/ hr and low saturation of 83%.  O2 signal was inaccurate between 2 AM and 6 AM so starting may not be very accurate but gives a ballpark figure. I feel that he still needs his CPAP machine. He could trial dental appliance if he really wants to give up the machine like we discussed during office visit and we can refer him to Dr. Ron Parker or Dr. Toy Cookey

## 2022-05-27 NOTE — Telephone Encounter (Signed)
Spoke with patient regarding HST results. They verbalized understanding. He would like to continue CPAP for now but was advised to reach back out to [place] if he wanted to try dental appliance.  No further questions.  Nothing further needed at this time.

## 2022-06-20 NOTE — Therapy (Signed)
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 DOB:[birth year], 66 y.o., male Today's Date: 06/22/2022  END OF SESSION:  PT End of Session - 06/21/22 1444     Visit Number 1    Number of Visits 6    Date for PT Re-Evaluation 08/02/22    Authorization Type MCR A and B    PT Start Time 3474    PT Stop Time 1439    PT Time Calculation (min) 46 min    Activity Tolerance Patient tolerated treatment well    Behavior During Therapy WFL for tasks assessed/performed             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[place])    ongoing evaluation for elevated R>L adosterone secretion   Arthritis    Atrial enlargement, bilateral    Atrial fibrillation (HCC)    paroxysmal   Atrial flutter (HCC)    Complication of anesthesia    could not urinate after UHR, needed cath, mental fog sinc cervical fusion 08-09-16    Dysrhythmia    a-fib-had ablation x2   GERD (gastroesophageal reflux disease)    Hyperaldosteronism ([place])    Hypertension    Kidney cysts    "multiple"   Nephrolithiasis    Pneumonia 1990's   Pre-diabetes    Prostatitis    Renal insufficiency    "Cr 1.6"   Past Surgical History:  Procedure Laterality Date   ABLATION     ATRIAL FIBRILLATION ABLATION N/A 09/28/2012   Procedure: ATRIAL FIBRILLATION ABLATION;  Surgeon: Thompson Grayer, MD;  Location: [facility];  Service: Cardiovascular;  Laterality: N/A;   atrial fibrillation ablation with CTI ablation  05/27/11, 09/28/12   ablation for afib and atrial flutter by Dr Norman Herrlich SURGERY  08/09/2016   C 5 6  C 6 to C 7 cervical fusion   CARDIOVERSION  07/28/2011   Procedure: CARDIOVERSION;  Surgeon: Lelon Perla, MD;  Location: [facility];  Service: Cardiovascular;  Laterality: N/A;   COLONOSCOPY WITH PROPOFOL N/A 03/03/2015   Procedure: COLONOSCOPY WITH PROPOFOL;  Surgeon: Garlan Fair, MD;  Location: [facility];  Service:  Gastroenterology;  Laterality: N/A;   double j stent placement  06/2004   left   ESOPHAGOGASTRODUODENOSCOPY (EGD) WITH PROPOFOL N/A 03/03/2015   Procedure: ESOPHAGOGASTRODUODENOSCOPY (EGD) WITH PROPOFOL;  Surgeon: Garlan Fair, MD;  Location: [facility];  Service: Gastroenterology;  Laterality: N/A;   ESOPHAGOGASTRODUODENOSCOPY (EGD) WITH PROPOFOL N/A 10/25/2016   Procedure: ESOPHAGOGASTRODUODENOSCOPY (EGD) WITH PROPOFOL;  Surgeon: Garlan Fair, MD;  Location: [facility];  Service: Endoscopy;  Laterality: N/A;   INGUINAL HERNIA REPAIR     "as a child; ? right"   INGUINAL HERNIA REPAIR Left 06/23/2016   Procedure: OPEN LEFT INGUINAL HERNIA  REPAIR WITH MESH;  Surgeon: Johnathan Hausen, MD;  Location: [facility];  Service: General;  Laterality: Left;   INSERTION OF MESH Left 06/23/2016   Procedure: INSERTION OF MESH;  Surgeon: Johnathan Hausen, MD;  Location: [facility];  Service: General;  Laterality: Left;   LEFT HEART CATH AND CORONARY ANGIOGRAPHY N/A 07/25/2018   Procedure: LEFT HEART CATH AND CORONARY ANGIOGRAPHY;  Surgeon: Belva Crome, MD;  Location: [facility];  Service: Cardiovascular;  Laterality: N/A;   LUNG BIOPSY  1993   NASAL SEPTOPLASTY W/ TURBINOPLASTY  07/2006   prostate biopsy  2011   schwannoma removal  ~  01/2010   left thorax   TEE WITHOUT CARDIOVERSION N/A 09/27/2012   Procedure: TRANSESOPHAGEAL ECHOCARDIOGRAM (TEE);  Surgeon: Larey Dresser, MD;  Location: [facility];  Service: Cardiovascular;  Laterality: N/A;   ULTRASOUND GUIDANCE FOR VASCULAR ACCESS  07/25/2018   Procedure: Ultrasound Guidance For Vascular Access;  Surgeon: Belva Crome, MD;  Location: [facility];  Service: Cardiovascular;;   UMBILICAL HERNIA REPAIR  07/2006   Patient Active Problem List   Diagnosis Date Noted   Trochanteric bursitis, left hip 02/22/2022   Moderate COPD (chronic obstructive pulmonary disease) ([place]) 03/26/2020   OSA (obstructive  sleep apnea) 03/26/2020   Pulmonary emboli ([place]) 03/14/2019   Chronic respiratory failure with hypoxia ([place]) 08/18/2018   Hyperlipidemia 08/17/2018   Community acquired pneumonia of right lower lobe of lung 08/16/2018   CKD (chronic kidney disease), stage II 08/16/2018   Abnormal ECG 07/25/2018   Incontinence of feces 04/18/2018   Urge incontinence of urine 03/11/2018   Renal insufficiency    Prostatitis    Pre-diabetes    Pneumonia    Nephrolithiasis    Kidney cysts    Hyperaldosteronism (HCC)    GERD (gastroesophageal reflux disease)    Dysrhythmia    Complication of anesthesia    Atrial enlargement, bilateral    Arthritis    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[place])    Inguinal hernia of left side without obstruction or gangrene Nov 2017 06/23/2016   Adjustment disorder with depressed mood 05/11/2016   Atrial fibrillation ([place]) 03/18/2011   Atrial flutter ([place]) 03/18/2011   Hypertension 03/18/2011     REFERRING PROVIDER: Silverio Decamp, MD  REFERRING DIAG: M70.62 (ICD-10-CM) - Trochanteric bursitis, left hip  THERAPY DIAG:  Pain in left hip  Muscle weakness (generalized)  Rationale for Evaluation and Treatment: Rehabilitation  ONSET DATE: June 2023  SUBJECTIVE:   SUBJECTIVE STATEMENT: Pt was performing a heavy set of goblet squats in June and felt a pop in his hip.  He had pain and couldn't continue with that exercise.  Pt saw MD and was found to have hip abd weakness.  Pt received exercises from MD including S/L hip aduction, lateral step ups, and standing hip openers.  Pt reports MD was going to give an injection.  Pt wanted to try PT before  receiving an injections.  Pt saw MD on 05/17/22 and ordered PT.  PT order indicated Trochanteric bursitis so needs strengthening of the hip abductors more so than stretching, may use aquatic therapy if needed.  Pt feels better   Pt works with a Physiological scientist 2x/wk.  Pt performs squats with trainer and is very sore the  2nd and 3rd day.   His L leg feels weaker than R with stairs.  Pt has difficulty getting off of the floor.  Pt does have some pain and limitation with extended walking.  He typically walks 5-6 miles per week with his wife.  Pt walks his dog and can have some limitation and fatigue.   Pt able to play pickleball without limitation.   PERTINENT HISTORY: -A-fib with a hx of ablation x 2, Hx of PE which may have led to chronic pulmonary issues -incontinence--Pt has a sacral nerve stimulator  -Paralyzed left diaphragm per pt, arthritis, pre-diabetic, cervical fusion, and hernia repairs -Pt uses bilat hearing aides  PAIN:  NPRS:  4/10 Worst, 2/10 Current, 1/10 Best Location:  L lateral hip  PRECAUTIONS: Other: sacral nerve stimulator--No E-stim; cervical fusion.  WEIGHT BEARING RESTRICTIONS: No  FALLS:  Has patient fallen in last 6 months? No  LIVING ENVIRONMENT: Lives with: lives with their spouse Lives in: 2 story home Stairs: yes.   Has following equipment at home: none  OCCUPATION: He is a retired Artist.  PLOF: Independent; Pt able to play tennis and pickleball.   PATIENT GOALS: improve strength   OBJECTIVE:   DIAGNOSTIC FINDINGS: X rays: IMPRESSION: 1. No acute fracture or dislocation. 2. Mild degenerative changes at the hips bilaterally and in the lower lumbar spine.  PATIENT SURVEYS:  FOTO 67 with a goal of 17 at visit #10  COGNITION: Overall cognitive status: Within functional limits for tasks assessed       PALPATION: TTP:  bilat GT and L>R in ITB, though minimally  LOWER EXTREMITY STRENGTH:  HHD AND MMT Right eval Left eval  Hip flexion 31.8 28.4  Hip extension 4+/5 4+/5  Hip abduction 41.8 ; 5/5 43.2 ; 4/5  Hip adduction    Hip internal rotation 5/5 5/5  Hip external rotation 5/5 5/5  Knee flexion    Knee extension 5/5 5/5  Ankle dorsiflexion    Ankle plantarflexion    Ankle inversion    Ankle eversion     (Blank rows = not tested)  LOWER  EXTREMITY ROM"  ROM Right eval Left eval  Hip flexion    Hip extension    Hip abduction    Hip adduction    Hip internal rotation 24 29 with some pain  Hip external rotation 34 29  Knee flexion    Knee extension    Ankle dorsiflexion    Ankle plantarflexion    Ankle inversion    Ankle eversion     (Blank rows = not tested)   GAIT: Assistive device utilized: None Level of assistance: Complete Independence Comments: Pt ambulated with increased Wb'ing thru R with increased lean to R and decreased stance time on L.    TODAY'S TREATMENT:                                                                                                                                  PATIENT EDUCATION:  Education details: POC, rationale of exercises, objective findings, progression of exercises, dx, and relevant anatomy.   Person educated: Patient Education method: Explanation Education comprehension: verbalized understanding  HOME EXERCISE PROGRAM: Pt has a HEP from MD currently  ASSESSMENT:  CLINICAL IMPRESSION: Patient is a 66 y.o. male with a dx of L hip trochanteric bursitis presenting to the clinic with L hip pain and L hip abductor weakness.  Pt has seen MD and received a HEP.  Pt reports he is improving.  Pt reports L leg feeling weaker than R with stairs and he does have difficulty getting off of the floor.  He enjoys walking and reports some pain and limitation with extended walking.  Pt should benefit from skilled PT services to address impairments and to improve overall function.  OBJECTIVE IMPAIRMENTS: Abnormal gait, decreased activity tolerance, decreased endurance, decreased mobility, difficulty walking, decreased strength, increased fascial restrictions, impaired flexibility, and pain.   ACTIVITY LIMITATIONS: stairs and transfers  PARTICIPATION LIMITATIONS:  walking program  PERSONAL FACTORS: 3+ comorbidities: A-fib with a hx of ablation x 2, Hx of PE which may have led to  chronic pulmonary issues, cervical fusion,arthritis   are also affecting patient's functional outcome.   REHAB POTENTIAL: Good  CLINICAL DECISION MAKING: Stable/uncomplicated  EVALUATION COMPLEXITY: Low   GOALS:   SHORT TERM GOALS: Target date: 07/12/2022  Pt will report at least a 25% improvement in pain and sx's overall.  Baseline: Goal status: INITIAL  2.  Pt will ambulate with symmetrical Wb'ing in bilat LE's and increased stance time on L.  Baseline:  Goal status: INITIAL    LONG TERM GOALS: Target date: 08/02/2022   Pt will be independent with HEP for improved pain, strength, and function.   Baseline:  Goal status: INITIAL  2.  Pt will demo improved L hip abd strength to 5/5 MMT for improved performance of functional mobility including stairs and extended ambulation.   Baseline:  Goal status: INITIAL  3.  Pt will report improved tolerance with extended ambulation and walking his dog.  Baseline:  Goal status: INITIAL  4.  Pt will report improved performance of stairs.   Baseline:  Goal status: INITIAL    PLAN:  PT FREQUENCY: 1x/week  PT DURATION: other: 4-6 weeks  PLANNED INTERVENTIONS: Therapeutic exercises, Therapeutic activity, Neuromuscular re-education, Balance training, Gait training, Patient/Family education, Self Care, Joint mobilization, Stair training, Dry Needling, Cryotherapy, Moist heat, Taping, Ultrasound, Manual therapy, and Re-evaluation  PLAN FOR NEXT SESSION:  Progress hip abductor strength.  STW to ITB.  Pt has a sacral nerve stimulator--No E-stim   Selinda Michaels III PT, DPT 06/22/22 10:43 PM

## 2022-06-21 ENCOUNTER — Encounter (HOSPITAL_BASED_OUTPATIENT_CLINIC_OR_DEPARTMENT_OTHER): Payer: Self-pay | Admitting: Physical Therapy

## 2022-06-21 ENCOUNTER — Other Ambulatory Visit: Payer: Self-pay

## 2022-06-21 ENCOUNTER — Ambulatory Visit (HOSPITAL_BASED_OUTPATIENT_CLINIC_OR_DEPARTMENT_OTHER): Payer: Medicare Other | Attending: Sports Medicine | Admitting: Physical Therapy

## 2022-06-21 DIAGNOSIS — M6281 Muscle weakness (generalized): Secondary | ICD-10-CM | POA: Diagnosis present

## 2022-06-21 DIAGNOSIS — M7062 Trochanteric bursitis, left hip: Secondary | ICD-10-CM | POA: Insufficient documentation

## 2022-06-21 DIAGNOSIS — M25552 Pain in left hip: Secondary | ICD-10-CM | POA: Insufficient documentation

## 2022-07-01 ENCOUNTER — Encounter (HOSPITAL_BASED_OUTPATIENT_CLINIC_OR_DEPARTMENT_OTHER): Payer: Self-pay | Admitting: Physical Therapy

## 2022-07-01 ENCOUNTER — Ambulatory Visit (HOSPITAL_BASED_OUTPATIENT_CLINIC_OR_DEPARTMENT_OTHER): Payer: Medicare Other | Attending: Sports Medicine | Admitting: Physical Therapy

## 2022-07-01 DIAGNOSIS — M25552 Pain in left hip: Secondary | ICD-10-CM | POA: Diagnosis present

## 2022-07-01 DIAGNOSIS — M6281 Muscle weakness (generalized): Secondary | ICD-10-CM | POA: Diagnosis present

## 2022-07-01 NOTE — Therapy (Signed)
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[place])    ongoing evaluation for elevated R>L adosterone secretion   Arthritis    Atrial enlargement, bilateral    Atrial fibrillation ([facility])    paroxysmal   Atrial flutter ([facility])    Complication of anesthesia    could not urinate after UHR, needed cath, mental fog sinc cervical fusion 08-09-16    Dysrhythmia    a-fib-had ablation x2   GERD (gastroesophageal reflux disease)    Hyperaldosteronism ([place])    Hypertension    Kidney cysts    "multiple"   Nephrolithiasis    Pneumonia 1990's   Pre-diabetes    Prostatitis    Renal insufficiency    "Cr 1.6"   Past Surgical History:  Procedure Laterality Date   ABLATION     ATRIAL FIBRILLATION ABLATION N/A 09/28/2012   Procedure: ATRIAL FIBRILLATION ABLATION;  Surgeon: Thompson Grayer, MD;  Location: [facility];  Service: Cardiovascular;  Laterality: N/A;   atrial fibrillation ablation with CTI ablation  05/27/11, 09/28/12   ablation for afib and atrial flutter by Dr Norman Herrlich SURGERY  08/09/2016   C 5 6  C 6 to C 7 cervical fusion   CARDIOVERSION  07/28/2011   Procedure: CARDIOVERSION;  Surgeon: Lelon Perla, MD;  Location: [facility];  Service: Cardiovascular;  Laterality: N/A;   COLONOSCOPY WITH PROPOFOL N/A 03/03/2015   Procedure: COLONOSCOPY WITH PROPOFOL;  Surgeon: Garlan Fair, MD;  Location: [facility];  Service:  Gastroenterology;  Laterality: N/A;   double j stent placement  06/2004   left   ESOPHAGOGASTRODUODENOSCOPY (EGD) WITH PROPOFOL N/A 03/03/2015   Procedure: ESOPHAGOGASTRODUODENOSCOPY (EGD) WITH PROPOFOL;  Surgeon: Garlan Fair, MD;  [facility];  Service: Gastroenterology;  Laterality: N/A;   ESOPHAGOGASTRODUODENOSCOPY (EGD) WITH PROPOFOL N/A 10/25/2016   Procedure: ESOPHAGOGASTRODUODENOSCOPY (EGD) WITH PROPOFOL;  Surgeon: Garlan Fair, MD;  Location[facility];  Service: Endoscopy;  Laterality: N/A;   INGUINAL HERNIA REPAIR     "as a child; ? right"   INGUINAL HERNIA REPAIR Left 06/23/2016   Procedure: OPEN LEFT INGUINAL HERNIA  REPAIR WITH MESH;  Surgeon: Johnathan Hausen, MD;  Location: [facility];  Service: General;  Laterality: Left;   INSERTION OF MESH Left 06/23/2016   Procedure: INSERTION OF MESH;  Surgeon: Johnathan Hausen, MD;  Location: [facility];  Service: General;  Laterality: Left;   LEFT HEART CATH AND CORONARY ANGIOGRAPHY N/A 07/25/2018   Procedure: LEFT HEART CATH AND CORONARY ANGIOGRAPHY;  Surgeon: Belva Crome, MD;  Location: [facility];  Service: Cardiovascular;  Laterality: N/A;   LUNG BIOPSY  1993   NASAL SEPTOPLASTY W/ TURBINOPLASTY  07/2006   prostate biopsy  2011   schwannoma removal  ~  01/2010   left thorax   TEE WITHOUT CARDIOVERSION N/A 09/27/2012   Procedure: TRANSESOPHAGEAL ECHOCARDIOGRAM (TEE);  Surgeon: Larey Dresser, MD;  Location: [facility];  Service: Cardiovascular;  Laterality: N/A;   ULTRASOUND GUIDANCE FOR VASCULAR ACCESS  07/25/2018   Procedure: Ultrasound Guidance For Vascular Access;  Surgeon: Belva Crome, MD;  Location: [facility];  Service: Cardiovascular;;   UMBILICAL HERNIA REPAIR  07/2006   Patient Active Problem List   Diagnosis Date Noted   Trochanteric bursitis, left hip 02/22/2022   Moderate COPD (chronic obstructive pulmonary disease) ([place]) 03/26/2020   OSA (obstructive  sleep apnea) 03/26/2020   Pulmonary emboli ([facility]) 03/14/2019   Chronic respiratory failure with hypoxia ([place]) 08/18/2018   Hyperlipidemia 08/17/2018   Community acquired pneumonia of right lower lobe of lung 08/16/2018   CKD (chronic kidney disease), stage II 08/16/2018   Abnormal ECG 07/25/2018   Incontinence of feces 04/18/2018   Urge incontinence of urine 03/11/2018   Renal insufficiency    Prostatitis    Pre-diabetes    Pneumonia    Nephrolithiasis    Kidney cysts    Hyperaldosteronism (HCC)    GERD (gastroesophageal reflux disease)    Dysrhythmia    Complication of anesthesia    Atrial enlargement, bilateral    Arthritis    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[place])    Inguinal hernia of left side without obstruction or gangrene Nov 2017 06/23/2016   Adjustment disorder with depressed mood 05/11/2016   Atrial fibrillation ([place]) 03/18/2011   Atrial flutter ([place]) 03/18/2011   Hypertension 03/18/2011     REFERRING PROVIDER: Silverio Decamp, MD  REFERRING DIAG: M70.62 (ICD-10-CM) - Trochanteric bursitis, left hip  THERAPY DIAG:  Pain in left hip  Muscle weakness (generalized)  Rationale for Evaluation and Treatment: Rehabilitation  ONSET DATE: June 2023  SUBJECTIVE:   SUBJECTIVE STATEMENT:  I got my covid shot last Wednesday it knocked me for a loop, haven't worked out much since but I see one Financial risk analyst. I get sore from those workouts. Usually I'm limited more by lung capacity than anything, A-fib is under control.   PERTINENT HISTORY: -A-fib with a hx of ablation x 2, Hx of PE which may have led to chronic pulmonary issues -incontinence--Pt has a sacral nerve stimulator  -Paralyzed left diaphragm per pt, arthritis, pre-diabetic, cervical fusion, and hernia repairs -Pt uses bilat hearing aides  PAIN:  NPRS:  0/10 Location:  L lateral hip Aggravating factors: can be surprising, can be a little random depends on the movement  Relieving factors: not  doing aggravating movements Description: tightness   PRECAUTIONS: Other: sacral nerve stimulator--No E-stim; cervical fusion.  WEIGHT BEARING RESTRICTIONS: No  FALLS:  Has patient fallen in last 6 months? No  LIVING ENVIRONMENT: Lives with: lives with their spouse Lives in: 2 story home Stairs: yes.   Has following equipment at home: none  OCCUPATION: He is a retired Artist.  PLOF: Independent; Pt able to play tennis and pickleball.   PATIENT GOALS: improve strength   OBJECTIVE:   DIAGNOSTIC FINDINGS: X rays: IMPRESSION: 1. No acute fracture or dislocation. 2. Mild degenerative changes at the hips bilaterally and in the lower lumbar spine.  PATIENT SURVEYS:  FOTO 67 with a goal of 59 at visit #10  COGNITION: Overall cognitive status: Within functional limits for tasks assessed       PALPATION: TTP:  bilat GT and L>R in ITB, though minimally  LOWER EXTREMITY STRENGTH:  HHD AND MMT Right  eval Left eval  Hip flexion 31.8 28.4  Hip extension 4+/5 4+/5  Hip abduction 41.8 ; 5/5 43.2 ; 4/5  Hip adduction    Hip internal rotation 5/5 5/5  Hip external rotation 5/5 5/5  Knee flexion    Knee extension 5/5 5/5  Ankle dorsiflexion    Ankle plantarflexion    Ankle inversion    Ankle eversion     (Blank rows = not tested)  LOWER EXTREMITY ROM"  ROM Right eval Left eval  Hip flexion    Hip extension    Hip abduction    Hip adduction    Hip internal rotation 24 29 with some pain  Hip external rotation 34 29  Knee flexion    Knee extension    Ankle dorsiflexion    Ankle plantarflexion    Ankle inversion    Ankle eversion     (Blank rows = not tested)   GAIT: Assistive device utilized: None Level of assistance: Complete Independence Comments: Pt ambulated with increased Wb'ing thru R with increased lean to R and decreased stance time on L.    TODAY'S TREATMENT:            07/01/22  TherEx  Nustep L6x6 minutes BLEs only  Sidelying hip ABD x15  mod  Sidelying hip circles in hip ABD x10 forward/x10 backward B Sidelying hip hikes + swing x10 B Hip hikes x10 B sidelying   Manual  STM with rolling pain to L lateral thigh and TFL region                                                                                                                         PATIENT EDUCATION:  Education details: moist heat and self tennis ball massage to lateral L thigh for pain management  Person educated: Patient Education method: Explanation Education comprehension: verbalized understanding  HOME EXERCISE PROGRAM: Pt has a HEP from MD currently  ASSESSMENT:  CLINICAL IMPRESSION:  Osvaldo arrives today doing well, didn't exercise much last week after feeling poorly after covid shot but getting back into routine now. Focused on hip ABD strength per POC, needed a lot of manual facilitation for good form as he tends to compensate to using hip flexors quite a bit/avoided using hip abductors quite often.  Otherwise performed STM to L lateral thigh and TFL region. Did not update HEP beyond tennis ball self massage/moist heat due to poor form. Will continue to progress as able.    OBJECTIVE IMPAIRMENTS: Abnormal gait, decreased activity tolerance, decreased endurance, decreased mobility, difficulty walking, decreased strength, increased fascial restrictions, impaired flexibility, and pain.   ACTIVITY LIMITATIONS: stairs and transfers  PARTICIPATION LIMITATIONS:  walking program  PERSONAL FACTORS: 3+ comorbidities: A-fib with a hx of ablation x 2, Hx of PE which may have led to chronic pulmonary issues, cervical fusion,arthritis   are also affecting patient's functional outcome.   REHAB POTENTIAL: Good  CLINICAL DECISION MAKING: Stable/uncomplicated  EVALUATION COMPLEXITY: Low  GOALS:   SHORT TERM GOALS: Target date: 07/12/2022  Pt will report at least a 25% improvement in pain and sx's overall.  Baseline: Goal status: INITIAL  2.  Pt  will ambulate with symmetrical Wb'ing in bilat LE's and increased stance time on L.  Baseline:  Goal status: INITIAL    LONG TERM GOALS: Target date: 08/02/2022   Pt will be independent with HEP for improved pain, strength, and function.   Baseline:  Goal status: INITIAL  2.  Pt will demo improved L hip abd strength to 5/5 MMT for improved performance of functional mobility including stairs and extended ambulation.   Baseline:  Goal status: INITIAL  3.  Pt will report improved tolerance with extended ambulation and walking his dog.  Baseline:  Goal status: INITIAL  4.  Pt will report improved performance of stairs.   Baseline:  Goal status: INITIAL    PLAN:  PT FREQUENCY: 1x/week  PT DURATION: other: 4-6 weeks  PLANNED INTERVENTIONS: Therapeutic exercises, Therapeutic activity, Neuromuscular re-education, Balance training, Gait training, Patient/Family education, Self Care, Joint mobilization, Stair training, Dry Needling, Cryotherapy, Moist heat, Taping, Ultrasound, Manual therapy, and Re-evaluation  PLAN FOR NEXT SESSION:  Progress hip abductor strength.  STW to ITB.  Pt has a sacral nerve stimulator--No E-stim   Christie Copley U PT DPT PN2  07/01/2022, 10:13 AM

## 2022-07-09 ENCOUNTER — Ambulatory Visit (HOSPITAL_BASED_OUTPATIENT_CLINIC_OR_DEPARTMENT_OTHER): Payer: Medicare Other | Admitting: Physical Therapy

## 2022-07-09 ENCOUNTER — Encounter (HOSPITAL_BASED_OUTPATIENT_CLINIC_OR_DEPARTMENT_OTHER): Payer: Self-pay | Admitting: Physical Therapy

## 2022-07-09 DIAGNOSIS — M25552 Pain in left hip: Secondary | ICD-10-CM

## 2022-07-09 DIAGNOSIS — M6281 Muscle weakness (generalized): Secondary | ICD-10-CM

## 2022-07-09 NOTE — Therapy (Unsigned)
OUTPATIENT PHYSICAL THERAPY LOWER EXTREMITY TREATMENT   Patient Name: Casey RYBICKI, MD MRN: [id number] DOB:[birth year], 66 y.o., male Today's Date: 07/10/2022  END OF SESSION:  PT End of Session - 07/09/22 0900     Visit Number 3    Number of Visits 6    Date for PT Re-Evaluation 08/02/22    Authorization Type MCR A and B    PT Start Time 0855    PT Stop Time 0933    PT Time Calculation (min) 38 min    Activity Tolerance Patient tolerated treatment well    Behavior During Therapy WFL for tasks assessed/performed              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[place])    ongoing evaluation for elevated R>L adosterone secretion   Arthritis    Atrial enlargement, bilateral    Atrial fibrillation (HCC)    paroxysmal   Atrial flutter (HCC)    Complication of anesthesia    could not urinate after UHR, needed cath, mental fog sinc cervical fusion 08-09-16    Dysrhythmia    a-fib-had ablation x2   GERD (gastroesophageal reflux disease)    Hyperaldosteronism ([place])    Hypertension    Kidney cysts    "multiple"   Nephrolithiasis    Pneumonia 1990's   Pre-diabetes    Prostatitis    Renal insufficiency    "Cr 1.6"   Past Surgical History:  Procedure Laterality Date   ABLATION     ATRIAL FIBRILLATION ABLATION N/A 09/28/2012   Procedure: ATRIAL FIBRILLATION ABLATION;  Surgeon: [place] Grayer, MD;  Location: [facility];  Service: Cardiovascular;  Laterality: N/A;   atrial fibrillation ablation with CTI ablation  05/27/11, 09/28/12   ablation for afib and atrial flutter by Dr Norman Herrlich SURGERY  08/09/2016   C 5 6  C 6 to C 7 cervical fusion   CARDIOVERSION  07/28/2011   Procedure: CARDIOVERSION;  Surgeon: Lelon Perla, MD;  Location: [facility];  Service: Cardiovascular;  Laterality: N/A;   COLONOSCOPY WITH PROPOFOL N/A 03/03/2015   Procedure: COLONOSCOPY WITH PROPOFOL;  Surgeon: Garlan Fair, MD;  Location: [facility];  Service:  Gastroenterology;  Laterality: N/A;   double j stent placement  06/2004   left   ESOPHAGOGASTRODUODENOSCOPY (EGD) WITH PROPOFOL N/A 03/03/2015   Procedure: ESOPHAGOGASTRODUODENOSCOPY (EGD) WITH PROPOFOL;  Surgeon: Garlan Fair, MD;  Location: [facility];  Service: Gastroenterology;  Laterality: N/A;   ESOPHAGOGASTRODUODENOSCOPY (EGD) WITH PROPOFOL N/A 10/25/2016   Procedure: ESOPHAGOGASTRODUODENOSCOPY (EGD) WITH PROPOFOL;  Surgeon: Garlan Fair, MD;  Location: [facility];  Service: Endoscopy;  Laterality: N/A;   INGUINAL HERNIA REPAIR     "as a child; ? right"   INGUINAL HERNIA REPAIR Left 06/23/2016   Procedure: OPEN LEFT INGUINAL HERNIA  REPAIR WITH MESH;  Surgeon: Johnathan Hausen, MD;  Location: [facility];  Service: General;  Laterality: Left;   INSERTION OF MESH Left 06/23/2016   Procedure: INSERTION OF MESH;  Surgeon: Johnathan Hausen, MD;  Location: [facility];  Service: General;  Laterality: Left;   LEFT HEART CATH AND CORONARY ANGIOGRAPHY N/A 07/25/2018   Procedure: LEFT HEART CATH AND CORONARY ANGIOGRAPHY;  Surgeon: Belva Crome, MD;  Location: [facility];  Service: Cardiovascular;  Laterality: N/A;   LUNG BIOPSY  1993   NASAL SEPTOPLASTY W/ TURBINOPLASTY  07/2006   prostate biopsy  2011   schwannoma removal  ~  01/2010   left thorax   TEE WITHOUT CARDIOVERSION N/A 09/27/2012   Procedure: TRANSESOPHAGEAL ECHOCARDIOGRAM (TEE);  Surgeon: Larey Dresser, MD;  Location: [facility];  Service: Cardiovascular;  Laterality: N/A;   ULTRASOUND GUIDANCE FOR VASCULAR ACCESS  07/25/2018   Procedure: Ultrasound Guidance For Vascular Access;  Surgeon: Belva Crome, MD;  Location: [facility];  Service: Cardiovascular;;   UMBILICAL HERNIA REPAIR  07/2006   Patient Active Problem List   Diagnosis Date Noted   Trochanteric bursitis, left hip 02/22/2022   Moderate COPD (chronic obstructive pulmonary disease) ([place]) 03/26/2020   OSA (obstructive  sleep apnea) 03/26/2020   Pulmonary emboli ([place]) 03/14/2019   Chronic respiratory failure with hypoxia ([place]) 08/18/2018   Hyperlipidemia 08/17/2018   Community acquired pneumonia of right lower lobe of lung 08/16/2018   CKD (chronic kidney disease), stage II 08/16/2018   Abnormal ECG 07/25/2018   Incontinence of feces 04/18/2018   Urge incontinence of urine 03/11/2018   Renal insufficiency    Prostatitis    Pre-diabetes    Pneumonia    Nephrolithiasis    Kidney cysts    Hyperaldosteronism (HCC)    GERD (gastroesophageal reflux disease)    Dysrhythmia    Complication of anesthesia    Atrial enlargement, bilateral    Arthritis    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[place])    Inguinal hernia of left side without obstruction or gangrene Nov 2017 06/23/2016   Adjustment disorder with depressed mood 05/11/2016   Atrial fibrillation ([place]) 03/18/2011   Atrial flutter ([place]) 03/18/2011   Hypertension 03/18/2011     REFERRING PROVIDER: Silverio Decamp, MD  REFERRING DIAG: M70.62 (ICD-10-CM) - Trochanteric bursitis, left hip  THERAPY DIAG:  Pain in left hip  Muscle weakness (generalized)  Rationale for Evaluation and Treatment: Rehabilitation  ONSET DATE: June 2023  SUBJECTIVE:   SUBJECTIVE STATEMENT:  Pt states he was sore after prior Rx and had no increased pain.  Pt states he hasn't done a whole lot this week due to being in [place].  He has done some of his exercises.    PERTINENT HISTORY: -A-fib with a hx of ablation x 2, Hx of PE which may have led to chronic pulmonary issues -incontinence--Pt has a sacral nerve stimulator  -Paralyzed left diaphragm per pt, arthritis, pre-diabetic, cervical fusion, and hernia repairs -Pt uses bilat hearing aides  PAIN:  NPRS:  1/10 Location:  L lateral hip Aggravating factors: can be surprising, can be a little random depends on the movement  Relieving factors: not doing aggravating movements Description: tightness   PRECAUTIONS:  Other: sacral nerve stimulator--No E-stim; cervical fusion.  WEIGHT BEARING RESTRICTIONS: No  FALLS:  Has patient fallen in last 6 months? No  LIVING ENVIRONMENT: Lives with: lives with their spouse Lives in: 2 story home Stairs: yes.   Has following equipment at home: none  OCCUPATION: He is a retired Artist.  PLOF: Independent; Pt able to play tennis and pickleball.   PATIENT GOALS: improve strength   OBJECTIVE:   DIAGNOSTIC FINDINGS: X rays: IMPRESSION: 1. No acute fracture or dislocation. 2. Mild degenerative changes at the hips bilaterally and in the lower lumbar spine.   TODAY'S TREATMENT:              TherEx  Nustep L6x6 minutes BLEs only  Sidelying hip ABD x15 AROM and 2x10 with 2#  Sidelying hip circles in hip ABD x10 forward/x10 backward B x 2 sets S/L clams with RTB 2x10 L Hip hikes  2x10 B in standing Lateral band walks with RTB above thighs x 3 laps   Manual  Pt received STM with rolling with roller to L glute and ITB in R S/L'ing with pillow b/w knees to improve soft tissue tightness, mobility, and pain.                                                                                                                        PATIENT EDUCATION:  Education details: exercise form, exercise rationale, POC, and HEP.   Person educated: Patient Education method: Explanation, demonstration, verbal cues, and tactile cues Education comprehension: verbalized understanding, returned demonstration, verbal and tactile cues required  HOME EXERCISE PROGRAM: Pt has a HEP from MD currently  ASSESSMENT:  CLINICAL IMPRESSION: PT progressed exercises with adding resistance to select exercises and progressing hip hikes to standing.  Pt has decreased control with hip abd circles and requires much cuing for correct form.  Pt tolerates STW well.  Pt responded well to Rx reporting no increased pain after Rx.  He should benefit from cont skilled PT services to address ongoing  goals and to restore PLOF.     OBJECTIVE IMPAIRMENTS: Abnormal gait, decreased activity tolerance, decreased endurance, decreased mobility, difficulty walking, decreased strength, increased fascial restrictions, impaired flexibility, and pain.   ACTIVITY LIMITATIONS: stairs and transfers  PARTICIPATION LIMITATIONS:  walking program  PERSONAL FACTORS: 3+ comorbidities: A-fib with a hx of ablation x 2, Hx of PE which may have led to chronic pulmonary issues, cervical fusion,arthritis   are also affecting patient's functional outcome.   REHAB POTENTIAL: Good  CLINICAL DECISION MAKING: Stable/uncomplicated  EVALUATION COMPLEXITY: Low   GOALS:   SHORT TERM GOALS: Target date: 07/12/2022  Pt will report at least a 25% improvement in pain and sx's overall.  Baseline: Goal status: INITIAL  2.  Pt will ambulate with symmetrical Wb'ing in bilat LE's and increased stance time on L.  Baseline:  Goal status: INITIAL    LONG TERM GOALS: Target date: 08/02/2022   Pt will be independent with HEP for improved pain, strength, and function.   Baseline:  Goal status: INITIAL  2.  Pt will demo improved L hip abd strength to 5/5 MMT for improved performance of functional mobility including stairs and extended ambulation.   Baseline:  Goal status: INITIAL  3.  Pt will report improved tolerance with extended ambulation and walking his dog.  Baseline:  Goal status: INITIAL  4.  Pt will report improved performance of stairs.   Baseline:  Goal status: INITIAL    PLAN:  PT FREQUENCY: 1x/week  PT DURATION: other: 4-6 weeks  PLANNED INTERVENTIONS: Therapeutic exercises, Therapeutic activity, Neuromuscular re-education, Balance training, Gait training, Patient/Family education, Self Care, Joint mobilization, Stair training, Dry Needling, Cryotherapy, Moist heat, Taping, Ultrasound, Manual therapy, and Re-evaluation  PLAN FOR NEXT SESSION:  Progress hip abductor strength.  STW to ITB.   Update HEP.  Pt has a sacral nerve stimulator--No E-stim   Selinda Michaels  III PT, DPT 07/10/22 6:37 AM

## 2022-07-15 ENCOUNTER — Encounter (HOSPITAL_BASED_OUTPATIENT_CLINIC_OR_DEPARTMENT_OTHER): Payer: Self-pay | Admitting: Physical Therapy

## 2022-07-15 ENCOUNTER — Ambulatory Visit (HOSPITAL_BASED_OUTPATIENT_CLINIC_OR_DEPARTMENT_OTHER): Payer: Medicare Other | Admitting: Physical Therapy

## 2022-07-15 DIAGNOSIS — M25552 Pain in left hip: Secondary | ICD-10-CM

## 2022-07-15 DIAGNOSIS — M6281 Muscle weakness (generalized): Secondary | ICD-10-CM

## 2022-07-15 NOTE — Therapy (Signed)
OUTPATIENT PHYSICAL THERAPY LOWER EXTREMITY TREATMENT   Patient Name: Casey BLANKENHORN, MD MRN: [id number] DOB:01-Jan-1956, 66 y.o., male Today's Date: 07/15/2022  END OF SESSION:  PT End of Session - 07/15/22 1408     Visit Number 4    Number of Visits 6    Date for PT Re-Evaluation 08/02/22    Authorization Type MCR A and B    PT Start Time 0930    PT Stop Time 1012    PT Time Calculation (min) 42 min    Activity Tolerance Patient tolerated treatment well    Behavior During Therapy WFL for tasks assessed/performed               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[place])    ongoing evaluation for elevated R>L adosterone secretion   Arthritis    Atrial enlargement, bilateral    Atrial fibrillation (H[facility])    paroxysmal   Atrial flutter ([facility])    Complication of anesthesia    could not urinate after UHR, needed cath, mental fog sinc cervical fusion 08-09-16    Dysrhythmia    a-fib-had ablation x2   GERD (gastroesophageal reflux disease)    Hyperaldosteronism ([place])    Hypertension    Kidney cysts    "multiple"   Nephrolithiasis    Pneumonia 1990's   Pre-diabetes    Prostatitis    Renal insufficiency    "Cr 1.6"   Past Surgical History:  Procedure Laterality Date   ABLATION     ATRIAL FIBRILLATION ABLATION N/A 09/28/2012   Procedure: ATRIAL FIBRILLATION ABLATION;  Surgeon: [place] Grayer, MD;  Location: [facility];  Service: Cardiovascular;  Laterality: N/A;   atrial fibrillation ablation with CTI ablation  05/27/11, 09/28/12   ablation for afib and atrial flutter by Dr Norman Herrlich SURGERY  08/09/2016   C 5 6  C 6 to C 7 cervical fusion   CARDIOVERSION  07/28/2011   Procedure: CARDIOVERSION;  Surgeon: Lelon Perla, MD;  Location: [facility];  Service: Cardiovascular;  Laterality: N/A;   COLONOSCOPY WITH PROPOFOL N/A 03/03/2015   Procedure: COLONOSCOPY WITH PROPOFOL;  Surgeon: Garlan Fair, MD;  Location: [facility];  Service:  Gastroenterology;  Laterality: N/A;   double j stent placement  06/2004   left   ESOPHAGOGASTRODUODENOSCOPY (EGD) WITH PROPOFOL N/A 03/03/2015   Procedure: ESOPHAGOGASTRODUODENOSCOPY (EGD) WITH PROPOFOL;  Surgeon: Garlan Fair, MD;  [facility];  Service: Gastroenterology;  Laterality: N/A;   ESOPHAGOGASTRODUODENOSCOPY (EGD) WITH PROPOFOL N/A 10/25/2016   Procedure: ESOPHAGOGASTRODUODENOSCOPY (EGD) WITH PROPOFOL;  Surgeon: Garlan Fair, MD;  [facility];  Service: Endoscopy;  Laterality: N/A;   INGUINAL HERNIA REPAIR     "as a child; ? right"   INGUINAL HERNIA REPAIR Left 06/23/2016   Procedure: OPEN LEFT INGUINAL HERNIA  REPAIR WITH MESH;  Surgeon: Johnathan Hausen, MD;  Location: [facility];  Service: General;  Laterality: Left;   INSERTION OF MESH Left 06/23/2016   Procedure: INSERTION OF MESH;  Surgeon: Johnathan Hausen, MD;  Location: [facility];  Service: General;  Laterality: Left;   LEFT HEART CATH AND CORONARY ANGIOGRAPHY N/A 07/25/2018   Procedure: LEFT HEART CATH AND CORONARY ANGIOGRAPHY;  Surgeon: Belva Crome, MD;  Location: [facility];  Service: Cardiovascular;  Laterality: N/A;   LUNG BIOPSY  1993   NASAL SEPTOPLASTY W/ TURBINOPLASTY  07/2006   prostate biopsy  2011   schwannoma  removal  ~01/2010   left thorax   TEE WITHOUT CARDIOVERSION N/A 09/27/2012   Procedure: TRANSESOPHAGEAL ECHOCARDIOGRAM (TEE);  Surgeon: Larey Dresser, MD;  Location: [facility];  Service: Cardiovascular;  Laterality: N/A;   ULTRASOUND GUIDANCE FOR VASCULAR ACCESS  07/25/2018   Procedure: Ultrasound Guidance For Vascular Access;  Surgeon: Belva Crome, MD;  Location: [facility];  Service: Cardiovascular;;   UMBILICAL HERNIA REPAIR  07/2006   Patient Active Problem List   Diagnosis Date Noted   Trochanteric bursitis, left hip 02/22/2022   Moderate COPD (chronic obstructive pulmonary disease) ([place]) 03/26/2020   OSA (obstructive  sleep apnea) 03/26/2020   Pulmonary emboli ([facility]) 03/14/2019   Chronic respiratory failure with hypoxia ([place]) 08/18/2018   Hyperlipidemia 08/17/2018   Community acquired pneumonia of right lower lobe of lung 08/16/2018   CKD (chronic kidney disease), stage II 08/16/2018   Abnormal ECG 07/25/2018   Incontinence of feces 04/18/2018   Urge incontinence of urine 03/11/2018   Renal insufficiency    Prostatitis    Pre-diabetes    Pneumonia    Nephrolithiasis    Kidney cysts    Hyperaldosteronism (HCC)    GERD (gastroesophageal reflux disease)    Dysrhythmia    Complication of anesthesia    Atrial enlargement, bilateral    Arthritis    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[place])    Inguinal hernia of left side without obstruction or gangrene Nov 2017 06/23/2016   Adjustment disorder with depressed mood 05/11/2016   Atrial fibrillation ([place]) 03/18/2011   Atrial flutter ([place]) 03/18/2011   Hypertension 03/18/2011     REFERRING PROVIDER: Silverio Decamp, MD  REFERRING DIAG: M70.62 (ICD-10-CM) - Trochanteric bursitis, left hip  THERAPY DIAG:  Pain in left hip  Muscle weakness (generalized)  Rationale for Evaluation and Treatment: Rehabilitation  ONSET DATE: June 2023  SUBJECTIVE:   SUBJECTIVE STATEMENT: Patient reports his pain comes and goes. His exercises have been working OK when he does them.   PERTINENT HISTORY: -A-fib with a hx of ablation x 2, Hx of PE which may have led to chronic pulmonary issues -incontinence--Pt has a sacral nerve stimulator  -Paralyzed left diaphragm per pt, arthritis, pre-diabetic, cervical fusion, and hernia repairs -Pt uses bilat hearing aides  PAIN:  NPRS:  1/10 Location:  L lateral hip Aggravating factors: can be surprising, can be a little random depends on the movement  Relieving factors: not doing aggravating movements Description: tightness   PRECAUTIONS: Other: sacral nerve stimulator--No E-stim; cervical fusion.  WEIGHT BEARING  RESTRICTIONS: No  FALLS:  Has patient fallen in last 6 months? No  LIVING ENVIRONMENT: Lives with: lives with their spouse Lives in: 2 story home Stairs: yes.   Has following equipment at home: none  OCCUPATION: He is a retired Artist.  PLOF: Independent; Pt able to play tennis and pickleball.   PATIENT GOALS: improve strength   OBJECTIVE:   DIAGNOSTIC FINDINGS: X rays: IMPRESSION: 1. No acute fracture or dislocation. 2. Mild degenerative changes at the hips bilaterally and in the lower lumbar spine.   TODAY'S TREATMENT:           12/21 Nustep L6x6 minutes BLEs only  Lateral band walks with RTB above thighs x 3 laps Supine kick out 2x15  Supine clamshells green band 2x15  Gluteal stretch 3x20 sec hold   Trigger Point Dry-Needling  Treatment instructions: Expect mild to moderate muscle soreness. S/S of pneumothorax if dry needled over a lung field, and to seek immediate medical  attention should they occur. Patient verbalized understanding of these instructions and education.  Patient Consent Given: Yes Education handout provided: Yes Muscles treated: right gluteal 3 spots with care to stay lateral of bladder stimulator in 4 spots  Electrical stimulation performed: No Parameters:  Treatment response/outcome: good twttch on 2/4 needles   Manual: Trigger point dry needling to gluteal and lower lumbar spine    Eval  TherEx  Nustep L6x6 minutes BLEs only  Sidelying hip ABD x15 AROM and 2x10 with 2#  Sidelying hip circles in hip ABD x10 forward/x10 backward B x 2 sets S/L clams with RTB 2x10 L Hip hikes 2x10 B in standing Lateral band walks with RTB above thighs x 3 laps   Manual  Pt received STM with rolling with roller to L glute and ITB in R S/L'ing with pillow b/w knees to improve soft tissue tightness, mobility, and pain.                                                                                                                        PATIENT  EDUCATION:  Education details: exercise form, exercise rationale, POC, and HEP.   Person educated: Patient Education method: Explanation, demonstration, verbal cues, and tactile cues Education comprehension: verbalized understanding, returned demonstration, verbal and tactile cues required  HOME EXERCISE PROGRAM: Pt has a HEP from [facility] currently  ASSESSMENT:  CLINICAL IMPRESSION: Patient reports mild to moderate twitch response with trigger point dry needling of glutamine area.  He reported no residual soreness following treatment.  We also performed trigger point release to the area.  We reviewed exercises that we will work his gluteal but also reduce postnasal soreness.  We reviewed and updated HEP.  Therapy will continue to progress as tolerated  OBJECTIVE IMPAIRMENTS: Abnormal gait, decreased activity tolerance, decreased endurance, decreased mobility, difficulty walking, decreased strength, increased fascial restrictions, impaired flexibility, and pain.   ACTIVITY LIMITATIONS: stairs and transfers  PARTICIPATION LIMITATIONS:  walking program  PERSONAL FACTORS: 3+ comorbidities: A-fib with a hx of ablation x 2, Hx of PE which may have led to chronic pulmonary issues, cervical fusion,arthritis   are also affecting patient's functional outcome.   REHAB POTENTIAL: Good  CLINICAL DECISION MAKING: Stable/uncomplicated  EVALUATION COMPLEXITY: Low   GOALS:   SHORT TERM GOALS: Target date: 07/12/2022  Pt will report at least a 25% improvement in pain and sx's overall.  Baseline: Goal status: INITIAL  2.  Pt will ambulate with symmetrical Wb'ing in bilat LE's and increased stance time on L.  Baseline:  Goal status: INITIAL    LONG TERM GOALS: Target date: 08/02/2022   Pt will be independent with HEP for improved pain, strength, and function.   Baseline:  Goal status: INITIAL  2.  Pt will demo improved L hip abd strength to 5/5 MMT for improved performance of functional  mobility including stairs and extended ambulation.   Baseline:  Goal status: INITIAL  3.  Pt will report improved tolerance with extended  ambulation and walking his dog.  Baseline:  Goal status: INITIAL  4.  Pt will report improved performance of stairs.   Baseline:  Goal status: INITIAL    PLAN:  PT FREQUENCY: 1x/week  PT DURATION: other: 4-6 weeks  PLANNED INTERVENTIONS: Therapeutic exercises, Therapeutic activity, Neuromuscular re-education, Balance training, Gait training, Patient/Family education, Self Care, Joint mobilization, Stair training, Dry Needling, Cryotherapy, Moist heat, Taping, Ultrasound, Manual therapy, and Re-evaluation  PLAN FOR NEXT SESSION:  Progress hip abductor strength.  STW to ITB.  Update HEP.  Pt has a sacral nerve stimulator--No E-stim   Carolyne Littles PT DPT  07/15/22 7:14 PM

## 2022-07-22 ENCOUNTER — Ambulatory Visit (HOSPITAL_BASED_OUTPATIENT_CLINIC_OR_DEPARTMENT_OTHER): Payer: Medicare Other | Admitting: Physical Therapy

## 2022-07-29 ENCOUNTER — Ambulatory Visit (HOSPITAL_BASED_OUTPATIENT_CLINIC_OR_DEPARTMENT_OTHER): Payer: Medicare Other | Admitting: Physical Therapy

## 2022-07-29 ENCOUNTER — Encounter: Payer: Medicare Other | Attending: Psychology | Admitting: Psychology

## 2022-07-29 DIAGNOSIS — J449 Chronic obstructive pulmonary disease, unspecified: Secondary | ICD-10-CM

## 2022-07-29 DIAGNOSIS — I63139 Cerebral infarction due to embolism of unspecified carotid artery: Secondary | ICD-10-CM

## 2022-07-29 DIAGNOSIS — M6281 Muscle weakness (generalized): Secondary | ICD-10-CM | POA: Insufficient documentation

## 2022-07-29 DIAGNOSIS — M25552 Pain in left hip: Secondary | ICD-10-CM | POA: Insufficient documentation

## 2022-07-29 DIAGNOSIS — R413 Other amnesia: Secondary | ICD-10-CM | POA: Diagnosis not present

## 2022-07-29 DIAGNOSIS — G4733 Obstructive sleep apnea (adult) (pediatric): Secondary | ICD-10-CM | POA: Diagnosis not present

## 2022-07-29 DIAGNOSIS — J9611 Chronic respiratory failure with hypoxia: Secondary | ICD-10-CM

## 2022-07-29 NOTE — Therapy (Signed)
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 DOB:[birth year], 67 y.o., male Today's Date: 07/29/2022  END OF SESSION:  PT End of Session - 07/29/22 0950     Visit Number 4                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[place])    ongoing evaluation for elevated R>L adosterone secretion   Arthritis    Atrial enlargement, bilateral    Atrial fibrillation (HCC)    paroxysmal   Atrial flutter (HCC)    Complication of anesthesia    could not urinate after UHR, needed cath, mental fog sinc cervical fusion 08-09-16    Dysrhythmia    a-fib-had ablation x2   GERD (gastroesophageal reflux disease)    Hyperaldosteronism ([place])    Hypertension    Kidney cysts    "multiple"   Nephrolithiasis    Pneumonia 1990's   Pre-diabetes    Prostatitis    Renal insufficiency    "Cr 1.6"   Past Surgical History:  Procedure Laterality Date   ABLATION     ATRIAL FIBRILLATION ABLATION N/A 09/28/2012   Procedure: ATRIAL FIBRILLATION ABLATION;  Surgeon: Thompson Grayer, MD;  Location: [facility];  Service: Cardiovascular;  Laterality: N/A;   atrial fibrillation ablation with CTI ablation  05/27/11, 09/28/12   ablation for afib and atrial flutter by Dr Norman Herrlich SURGERY  08/09/2016   C 5 6  C 6 to C 7 cervical fusion   CARDIOVERSION  07/28/2011   Procedure: CARDIOVERSION;  Surgeon: Lelon Perla, MD;  Location: [facility];  Service: Cardiovascular;  Laterality: N/A;   COLONOSCOPY WITH PROPOFOL N/A 03/03/2015   Procedure: COLONOSCOPY WITH PROPOFOL;  Surgeon: Garlan Fair, MD;  Location: [facility];  Service: Gastroenterology;  Laterality: N/A;   double j stent placement  06/2004   left   ESOPHAGOGASTRODUODENOSCOPY (EGD) WITH PROPOFOL N/A 03/03/2015   Procedure: ESOPHAGOGASTRODUODENOSCOPY (EGD) WITH PROPOFOL;  Surgeon: Garlan Fair, MD;  Location: [facility];  Service: Gastroenterology;   Laterality: N/A;   ESOPHAGOGASTRODUODENOSCOPY (EGD) WITH PROPOFOL N/A 10/25/2016   Procedure: ESOPHAGOGASTRODUODENOSCOPY (EGD) WITH PROPOFOL;  Surgeon: Garlan Fair, MD;  Location: [facility];  Service: Endoscopy;  Laterality: N/A;   INGUINAL HERNIA REPAIR     "as a child; ? right"   INGUINAL HERNIA REPAIR Left 06/23/2016   Procedure: OPEN LEFT INGUINAL HERNIA  REPAIR WITH MESH;  Surgeon: Johnathan Hausen, MD;  Location: [facility];  Service: General;  Laterality: Left;   INSERTION OF MESH Left 06/23/2016   Procedure: INSERTION OF MESH;  Surgeon: Johnathan Hausen, MD;  Location: [facility];  Service: General;  Laterality: Left;   LEFT HEART CATH AND CORONARY ANGIOGRAPHY N/A 07/25/2018   Procedure: LEFT HEART CATH AND CORONARY ANGIOGRAPHY;  Surgeon: Belva Crome, MD;  Location: [facility];  Service: Cardiovascular;  Laterality: N/A;   LUNG BIOPSY  1993   NASAL SEPTOPLASTY W/ TURBINOPLASTY  07/2006   prostate biopsy  2011   schwannoma removal  ~01/2010   left thorax   TEE WITHOUT CARDIOVERSION N/A 09/27/2012   Procedure: TRANSESOPHAGEAL ECHOCARDIOGRAM (TEE);  Surgeon: Larey Dresser, MD;  Location: [facility];  Service: Cardiovascular;  Laterality: N/A;   ULTRASOUND GUIDANCE FOR VASCULAR ACCESS  07/25/2018   Procedure: Ultrasound Guidance For Vascular Access;  Surgeon: Belva Crome, MD;  Location: [facility];  Service: Cardiovascular;;   UMBILICAL HERNIA REPAIR  07/2006   Patient Active Problem List   Diagnosis Date Noted   Trochanteric bursitis, left hip 02/22/2022   Moderate COPD (chronic obstructive pulmonary disease) ([place]) 03/26/2020   OSA (obstructive sleep apnea) 03/26/2020   Pulmonary emboli (HCC) 03/14/2019   Chronic respiratory failure with hypoxia ([place]) 08/18/2018   Hyperlipidemia 08/17/2018   Community acquired pneumonia of right lower lobe of lung 08/16/2018   CKD (chronic kidney disease), stage II 08/16/2018   Abnormal ECG  07/25/2018   Incontinence of feces 04/18/2018   Urge incontinence of urine 03/11/2018   Renal insufficiency    Prostatitis    Pre-diabetes    Pneumonia    Nephrolithiasis    Kidney cysts    Hyperaldosteronism (HCC)    GERD (gastroesophageal reflux disease)    Dysrhythmia    Complication of anesthesia    Atrial enlargement, bilateral    Arthritis    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[place])    Inguinal hernia of left side without obstruction or gangrene Nov 2017 06/23/2016   Adjustment disorder with depressed mood 05/11/2016   Atrial fibrillation ([place]) 03/18/2011   Atrial flutter ([place]) 03/18/2011   Hypertension 03/18/2011     REFERRING PROVIDER: Silverio Decamp, MD  REFERRING DIAG: M70.62 (ICD-10-CM) - Trochanteric bursitis, left hip  THERAPY DIAG:  Pain in left hip  Muscle weakness (generalized)  Rationale for Evaluation and Treatment: Rehabilitation  ONSET DATE: June 2023  SUBJECTIVE:   SUBJECTIVE STATEMENT: Patient states he cancelled last week's appointment due to having a cold.  He thinks his cold is turning into a sinus infection now.  Pt states he has actually felt worse the past couple of days.  He has a H/A and colored drainage.   PERTINENT HISTORY: -A-fib with a hx of ablation x 2, Hx of PE which may have led to chronic pulmonary issues -incontinence--Pt has a sacral nerve stimulator  -Paralyzed left diaphragm per pt, arthritis, pre-diabetic, cervical fusion, and hernia repairs -Pt uses bilat hearing aides  PAIN:  NPRS:  1/10 Location:  L lateral hip Aggravating factors: can be surprising, can be a little random depends on the movement  Relieving factors: not doing aggravating movements Description: tightness   PRECAUTIONS: Other: sacral nerve stimulator--No E-stim; cervical fusion.  WEIGHT BEARING RESTRICTIONS: No  FALLS:  Has patient fallen in last 6 months? No  LIVING ENVIRONMENT: Lives with: lives with their spouse Lives in: 2 story  home Stairs: yes.   Has following equipment at home: none  OCCUPATION: He is a retired Artist.  PLOF: Independent; Pt able to play tennis and pickleball.   PATIENT GOALS: improve strength   OBJECTIVE:   DIAGNOSTIC FINDINGS: X rays: IMPRESSION: 1. No acute fracture or dislocation. 2. Mild degenerative changes at the hips bilaterally and in the lower lumbar spine.   TODAY'S TREATMENT:                                        Treatment deferred today.  PATIENT EDUCATION:  Education details:  POC Person educated: Patient Education method: Explanation, demonstration, verbal cues, and tactile cues Education comprehension: verbalized understanding, returned demonstration, verbal and tactile cues required  HOME EXERCISE PROGRAM: Pt has a HEP from MD currently  ASSESSMENT:  CLINICAL IMPRESSION: PT withheld Rx today due to pt not feeling well.  Pt states he is going to call his primary MD.    OBJECTIVE IMPAIRMENTS: Abnormal gait, decreased activity tolerance, decreased endurance, decreased mobility, difficulty walking, decreased strength, increased fascial restrictions, impaired flexibility, and pain.   ACTIVITY LIMITATIONS: stairs and transfers  PARTICIPATION LIMITATIONS:  walking program  PERSONAL FACTORS: 3+ comorbidities: A-fib with a hx of ablation x 2, Hx of PE which may have led to chronic pulmonary issues, cervical fusion,arthritis   are also affecting patient's functional outcome.   REHAB POTENTIAL: Good  CLINICAL DECISION MAKING: Stable/uncomplicated  EVALUATION COMPLEXITY: Low   GOALS:   SHORT TERM GOALS: Target date: 07/12/2022  Pt will report at least a 25% improvement in pain and sx's overall.  Baseline: Goal status: INITIAL  2.  Pt will ambulate with symmetrical Wb'ing in bilat LE's and increased stance time on L.  Baseline:  Goal status: INITIAL    LONG TERM  GOALS: Target date: 08/02/2022   Pt will be independent with HEP for improved pain, strength, and function.   Baseline:  Goal status: INITIAL  2.  Pt will demo improved L hip abd strength to 5/5 MMT for improved performance of functional mobility including stairs and extended ambulation.   Baseline:  Goal status: INITIAL  3.  Pt will report improved tolerance with extended ambulation and walking his dog.  Baseline:  Goal status: INITIAL  4.  Pt will report improved performance of stairs.   Baseline:  Goal status: INITIAL    PLAN:  PT FREQUENCY: 1x/week  PT DURATION: other: 4-6 weeks  PLANNED INTERVENTIONS: Therapeutic exercises, Therapeutic activity, Neuromuscular re-education, Balance training, Gait training, Patient/Family education, Self Care, Joint mobilization, Stair training, Dry Needling, Cryotherapy, Moist heat, Taping, Ultrasound, Manual therapy, and Re-evaluation  PLAN FOR NEXT SESSION:  Progress hip abductor strength.  STW to ITB.  Update HEP.  Pt has a sacral nerve stimulator--No E-stim   Selinda Michaels III PT, DPT 07/29/22 9:50 AM

## 2022-08-04 ENCOUNTER — Telehealth: Payer: Self-pay | Admitting: Pulmonary Disease

## 2022-08-05 ENCOUNTER — Ambulatory Visit (HOSPITAL_BASED_OUTPATIENT_CLINIC_OR_DEPARTMENT_OTHER): Payer: Medicare Other | Admitting: Physical Therapy

## 2022-08-05 ENCOUNTER — Encounter (HOSPITAL_BASED_OUTPATIENT_CLINIC_OR_DEPARTMENT_OTHER): Payer: Self-pay | Admitting: Physical Therapy

## 2022-08-05 DIAGNOSIS — M25552 Pain in left hip: Secondary | ICD-10-CM

## 2022-08-05 DIAGNOSIS — R413 Other amnesia: Secondary | ICD-10-CM | POA: Diagnosis not present

## 2022-08-05 DIAGNOSIS — M6281 Muscle weakness (generalized): Secondary | ICD-10-CM | POA: Diagnosis present

## 2022-08-05 NOTE — Therapy (Signed)
OUTPATIENT PHYSICAL THERAPY LOWER EXTREMITY TREATMENT / PROGRESS NOTE  Progress Note Reporting Period 06/21/2022 to 08/05/2022  See note below for Objective Data and Assessment of Progress/Goals.       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[place])    ongoing evaluation for elevated R>L adosterone secretion   Arthritis    Atrial enlargement, bilateral    Atrial fibrillation (HCC)    paroxysmal   Atrial flutter (HCC)    Complication of anesthesia    could not urinate after UHR, needed cath, mental fog sinc cervical fusion 08-09-16    Dysrhythmia    a-fib-had ablation x2   GERD (gastroesophageal reflux disease)    Hyperaldosteronism ([place])    Hypertension    Kidney cysts    "multiple"   Nephrolithiasis    Pneumonia 1990's   Pre-diabetes    Prostatitis    Renal insufficiency    "Cr 1.6"   Past Surgical History:  Procedure Laterality Date   ABLATION     ATRIAL FIBRILLATION ABLATION N/A 09/28/2012   Procedure: ATRIAL FIBRILLATION ABLATION;  Surgeon: Thompson Grayer, MD;  Location: [facility];  Service: Cardiovascular;  Laterality: N/A;   atrial fibrillation ablation with CTI ablation  05/27/11, 09/28/12   ablation for afib and atrial flutter by Dr Norman Herrlich SURGERY  08/09/2016   C 5 6  C 6 to C 7 cervical fusion   CARDIOVERSION  07/28/2011   Procedure: CARDIOVERSION;  Surgeon: Lelon Perla, MD;  Location: [facility];  Service: Cardiovascular;  Laterality: N/A;    COLONOSCOPY WITH PROPOFOL N/A 03/03/2015   Procedure: COLONOSCOPY WITH PROPOFOL;  Surgeon: Garlan Fair, MD;  Location: [facility];  Service: Gastroenterology;  Laterality: N/A;   double j stent placement  06/2004   left   ESOPHAGOGASTRODUODENOSCOPY (EGD) WITH PROPOFOL N/A 03/03/2015   Procedure: ESOPHAGOGASTRODUODENOSCOPY (EGD) WITH PROPOFOL;  Surgeon: Garlan Fair, MD;  [facility];  Service: Gastroenterology;  Laterality: N/A;   ESOPHAGOGASTRODUODENOSCOPY (EGD) WITH PROPOFOL N/A 10/25/2016   Procedure: ESOPHAGOGASTRODUODENOSCOPY (EGD) WITH PROPOFOL;  Surgeon: Garlan Fair, MD;  Location: [facility];  Service: Endoscopy;  Laterality: N/A;   INGUINAL HERNIA REPAIR     "as a child; ? right"   INGUINAL HERNIA REPAIR Left 06/23/2016   Procedure: OPEN LEFT INGUINAL HERNIA  REPAIR WITH MESH;  Surgeon: Johnathan Hausen, MD;  Location: [facility];  Service: General;  Laterality: Left;   INSERTION OF MESH Left 06/23/2016   Procedure: INSERTION OF MESH;  Surgeon: Johnathan Hausen, MD;  Location: [facility];  Service: General;  Laterality: Left;   LEFT HEART CATH AND CORONARY ANGIOGRAPHY N/A 07/25/2018   Procedure: LEFT HEART CATH AND CORONARY ANGIOGRAPHY;  Surgeon: Belva Crome, MD;  Location: [facility];  Service:  Cardiovascular;  Laterality: N/A;   LUNG BIOPSY  1993   NASAL SEPTOPLASTY W/ TURBINOPLASTY  07/2006   prostate biopsy  2011   schwannoma removal  ~01/2010   left thorax   TEE WITHOUT CARDIOVERSION N/A 09/27/2012   Procedure: TRANSESOPHAGEAL ECHOCARDIOGRAM (TEE);  Surgeon: Larey Dresser, MD;  Location: [facility];  Service: Cardiovascular;  Laterality: N/A;   ULTRASOUND GUIDANCE FOR VASCULAR ACCESS  07/25/2018   Procedure: Ultrasound Guidance For Vascular Access;  Surgeon: Belva Crome, MD;  Location: [facility];  Service: Cardiovascular;;   UMBILICAL HERNIA REPAIR  07/2006   Patient Active Problem List   Diagnosis  Date Noted   Trochanteric bursitis, left hip 02/22/2022   Moderate COPD (chronic obstructive pulmonary disease) ([place]) 03/26/2020   OSA (obstructive sleep apnea) 03/26/2020   Pulmonary emboli (HCC) 03/14/2019   Chronic respiratory failure with hypoxia ([place]) 08/18/2018   Hyperlipidemia 08/17/2018   Community acquired pneumonia of right lower lobe of lung 08/16/2018   CKD (chronic kidney disease), stage II 08/16/2018   Abnormal ECG 07/25/2018   Incontinence of feces 04/18/2018   Urge incontinence of urine 03/11/2018   Renal insufficiency    Prostatitis    Pre-diabetes    Pneumonia    Nephrolithiasis    Kidney cysts    Hyperaldosteronism (HCC)    GERD (gastroesophageal reflux disease)    Dysrhythmia    Complication of anesthesia    Atrial enlargement, bilateral    Arthritis    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[place])    Inguinal hernia of left side without obstruction or gangrene Nov 2017 06/23/2016   Adjustment disorder with depressed mood 05/11/2016   Atrial fibrillation ([place]) 03/18/2011   Atrial flutter ([place]) 03/18/2011   Hypertension 03/18/2011     REFERRING PROVIDER: Silverio Decamp, MD  REFERRING DIAG: M70.62 (ICD-10-CM) - Trochanteric bursitis, left hip  THERAPY DIAG:  Pain in left hip  Muscle weakness (generalized)  Rationale for Evaluation and Treatment: Rehabilitation  ONSET DATE: June 2023  SUBJECTIVE:   SUBJECTIVE STATEMENT: Pt saw MD, received antibiotics, and is feeling better.  Pt worked out with trainer on Tuesday and did squats with resistance.  His legs were stiff and sore after squats, had no hip pain afterwards.  Pt states he has some pain with the home exercises, but doesn't last.  Pt reports 50-75% improvement overall in pain and sx's.  Pt reports improved performance of stairs, but L leg still feels weaker.  Pt states he takes steps 1 at a time but has been doing that for years.  Pt has not been ambulating increased distance or walking the dogs  much due to the cold weather.    PERTINENT HISTORY: -A-fib with a hx of ablation x 2, Hx of PE which may have led to chronic pulmonary issues -incontinence--Pt has a sacral nerve stimulator  -Paralyzed left diaphragm per pt, arthritis, pre-diabetic, cervical fusion, and hernia repairs -Pt uses bilat hearing aides  PAIN:  NPRS:  1/10 current, 2/10 worst, 1/10 best Location:  L lateral hip Aggravating factors: can be surprising, can be a little random depends on the movement  Relieving factors: not doing aggravating movements Description: tightness   PRECAUTIONS: Other: sacral nerve stimulator--No E-stim; cervical fusion.  WEIGHT BEARING RESTRICTIONS: No  FALLS:  Has patient fallen in last 6 months? No  LIVING ENVIRONMENT: Lives with: lives with their spouse Lives in: 2 story home Stairs: yes.   Has following equipment at home: none  OCCUPATION: He is a  retired ENT.  PLOF: Independent; Pt able to play tennis and pickleball.   PATIENT GOALS: improve strength   OBJECTIVE:   DIAGNOSTIC FINDINGS: X rays: IMPRESSION: 1. No acute fracture or dislocation. 2. Mild degenerative changes at the hips bilaterally and in the lower lumbar spine.   TODAY'S TREATMENT:             Reviewed current function, pain levels, response to prior Rx, and HEP compliance.  Assessed gait, strength, and ROM.  Pt completed FOTO. Pt performed: S/L hip abduction with hip circles x10 cw and ccw bilat S/L clams with RTB x 10 and GTB  x10 Lateral band walks with RTB/GTB above thighs x 1/3 laps respectively    L HIP AROM:  ER: 34 deg, IR:  29 deg without pain  LOWER EXTREMITY STRENGTH:   HHD AND MMT Right eval Left eval Right 1/11 Left 1/11  Hip flexion 31.8 28.4 37.0 30.1  Hip extension 4+/5 4+/5 5/5 5/5  Hip abduction 41.8 ; 5/5 43.2 ; 4/5 47.8 45.2 ; 5/5  Hip adduction        Hip internal rotation 5/5 5/5    Hip external rotation 5/5 5/5    Knee flexion        Knee extension 5/5  5/5    Ankle dorsiflexion        Ankle plantarflexion        Ankle inversion        Ankle eversion         (Blank rows = not tested)    GAIT: Assistive device utilized: None Level of assistance: Complete Independence Comments: Pt continues to have increased lean to R and minimally increased Wb'ing thru R LE.                                                                               PATIENT SURVEYS:  FOTO:  Initial/Current:  67/69.  Goal of 70 at visit #10   PATIENT EDUCATION:  Education details: exercise form, objective findings and progress, goal progress, POC, and HEP.   Person educated: Patient Education method: Explanation, demonstration, verbal cues, and tactile cues Education comprehension: verbalized understanding, returned demonstration, verbal and tactile cues required  HOME EXERCISE PROGRAM:   ASSESSMENT:  CLINICAL IMPRESSION: Patient has made great progress in PT.  He reports improved performance of stairs and 50-75% improvement overall in sx's.  Pt hasn't been walking the dogs or performing extended ambulation distance due to cold weather and also being sick.  He continues to have increased rightward lean with gait.  Pt demonstrates improved L hip ER AROM and had no pain with IR AROM.  Pt demonstrates improved bilat hip flexion and abduction strength.  Pt continues to have difficulty with performing S/L hip abd circles requiring cuing/instruction for correct form.  Pt has met STG #1 and LTG's #2,4.  Pt should benefit from 2-3 more weeks of PT to ensure independence with HEP and for improved sx's with ambulation.     OBJECTIVE IMPAIRMENTS: Abnormal gait, decreased activity tolerance, decreased endurance, decreased mobility, difficulty walking, decreased strength, increased fascial restrictions, impaired flexibility, and pain.   ACTIVITY LIMITATIONS: stairs and transfers  PARTICIPATION LIMITATIONS:  walking  program  PERSONAL FACTORS: 3+ comorbidities: A-fib with a  hx of ablation x 2, Hx of PE which may have led to chronic pulmonary issues, cervical fusion,arthritis   are also affecting patient's functional outcome.   REHAB POTENTIAL: Good  CLINICAL DECISION MAKING: Stable/uncomplicated  EVALUATION COMPLEXITY: Low   GOALS:   SHORT TERM GOALS: Target date: 07/12/2022  Pt will report at least a 25% improvement in pain and sx's overall.  Baseline: Goal status:  GOAL MET  2.  Pt will ambulate with symmetrical Wb'ing in bilat LE's and increased stance time on L.  Baseline:  Goal status:  ONGOING    LONG TERM GOALS: Target date: 08/02/2022   Pt will be independent with HEP for improved pain, strength, and function.   Baseline:  Goal status: PROGRESSING Target date: 08/26/2022   2.  Pt will demo improved L hip abd strength to 5/5 MMT for improved performance of functional mobility including stairs and extended ambulation.   Baseline:  Goal status: GOAL MET  3.  Pt will report improved tolerance with extended ambulation and walking his dog.  Baseline:  Goal status: NOT MET--Pt has not walked much due to the weather Target date: 08/26/2022  4.  Pt will report improved performance of stairs.   Baseline:  Goal status: GOAL MET    PLAN:  PT FREQUENCY: 1x/week  PT DURATION: other: 2-3 weeks  PLANNED INTERVENTIONS: Therapeutic exercises, Therapeutic activity, Neuromuscular re-education, Balance training, Gait training, Patient/Family education, Self Care, Joint mobilization, Stair training, Dry Needling, Cryotherapy, Moist heat, Taping, Ultrasound, Manual therapy, and Re-evaluation  PLAN FOR NEXT SESSION: Cont with hip abd strengthening and soft tissue work.  Update HEP.  Pt has a sacral nerve stimulator--No E-stim   Selinda Michaels III PT, DPT 08/05/22 5:06 PM

## 2022-08-06 NOTE — Telephone Encounter (Signed)
-  PT has not heard from Dr. Elsworth Soho. Req. Call back.

## 2022-08-06 NOTE — Telephone Encounter (Signed)
Pt is wishing to speak to Dr. Elsworth Soho directly. Pt's phone number is [phone]

## 2022-08-12 ENCOUNTER — Encounter (HOSPITAL_BASED_OUTPATIENT_CLINIC_OR_DEPARTMENT_OTHER): Payer: Medicare Other | Admitting: Physical Therapy

## 2022-08-19 ENCOUNTER — Ambulatory Visit (HOSPITAL_BASED_OUTPATIENT_CLINIC_OR_DEPARTMENT_OTHER): Payer: Medicare Other | Admitting: Physical Therapy

## 2022-08-19 ENCOUNTER — Encounter (HOSPITAL_BASED_OUTPATIENT_CLINIC_OR_DEPARTMENT_OTHER): Payer: Self-pay | Admitting: Physical Therapy

## 2022-08-19 ENCOUNTER — Telehealth: Payer: Self-pay | Admitting: Pulmonary Disease

## 2022-08-19 DIAGNOSIS — R413 Other amnesia: Secondary | ICD-10-CM | POA: Diagnosis not present

## 2022-08-19 DIAGNOSIS — M25552 Pain in left hip: Secondary | ICD-10-CM

## 2022-08-19 DIAGNOSIS — M6281 Muscle weakness (generalized): Secondary | ICD-10-CM

## 2022-08-19 NOTE — Therapy (Signed)
OUTPATIENT PHYSICAL THERAPY LOWER EXTREMITY TREATMENT / PROGRESS NOTE  Progress Note Reporting Period 06/21/2022 to 08/05/2022  See note below for Objective Data and Assessment of Progress/Goals.       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 DOB:September 24, 1955, 67 y.o., male Today's Date: 08/19/2022  END OF SESSION:  PT End of Session - 08/19/22 0935     Visit Number 6    Number of Visits 8    Date for PT Re-Evaluation 08/26/22    Authorization Type MCR A and B    PT Start Time 0931    PT Stop Time 1013    PT Time Calculation (min) 42 min    Activity Tolerance Patient tolerated treatment well    Behavior During Therapy WFL for tasks assessed/performed                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[place])    ongoing evaluation for elevated R>L adosterone secretion   Arthritis    Atrial enlargement, bilateral    Atrial fibrillation (HCC)    paroxysmal   Atrial flutter (HCC)    Complication of anesthesia    could not urinate after UHR, needed cath, mental fog sinc cervical fusion 08-09-16    Dysrhythmia    a-fib-had ablation x2   GERD (gastroesophageal reflux disease)    Hyperaldosteronism ([place])    Hypertension    Kidney cysts    "multiple"   Nephrolithiasis    Pneumonia 1990's   Pre-diabetes    Prostatitis    Renal insufficiency    "Cr 1.6"   Past Surgical History:  Procedure Laterality Date   ABLATION     ATRIAL FIBRILLATION ABLATION N/A 09/28/2012   Procedure: ATRIAL FIBRILLATION ABLATION;  Surgeon: Thompson Grayer, MD;  Location: [facility];  Service: Cardiovascular;  Laterality: N/A;   atrial fibrillation ablation with CTI ablation  05/27/11, 09/28/12   ablation for afib and atrial flutter by Dr Norman Herrlich SURGERY  08/09/2016   C 5 6  C 6 to C 7 cervical fusion   CARDIOVERSION  07/28/2011   Procedure: CARDIOVERSION;  Surgeon: Lelon Perla, MD;  Location: [facility];  Service: Cardiovascular;  Laterality: N/A;    COLONOSCOPY WITH PROPOFOL N/A 03/03/2015   Procedure: COLONOSCOPY WITH PROPOFOL;  Surgeon: Garlan Fair, MD;  Location: [facility];  Service: Gastroenterology;  Laterality: N/A;   double j stent placement  06/2004   left   ESOPHAGOGASTRODUODENOSCOPY (EGD) WITH PROPOFOL N/A 03/03/2015   Procedure: ESOPHAGOGASTRODUODENOSCOPY (EGD) WITH PROPOFOL;  Surgeon: Garlan Fair, MD;  Location: [facility];  Service: Gastroenterology;  Laterality: N/A;   ESOPHAGOGASTRODUODENOSCOPY (EGD) WITH PROPOFOL N/A 10/25/2016   Procedure: ESOPHAGOGASTRODUODENOSCOPY (EGD) WITH PROPOFOL;  Surgeon: Garlan Fair, MD;  Location: [facility];  Service: Endoscopy;  Laterality: N/A;   INGUINAL HERNIA REPAIR     "as a child; ? right"   INGUINAL HERNIA REPAIR Left 06/23/2016   Procedure: OPEN LEFT INGUINAL HERNIA  REPAIR WITH MESH;  Surgeon: Johnathan Hausen, MD;  Location: [facility];  Service: General;  Laterality: Left;   INSERTION OF MESH Left 06/23/2016   Procedure: INSERTION OF MESH;  Surgeon: Johnathan Hausen, MD;  Location: [facility];  Service: General;  Laterality: Left;   LEFT [name] CATH AND CORONARY ANGIOGRAPHY N/A 07/25/2018   Procedure: LEFT [name] CATH AND CORONARY ANGIOGRAPHY;  Surgeon: Belva Crome, MD;  Location: [facility];  Service:  Cardiovascular;  Laterality: N/A;   LUNG BIOPSY  1993   NASAL SEPTOPLASTY W/ TURBINOPLASTY  07/2006   prostate biopsy  2011   schwannoma removal  ~01/2010   left thorax   TEE WITHOUT CARDIOVERSION N/A 09/27/2012   Procedure: TRANSESOPHAGEAL ECHOCARDIOGRAM (TEE);  Surgeon: Larey Dresser, MD;  Location: [facility];  Service: Cardiovascular;  Laterality: N/A;   ULTRASOUND GUIDANCE FOR VASCULAR ACCESS  07/25/2018   Procedure: Ultrasound Guidance For Vascular Access;  Surgeon: Belva Crome, MD;  Location: [facility];  Service: Cardiovascular;;   UMBILICAL HERNIA REPAIR  07/2006   Patient Active Problem List   Diagnosis  Date Noted   Trochanteric bursitis, left hip 02/22/2022   Moderate COPD (chronic obstructive pulmonary disease) ([place]) 03/26/2020   OSA (obstructive sleep apnea) 03/26/2020   Pulmonary emboli (HCC) 03/14/2019   Chronic respiratory failure with hypoxia ([place]) 08/18/2018   Hyperlipidemia 08/17/2018   Community acquired pneumonia of right lower lobe of lung 08/16/2018   CKD (chronic kidney disease), stage II 08/16/2018   Abnormal ECG 07/25/2018   Incontinence of feces 04/18/2018   Urge incontinence of urine 03/11/2018   Renal insufficiency    Prostatitis    Pre-diabetes    Pneumonia    Nephrolithiasis    Kidney cysts    Hyperaldosteronism (HCC)    GERD (gastroesophageal reflux disease)    Dysrhythmia    Complication of anesthesia    Atrial enlargement, bilateral    Arthritis    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[place])    Inguinal hernia of left side without obstruction or gangrene Nov 2017 06/23/2016   Adjustment disorder with depressed mood 05/11/2016   Atrial fibrillation ([place]) 03/18/2011   Atrial flutter ([place]) 03/18/2011   Hypertension 03/18/2011     REFERRING PROVIDER: Silverio Decamp, MD  REFERRING DIAG: M70.62 (ICD-10-CM) - Trochanteric bursitis, left hip  THERAPY DIAG:  Pain in left hip  Muscle weakness (generalized)  Rationale for Evaluation and Treatment: Rehabilitation  ONSET DATE: June 2023  SUBJECTIVE:   SUBJECTIVE STATEMENT: Patient reports his hip pain comes and goes. He can not pinpoint a definete cause. He has been seeing his trainer 2x per week. He is having no pain this morning.     PERTINENT HISTORY: -A-fib with a hx of ablation x 2, Hx of PE which may have led to chronic pulmonary issues -incontinence--Pt has a sacral nerve stimulator  -Paralyzed left diaphragm per pt, arthritis, pre-diabetic, cervical fusion, and hernia repairs -Pt uses bilat hearing aides  PAIN:  NPRS:  1/10 current, 2/10 worst, 1/10 best Location:  L lateral  hip Aggravating factors: can be surprising, can be a little random depends on the movement  Relieving factors: not doing aggravating movements Description: tightness   PRECAUTIONS: Other: sacral nerve stimulator--No E-stim; cervical fusion.  WEIGHT BEARING RESTRICTIONS: No  FALLS:  Has patient fallen in last 6 months? No  LIVING ENVIRONMENT: Lives with: lives with their spouse Lives in: 2 story home Stairs: yes.   Has following equipment at home: none  OCCUPATION: He is a retired Artist.  PLOF: Independent; Pt able to play tennis and pickleball.   PATIENT GOALS: improve strength   OBJECTIVE:   DIAGNOSTIC FINDINGS: X rays: IMPRESSION: 1. No acute fracture or dislocation. 2. Mild degenerative changes at the hips bilaterally and in the lower lumbar spine.   TODAY'S TREATMENT:           1/25 LTR x20  Bridge x20  Gluteal stretch 2x20 sec hold  Leg press 40 lbs 2x15  Hip abduction 70 lbs 2x15  Knee extension 10 lbs 2x15      Lat visit  Reviewed current function, pain levels, response to prior Rx, and HEP compliance.  Assessed gait, strength, and ROM.  Pt completed FOTO. Pt performed: S/L hip abduction with hip circles x10 cw and ccw bilat S/L clams with RTB x 10 and GTB  x10 Lateral band walks with RTB/GTB above thighs x 1/3 laps respectively    L HIP AROM:  ER: 34 deg, IR:  29 deg without pain  LOWER EXTREMITY STRENGTH:   HHD AND MMT Right eval Left eval Right 1/11 Left 1/11  Hip flexion 31.8 28.4 37.0 30.1  Hip extension 4+/5 4+/5 5/5 5/5  Hip abduction 41.8 ; 5/5 43.2 ; 4/5 47.8 45.2 ; 5/5  Hip adduction        Hip internal rotation 5/5 5/5    Hip external rotation 5/5 5/5    Knee flexion        Knee extension 5/5 5/5    Ankle dorsiflexion        Ankle plantarflexion        Ankle inversion        Ankle eversion         (Blank rows = not tested)    GAIT: Assistive device utilized: None Level of assistance: Complete Independence Comments:  Pt continues to have increased lean to R and minimally increased Wb'ing thru R LE.                                                                               PATIENT SURVEYS:  FOTO:  Initial/Current:  67/69.  Goal of 70 at visit #10   PATIENT EDUCATION:  Education details: exercise form, objective findings and progress, goal progress, POC, and HEP.   Person educated: Patient Education method: Explanation, demonstration, verbal cues, and tactile cues Education comprehension: verbalized understanding, returned demonstration, verbal and tactile cues required  HOME EXERCISE PROGRAM:   ASSESSMENT:  CLINICAL IMPRESSION: The patient tolerated treatment well. We reviewed use of gym equipment for strengthening. He had no increase in pain. We also reviewed instability and balance exercises. He was advised if he finds them beneficial to get an air-ex for home. He reported some difficulty at times with fwd/ back balance. He was shown how to work on that on the air-ex.   OBJECTIVE IMPAIRMENTS: Abnormal gait, decreased activity tolerance, decreased endurance, decreased mobility, difficulty walking, decreased strength, increased fascial restrictions, impaired flexibility, and pain.   ACTIVITY LIMITATIONS: stairs and transfers  PARTICIPATION LIMITATIONS:  walking program  PERSONAL FACTORS: 3+ comorbidities: A-fib with a hx of ablation x 2, Hx of PE which may have led to chronic pulmonary issues, cervical fusion,arthritis   are also affecting patient's functional outcome.   REHAB POTENTIAL: Good  CLINICAL DECISION MAKING: Stable/uncomplicated  EVALUATION COMPLEXITY: Low   GOALS:   SHORT TERM GOALS: Target date: 07/12/2022  Pt will report at least a 25% improvement in pain and sx's overall.  Baseline: Goal status:  GOAL MET  2.  Pt will ambulate with symmetrical Wb'ing in bilat LE's and increased stance time on L.  Baseline:  Goal status:  ONGOING    LONG TERM GOALS: Target date:  08/02/2022   Pt will be independent with HEP for improved pain, strength, and function.   Baseline:  Goal status: PROGRESSING Target date: 08/26/2022   2.  Pt will demo improved L hip abd strength to 5/5 MMT for improved performance of functional mobility including stairs and extended ambulation.   Baseline:  Goal status: GOAL MET  3.  Pt will report improved tolerance with extended ambulation and walking his dog.  Baseline:  Goal status: NOT MET--Pt has not walked much due to the weather Target date: 08/26/2022  4.  Pt will report improved performance of stairs.   Baseline:  Goal status: GOAL MET    PLAN:  PT FREQUENCY: 1x/week  PT DURATION: other: 2-3 weeks  PLANNED INTERVENTIONS: Therapeutic exercises, Therapeutic activity, Neuromuscular re-education, Balance training, Gait training, Patient/Family education, Self Care, Joint mobilization, Stair training, Dry Needling, Cryotherapy, Moist heat, Taping, Ultrasound, Manual therapy, and Re-evaluation  PLAN FOR NEXT SESSION: Cont with hip abd strengthening and soft tissue work.  Update HEP.  Pt has a sacral nerve stimulator--No E-stim   Carolyne Littles PT DPT 08/19/22 9:49 AM

## 2022-08-19 NOTE — Telephone Encounter (Signed)
PT calling wanting to speak to Dr. Bari Mantis Nurse. Question is would his lung condition cause him to be constantly sleepy.  Pls call @ [phone]

## 2022-08-20 NOTE — Telephone Encounter (Signed)
Called and spoke w/ pt regarding Dr.Alva's message, he verbalized understanding and states that he will call the office back to make arrangements for an appt, I offered to make it while on the phone but he insisted he would rather call next week. Closing encounter.

## 2022-08-20 NOTE — Telephone Encounter (Signed)
Dr. Alva please advise.  

## 2022-08-30 NOTE — Progress Notes (Unsigned)
   PCP:  Leeroy Cha, MD Primary Cardiologist: None Electrophysiologist: Dr. 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 Since last being seen in our clinic the patient reports doing ***.  he denies chest pain, palpitations, dyspnea, PND, orthopnea, nausea, vomiting, dizziness, syncope, edema, weight gain, or early satiety.   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HCC)    ongoing evaluation for elevated R>L adosterone secretion   Arthritis    Atrial enlargement, bilateral    Atrial fibrillation (HCC)    paroxysmal   Atrial flutter (H[facility])    Complication of anesthesia    could not urinate after UHR, needed cath, mental fog sinc cervical fusion 08-09-16    Dysrhythmia    a-fib-had ablation x2   GERD (gastroesophageal reflux disease)    Hyperaldosteronism (HCC)    Hypertension    Kidney cysts    "multiple"   Nephrolithiasis    Pneumonia 1990's   Pre-diabetes    Prostatitis    Renal insufficiency    "Cr 1.6"    Current Outpatient Medications  Medication Instructions   allopurinol (ZYLOPRIM) 100 mg, Oral, 2 times daily   amoxicillin (AMOXIL) 500 mg, Oral, 3 times daily   apixaban (ELIQUIS) 5 mg, Oral, 2 times daily   atorvastatin (LIPITOR) 10 mg, Oral, Every evening   Cialis 20 mg, Oral, Daily PRN   cyanocobalamin (VITAMIN B12) 1,000 mcg, Oral, Daily   diltiazem (CARDIZEM CD) 360 mg, Oral, Every evening   eplerenone (INSPRA) 50 mg, Oral, 2 times daily   Fluticasone-Umeclidin-Vilant (TRELEGY ELLIPTA) 100-62.5-25 MCG/INH AEPB 1 puff, Inhalation, Daily   Glucosamine HCl (GLUCOSAMINE PO) Oral   hydrochlorothiazide (MICROZIDE) 12.5 mg, Oral, Daily   losartan (COZAAR) 100 mg, Oral, Daily   metFORMIN (GLUCOPHAGE-XR) 500 MG 24 hr tablet SMARTSIG:2 Tablet(s) By Mouth Every Evening   montelukast (SINGULAIR) 10 MG tablet TAKE  1 TABLET(10 MG) BY MOUTH AT BEDTIME   Multiple Vitamins-Minerals (ZINC PO) 1 tablet, Oral, 2 times daily   Myrbetriq 50 mg, Oral, Daily   omeprazole (PRILOSEC) 40 mg, Oral, Daily before supper   Potassium Gluconate 2.5 MEQ TABS 3 tablets, Oral, 2 times daily   PROAIR HFA 108 (90 Base) MCG/ACT inhaler SMARTSIG:2 Inhalation Via Inhaler Every 6 Hours PRN   sertraline (ZOLOFT) 100 mg, Oral, Daily   vitamin C 1,000 mg, Oral, Daily   XIGDUO XR 11-998 MG TB24 1 tablet, Daily    Physical Exam: There were no vitals filed for this visit.  GEN- NAD. A&O x 3. Normal affect. HEENT: normocephalic, atraumatic Lungs- CTAB, Normal effort Heart- {EPRHYTHM:28826}, No M/G/R Extremities- {EDEMA LEVEL:28147::"No"} peripheral edema. no clubbing or cyanosis; Skin- warm and dry, no rash or lesion  {EKGtoday:28818::"EKG is not ordered today"}  Additional studies reviewed include: Previous EP notes.   {Select studies to display:26339}  Assessment and Plan:  1. PAF EKG today shows *** Well controlled off therapy Continue eliquis   2. PTE Followed by Dr. Corrin Parker at [facility]   3. HTN Stable on current regimen   Follow up with {ZDGLO:75643} {EPFOLLOW UP:28173}  Shirley Friar, PA-C  08/30/22 8:01 AM

## 2022-08-31 ENCOUNTER — Encounter: Payer: Self-pay | Admitting: Student

## 2022-08-31 ENCOUNTER — Ambulatory Visit: Payer: Medicare Other | Attending: Sports Medicine | Admitting: Student

## 2022-08-31 VITALS — BP 136/78 | HR 66 | Ht 72.0 in | Wt 241.6 lb

## 2022-08-31 DIAGNOSIS — I480 Paroxysmal atrial fibrillation: Secondary | ICD-10-CM

## 2022-08-31 DIAGNOSIS — R5383 Other fatigue: Secondary | ICD-10-CM | POA: Insufficient documentation

## 2022-08-31 DIAGNOSIS — I10 Essential (primary) hypertension: Secondary | ICD-10-CM

## 2022-08-31 DIAGNOSIS — D6869 Other thrombophilia: Secondary | ICD-10-CM | POA: Diagnosis present

## 2022-08-31 MED ORDER — EPLERENONE 50 MG PO TABS: 50.0000 mg | ORAL_TABLET | Freq: Two times a day (BID) | ORAL | 3 refills | Status: DC

## 2022-08-31 NOTE — Patient Instructions (Signed)
Medication Instructions:  Your physician recommends that you continue on your current medications as directed. Please refer to the Current Medication list given to you today.  *If you need a refill on your cardiac medications before your next appointment, please call your pharmacy*   Lab Work: TODAY: BMET, CBC, TSH  If you have labs (blood work) drawn today and your tests are completely normal, you will receive your results only by: [place] (if you have MyChart) OR A paper copy in the mail If you have any lab test that is abnormal or we need to change your treatment, we will call you to review the results.   Testing/Procedures: Your physician has requested that you have an echocardiogram. Echocardiography is a painless test that uses sound waves to create images of your heart. It provides your doctor with information about the size and shape of your heart and how well your heart's chambers and valves are working. This procedure takes approximately one hour. There are no restrictions for this procedure. Please do NOT wear cologne, perfume, aftershave, or lotions (deodorant is allowed). Please arrive 15 minutes prior to your appointment time.   Follow-Up: At [facility], you and your health needs are our priority.  As part of our continuing mission to provide you with exceptional heart care, we have created designated Provider Care Teams.  These Care Teams include your primary Cardiologist (physician) and Advanced Practice Providers (APPs -  Physician Assistants and Nurse Practitioners) who all work together to provide you with the care you need, when you need it.   Your next appointment:   6 month(s)  Provider:   Lars Mage, MD

## 2022-09-01 ENCOUNTER — Other Ambulatory Visit: Payer: Self-pay

## 2022-09-01 ENCOUNTER — Other Ambulatory Visit: Payer: Medicare Other

## 2022-09-01 DIAGNOSIS — I480 Paroxysmal atrial fibrillation: Secondary | ICD-10-CM

## 2022-09-01 LAB — CBC
Hematocrit: 39.6 % (ref 37.5–51.0)
Hemoglobin: 12.8 g/dL — ABNORMAL LOW (ref 13.0–17.7)
MCH: 28.4 pg (ref 26.6–33.0)
MCHC: 32.3 g/dL (ref 31.5–35.7)
MCV: 88 fL (ref 79–97)
Platelets: 248 10*3/uL (ref 150–450)
RBC: 4.5 x10E6/uL (ref 4.14–5.80)
RDW: 15.3 % (ref 11.6–15.4)
WBC: 8.1 10*3/uL (ref 3.4–10.8)

## 2022-09-01 LAB — BASIC METABOLIC PANEL
BUN/Creatinine Ratio: 15 (ref 10–24)
BUN: 23 mg/dL (ref 8–27)
CO2: 27 mmol/L (ref 20–29)
Calcium: 9.9 mg/dL (ref 8.6–10.2)
Chloride: 105 mmol/L (ref 96–106)
Creatinine, Ser: 1.57 mg/dL — ABNORMAL HIGH (ref 0.76–1.27)
Glucose: 107 mg/dL — ABNORMAL HIGH (ref 70–99)
Potassium: 3.4 mmol/L — ABNORMAL LOW (ref 3.5–5.2)
Sodium: 147 mmol/L — ABNORMAL HIGH (ref 134–144)
eGFR: 48 mL/min/{1.73_m2} — ABNORMAL LOW (ref >59)

## 2022-09-01 LAB — TSH: TSH: 1.02 u[IU]/mL (ref 0.450–4.500)

## 2022-09-09 ENCOUNTER — Telehealth: Payer: Self-pay

## 2022-09-09 ENCOUNTER — Ambulatory Visit: Payer: Medicare Other

## 2022-09-09 DIAGNOSIS — E876 Hypokalemia: Secondary | ICD-10-CM

## 2022-09-09 DIAGNOSIS — I480 Paroxysmal atrial fibrillation: Secondary | ICD-10-CM

## 2022-09-09 DIAGNOSIS — Z79899 Other long term (current) drug therapy: Secondary | ICD-10-CM

## 2022-09-09 LAB — BASIC METABOLIC PANEL
BUN/Creatinine Ratio: 14 (ref 10–24)
BUN: 24 mg/dL (ref 8–27)
CO2: 29 mmol/L (ref 20–29)
Calcium: 9.8 mg/dL (ref 8.6–10.2)
Chloride: 101 mmol/L (ref 96–106)
Creatinine, Ser: 1.69 mg/dL — ABNORMAL HIGH (ref 0.76–1.27)
Glucose: 178 mg/dL — ABNORMAL HIGH (ref 70–99)
Potassium: 3.1 mmol/L — ABNORMAL LOW (ref 3.5–5.2)
Sodium: 143 mmol/L (ref 134–144)
eGFR: 44 mL/min/{1.73_m2} — ABNORMAL LOW (ref >59)

## 2022-09-09 MED ORDER — POTASSIUM CHLORIDE CRYS ER 20 MEQ PO TBCR: EXTENDED_RELEASE_TABLET | ORAL | 3 refills | Status: DC

## 2022-09-09 NOTE — Telephone Encounter (Signed)
I spoke with the pt with Andy's recommendations and he agreed and will take the prescribed K starting today with labs planned for 09/13/22.

## 2022-09-09 NOTE — Telephone Encounter (Signed)
-----   Message from Shirley Friar, PA-C sent at 09/09/2022  2:06 PM EST ----- His potassium is even worse.     Would strongly encourage him to switch to the prescription version of potassium tablets, especially if his OTC version is not regulated by the FDA, as there is no way to guarantee or confirm the amount of potassium in each tablet.   Would recommend he take 40 meq twice today (at least 2 hours apart) for a total of 80 meq and then start 20 meq daily with another BMET Monday, 2/19.  I CANNOT recommend he take the equivalent dose in OTC potassium tablets and potassium this low can put patients at risk of serious complications.   Legrand Como [address] [place], PA-C  09/09/2022 2:04 PM

## 2022-09-13 ENCOUNTER — Other Ambulatory Visit: Payer: Medicare Other

## 2022-09-16 ENCOUNTER — Encounter: Payer: Medicare Other | Attending: Psychology

## 2022-09-16 DIAGNOSIS — J9611 Chronic respiratory failure with hypoxia: Secondary | ICD-10-CM | POA: Diagnosis present

## 2022-09-16 DIAGNOSIS — I63139 Cerebral infarction due to embolism of unspecified carotid artery: Secondary | ICD-10-CM | POA: Insufficient documentation

## 2022-09-16 DIAGNOSIS — R413 Other amnesia: Secondary | ICD-10-CM | POA: Diagnosis not present

## 2022-09-16 DIAGNOSIS — G4733 Obstructive sleep apnea (adult) (pediatric): Secondary | ICD-10-CM | POA: Insufficient documentation

## 2022-09-16 DIAGNOSIS — J449 Chronic obstructive pulmonary disease, unspecified: Secondary | ICD-10-CM | POA: Diagnosis present

## 2022-09-24 NOTE — Progress Notes (Signed)
Behavioral Observations The patient appeared well-groomed and appropriately dressed. His manners were polite and appropriate to the situation. The patient's attitude towards testing was positive and his effort was good.  Neuropsychology Note  Casey Alias, MD completed 0000000 minutes of neuropsychological testing with technician, Casey [name], BA, under the supervision of Ilean Skill, PsyD., Clinical Neuropsychologist. The patient did not appear overtly distressed by the testing session, per behavioral observation or via self-report to the technician. Rest breaks were offered.   Clinical Decision Making: In considering the patient's current level of functioning, level of presumed impairment, nature of symptoms, emotional and behavioral responses during clinical interview, level of literacy, and observed level of motivation/effort, a battery of tests was selected by [name] during initial consultation on 07/29/2022. This was communicated to the technician. Communication between the neuropsychologist and technician was ongoing throughout the testing session and changes were made as deemed necessary based on patient performance on testing, technician observations and additional pertinent factors such as those listed above.  Tests Administered: Controlled Oral Word Association Test (COWAT; FAS & Animals)  Wechsler Adult Intelligence Scale, 4th Edition (WAIS-IV) Wechsler Memory Scale, 4th Edition (WMS-IV); Adult Battery  Results:  COWAT:  FAS total=57  Z= 1.35 Animals total= 27  Z= 2   WAIS-IV:  Composite Score Summary  Scale Sum of Scaled Scores Composite Score Percentile Rank 95% Conf. Interval Qualitative Description  Verbal Comprehension 55 VCI 150 >99.9 142-154 Very Superior  Perceptual Reasoning 42 PRI 123 94 116-128 Superior  Working Memory 27 WMI 119 90 111-125 High Average  Processing Speed 25 PSI 114 82 104-121 High Average  Full Scale 149 FSIQ 136 99 131-139  Very Superior  General Ability 97 GAI 144 99.8 138-148 Very Superior   Verbal Comprehension Subtests Summary  Subtest Raw Score Scaled Score Percentile Rank Reference Group Scaled Score SEM  Similarities 35 17 99 17 1.04  Vocabulary 57 19 99.9 19 0.67  Information 26 19 99.9 19 0.73   Perceptual Reasoning Subtests Summary  Subtest Raw Score Scaled Score Percentile Rank Reference Group Scaled Score SEM  Block Design 38 11 63 8 1.08  Matrix Reasoning 23 16 98 14 0.90  Visual Puzzles 18 15 95 11 0.85   Working Doctor, general practice Raw Score Scaled Score Percentile Rank Reference Group Scaled Score SEM  Digit Span 31 13 84 11 0.79  Arithmetic 18 14 91 13 0.99   Processing Speed Subtests Summary  Subtest Raw Score Scaled Score Percentile Rank Reference Group Scaled Score SEM  Symbol Search 34 13 84 10 1.31  Coding 63 12 75 8 0.99   WMS-IV  Index Score Summary  Index Sum of Scaled Scores Index Score Percentile Rank 95% Confidence Interval Qualitative Descriptor  Auditory Memory (AMI) 50 115 84 108-120 High Average  Visual Memory (VMI) 35 92 30 87-98 Average  Visual Working Memory (VWMI) 27 120 91 111-126 Superior  Immediate Memory (IMI) 42 103 58 97-109 Average  Delayed Memory (DMI) 43 106 66 99-112 Average   Primary Subtest Scaled Score Summary  Subtest Domain Raw Score Scaled Score Percentile Rank  Logical Memory I AM 34 13 84  Logical Memory II AM 27 13 84  Verbal Paired Associates I AM 39 13 84  Verbal Paired Associates II AM 10 11 63  Designs I VM 44 6 9  Designs II VM 46 9 37  Visual Reproduction I VM 33 10 50  Visual Reproduction II VM 21 10 50  Spatial Addition VWM 20 17 99  Symbol Span VWM 20 10 50   Auditory Memory Process Score Summary  Process Score Raw Score Scaled Score Percentile Rank Cumulative Percentage (Base Rate)  LM II Recognition 28 - - >75%  VPA II Recognition 40 - - >75%  VPA II Word Recall 18 12 75 -   Visual Memory Process Score  Summary  Process Score Raw Score Scaled Score Percentile Rank Cumulative Percentage (Base Rate)  DE I Content 34 10 50 -  DE I Spatial '8 4 2 '$ -  DE II Content 32 10 50 -  DE II Spatial 10 9 37 -  DE II Recognition 12 - - 17-25%  VR II Recognition 6 - - >75%  VR II Copy 43 - - >75%   ABILITY-MEMORY ANALYSIS  Ability Score:  VCI: 150 Date of Testing:  WAIS-IV; WMS-IV 2022/09/16  Predicted Difference Method   Index Predicted WMS-IV Index Score Actual WMS-IV Index Score Difference Critical Value  Significant Difference Y/N Base Rate  Auditory Memory 126 115 11 10.15 Y 20%  Visual Memory 123 92 31 9.30 Y 1%  Visual Working Memory 128 120 8 11.68 N   Immediate Memory 129 103 26 11.15 Y 2%  Delayed Memory 126 106 20 11.49 Y 5-10%     Feedback to Patient: Casey Alias, MD will return on 03/24/2023 for an interactive feedback session with [name] at which time his test performances, clinical impressions and treatment recommendations will be reviewed in detail. The patient understands he can contact our office should he require our assistance before this time.  160 minutes spent face-to-face with patient administering standardized tests, 30 minutes spent scoring Environmental education officer). [CPT [id number], [id number]  Full report to follow.

## 2022-10-05 ENCOUNTER — Ambulatory Visit (HOSPITAL_COMMUNITY): Payer: Medicare Other | Attending: Cardiovascular Disease

## 2022-10-05 DIAGNOSIS — I480 Paroxysmal atrial fibrillation: Secondary | ICD-10-CM

## 2022-10-05 LAB — ECHOCARDIOGRAM COMPLETE
AR max vel: 2.09 cm2
AV Area VTI: 2.45 cm2
AV Area mean vel: 2.08 cm2
AV Mean grad: 5.8 mmHg
AV Peak grad: 10.8 mmHg
Ao pk vel: 1.64 m/s
Area-P 1/2: 3.19 cm2
S' Lateral: 3.3 cm

## 2022-10-12 DIAGNOSIS — Z960 Presence of urogenital implants: Secondary | ICD-10-CM | POA: Insufficient documentation

## 2022-10-18 ENCOUNTER — Encounter: Payer: Self-pay | Admitting: Psychology

## 2022-10-18 NOTE — Progress Notes (Signed)
Neuropsychological Consultation   Patient:   Casey MANCHEGO, MD   DOB:   [id number]  MR Number:  [id number]  Location:  [facility] [place], [place] [id number] [place] [address] Dept: [phone]           Date of Service:   07/29/2022  Location of Service and Individuals present: Today's visit was conducted in my outpatient clinic office with the patient myself present.  Patient did describe some reports by the patient's wife as far as her thoughts on his condition but she was not present for this appointment.  Start Time:   3 PM End Time:   5 PM  Today's visit was 1 hour and 15 minutes spent in face-to-face clinical interview and the other 45 minutes was spent with record review, report writing and setting up testing protocols.  Patient Consent and Confidentiality: Limitations of confidentiality were described including the patient's report appearing in his electronic medical records and being provided to his referring physician as well as available in EMR to other appropriate medical professionals.  Patient consented to continuing with this evaluation.  Consent for Evaluation and Treatment:  Signed:  Yes Explanation of Privacy Policies:  Signed:  Yes Discussion of Confidentiality Limits:  Yes  Provider/Observer:  Ilean Skill, Psy.D.       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  The patient had previously been followed by Dr. Jannifer [name] before transferring to Dr. Jaynee Eagles and was initially seen for headaches and mild memory concerns  by Dr. Jannifer [name].  He was followed by Dr. Jannifer [name] since 2020.  Patient is also hard of hearing.  Patient continues to be social and interested in hobbies and continues to play tennis 1 night per week and travel with family and friends.  Serial MoCA testing has been stable.  No changes in geographic orientation noted.  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  Patient reports that his wife tells him that he is "not listening" to her and that he is either not hearing her well or forgetting what she says.  Purpose of the evaluation is to produce baseline objective assessment of cognitive variables and memory functions.  Patient reports that memory issues are primary difficulties and that he is also having some physical changes but is remaining quite active.  Patient does not report that he feels like his wife often catches him at inopportune times telling him things to do but admits that some of his lack of recall for what she is telling him may be due to him not paying attention as well.  Patient reports that memory difficulties are more likely to be semantic in nature rather than episodic memory difficulties.  Patient denies any word finding difficulties or targeted naming issues.  Patient  reports that he is making more lists to avoid forgetting things and working on various strategies to try to improve his retention of information that his wife tells him.  Patient denies any geographic disorientation, tremors, visual hallucinations or delusions.  Patient also describes some mild changes in balance.  Patient's past medical history includes history of A-fib with prior pulmonary embolism and previous ablation interventions for his A-fib on  2 occasions.  Patient does describe some shortness of breath and dyspnea on exertion since the pulmonary embolism.  Patient has remained on anticoagulation medications.  Patient began reporting difficulties with memory in 2020 but reports that these memory changes have been rather stable over time.  Patient has bilateral hearing aids.  Patient described an incidence of vertigo in 2022 upon wakening after having some nausea and vomiting the night prior.  Patient has had improvement in vertigo over time.  There is some mild gait changes noted by neurology but no falls.  No vision disturbance noted and there was no indications of focal numbness or weakness in arms, face or legs.  Patient has developed some mild slight numbness in his toes and he is borderline diabetic.  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[place])    ongoing evaluation for elevated R>L adosterone secretion   Arthritis    Atrial enlargement, bilateral    Atrial fibrillation (HCC)    paroxysmal   Atrial flutter (HCC)    Complication of anesthesia    could not urinate after UHR, needed cath, mental fog sinc cervical fusion 08-09-16    Dysrhythmia    a-fib-had ablation x2   GERD (gastroesophageal reflux disease)    Hyperaldosteronism ([place])    Hypertension    Kidney cysts    "multiple"   Nephrolithiasis    Pneumonia 1990's   Pre-diabetes    Prostatitis    Renal insufficiency    "Cr 1.6"         Patient Active Problem List   Diagnosis Date Noted   Trochanteric bursitis, left hip 02/22/2022   Moderate COPD (chronic obstructive pulmonary disease) ([place]) 03/26/2020   OSA (obstructive sleep apnea) 03/26/2020   Pulmonary emboli ([place]) 03/14/2019   Chronic respiratory failure with hypoxia ([place]) 08/18/2018   Hyperlipidemia 08/17/2018   Community acquired pneumonia of right lower lobe of lung 08/16/2018   CKD (chronic kidney disease), stage II 08/16/2018   Abnormal ECG 07/25/2018    Incontinence of feces 04/18/2018   Urge incontinence of urine 03/11/2018   Renal insufficiency    Prostatitis    Pre-diabetes    Pneumonia    Nephrolithiasis    Kidney cysts    Hyperaldosteronism (HCC)    GERD (gastroesophageal reflux disease)    Dysrhythmia    Complication of anesthesia    Atrial enlargement, bilateral    Arthritis    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  Some mild changes in coordination and gait have been noted.  Progression of Symptoms: Patient reports that his attentional and memory issues have remained fairly stable since onset.  Additional Tests and Measures from other records:  Neuroimaging Results: Patient  had an MRI head conducted on 03/2021 with comparisons to MRI conducted in December 2020.  Impressions indicated no evidence of recent infarction, hemorrhage or mass effect.  There was mild chronic microvascular ischemic changes and a tiny bilateral chronic cerebellar infarcts noted.  Laboratory Tests: Patient has had consistent elevations in blood glucose levels over the past 3 years.  Patient's vitamin D lab work have been in the lower end of the average range.  B1 levels were within normal limits along with methylmalonic acid, B12 and folate.  Sleep: Patient has been diagnosed with obstructive sleep apnea and consistently and compliantly uses his CPAP device.  Patient reports that he sleeps like a rock.  Patient reports that he feels rested in the morning.  Diet Pattern: Patient describes a good appetite and a healthy diet.  Behavioral Observation/Mental Status:   [name], MD   presents as a 67 y.o.-year-old Right handed Caucasian Male who appeared his stated age. his dress was Appropriate and he was Well Groomed and his manners were Appropriate to the situation.  his participation was indicative of Appropriate and Attentive behaviors.  There were not physical disabilities noted.  he displayed an appropriate level of cooperation and motivation.    Interactions:    Active Appropriate  Attention:   within normal limits and attention span and concentration were age appropriate  Memory:   within normal limits; recent and remote memory intact  Visuo-spatial:   not examined  Speech (Volume):  normal  Speech:   normal; normal  Thought Process:  Coherent and Relevant  Coherent, Logical, and Organized  Though Content:  WNL; not suicidal and not homicidal  Orientation:   person, place, time/date, and situation  Judgment:   Good  Planning:   Good  Affect:    Appropriate  Mood:    Euthymic  Insight:   Good  Intelligence:   very high  Marital Status/Living:  Patient was born and raised in [place] along with 3 siblings.  Patient is married to his wife of 23 years with 1 previous marriage of 17 years duration.  Patient has a 106 year old son who has had some issues with personality disorder and a healthy married daughter.  Educational and Occupational History:     Highest Level of Education:   Patient completed college as well as medical school receiving his medical degree always maintaining an excellent GPA.  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  Patient always did very well in math/science and had some relative disc occultly with history but this was not an issue.    Educational and Career Achievements: Patient was valedictorian of his high school and saluted [employer] in college.  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  Hobbies and Interests: Hobbies and interest include playing the piano, but soon, travel and playing tennis.  Impact of Symptoms on Social Functioning and Interpersonal Relationships: Patient reports that his memory issues are causing some stressors within his day-to-day relationships.   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  The patient had previously been followed by Dr. Jannifer [name] before transferring to Dr. Jaynee Eagles and was initially seen for headaches and mild memory concerns by Dr. Jannifer [name].  He was followed by Dr. Jannifer [name] since 2020.  Patient is also hard of hearing.  Patient continues to be social and interested in hobbies and continues to play tennis 1 night per week and travel with family and friends.  Serial MoCA testing has been stable.  No changes in geographic orientation noted.  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  Patient reports that his wife tells him that he is "not listening" to her and that he is either not hearing her well or forgetting what she says.  Purpose of the evaluation is to produce baseline objective assessment of cognitive variables and memory functions.   Patient reports that memory issues are primary difficulties and that he is also having some physical changes but is remaining quite active.  Patient does not report that he feels like his wife often catches him at inopportune times telling him things to do but admits that some of his lack of recall for what she is telling him may be due to him not paying attention as well.  Patient reports that memory difficulties are more likely to be semantic in nature rather than episodic memory difficulties.  Patient denies any word finding difficulties or targeted naming issues.  Patient reports that he is making more lists to avoid forgetting things and working on various strategies to try to improve his retention of information that his wife tells him.  Patient denies any geographic disorientation, tremors, visual hallucinations or delusions.  Patient also describes some mild changes in balance.  Disposition/Plan:  We have set the patient up for 4 neuropsychological assessment to look at cognitive memory functions as well as provide a baseline assessment of memory and learning capacities.  Diagnosis:    Memory loss  Chronic respiratory failure with hypoxia (HCC)  Moderate COPD (chronic obstructive pulmonary disease) (HCC)  OSA (obstructive sleep apnea)  Cerebrovascular accident (CVA) due to embolism of carotid artery, unspecified blood vessel laterality ([place])        Note: This document was prepared using Dragon voice recognition software and may include unintentional dictation errors.   Electronically Signed   _______________________ Ilean Skill, Psy.D. Clinical Neuropsychologist

## 2022-11-01 ENCOUNTER — Encounter (HOSPITAL_BASED_OUTPATIENT_CLINIC_OR_DEPARTMENT_OTHER): Payer: Self-pay | Admitting: Pulmonary Disease

## 2022-11-01 ENCOUNTER — Ambulatory Visit (INDEPENDENT_AMBULATORY_CARE_PROVIDER_SITE_OTHER): Payer: Medicare Other | Admitting: Pulmonary Disease

## 2022-11-01 VITALS — BP 134/78 | HR 64 | Wt 239.0 lb

## 2022-11-01 DIAGNOSIS — G4733 Obstructive sleep apnea (adult) (pediatric): Secondary | ICD-10-CM

## 2022-11-01 DIAGNOSIS — J4520 Mild intermittent asthma, uncomplicated: Secondary | ICD-10-CM

## 2022-11-01 NOTE — Assessment & Plan Note (Signed)
Some variability of lung function with FEV1 improving from 57 to 67% however no subjective benefit with Trelegy or other steroid/LABA combination. Will continue albuterol on an as-needed basis but take him off maintenance inhalers He knows to contact us should he develop dyspnea or wheezing

## 2022-11-01 NOTE — Assessment & Plan Note (Signed)
CPAP download was reviewed which shows excellent compliance, good control of events on auto settings 5 to 12 cm with average pressure of 9 and maximum pressure of 10.4 cm.  He is very compliant and CPAP is only helped to his daytime somnolence and fatigue  Weight loss encouraged, compliance with goal of at least 4-6 hrs every night is the expectation. Advised against medications with sedative side effects Cautioned against driving when sleepy - understanding that sleepiness will vary on a day to day basis We will give him prescription for travel CPAP at 10 cm

## 2022-11-01 NOTE — Progress Notes (Signed)
   Subjective:    Patient ID: Casey Richer, MD, male    DOB: 1955-11-13, 67 y.o.   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  He has been using his CPAP machine regularly, denies any problems with mask or pressure.  Weight is unchanged at 239 pounds At his last visit we decided to hold off on Trelegy and he has done well off this.  He does not report an increase in his dyspnea.  He is able to walk his dog for 30 minutes and do some workouts with his trainer. He has some travel coming up viking cruise [place] season is bothersome but he uses Singulair and OTC medicines as needed     Significant tests/ events reviewed 05/2018 ventilation perfusion scan: Low probability   10/2018: HRCT chest No evidence of interstitial lung disease resolution of right lung base opacity seen previously, left upper lobe lung nodule   03/14/2019: CTA chest Bilateral lower lobe acute pulmonary emboli with evidence of right ventricular strain RV LV ratio 1.2. Left upper lobe 5 mm sub-solid pulmonary nodule with groundglass    10/2019 CT chest wo contrast- Decreased size of left upper lobe nodule with a mean diameter of 4 mm, previously 6 mm and favored to reflect an infectious/inflammatory process.      04/2022 spirometry-FEV1 67%, ratio 71, FVC 70% - 08/2018 PFT:  FEV1 56%, ratio 68. No bronchodilator response. - Alpha 1 phenotype is MM   CPET ([facility] ) 10/2019 >> MVV 100%, pulmonary limitation to exercise, ratio 61, FEV1 59%, FVC 74%   HST 04/2020 >> AHI 23/h , 103 minutes with saturation less than 89%     Review of Systems neg for  any significant sore throat, dysphagia, itching, sneezing, nasal congestion or excess/ purulent secretions, fever, chills, sweats, unintended wt loss, pleuritic or exertional cp, hempoptysis, orthopnea pnd or change in chronic leg swelling. Also denies presyncope, palpitations, heartburn, abdominal pain, nausea, vomiting, diarrhea or change in bowel or urinary habits, dysuria,hematuria, rash, arthralgias, visual complaints, headache, numbness weakness or ataxia.     Objective:   Physical Exam  Gen. Pleasant, obese, in no distress ENT - no lesions, no post nasal drip Neck: No JVD, no thyromegaly, no carotid bruits Lungs: no use of accessory muscles, no dullness to percussion, decreased without rales or rhonchi  Cardiovascular: Rhythm regular, heart sounds  normal, no murmurs or gallops, no peripheral edema Musculoskeletal: No deformities, no cyanosis or clubbing , no tremors       Assessment & Plan:

## 2022-11-01 NOTE — Patient Instructions (Addendum)
X RX for travel CPAP 10 cm  Weight loss of 20 lbs advised

## 2022-11-02 ENCOUNTER — Telehealth: Payer: Self-pay | Admitting: Pulmonary Disease

## 2022-11-02 NOTE — Telephone Encounter (Signed)
[place], Delena Bali, Deri Fuelling; [place], [place]; Loraine Grip; [place], Lurena Joiner Good morning Sorry I didn't see the question. We do and patient can contacat [facility] [phone]. Will create pt in the system and scan rx and clinicals in if he decides to contact we will have on file.       Previous Messages    ----- Message ----- From: Jill Poling Sent: 11/02/2022  10:01 AM EDT To: Durward Fortes; Druscilla Brownie; Loraine Grip; * Subject: Question                                      Question, Do you all provider travel cpaps?  Thanks, [employer]

## 2022-11-02 NOTE — Telephone Encounter (Signed)
Casey [name] has accepted the order for this pt.   Nothing further needed

## 2022-11-02 NOTE — Telephone Encounter (Signed)
[facility] does not supply travel cpaps

## 2022-11-02 NOTE — Telephone Encounter (Signed)
[facility] made me aware that insurances typically does not cover for travel cpaps and recommends the pt try to purchase out of pocket if possible.

## 2022-11-09 ENCOUNTER — Telehealth: Payer: Self-pay | Admitting: Cardiology

## 2022-11-09 MED ORDER — METOPROLOL TARTRATE 25 MG PO TABS: ORAL_TABLET | ORAL | 1 refills | Status: AC

## 2022-11-09 NOTE — Telephone Encounter (Signed)
Discussed with Jorja Loa PA will call in PRN metoprolol  every 8 hours as needed for elevated rates. If still in AFib tomorrow will bring in for assessment. Pt in agreement.

## 2022-11-09 NOTE — Telephone Encounter (Signed)
Patient c/o Palpitations:  High priority if patient c/o lightheadedness, shortness of breath, or chest pain  How long have you had palpitations/irregular HR/ Afib? Are you having the symptoms now? Started today around 1 PM and currently still having it  Are you currently experiencing lightheadedness, SOB or CP? Lightheaded if moves too quickly  Do you have a history of afib (atrial fibrillation) or irregular heart rhythm? Yes  Have you checked your BP or HR? (document readings if available): Hr 150   Are you experiencing any other symptoms? No

## 2022-11-09 NOTE — Telephone Encounter (Signed)
Patient stated he played tennis last night, later on that night he has been experiencing diarrhea. Pt stated this morning, he woke up not feeling " the best", feeling lightheaded and fatigue. Pt checked his heart rate on bp machine HR 79 , he rechecked on his apple watch HR 150. Pt stated he still has diarrhea "it's getting better". However, he still feels lightheaded and weak. Explained ED precautions, patient voiced understanding. Will forward to MD and A-fib clinic for advise.

## 2022-11-10 ENCOUNTER — Ambulatory Visit (HOSPITAL_BASED_OUTPATIENT_CLINIC_OR_DEPARTMENT_OTHER)
Admission: RE | Admit: 2022-11-10 | Discharge: 2022-11-10 | Disposition: A | Discharge status: Home / Self Care | Payer: Medicare Other | Source: Ambulatory Visit | Attending: Internal Medicine | Admitting: Internal Medicine

## 2022-11-10 ENCOUNTER — Emergency Department (HOSPITAL_COMMUNITY)
Admission: EM | Admit: 2022-11-10 | Discharge: 2022-11-10 | Disposition: A | Discharge status: Home / Self Care | Payer: Medicare Other | Attending: Emergency Medicine | Admitting: Emergency Medicine

## 2022-11-10 ENCOUNTER — Other Ambulatory Visit: Payer: Self-pay

## 2022-11-10 ENCOUNTER — Emergency Department (HOSPITAL_COMMUNITY): Payer: Medicare Other

## 2022-11-10 ENCOUNTER — Encounter (HOSPITAL_COMMUNITY): Payer: Self-pay

## 2022-11-10 VITALS — BP 140/88 | HR 143 | Ht 72.0 in | Wt 234.8 lb

## 2022-11-10 DIAGNOSIS — Z86711 Personal history of pulmonary embolism: Secondary | ICD-10-CM | POA: Insufficient documentation

## 2022-11-10 DIAGNOSIS — D6869 Other thrombophilia: Secondary | ICD-10-CM

## 2022-11-10 DIAGNOSIS — Z79899 Other long term (current) drug therapy: Secondary | ICD-10-CM | POA: Insufficient documentation

## 2022-11-10 DIAGNOSIS — R7989 Other specified abnormal findings of blood chemistry: Secondary | ICD-10-CM | POA: Diagnosis not present

## 2022-11-10 DIAGNOSIS — Z7901 Long term (current) use of anticoagulants: Secondary | ICD-10-CM | POA: Insufficient documentation

## 2022-11-10 DIAGNOSIS — G4733 Obstructive sleep apnea (adult) (pediatric): Secondary | ICD-10-CM | POA: Insufficient documentation

## 2022-11-10 DIAGNOSIS — I10 Essential (primary) hypertension: Secondary | ICD-10-CM | POA: Insufficient documentation

## 2022-11-10 DIAGNOSIS — Z6831 Body mass index (BMI) 31.0-31.9, adult: Secondary | ICD-10-CM | POA: Insufficient documentation

## 2022-11-10 DIAGNOSIS — E669 Obesity, unspecified: Secondary | ICD-10-CM | POA: Insufficient documentation

## 2022-11-10 DIAGNOSIS — I480 Paroxysmal atrial fibrillation: Secondary | ICD-10-CM | POA: Insufficient documentation

## 2022-11-10 DIAGNOSIS — R002 Palpitations: Secondary | ICD-10-CM | POA: Diagnosis present

## 2022-11-10 LAB — COMPREHENSIVE METABOLIC PANEL
ALT: 20 U/L (ref 0–44)
AST: 19 U/L (ref 15–41)
Albumin: 3.4 g/dL — ABNORMAL LOW (ref 3.5–5.0)
Alkaline Phosphatase: 56 U/L (ref 38–126)
Anion gap: 12 (ref 5–15)
BUN: 30 mg/dL — ABNORMAL HIGH (ref 8–23)
CO2: 18 mmol/L — ABNORMAL LOW (ref 22–32)
Calcium: 9.1 mg/dL (ref 8.9–10.3)
Chloride: 110 mmol/L (ref 98–111)
Creatinine, Ser: 2.05 mg/dL — ABNORMAL HIGH (ref 0.61–1.24)
GFR, Estimated: 35 mL/min — ABNORMAL LOW (ref >60)
Glucose, Bld: 85 mg/dL (ref 70–99)
Potassium: 3.6 mmol/L (ref 3.5–5.1)
Sodium: 140 mmol/L (ref 135–145)
Total Bilirubin: 0.5 mg/dL (ref 0.3–1.2)
Total Protein: 6.4 g/dL — ABNORMAL LOW (ref 6.5–8.1)

## 2022-11-10 LAB — CBC
HCT: 44.1 % (ref 39.0–52.0)
Hemoglobin: 13.6 g/dL (ref 13.0–17.0)
MCH: 27.8 pg (ref 26.0–34.0)
MCHC: 30.8 g/dL (ref 30.0–36.0)
MCV: 90.2 fL (ref 80.0–100.0)
Platelets: 251 10*3/uL (ref 150–400)
RBC: 4.89 MIL/uL (ref 4.22–5.81)
RDW: 15.8 % — ABNORMAL HIGH (ref 11.5–15.5)
WBC: 11 10*3/uL — ABNORMAL HIGH (ref 4.0–10.5)
nRBC: 0 % (ref 0.0–0.2)

## 2022-11-10 LAB — MAGNESIUM: Magnesium: 1.9 mg/dL (ref 1.7–2.4)

## 2022-11-10 MED ORDER — PROPOFOL 10 MG/ML IV BOLUS
0.5000 mg/kg | Freq: Once | INTRAVENOUS | Status: AC
  Administered 2022-11-10: 53.3 mg via INTRAVENOUS
  Filled 2022-11-10: qty 20

## 2022-11-10 MED ORDER — LACTATED RINGERS IV BOLUS
1000.0000 mL | Freq: Once | INTRAVENOUS | Status: AC
  Administered 2022-11-10: 1000 mL via INTRAVENOUS

## 2022-11-10 NOTE — ED Provider Notes (Signed)
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  HPI 67 year old male with a history of prior distal A-fib who is on Eliquis presents with atrial fibrillation.  Felt himself going to A-fib yesterday.  Went to the A-fib clinic and they sent him here for possible cardioversion.  He states he feels lightheaded, fatigued, and is having palpitations.  He last ate around 11 AM.  He denies any chest pain or dyspnea.  He has been compliant with all of his medications.  He had an ablation but is been many years and he has not been in A-fib in many years as well.  He notes he has been having diarrhea for the last couple days after eating some oysters and hot sauce and has a little bit of abdominal cramping but no pain, fever, cough.  Home Medications Prior to Admission medications   Medication Sig Start Date End Date Taking? Authorizing Provider  allopurinol (ZYLOPRIM) 100 MG tablet Take 100 mg by mouth 2 (two) times daily.    [provider]  apixaban (ELIQUIS) 5 MG TABS tablet Take 1 tablet (5 mg total) by mouth 2 (two) times daily. 04/11/19   [name], Rachel Bo, DO  Ascorbic Acid (VITAMIN C) 1000 MG tablet Take 1,000 mg by mouth 3 (three) times a week.    [provider]  atorvastatin (LIPITOR) 10 MG tablet Take 10 mg by mouth every evening.     [provider]  CIALIS 20 MG tablet Take 20 mg by mouth daily as needed for erectile dysfunction.  09/23/16   [provider]  diltiazem (CARDIZEM CD) 360 MG 24 hr capsule Take 360 mg by mouth every evening. 10/04/16   [provider]  eplerenone (INSPRA) 50 MG tablet Take 1 tablet (50 mg total) by mouth 2 (two) times daily. 08/31/22   Graciella Freer, PA-C  Glucosamine 500 MG CAPS 1 capsule with a meal Orally twice  a day for 30 days    [provider]  hydrochlorothiazide (MICROZIDE) 12.5 MG capsule Take 12.5 mg by mouth daily.    [provider]  Inulin 2 g CHEW Taking 2 gummies by mouth daily 06/05/18   [provider]  losartan (COZAAR) 100 MG tablet Take 100 mg by mouth daily.    [provider]  metFORMIN (GLUCOPHAGE) 500 MG tablet Take 500 mg by mouth every morning.    [provider]  metoprolol tartrate (LOPRESSOR) 25 MG tablet Take 1 tablet by mouth every 8 hours as needed for afib HR over 100 11/09/22   Fenton, Clint R, PA  montelukast (SINGULAIR) 10 MG tablet TAKE 1 TABLET(10 MG) BY MOUTH AT BEDTIME 03/15/22   Glenford Bayley, NP  Multiple Vitamin (MULTIVITAMIN) capsule Take 1 capsule by mouth 3 (three) times a week.    [provider]  Multiple Vitamins-Minerals (ZINC PO) Take 1 tablet by mouth as needed.    [provider]  MYRBETRIQ 50 MG TB24 tablet Take 50 mg by mouth daily. 03/31/22   [provider]  omeprazole (PRILOSEC) 40 MG capsule Take 40 mg by mouth daily before supper.     [provider]  potassium chloride SA (KLOR-CON M) 20 MEQ tablet Take 2 tablets (40 meq) by mouth  twice today (at least 2 hours apart) for a total of 80 meq and then start one tablet (20 meq) once daily thereafter. Patient taking differently: Taking two tablets by mouth daily 09/09/22   Luane School  [facility] HFA 108 [phone] Base) MCG/ACT inhaler SMARTSIG:2 Inhalation Via Inhaler Every 6 Hours PRN 12/26/19   [provider]  scopolamine (TRANSDERM-SCOP) 1 MG/3DAYS Place 1 patch onto the skin as needed. 11/28/20   [provider]  sertraline (ZOLOFT) 100 MG tablet Take 100 mg by mouth daily. 06/18/20   [provider]  tamsulosin (FLOMAX) 0.4 MG CAPS capsule Take 0.4 mg by mouth every morning.    [provider]  vitamin B-12 (CYANOCOBALAMIN) 1000 MCG tablet Take 1,000 mcg by mouth daily.     [provider]  XIGDUO XR 11-998 MG TB24 Take 1 tablet by mouth daily. 03/19/22   [provider]      Allergies    Aldactone [spironolactone], Codeine, Keflex [cephalexin], Lisinopril, Nexium [esomeprazole magnesium], Sulfa antibiotics, and Sulfa drugs cross reactors    Review of Systems   Review of Systems  Constitutional:  Positive for fatigue. Negative for fever.  Respiratory:  Negative for cough and shortness of breath.   Cardiovascular:  Negative for chest pain.  Gastrointestinal:  Positive for diarrhea.  Neurological:  Positive for light-headedness.    Physical Exam Updated Vital Signs BP (!) 147/100   Pulse 72   Temp 98.5 F (36.9 C) (Oral)   Resp (!) 9   Ht 6' (1.829 m)   Wt 106.5 kg   SpO2 100%   BMI 31.84 kg/m  Physical Exam Vitals and nursing note reviewed.  Constitutional:      General: He is not in acute distress.    Appearance: He is well-developed. He is not ill-appearing or diaphoretic.  HENT:     Head: Normocephalic and atraumatic.  Cardiovascular:     Rate and Rhythm: Tachycardia present. Rhythm irregular.     Heart sounds: Normal heart sounds.  Pulmonary:     Effort: Pulmonary effort is normal.  Abdominal:     General: There is no distension.     Palpations: Abdomen is soft.     Tenderness: There is no abdominal tenderness.  Skin:    General: Skin is warm and dry.  Neurological:     Mental Status: He is alert.     ED Results / Procedures / Treatments   Labs (all labs ordered are listed, but only abnormal results are displayed) Labs Reviewed  CBC - Abnormal; Notable for the following components:      Result Value   WBC 11.0 (*)    RDW 15.8 (*)    All other components within normal limits  COMPREHENSIVE METABOLIC PANEL - Abnormal; Notable for the following components:   CO2 18 (*)    BUN 30 (*)    Creatinine, Ser 2.05 (*)    Total Protein 6.4 (*)    Albumin 3.4 (*)    GFR, Estimated 35 (*)    All other components  within normal limits  MAGNESIUM    EKG EKG Interpretation  Date/Time:  Wednesday November 10 2022 16:28:05 EDT Ventricular Rate:  130 PR Interval:    QRS Duration: 98 QT Interval:  324 QTC Calculation: 477 R Axis:   -56 Text Interpretation: Atrial fibrillation Inferolateral infarct, old Confirmed by Pricilla Loveless [phone]) on 11/10/2022 6:12:35 PM  Radiology DG Chest 2 View  Result Date: 11/10/2022 CLINICAL DATA:  Chest  pain EXAM: CHEST - 2 VIEW COMPARISON:  Chest x-ray dated September 05, 2020 FINDINGS: Cardiac and mediastinal contours are within normal limits. Elevation of the left hemidiaphragm. Mild bibasilar atelectasis. No evidence pleural effusion or pneumothorax. IMPRESSION: Elevation of the left hemidiaphragm and mild bibasilar atelectasis. Electronically Signed   By: Allegra Lai M.D.   On: 11/10/2022 16:02    Procedures .Sedation  Date/Time: 11/10/2022 8:03 PM  Performed by: Pricilla Loveless, MD Authorized by: Pricilla Loveless, MD   Consent:    Consent obtained:  Verbal and written   Consent given by:  Patient Universal protocol:    Immediately prior to procedure, a time out was called: yes   Pre-sedation assessment:    Time since last food or drink:  8 hours   NPO status caution: urgency dictates proceeding with non-ideal NPO status     ASA classification: class 2 - patient with mild systemic disease     Mallampati score:  II - soft palate, uvula, fauces visible   Pre-sedation assessments completed and reviewed: airway patency, cardiovascular function, hydration status, mental status, nausea/vomiting, pain level, respiratory function and temperature   Immediate pre-procedure details:    Reassessment: Patient reassessed immediately prior to procedure     Reviewed: vital signs, relevant labs/tests and NPO status     Verified: bag valve mask available, emergency equipment available, intubation equipment available, IV patency confirmed, oxygen available and suction  available   Procedure details (see MAR for exact dosages):    Preoxygenation:  Nasal cannula   Sedation:  Propofol   Intended level of sedation: deep   Analgesia:  None   Intra-procedure monitoring:  Blood pressure monitoring, cardiac monitor, continuous pulse oximetry, continuous capnometry, frequent LOC assessments and frequent vital sign checks   Intra-procedure events: none     Total Provider sedation time (minutes):  10 Post-procedure details:    Attendance: Constant attendance by certified staff until patient recovered     Recovery: Patient returned to pre-procedure baseline     Patient is stable for discharge or admission: yes     Procedure completion:  Tolerated well, no immediate complications .Cardioversion  Date/Time: 11/10/2022 8:03 PM  Performed by: Pricilla Loveless, MD Authorized by: Pricilla Loveless, MD   Consent:    Consent obtained:  Written and verbal   Consent given by:  Patient   Alternatives discussed:  Rate-control medication Pre-procedure details:    Cardioversion basis:  Emergent   Rhythm:  Atrial fibrillation   Electrode placement:  Anterior-posterior Patient sedated: Yes. Refer to sedation procedure documentation for details of sedation.  Attempt one:    Cardioversion mode:  Synchronous   Waveform:  Biphasic   Shock (Joules):  300   Shock outcome:  Conversion to normal sinus rhythm Post-procedure details:    Patient status:  Awake   Patient tolerance of procedure:  Tolerated well, no immediate complications .Critical Care  Performed by: Pricilla Loveless, MD Authorized by: Pricilla Loveless, MD   Critical care provider statement:    Critical care time (minutes):  30   Critical care time was exclusive of:  Separately billable procedures and treating other patients   Critical care was necessary to treat or prevent imminent or life-threatening deterioration of the following conditions:  Cardiac failure and circulatory failure   Critical care was time  spent personally by me on the following activities:  Development of treatment plan with patient or surrogate, discussions with consultants, evaluation of patient's response to treatment, examination of patient, ordering and review  of laboratory studies, ordering and review of radiographic studies, ordering and performing treatments and interventions, pulse oximetry, re-evaluation of patient's condition and review of old charts     Medications Ordered in ED Medications  lactated ringers bolus 1,000 mL (0 mLs Intravenous Stopped 11/10/22 1834)  propofol (DIPRIVAN) 10 mg/mL bolus/IV push 53.3 mg (53.3 mg Intravenous Given 11/10/22 1929)    ED Course/ Medical Decision Making/ A&P                             Medical Decision Making Amount and/or Complexity of Data Reviewed Labs: ordered.    Details: Mild bump in creatinine from most recent baseline of 1.7-2.0.  Elevated BUN.  No hypokalemia or hypomagnesemia. Radiology: ordered and independent interpretation performed.    Details: No CHF. ECG/medicine tests: ordered and independent interpretation performed.    Details: Atrial fibrillation with RVR, later in sinus rhythm.   Patient presents with recurrent A-fib.  At this time he is in RVR though no signs of circulatory collapse or cardiogenic shock.  He is well-appearing.  Given that this has both been less than 48 hours and he is compliant with his Eliquis, I think he is a reasonable cardioversion candidate.  He prefers this route of treatment.  He is not often in A-fib.  There was a delay in lab work results from the lab but otherwise his labs are overall unrevealing besides some mild bump in his kidney function.  This is likely from his recent diarrhea and he was given IV fluids.  His electrolytes are overall unremarkable.  Sedation and cardioversion was successful.  He now feels completely better and has woken up appropriately and is stable for discharge home.  Will have him continue his home  meds and follow-up with A-fib clinic.  No anginal symptoms.  CHA2DS2/VAS Stroke Risk Points  Current as of 45 minutes ago     4 >= 2 Points: High Risk  1 - 1.99 Points: Medium Risk  0 Points: Low Risk    No Change      Details    This score determines the patient's risk of having a stroke if the  patient has atrial fibrillation.       Points Metrics  0 Has Congestive Heart Failure:  No    Current as of 45 minutes ago  0 Has Vascular Disease:  No    Current as of 45 minutes ago  1 Has Hypertension:  Yes    Current as of 45 minutes ago  1 Age:  13    Current as of 45 minutes ago  0 Has Diabetes:  No    Current as of 45 minutes ago  2 Had Stroke:  Yes  Had TIA:  No  Had Thromboembolism:  Yes    Current as of 45 minutes ago  0 Male:  No    Current as of 45 minutes ago                  Final Clinical Impression(s) / ED Diagnoses Final diagnoses:  Paroxysmal atrial fibrillation    Rx / DC Orders ED Discharge Orders          Ordered    Amb Referral to AFIB Clinic        11/10/22 2035              Pricilla Loveless, MD 11/10/22 2136

## 2022-11-10 NOTE — Discharge Instructions (Signed)
Continue to take your medications as prescribed including her Eliquis.  Take your Eliquis tonight and follow-up with the cardiology group.  If you develop headache, strokelike symptoms, chest pain, shortness of breath, recurrent A-fib, or any other new/concerning symptoms then return to the ER or call 911.

## 2022-11-10 NOTE — ED Notes (Signed)
Pt alert, NAD, calm, interactive, skin W&D, resps e/u, speaking in clear complete sentences, VSS, afib HR 144 on monitor. Denies pain, sob, or nausea. Endorses dizzy/ light headed, and fatigued with standing.

## 2022-11-10 NOTE — Telephone Encounter (Signed)
Pt continues in AF. Will bring in for assessment.

## 2022-11-10 NOTE — Progress Notes (Signed)
Primary Care Physician: Lorenda Ishihara, MD Primary Cardiologist: None Primary Electrophysiologist: Dr. Lalla Brothers (formerly Dr. 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 Johney Frame), HTN, Arthritis, and h/o PE who presents for consultation in the [facility]. Patient is on Eliquis 5 mg BID for a CHADS2VASC score of 2.  On follow up today, he is currently in Afib. Most recently seen by Otilio Saber, PA-C, on 08/31/22 and was in SR at [facility]. Last episode of Afib several years ago. Current episode began yesterday at around 1 pm. He has taken PRN Lopressor 25 mg q 8 hr. He still has chronic fatigue. Last dose of metoprolol 25 mg was at 8 am this morning. He last ate around 10/11 AM.   He is compliant with anticoagulation and has not missed any doses. He has no bleeding concerns.  Today, he denies symptoms of chest pain, orthopnea, PND, lower extremity edema, dizziness, presyncope, syncope, snoring, daytime somnolence, bleeding, or neurologic sequela. The patient is tolerating medications without difficulties and is otherwise without complaint today.   Atrial Fibrillation Risk Factors:  he does have symptoms or diagnosis of sleep apnea. he is compliant with CPAP therapy. he does not have a history of rheumatic fever. he does not have a history of alcohol use. The patient does not have a history of early familial atrial fibrillation or other arrhythmias.  he has a BMI of Body mass index is 31.84 kg/m.Marland Kitchen Filed Weights   11/10/22 1325  Weight: 106.5 kg    Family History  Problem Relation Age of Onset   Atrial fibrillation Sister    Atrial fibrillation Other        sister has had 2 afib ablations   Alzheimer's disease Neg Hx    Dementia Neg Hx     Atrial Fibrillation Management history:  Previous antiarrhythmic drugs: Tikosyn and  flecainide Previous cardioversions: Multiple, around 10 per patient Previous ablations: 2012 and 2014 Anticoagulation history: Eliquis 5 mg BID   Past Medical History:  Diagnosis Date   Adrenal cortical hyperfunction    ongoing evaluation for elevated R>L adosterone secretion   Arthritis    Atrial enlargement, bilateral    Atrial fibrillation    paroxysmal   Atrial flutter    Complication of anesthesia    could not urinate after UHR, needed cath, mental fog sinc cervical fusion 08-09-16    Dysrhythmia    a-fib-had ablation x2   GERD (gastroesophageal reflux disease)    Hyperaldosteronism    Hypertension    Kidney cysts    "multiple"   Nephrolithiasis    Pneumonia 1990's   Pre-diabetes    Prostatitis    Renal insufficiency    "Cr 1.6"   Past Surgical History:  Procedure Laterality Date   ABLATION     ATRIAL FIBRILLATION ABLATION N/A 09/28/2012   Procedure: ATRIAL FIBRILLATION ABLATION;  Surgeon: Hillis Range, MD;  Location: [facility] CATH LAB;  Service: Cardiovascular;  Laterality: N/A;   atrial fibrillation ablation with CTI ablation  05/27/11, 09/28/12   ablation for afib and atrial flutter by Dr Allyne Gee SURGERY  08/09/2016   C 5 6  C 6 to C 7 cervical fusion   CARDIOVERSION  07/28/2011   Procedure: CARDIOVERSION;  Surgeon: Lewayne Bunting, MD;  Location: [facility] OR;  Service: Cardiovascular;  Laterality: N/A;  COLONOSCOPY WITH PROPOFOL N/A 03/03/2015   Procedure: COLONOSCOPY WITH PROPOFOL;  Surgeon: Charolett Bumpers, MD;  Location: [facility] ENDOSCOPY;  Service: Gastroenterology;  Laterality: N/A;   double j stent placement  06/2004   left   ESOPHAGOGASTRODUODENOSCOPY (EGD) WITH PROPOFOL N/A 03/03/2015   Procedure: ESOPHAGOGASTRODUODENOSCOPY (EGD) WITH PROPOFOL;  Surgeon: Charolett Bumpers, MD;  Location: [facility] ENDOSCOPY;  Service: Gastroenterology;  Laterality: N/A;   ESOPHAGOGASTRODUODENOSCOPY (EGD) WITH PROPOFOL N/A 10/25/2016   Procedure: ESOPHAGOGASTRODUODENOSCOPY (EGD) WITH PROPOFOL;   Surgeon: Charolett Bumpers, MD;  Location: [facility] ENDOSCOPY;  Service: Endoscopy;  Laterality: N/A;   INGUINAL HERNIA REPAIR     "as a child; ? right"   INGUINAL HERNIA REPAIR Left 06/23/2016   Procedure: OPEN LEFT INGUINAL HERNIA  REPAIR WITH MESH;  Surgeon: Luretha Murphy, MD;  Location: [facility];  Service: General;  Laterality: Left;   INSERTION OF MESH Left 06/23/2016   Procedure: INSERTION OF MESH;  Surgeon: Luretha Murphy, MD;  Location: [facility];  Service: General;  Laterality: Left;   LEFT HEART CATH AND CORONARY ANGIOGRAPHY N/A 07/25/2018   Procedure: LEFT HEART CATH AND CORONARY ANGIOGRAPHY;  Surgeon: Lyn Records, MD;  Location: [facility] INVASIVE CV LAB;  Service: Cardiovascular;  Laterality: N/A;   LUNG BIOPSY  1993   NASAL SEPTOPLASTY W/ TURBINOPLASTY  07/2006   prostate biopsy  2011   schwannoma removal  ~01/2010   left thorax   TEE WITHOUT CARDIOVERSION N/A 09/27/2012   Procedure: TRANSESOPHAGEAL ECHOCARDIOGRAM (TEE);  Surgeon: Laurey Morale, MD;  Location: [facility] ENDOSCOPY;  Service: Cardiovascular;  Laterality: N/A;   ULTRASOUND GUIDANCE FOR VASCULAR ACCESS  07/25/2018   Procedure: Ultrasound Guidance For Vascular Access;  Surgeon: Lyn Records, MD;  Location: [facility] INVASIVE CV LAB;  Service: Cardiovascular;;   UMBILICAL HERNIA REPAIR  07/2006    Current Outpatient Medications  Medication Sig Dispense Refill   allopurinol (ZYLOPRIM) 100 MG tablet Take 100 mg by mouth 2 (two) times daily.     apixaban (ELIQUIS) 5 MG TABS tablet Take 1 tablet (5 mg total) by mouth 2 (two) times daily. 180 tablet 3   Ascorbic Acid (VITAMIN C) 1000 MG tablet Take 1,000 mg by mouth 3 (three) times a week.     atorvastatin (LIPITOR) 10 MG tablet Take 10 mg by mouth every evening.      CIALIS 20 MG tablet Take 20 mg by mouth daily as needed for erectile dysfunction.   11   diltiazem (CARDIZEM CD) 360 MG 24 hr capsule Take 360 mg by mouth every evening.  7   eplerenone (INSPRA)  50 MG tablet Take 1 tablet (50 mg total) by mouth 2 (two) times daily. 180 tablet 3   Glucosamine 500 MG CAPS 1 capsule with a meal Orally twice a day for 30 days     hydrochlorothiazide (MICROZIDE) 12.5 MG capsule Take 12.5 mg by mouth daily.     Inulin 2 g CHEW Taking 2 gummies by mouth daily     losartan (COZAAR) 100 MG tablet Take 100 mg by mouth daily.     metFORMIN (GLUCOPHAGE) 500 MG tablet Take 500 mg by mouth every morning.     metoprolol tartrate (LOPRESSOR) 25 MG tablet Take 1 tablet by mouth every 8 hours as needed for afib HR over 100 30 tablet 1   montelukast (SINGULAIR) 10 MG tablet TAKE 1 TABLET(10 MG) BY MOUTH AT BEDTIME 30 tablet 11   Multiple Vitamin (MULTIVITAMIN) capsule Take 1 capsule  by mouth 3 (three) times a week.     Multiple Vitamins-Minerals (ZINC PO) Take 1 tablet by mouth as needed.     MYRBETRIQ 50 MG TB24 tablet Take 50 mg by mouth daily.     omeprazole (PRILOSEC) 40 MG capsule Take 40 mg by mouth daily before supper.      potassium chloride SA (KLOR-CON M) 20 MEQ tablet Take 2 tablets (40 meq) by mouth twice today (at least 2 hours apart) for a total of 80 meq and then start one tablet (20 meq) once daily thereafter. (Patient taking differently: Taking two tablets by mouth daily) 100 tablet 3   PROAIR HFA 108 (90 Base) MCG/ACT inhaler SMARTSIG:2 Inhalation Via Inhaler Every 6 Hours PRN     scopolamine (TRANSDERM-SCOP) 1 MG/3DAYS Place 1 patch onto the skin as needed.     sertraline (ZOLOFT) 100 MG tablet Take 100 mg by mouth daily.     tamsulosin (FLOMAX) 0.4 MG CAPS capsule Take 0.4 mg by mouth every morning.     vitamin B-12 (CYANOCOBALAMIN) 1000 MCG tablet Take 1,000 mcg by mouth daily.     XIGDUO XR 11-998 MG TB24 Take 1 tablet by mouth daily.     No current facility-administered medications for this encounter.    Allergies  Allergen Reactions   Aldactone [Spironolactone]     Breast tissue enlargement   Codeine Other (See Comments)    Headache     Keflex [Cephalexin] Diarrhea   Lisinopril Cough   Nexium [Esomeprazole Magnesium] Diarrhea   Sulfa Antibiotics Rash   Sulfa Drugs Cross Reactors Rash    Social History   Socioeconomic History   Marital status: Married    Spouse name: Not on file   Number of children: Not on file   Years of education: Not on file   Highest education level: Not on file  Occupational History   Not on file  Tobacco Use   Smoking status: Never   Smokeless tobacco: Never  Vaping Use   Vaping Use: Never used  Substance and Sexual Activity   Alcohol use: Not Currently    Alcohol/week: 4.0 standard drinks of alcohol    Types: 4 Cans of beer per week    Comment: occasional, 05-11-2016 Per pt 3 drinks per wk   Drug use: No    Comment: 05-11-2016 per pt no   Sexual activity: Yes  Other Topics Concern   Not on file  Social History Narrative   ** Merged History Encounter **       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   Social Determinants of Health   Financial Resource Strain: Not on file  Food Insecurity: Not on file  Transportation Needs: Not on file  Physical Activity: Sufficiently Active (03/14/2019)   Exercise Vital Sign    Days of Exercise per Week: 7 days    Minutes of Exercise per Session: 40 min  Stress: No Stress Concern Present (03/14/2019)   [employer] of Occupational Health - Occupational Stress Questionnaire    Feeling of Stress : Not at all  Social Connections: Unknown (03/14/2019)   Social Connection and Isolation Panel [NHANES]    Frequency of Communication with Friends and Family: Patient declined    Frequency of Social Gatherings with Friends and Family: Patient declined    Attends Religious Services: Patient declined    Database administrator or Organizations: Patient declined    Attends Banker Meetings: Patient declined    Marital Status: Married  Catering manager  Violence: Not At Risk (03/14/2019)   Humiliation, Afraid, Rape, and Kick questionnaire    Fear of  Current or Ex-Partner: No    Emotionally Abused: No    Physically Abused: No    Sexually Abused: No    ROS- All systems are reviewed and negative except as per the HPI above.  Physical Exam: Vitals:   11/10/22 1325  BP: (!) 140/88  Pulse: (!) 143  Weight: 106.5 kg  Height: 6' (1.829 m)    GEN- The patient is well appearing, alert and oriented x 3 today.   Head- normocephalic, atraumatic Eyes-  Sclera clear, conjunctiva pink Ears- hearing intact Oropharynx- clear Neck- supple, no JVP Lymph- no cervical lymphadenopathy Lungs- Clear to ausculation bilaterally, normal work of breathing Heart- Irregular tachycardic rate and rhythm, no murmurs, rubs or gallops, PMI not laterally displaced GI- soft, NT, ND, + BS Extremities- no clubbing, cyanosis, or edema MS- no significant deformity or atrophy Skin- no rash or lesion Psych- euthymic mood, full affect Neuro- strength and sensation are intact   Wt Readings from Last 3 Encounters:  11/10/22 106.5 kg  11/01/22 108.4 kg  08/31/22 109.6 kg   EKG today demonstrates  Vent. rate 143 BPM PR interval * ms QRS duration 94 ms QT/QTcB 326/503 ms P-R-T axes * -59 29 Atrial fibrillation with rapid ventricular response with premature ventricular or aberrantly conducted complexes Left axis deviation Inferior infarct , age undetermined Possible Anterolateral infarct , age undetermined Abnormal ECG When compared with ECG of 14-Mar-2019 09:33, PREVIOUS ECG IS PRESENT   Echo 10/05/22 demonstrated:  1. Left ventricular ejection fraction, by estimation, is 60 to 65%. The  left ventricle has normal function. The left ventricle has no regional  wall motion abnormalities. Left ventricular diastolic parameters are  consistent with Grade I diastolic  dysfunction (impaired relaxation).   2. Right ventricular systolic function is normal. The right ventricular  size is normal. There is normal pulmonary artery systolic pressure.   3. Left  atrial size was moderately dilated.   4. The mitral valve is normal in structure. No evidence of mitral valve  regurgitation. No evidence of mitral stenosis.   5. The aortic valve is tricuspid. There is mild calcification of the  aortic valve. There is mild thickening of the aortic valve. Aortic valve  regurgitation is trivial. Aortic valve sclerosis is present, with no  evidence of aortic valve stenosis.   6. Aortic dilatation noted. There is moderate dilatation of the ascending  aorta, measuring 43 mm.   7. The inferior vena cava is normal in size with greater than 50%  respiratory variability, suggesting right atrial pressure of 3 mmHg.  Epic records are reviewed at length today.  CHA2DS2-VASc Score = 2  The patient's score is based upon: CHF History: 0 HTN History: 1 Diabetes History: 0 Stroke History: 0 Vascular Disease History: 0 Age Score: 1 Gender Score: 0       ASSESSMENT AND PLAN: Paroxysmal Atrial Fibrillation (ICD10:  I48.0) The patient's CHA2DS2-VASc score is 2, indicating a 2.2% annual risk of stroke.    He is currently in Afib with RVR. He is symptomatic at this time and next available outpatient cardioversion is in ~2 weeks. He feels he cannot wait that long and after discussion with patient and his wife agree that ED evaluation for consideration of cardioversion is reasonable given acute presentation.   Last ate 10/11 AM.   2. Secondary Hypercoagulable State (ICD10:  D68.69) The patient is at significant risk  for stroke/thromboembolism based upon his CHA2DS2-VASc Score of 2.  Continue Apixaban (Eliquis).  No missed doses.  3. Obesity Body mass index is 31.84 kg/m. Lifestyle modification was discussed at length including regular exercise and weight reduction. Continue walking dog 2-3 miles as tolerated.   4. Obstructive sleep apnea The importance of adequate treatment of sleep apnea was discussed today in order to improve our ability to maintain sinus  rhythm long term. He is compliant with CPAP.  5. HTN Elevated today while in Afib. Continue to check at home.    He was taken in wheelchair to ED from clinic.     Lake Bells, PA-C Afib Clinic [facility] [address] [place], [place] [address] [phone] 11/10/2022 2:05 PM

## 2022-11-10 NOTE — Patient Instructions (Signed)
Cardioversion scheduled for: Monday. April 29th   - Arrive at the [facility] and go to admitting at 7am   - Do not eat or drink anything after midnight the night prior to your procedure.   - Take all your morning medication (except diabetic medications) with a sip of water prior to arrival.  - You will not be able to drive home after your procedure.    - Do NOT miss any doses of your blood thinner - if you should miss a dose please notify our office immediately.   - If you feel as if you go back into normal rhythm prior to scheduled cardioversion, please notify our office immediately.   If your procedure is canceled in the cardioversion suite you will be charged a cancellation fee.

## 2022-11-10 NOTE — ED Triage Notes (Addendum)
Patient here for evaluation of feeling like he is atrial fibrillation, history of two ablations. Complains of fatigue and lightheadedness. Patient takes cardizem, metoprolol, and eliquis. Patient denies chest pain, denies shortness of breath, is alert, oriented, ambulating independently with steady gait, and is in no apparent distress at this time.

## 2022-11-11 ENCOUNTER — Telehealth: Payer: Self-pay | Admitting: Cardiology

## 2022-11-11 NOTE — Telephone Encounter (Signed)
Patient had to go to the ER on yesterday, and was told he needed to be seen bout our office. [place] going [place] the [place] 4[place] and wanted to be seen before then. Please advise

## 2022-11-11 NOTE — Telephone Encounter (Signed)
It looks like the ER referred patient back to A. FIB clinic, who sent patient to the ER to begin with. Will forward message to their clinic to follow-up.

## 2022-11-16 ENCOUNTER — Ambulatory Visit (HOSPITAL_COMMUNITY)
Admission: RE | Admit: 2022-11-16 | Discharge: 2022-11-16 | Disposition: A | Discharge status: Home / Self Care | Payer: Medicare Other | Source: Ambulatory Visit | Attending: Internal Medicine | Admitting: Internal Medicine

## 2022-11-16 VITALS — BP 152/92 | HR 76 | Ht 72.0 in | Wt 235.8 lb

## 2022-11-16 DIAGNOSIS — Z7182 Exercise counseling: Secondary | ICD-10-CM | POA: Diagnosis not present

## 2022-11-16 DIAGNOSIS — Z6831 Body mass index (BMI) 31.0-31.9, adult: Secondary | ICD-10-CM | POA: Diagnosis not present

## 2022-11-16 DIAGNOSIS — Z86711 Personal history of pulmonary embolism: Secondary | ICD-10-CM | POA: Diagnosis not present

## 2022-11-16 DIAGNOSIS — I4892 Unspecified atrial flutter: Secondary | ICD-10-CM | POA: Diagnosis not present

## 2022-11-16 DIAGNOSIS — Z7901 Long term (current) use of anticoagulants: Secondary | ICD-10-CM | POA: Diagnosis not present

## 2022-11-16 DIAGNOSIS — G4733 Obstructive sleep apnea (adult) (pediatric): Secondary | ICD-10-CM | POA: Diagnosis not present

## 2022-11-16 DIAGNOSIS — E669 Obesity, unspecified: Secondary | ICD-10-CM | POA: Diagnosis not present

## 2022-11-16 DIAGNOSIS — I480 Paroxysmal atrial fibrillation: Secondary | ICD-10-CM | POA: Insufficient documentation

## 2022-11-16 DIAGNOSIS — I10 Essential (primary) hypertension: Secondary | ICD-10-CM | POA: Diagnosis not present

## 2022-11-16 DIAGNOSIS — D6869 Other thrombophilia: Secondary | ICD-10-CM | POA: Insufficient documentation

## 2022-11-16 DIAGNOSIS — Z79899 Other long term (current) drug therapy: Secondary | ICD-10-CM | POA: Insufficient documentation

## 2022-11-16 NOTE — Progress Notes (Signed)
Primary Care Physician: Lorenda Ishihara, MD Primary Cardiologist: None Primary Electrophysiologist: Dr. Lalla Brothers (formerly Dr. 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 Johney Frame), HTN, Arthritis, and h/o PE who presents for consultation in the [facility]. Patient is on Eliquis 5 mg BID for a CHADS2VASC score of 2.  On follow up 4/17, he is currently in Afib. Most recently seen by Otilio Saber, PA-C, on 08/31/22 and was in SR at [facility]. Last episode of Afib several years ago. Current episode began yesterday at around 1 pm. He has taken PRN Lopressor 25 mg q 8 hr. He still has chronic fatigue. Last dose of metoprolol 25 mg was at 8 am this morning. He last ate around 10/11 AM.   He is compliant with anticoagulation and has not missed any doses. He has no bleeding concerns.  On follow up today, he is currently in SR today. He is s/p DCCV on 4/17 after going to the ED following our recent visit the same day. He feels well overall. No missed doses of anticoagulation.   Today, he denies symptoms of chest pain, orthopnea, PND, lower extremity edema, dizziness, presyncope, syncope, snoring, daytime somnolence, bleeding, or neurologic sequela. The patient is tolerating medications without difficulties and is otherwise without complaint today.   Atrial Fibrillation Risk Factors:  he does have symptoms or diagnosis of sleep apnea. he is compliant with CPAP therapy. he does not have a history of rheumatic fever. he does not have a history of alcohol use. The patient does not have a history of early familial atrial fibrillation or other arrhythmias.  he has a BMI of Body mass index is 31.98 kg/m.Marland Kitchen Filed Weights   11/16/22 1138  Weight: 107 kg     Family History  Problem Relation Age of Onset   Atrial fibrillation Sister    Atrial fibrillation Other         sister has had 2 afib ablations   Alzheimer's disease Neg Hx    Dementia Neg Hx     Atrial Fibrillation Management history:  Previous antiarrhythmic drugs: Tikosyn and flecainide Previous cardioversions: Multiple, around 10 per patient Previous ablations: 2012 and 2014 Anticoagulation history: Eliquis 5 mg BID   Past Medical History:  Diagnosis Date   Adrenal cortical hyperfunction    ongoing evaluation for elevated R>L adosterone secretion   Arthritis    Atrial enlargement, bilateral    Atrial fibrillation    paroxysmal   Atrial flutter    Complication of anesthesia    could not urinate after UHR, needed cath, mental fog sinc cervical fusion 08-09-16    Dysrhythmia    a-fib-had ablation x2   GERD (gastroesophageal reflux disease)    Hyperaldosteronism    Hypertension    Kidney cysts    "multiple"   Nephrolithiasis    Pneumonia 1990's   Pre-diabetes    Prostatitis    Renal insufficiency    "Cr 1.6"   Past Surgical History:  Procedure Laterality Date   ABLATION     ATRIAL FIBRILLATION ABLATION N/A 09/28/2012   Procedure: ATRIAL FIBRILLATION ABLATION;  Surgeon: Hillis Range, MD;  Location: [facility] CATH LAB;  Service: Cardiovascular;  Laterality: N/A;   atrial fibrillation ablation with CTI ablation  05/27/11, 09/28/12   ablation for afib and atrial flutter by Dr Allyne Gee SURGERY  08/09/2016  C 5 6  C 6 to C 7 cervical fusion   CARDIOVERSION  07/28/2011   Procedure: CARDIOVERSION;  Surgeon: Lewayne Bunting, MD;  Location: [facility] OR;  Service: Cardiovascular;  Laterality: N/A;   COLONOSCOPY WITH PROPOFOL N/A 03/03/2015   Procedure: COLONOSCOPY WITH PROPOFOL;  Surgeon: Charolett Bumpers, MD;  Location: [facility] ENDOSCOPY;  Service: Gastroenterology;  Laterality: N/A;   double j stent placement  06/2004   left   ESOPHAGOGASTRODUODENOSCOPY (EGD) WITH PROPOFOL N/A 03/03/2015   Procedure: ESOPHAGOGASTRODUODENOSCOPY (EGD) WITH PROPOFOL;  Surgeon: Charolett Bumpers, MD;  Location: [facility]  ENDOSCOPY;  Service: Gastroenterology;  Laterality: N/A;   ESOPHAGOGASTRODUODENOSCOPY (EGD) WITH PROPOFOL N/A 10/25/2016   Procedure: ESOPHAGOGASTRODUODENOSCOPY (EGD) WITH PROPOFOL;  Surgeon: Charolett Bumpers, MD;  Location: [facility] ENDOSCOPY;  Service: Endoscopy;  Laterality: N/A;   INGUINAL HERNIA REPAIR     "as a child; ? right"   INGUINAL HERNIA REPAIR Left 06/23/2016   Procedure: OPEN LEFT INGUINAL HERNIA  REPAIR WITH MESH;  Surgeon: Luretha Murphy, MD;  Location: [facility];  Service: General;  Laterality: Left;   INSERTION OF MESH Left 06/23/2016   Procedure: INSERTION OF MESH;  Surgeon: Luretha Murphy, MD;  Location: [facility];  Service: General;  Laterality: Left;   LEFT HEART CATH AND CORONARY ANGIOGRAPHY N/A 07/25/2018   Procedure: LEFT HEART CATH AND CORONARY ANGIOGRAPHY;  Surgeon: Lyn Records, MD;  Location: [facility] INVASIVE CV LAB;  Service: Cardiovascular;  Laterality: N/A;   LUNG BIOPSY  1993   NASAL SEPTOPLASTY W/ TURBINOPLASTY  07/2006   prostate biopsy  2011   schwannoma removal  ~01/2010   left thorax   TEE WITHOUT CARDIOVERSION N/A 09/27/2012   Procedure: TRANSESOPHAGEAL ECHOCARDIOGRAM (TEE);  Surgeon: Laurey Morale, MD;  Location: [facility] ENDOSCOPY;  Service: Cardiovascular;  Laterality: N/A;   ULTRASOUND GUIDANCE FOR VASCULAR ACCESS  07/25/2018   Procedure: Ultrasound Guidance For Vascular Access;  Surgeon: Lyn Records, MD;  Location: [facility] INVASIVE CV LAB;  Service: Cardiovascular;;   UMBILICAL HERNIA REPAIR  07/2006    Current Outpatient Medications  Medication Sig Dispense Refill   allopurinol (ZYLOPRIM) 100 MG tablet Take 100 mg by mouth 2 (two) times daily.     apixaban (ELIQUIS) 5 MG TABS tablet Take 1 tablet (5 mg total) by mouth 2 (two) times daily. 180 tablet 3   Ascorbic Acid (VITAMIN C) 1000 MG tablet Take 1,000 mg by mouth 3 (three) times a week.     atorvastatin (LIPITOR) 10 MG tablet Take 10 mg by mouth every evening.      CIALIS 20 MG  tablet Take 20 mg by mouth daily as needed for erectile dysfunction.   11   diltiazem (CARDIZEM CD) 360 MG 24 hr capsule Take 360 mg by mouth every evening.  7   eplerenone (INSPRA) 50 MG tablet Take 1 tablet (50 mg total) by mouth 2 (two) times daily. 180 tablet 3   FIBER PO Taking 2 gummy by mouth daily     Glucosamine 500 MG CAPS 1 capsule with a meal Orally twice a day for 30 days     hydrochlorothiazide (MICROZIDE) 12.5 MG capsule Take 12.5 mg by mouth daily.     losartan (COZAAR) 100 MG tablet Take 100 mg by mouth daily.     metFORMIN (GLUCOPHAGE) 500 MG tablet Take 500 mg by mouth every morning.     metoprolol tartrate (LOPRESSOR) 25 MG tablet Take 1 tablet by mouth every 8 hours as  needed for afib HR over 100 30 tablet 1   montelukast (SINGULAIR) 10 MG tablet TAKE 1 TABLET(10 MG) BY MOUTH AT BEDTIME 30 tablet 11   Multiple Vitamin (MULTIVITAMIN) capsule Take 1 capsule by mouth 3 (three) times a week.     Multiple Vitamins-Minerals (ZINC PO) Take 1 tablet by mouth as needed.     MYRBETRIQ 50 MG TB24 tablet Take 50 mg by mouth daily.     omeprazole (PRILOSEC) 40 MG capsule Take 40 mg by mouth daily before supper.      potassium chloride SA (KLOR-CON M) 20 MEQ tablet Take 2 tablets (40 meq) by mouth twice today (at least 2 hours apart) for a total of 80 meq and then start one tablet (20 meq) once daily thereafter. (Patient taking differently: Taking two tablets by mouth daily) 100 tablet 3   PROAIR HFA 108 (90 Base) MCG/ACT inhaler SMARTSIG:2 Inhalation Via Inhaler Every 6 Hours PRN     scopolamine (TRANSDERM-SCOP) 1 MG/3DAYS Place 1 patch onto the skin as needed.     sertraline (ZOLOFT) 100 MG tablet Take 100 mg by mouth daily.     tamsulosin (FLOMAX) 0.4 MG CAPS capsule Take 0.4 mg by mouth every morning.     vitamin B-12 (CYANOCOBALAMIN) 1000 MCG tablet Take 1,000 mcg by mouth daily.     XIGDUO XR 11-998 MG TB24 Take 1 tablet by mouth daily.     No current facility-administered  medications for this encounter.    Allergies  Allergen Reactions   Aldactone [Spironolactone]     Breast tissue enlargement   Codeine Other (See Comments)    Headache    Keflex [Cephalexin] Diarrhea   Lisinopril Cough   Nexium [Esomeprazole Magnesium] Diarrhea   Sulfa Antibiotics Rash   Sulfa Drugs Cross Reactors Rash    Social History   Socioeconomic History   Marital status: Married    Spouse name: Not on file   Number of children: Not on file   Years of education: Not on file   Highest education level: Not on file  Occupational History   Not on file  Tobacco Use   Smoking status: Never   Smokeless tobacco: Never  Vaping Use   Vaping Use: Never used  Substance and Sexual Activity   Alcohol use: Not Currently    Alcohol/week: 4.0 standard drinks of alcohol    Types: 4 Cans of beer per week    Comment: occasional, 05-11-2016 Per pt 3 drinks per wk   Drug use: No    Comment: 05-11-2016 per pt no   Sexual activity: Yes  Other Topics Concern   Not on file  Social History Narrative   ** Merged History Encounter **       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   Social Determinants of Health   Financial Resource Strain: Not on file  Food Insecurity: Not on file  Transportation Needs: Not on file  Physical Activity: Sufficiently Active (03/14/2019)   Exercise Vital Sign    Days of Exercise per Week: 7 days    Minutes of Exercise per Session: 40 min  Stress: No Stress Concern Present (03/14/2019)   [employer] of Occupational Health - Occupational Stress Questionnaire    Feeling of Stress : Not at all  Social Connections: Unknown (03/14/2019)   Social Connection and Isolation Panel [NHANES]    Frequency of Communication with Friends and Family: Patient declined    Frequency of Social Gatherings with Friends and Family: Patient declined  Attends Religious Services: Patient declined    Active Member of Clubs or Organizations: Patient declined    Attends Tax inspector Meetings: Patient declined    Marital Status: Married  Catering manager Violence: Not At Risk (03/14/2019)   Humiliation, Afraid, Rape, and Kick questionnaire    Fear of Current or Ex-Partner: No    Emotionally Abused: No    Physically Abused: No    Sexually Abused: No    ROS- All systems are reviewed and negative except as per the HPI above.  Physical Exam: Vitals:   11/16/22 1138  BP: (!) 152/92  Pulse: 76  Weight: 107 kg  Height: 6' (1.829 m)    GEN- The patient is well appearing, alert and oriented x 3 today.   Head- normocephalic, atraumatic Eyes-  Sclera clear, conjunctiva pink Ears- hearing intact Oropharynx- clear Neck- supple, no JVP Lymph- no cervical lymphadenopathy Lungs- Clear to ausculation bilaterally, normal work of breathing Heart- Regular rate and rhythm, no murmurs, rubs or gallops, PMI not laterally displaced GI- soft, NT, ND, + BS Extremities- no clubbing, cyanosis, or edema MS- no significant deformity or atrophy Skin- no rash or lesion Psych- euthymic mood, full affect Neuro- strength and sensation are intact   Wt Readings from Last 3 Encounters:  11/16/22 107 kg  11/10/22 106.5 kg  11/10/22 106.5 kg   EKG today demonstrates  Vent. rate 76 BPM PR interval 228 ms QRS duration 114 ms QT/QTcB 412/463 ms P-R-T axes 7 -53 13 Sinus rhythm with 1st degree A-V block with occasional Premature ventricular complexes Left axis deviation Minimal voltage criteria for LVH, may be normal variant ( R in aVL ) Inferior infarct , age undetermined Anterolateral infarct , age undetermined Abnormal ECG When compared with ECG of 10-Nov-2022 19:53, PREVIOUS ECG IS PRESENT  Echo 10/05/22 demonstrated:  1. Left ventricular ejection fraction, by estimation, is 60 to 65%. The  left ventricle has normal function. The left ventricle has no regional  wall motion abnormalities. Left ventricular diastolic parameters are  consistent with Grade I diastolic   dysfunction (impaired relaxation).   2. Right ventricular systolic function is normal. The right ventricular  size is normal. There is normal pulmonary artery systolic pressure.   3. Left atrial size was moderately dilated.   4. The mitral valve is normal in structure. No evidence of mitral valve  regurgitation. No evidence of mitral stenosis.   5. The aortic valve is tricuspid. There is mild calcification of the  aortic valve. There is mild thickening of the aortic valve. Aortic valve  regurgitation is trivial. Aortic valve sclerosis is present, with no  evidence of aortic valve stenosis.   6. Aortic dilatation noted. There is moderate dilatation of the ascending  aorta, measuring 43 mm.   7. The inferior vena cava is normal in size with greater than 50%  respiratory variability, suggesting right atrial pressure of 3 mmHg.  Epic records are reviewed at length today.  CHA2DS2-VASc Score = 2  The patient's score is based upon: CHF History: 0 HTN History: 1 Diabetes History: 0 Stroke History: 0 Vascular Disease History: 0 Age Score: 1 Gender Score: 0       ASSESSMENT AND PLAN: Paroxysmal Atrial Fibrillation (ICD10:  I48.0) The patient's CHA2DS2-VASc score is 2, indicating a 2.2% annual risk of stroke.    S/p DCCV on 4/17.  He is currently in SR today. He feels well overall.   Will not make any medication changes at this point due  to overall infrequency of Afib episodes.    2. Secondary Hypercoagulable State (ICD10:  D68.69) The patient is at significant risk for stroke/thromboembolism based upon his CHA2DS2-VASc Score of 2.  Continue Apixaban (Eliquis).  No missed doses. Emphasis on compliance in the 4 week post DCCV period.  3. Obesity Body mass index is 31.98 kg/m. Lifestyle modification was discussed at length including regular exercise and weight reduction. Continue walking dog 2-3 miles as tolerated.   4. Obstructive sleep apnea The importance of adequate  treatment of sleep apnea was discussed today in order to improve our ability to maintain sinus rhythm long term. He is compliant with CPAP.  5. HTN Stable today, no changes.     F/u as scheduled with Dr. Lalla Brothers.    Lake Bells, PA-C Afib Clinic [facility] [address] [place], [place] [address] [phone] 11/16/2022 1:42 PM

## 2022-11-18 ENCOUNTER — Other Ambulatory Visit (HOSPITAL_COMMUNITY): Payer: Medicare Other | Admitting: Internal Medicine

## 2022-11-22 ENCOUNTER — Ambulatory Visit (HOSPITAL_COMMUNITY): Admit: 2022-11-22 | Payer: Medicare Other | Admitting: Cardiovascular Disease

## 2022-11-22 ENCOUNTER — Encounter (HOSPITAL_COMMUNITY): Payer: Self-pay

## 2022-11-22 SURGERY — CARDIOVERSION
Anesthesia: General

## 2023-02-24 ENCOUNTER — Ambulatory Visit: Payer: Medicare Other | Admitting: Cardiology

## 2023-03-17 ENCOUNTER — Encounter: Payer: Medicare Other | Attending: Psychology | Admitting: Psychology

## 2023-03-17 DIAGNOSIS — I63139 Cerebral infarction due to embolism of unspecified carotid artery: Secondary | ICD-10-CM | POA: Diagnosis present

## 2023-03-17 DIAGNOSIS — J9611 Chronic respiratory failure with hypoxia: Secondary | ICD-10-CM | POA: Diagnosis present

## 2023-03-17 DIAGNOSIS — R413 Other amnesia: Secondary | ICD-10-CM | POA: Insufficient documentation

## 2023-03-17 DIAGNOSIS — G4733 Obstructive sleep apnea (adult) (pediatric): Secondary | ICD-10-CM | POA: Diagnosis present

## 2023-03-17 NOTE — Progress Notes (Signed)
Neuropsychological Evaluation   Patient:  GREYDON BAQUET, MD   DOB: November 07, 1955  MR Number: [id number]  Location: [facility] [address] 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  The patient had previously been followed by [name] before transferring to [name] and was initially seen for headaches and mild memory concerns by [name].  He was followed by [name] since 2020.  Patient is also hard of hearing.  Patient continues to be social and interested in hobbies and continues to play tennis 1 night per week and travel with family and friends.  Serial MoCA testing has been stable.  No changes in geographic orientation noted.  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  Patient reports that his wife tells him that he is "not listening" to her and that he is either not hearing her well or forgetting what she says.  Purpose of the evaluation is to produce baseline objective assessment of cognitive variables and memory functions.  Patient reports that memory issues are primary difficulties and that he is also having some physical changes but is remaining quite active.  Patient reports that he feels like his  wife often catches him at inopportune times telling him things to do but admits that some of his lack of recall for what she is telling him may be due to him not paying attention as well.  Patient reports that memory difficulties are more likely to be semantic in nature rather than episodic memory difficulties.  Patient denies any word finding difficulties or targeted naming issues.  Patient reports that he is making more lists to avoid forgetting things and working on various strategies to try to improve his retention of information that his wife tells him.  Patient denies any geographic disorientation, tremors, visual hallucinations or delusions.  Patient also describes some mild changes in balance.   Patient's past medical history includes history of A-fib with prior pulmonary embolism and previous ablation interventions for his A-fib on 2 occasions.  Patient describes some shortness of breath and dyspnea on exertion since the pulmonary embolism.  Patient has remained on anticoagulation medications.  Patient began reporting difficulties with memory in 2020 but reports that these memory changes have been rather stable over time.  Patient has bilateral hearing aids.  Patient described an incidence of vertigo in 2022 upon wakening after having some nausea and vomiting the night prior.  Patient has had improvement in vertigo over time.  There is some mild gait changes noted by neurology but no falls.  No vision disturbance noted and there was no indications of focal numbness or weakness in arms, face or legs.  Patient has developed some mild slight numbness in his toes and he is borderline diabetic.  Complete background history and initial clinical interview report can be found in the patient's EMR dated 07/29/2022.   Medical History:                         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HCC)      ongoing evaluation for elevated R>L adosterone secretion   Arthritis      Atrial enlargement, bilateral     Atrial fibrillation (HCC)      paroxysmal   Atrial flutter (HCC)     Complication of anesthesia      could not urinate after UHR, needed cath, mental fog sinc cervical fusion 08-09-16    Dysrhythmia      a-fib-had ablation x2   GERD (gastroesophageal reflux disease)     Hyperaldosteronism (HCC)     Hypertension     Kidney cysts      "multiple"   Nephrolithiasis     Pneumonia 1990's   Pre-diabetes     Prostatitis     Renal insufficiency      "Cr 1.6"                                                               Patient Active Problem List    Diagnosis Date Noted   Trochanteric bursitis, left hip 02/22/2022   Moderate COPD (chronic obstructive pulmonary disease) (HCC) 03/26/2020   OSA (obstructive sleep apnea) 03/26/2020   Pulmonary emboli (HCC) 03/14/2019   Chronic respiratory failure with hypoxia (HCC) 08/18/2018   Hyperlipidemia 08/17/2018   Community acquired pneumonia of right lower lobe of lung 08/16/2018   CKD (chronic kidney disease), stage II 08/16/2018   Abnormal ECG 07/25/2018   Incontinence of feces 04/18/2018   Urge incontinence of urine 03/11/2018   Renal insufficiency     Prostatitis     Pre-diabetes     Pneumonia     Nephrolithiasis     Kidney cysts     Hyperaldosteronism (HCC)     GERD (gastroesophageal reflux disease)     Dysrhythmia     Complication of anesthesia     Atrial enlargement, bilateral     Arthritis     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HCC)     Inguinal hernia of left side without obstruction or gangrene Nov 2017 06/23/2016   Adjustment disorder with depressed mood 05/11/2016   Atrial fibrillation (HCC) 03/18/2011   Atrial flutter (HCC) 03/18/2011   Hypertension 03/18/2011      Onset and Duration of Symptoms: Patient began noting some difficulties with memory in 2020 but no changes in expressive language.  Some mild changes in coordination and gait have been noted.   Progression of Symptoms:  Patient reports that his attentional and memory issues have remained fairly stable since onset.   Additional Tests and Measures from other records:   Neuroimaging Results: Patient had an MRI head conducted on 03/2021 with comparisons to MRI conducted in December 2020.  Impressions indicated no evidence of recent infarction, hemorrhage or mass effect.  There was mild chronic microvascular ischemic changes and a tiny bilateral chronic cerebellar infarcts noted.   Laboratory Tests: Patient has had  consistent elevations in blood glucose levels over the past 3 years.  Patient's vitamin D lab work have been in the lower end of the average range.  B1 levels were within normal limits along with methylmalonic acid, B12 and folate.   Sleep: Patient has been diagnosed with obstructive sleep apnea and consistently and compliantly uses his CPAP device.  Patient reports that he sleeps like a rock.  Patient reports that he feels rested in the morning.   Diet Pattern: Patient describes a good appetite and a healthy diet.  Tests Administered: Controlled Oral Word Association Test (COWAT; FAS & Animals)  Wechsler Adult Intelligence Scale, 4th Edition (WAIS-IV) Wechsler Memory Scale, 4th Edition (WMS-IV); Adult Battery  Participation Level:   Active  Participation Quality:  Appropriate      Behavioral Observation:  The patient appeared well-groomed and appropriately dressed. His manners were polite and appropriate to the situation. The patient's attitude towards testing was positive and his effort was good.   Well Groomed, Alert, and Appropriate.   Test Results:   Initially, an estimation was made as to the patient's premorbid intellectual and cognitive abilities.  The patient is always been an excellent student and completed college and graduated from medical school always maintaining an excellent GPA.  The patient went to the [employer] for undergraduate and medical school and completed his  residency at the [employer].  Patient did very well in math and science type classes.  Patient was valedictorian of his high school and salutatorian when he graduated from the [employer].  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  Hobbies and interest have included playing the piano, basson, travel and playing tennis.  Taking psychosocial variables into account the patient is likely performed in the very superior range relative to a normative population and we will utilize the 90th-99th percentile for comparison purposes on measures that typically very within a common bell curve type distribution.  Considering validity of the current assessment, the patient appeared to try his hardest throughout these test measures and embedded validity checks also clearly suggested the patient tried his hardest throughout and this does appear to be a fair and valid assessment of the patient's current cognitive status.  COWAT:  FAS total=57  Z= 1.35 Animals total= 27  Z= 2   While the patient has not described any expressive or receptive language functions we did assess both semantic fluency and lexical fluency for the current evaluation.  On the FAS test which assesses lexical fluency the patient did very well performing nearly 1-1/2 standard deviations above normative comparison.  Patient performed even better on semantic fluency measures where he was given specific category 2 name performing 2 standard deviations above normative comparisons match for age, education and gender variables.  WAIS-IV:             Composite Score Summary          Scale Sum of Scaled Scores Composite Score Percentile Rank 95% Conf. Interval Qualitative Description  Verbal Comprehension 55 VCI 150 >99.9 142-154 Very Superior  Perceptual Reasoning 42 PRI 123 94 116-128 Superior  Working Memory 27 WMI 119 90 111-125 High Average  Processing  Speed 25 PSI 114 82 104-121 High Average  Full Scale 149 FSIQ 136 99 131-139 Very Superior  General Ability 97 GAI 144 99.8 138-148 Very Superior    In order to provide a thorough assessment of a wide  range of cognitive variables and a highly structured, standardized and well normed measure that would allow for repeat testing in the future the patient was administered the Wechsler Adult Intelligence Scale-IV.  As the patient has been having some changes noted potentially related to memory the patient is current score should be taken as a description of his current functioning capacity and not as a complete description of his premorbid/lifelong intellectual and cognitive abilities  2 composite scores were calculated looking at global cognitive abilities.  The patient produced a full-scale IQ score of 149 which falls at the 99th percentile and in the very superior range relative to a normative population.  This is consistent with premorbid estimates and does not suggest any significant change in global cognitive functioning.  We also calculated the patient's general abilities index score which places less emphasis on items more typically vulnerable to acute change including information processing speed and auditory encoding variables.  The patient produced a general abilities index score of 144 which falls at the 99.8 percentile and also in the very superior range.  These are excellent scores and consistent with estimations of premorbid intellectual and cognitive functioning.          Verbal Comprehension Subtests Summary        Subtest Raw Score Scaled Score Percentile Rank Reference Group Scaled Score SEM  Similarities 35 17 99 17 1.04  Vocabulary 57 19 99.9 19 0.67  Information 26 19 99.9 19 0.73    The patient produced a verbal comprehension index score of 150 which falls above the 99 point 9th percentile and in the very superior range.  Individual subtest were all excellent particular related to  verbal reasoning and problem-solving, vocabulary knowledge and general fund of information.  There was no indication of any changes in verbal based language skills or verbal based knowledge and capacity.          Perceptual Reasoning Subtests Summary        Subtest Raw Score Scaled Score Percentile Rank Reference Group Scaled Score SEM  Block Design 38 11 63 8 1.08  Matrix Reasoning 23 16 98 14 0.90  Visual Puzzles 18 15 95 11 0.85    The patient produced a perceptual reasoning index score of 123 which falls at the 94th percentile and in the superior range relative to a normative population.  Of the 3 subtest that went into this composite score the patient's lowest performing relative score was still in the average range and it was related to visual motor skills and ability to analyze geometric patterns and part-whole recognition skills.  This was at the 63rd percentile relative to a normative population.  The other 2 variables were at the 90/5-90 8th percentile including measures assessing nonverbal reasoning skills, broad visual intelligence and visual perceptual visual organizational skills.  Patient possesses excellent fluid reasoning skills.          Working Comptroller Raw Score Scaled Score Percentile Rank Reference Group Scaled Score SEM  Digit Span 31 13 84 11 0.79  Arithmetic 18 14 91 13 0.99            Processing Speed Subtests Summary        Subtest Raw Score Scaled Score Percentile Rank Reference Group Scaled Score SEM  Symbol Search 34 13 84 10 1.31  Coding 63 12 75 8 0.99    The patient produced a working memory index score of 119 which  falls at the 90th percentile in the high average range relative to a normative population.  While this composite score is generally below his global intellectual and cognitive abilities this particular group of measures do not always display a full bell curve distribution within a normative population and is performance  on both primary auditory encoding and his capacity to actively process information in his auditory Register were excellent without indication of any deficits.  The patient produced a processing speed index score of 114 which falls at the 82nd percentile and in the high average range relative to a normative population.  There was little scatter noted in subtest performance and the patient did well on measures of visual scanning, visual searching and overall speed of mental operations.  While this may represent some change from premorbid functioning and the patient has shown some very mild chronic microvascular ischemic changes this level of performance is not indicative of significant problems in these areas and likely is representing some subtle changes secondary to mild age-related microvascular ischemic changes in white matter regions.  WMS-IV           Index Score Summary        Index Sum of Scaled Scores Index Score Percentile Rank 95% Confidence Interval Qualitative Descriptor  Auditory Memory (AMI) 50 115 84 108-120 High Average  Visual Memory (VMI) 35 92 30 87-98 Average  Visual Working Memory (VWMI) 27 120 91 111-126 Superior  Immediate Memory (IMI) 42 103 58 97-109 Average  Delayed Memory (DMI) 43 106 66 99-112 Average    In order to assess memory in a systematic way to provide a more complete description of memory and learning the patient was administered the Wechsler Memory Scale-IV.  On the Wechsler Adult Intelligence Scale, the patient did well on measures of primary auditory encoding and his capacity to process information in his active auditory Register.  On similar type measures related to visual encoding the patient performed performed well producing a visual working memory index score of 120 which fell at the 91st percentile in the superior range relative to a normative population.  Both of these cognitive skills related to auditory and visual encoding capacity were quite good and  should not have any deleterious or negative impact on his capacity to store and learn new information.  Breaking memory functions down between auditory versus visual memory the patient produced an auditory memory index score of 115 which fell at the 84th percentile and in the high average range relative to a normative population.  The patient did have some indication of greater visual memory difficulties versus auditory memory difficulties.  The patient produced a visual memory index score of 92 which fell at the 30th percentile and in the average range.  I suspect this does represent some significant change from premorbid functioning.  Looking at immediate versus delayed memory the patient's scores were evened out and the significant difference between auditory versus visual memory itself had an impact on the combination of these 2 variables.  The patient produced an immediate memory index score of 103 which falls at the 58th percentile and in the average range.  On the delayed memory index the patient had an index score of 106 which fell at the 66 percentile and in the average range.  Looking at these 2 index score suggest that the information that the patient does initially encode and store is readily available after period of delay and there is no loss of memory as a function of  time.  The patient was then given a number of recognition/cued recall measures to help differentiate between the weakness is identified particularly for visual memory as to them being related to either storage and organization of information or difficulties with self directed retrieval of information.  The patient performed quite well on cued/recognition type task.  Overall, the patient displayed well-preserved auditory and visual encoding capacity, and in-depth analysis of individual subtest performance would suggest that the primary weakness that was seen and relative comparisons to premorbid and global intellectual estimates  were most likely related to retrieval of information rather than an inability to effectively store and organize new information.  When cueing was applied the patient's performance was consistent with premorbid estimates.         Primary Subtest Scaled Score Summary       Subtest Domain Raw Score Scaled Score Percentile Rank  Logical Memory I AM 34 13 84  Logical Memory II AM 27 13 84  Verbal Paired Associates I AM 39 13 84  Verbal Paired Associates II AM 10 11 63  Designs I VM 44 6 9  Designs II VM 46 9 37  Visual Reproduction I VM 33 10 50  Visual Reproduction II VM 21 10 50  Spatial Addition VWM 20 17 99  Symbol Span VWM 20 10 50          Auditory Memory Process Score Summary      Process Score Raw Score Scaled Score Percentile Rank Cumulative Percentage (Base Rate)  LM II Recognition 28 - - >75%  VPA II Recognition 40 - - >75%  VPA II Word Recall 18 12 75 -           Visual Memory Process Score Summary      Process Score Raw Score Scaled Score Percentile Rank Cumulative Percentage (Base Rate)  DE I Content 34 10 50 -  DE I Spatial 8 4 2  -  DE II Content 32 10 50 -  DE II Spatial 10 9 37 -  DE II Recognition 12 - - 17-25%  VR II Recognition 6 - - >75%  VR II Copy 43 - - >75%    Summary of Results:   Overall, the results of the current objective neuropsychological test battery do suggest that the patient continues to do very well from a global cognitive perspective.  There were no specific deficits or weaknesses noted related to verbal based language skills including lexical and semantic fluency, vocabulary retention, maintaining his general fund of information and verbal reasoning and problem-solving capacity.  The patient is doing well on measures of visual spatial, visual organizational capacity and visual reasoning and problem-solving skills.  The patient is also doing well with regard to both auditory and visual encoding capacity and attention and concentration appears to be  well-maintained.  The patient also performed well relative to a normative population but somewhat below predicted levels of premorbid estimations related visual scanning, visual search and overall speed of mental operations.  His current capacity is still roughly 1 standard deviation above normative comparison groups.  On memory and learning the patient displayed very good retention of his auditory and visual encoding capacity but did show greater weaknesses with regard to visual memory versus auditory memory and learning.  In-depth analysis of the full learning and memory process strongly suggest that the primary weakness and deficit had to do with free recall of recently learned information rather than an inability to store and organize that information.  The patient's performance was quite good under recognition/cued recall formats.  Consistent with the patient and his family's description about memory difficulties there was a clear weakness relative to both premorbid estimates and predictions that would be made for his capacity when taking into account his global intellectual and cognitive abilities currently.  Again, the likely culprit has to do with retrieval of information rather than an inability to store and organize this information.  Impression/Diagnosis:   The results of the current neuropsychological evaluation are quite encouraging.  There does not appear to be any specific or significant loss in global intellectual and cognitive abilities and the patient is maintaining his visual spatial and visual constructional capacity quite well, his language based skills, excellent retention of executive functioning including problem-solving and verbal and visual reasoning and problem-solving capacity.  The patient also showed excellent attention and concentration and well-preserved visual and auditory encoding capacity.  The patient did show weaknesses both relative to his on premorbid estimates as well as  relative to a normative population for measures of visual memory and learning particularly for visual based learning skills.  Auditory memory and learning was also below premorbid estimates but there was a significant difference between visual versus auditory learning and memory.  In-depth analysis would suggest that the primary culprit has to do with retrieval of recently learned information rather than an inability to store and organize that information.  As the patient's primary cerebrovascular issues have to do with small embolic strokes that have happened previously in the cerebellar region the fact that he is doing very well as far as maintaining his intellectual and cognitive abilities is understandable.  Patient notes some mild changes in gait and muscle coordination which would be consistent with cerebellar involvement.  The cognitive and memory changes noted by the patient and wife do not appear to be related to any type of progressive neurodegenerative process and are likely reflecting some mild chronic age-related small vessel disease/micro vascular ischemic changes consistent with age.  Impacts on white matter tracts from these types of process consistently produce findings such as were seen in this case.  The patient's memory difficulties that are part of his subjective reports and reports of his wife would be consistent with changes in long white matter fasciculus connections which are the most vulnerable areas related to small vessel changes.  Given the patient's history of A-fib, pulmonary emboli, respiratory and pulmonary issues hypertension, and some early signs of prediabetes these findings would be expected and not indicative of a particular disease process per se.  We now have a in-depth baseline measure of a wide range of cognitive and memory capacity if there are any progressive changes with the patient.  I do not think there is any indication of a progressive degenerative neurological  condition such as Alzheimer's, Lewy body etc. and the subjective changes the patient and family are noting and the obtain neuropsychological/cognitive performance measures would be consistent with mild age-related changes in an individual that has always function in the very superior range relative to his peers thus making any change in cognitive efficiency more notable by both the patient as well as his family.  As far as recommendations, it will be critically important for the patient to continue to be compliant with his CPAP device, adhering to recommendations of cardiology and neurology regarding his stroke risk and cardiovascular and pulmonary health, and maintaining a good healthy diet and regular daily exercise.  These foundational behavioral patterns will be important to reduce  the risk of future cerebrovascular events and help maintain vascular health going forward.  I will sit down with the patient and go over the results of the current neuropsychological evaluation.  There are specific recommendations and adjustments that can be made regarding memory difficulties related to memory changes related to retrieval difficulties and the fact that he is actually storing and organizing new information will allow for adaptation and cognitive adjustments to be quite effective.  He is already utilizing lists and other reminders to supplement and cue recall and I suspect these types of things will be quite helpful.  Diagnosis:    Memory loss  Cerebrovascular accident (CVA) due to embolism of carotid artery, unspecified blood vessel laterality (HCC)  Chronic respiratory failure with hypoxia (HCC)  OSA (obstructive sleep apnea)   _____________________ Arley Phenix, Psy.D. Clinical Neuropsychologist

## 2023-03-18 ENCOUNTER — Encounter: Payer: Medicare Other | Admitting: Psychology

## 2023-03-18 DIAGNOSIS — R413 Other amnesia: Secondary | ICD-10-CM | POA: Diagnosis not present

## 2023-03-18 DIAGNOSIS — I63139 Cerebral infarction due to embolism of unspecified carotid artery: Secondary | ICD-10-CM | POA: Diagnosis not present

## 2023-03-18 DIAGNOSIS — J9611 Chronic respiratory failure with hypoxia: Secondary | ICD-10-CM | POA: Diagnosis not present

## 2023-03-18 DIAGNOSIS — G4733 Obstructive sleep apnea (adult) (pediatric): Secondary | ICD-10-CM | POA: Diagnosis not present

## 2023-03-21 ENCOUNTER — Telehealth: Payer: Self-pay | Admitting: Neurology

## 2023-03-21 NOTE — Telephone Encounter (Signed)
 LVM and sent mychart msg informing pt of need to reschedule 04/25/23 appt - MD out

## 2023-03-23 ENCOUNTER — Ambulatory Visit: Payer: Medicare Other | Attending: Cardiology | Admitting: Cardiology

## 2023-03-23 ENCOUNTER — Encounter: Payer: Self-pay | Admitting: Cardiology

## 2023-03-23 VITALS — BP 140/70 | HR 78 | Ht 72.5 in | Wt 232.1 lb

## 2023-03-23 DIAGNOSIS — I10 Essential (primary) hypertension: Secondary | ICD-10-CM | POA: Diagnosis present

## 2023-03-23 DIAGNOSIS — I4819 Other persistent atrial fibrillation: Secondary | ICD-10-CM | POA: Diagnosis present

## 2023-03-23 DIAGNOSIS — E7849 Other hyperlipidemia: Secondary | ICD-10-CM | POA: Diagnosis present

## 2023-03-23 MED ORDER — ATORVASTATIN CALCIUM 20 MG PO TABS: 20.0000 mg | ORAL_TABLET | Freq: Every day | ORAL | 3 refills | Status: AC

## 2023-03-23 NOTE — Patient Instructions (Addendum)
Medication Instructions:  Your physician has recommended you make the following change in your medication:  1) INCREASE atorvastatin to 20 mg daily *If you need a refill on your cardiac medications before your next appointment, please call your pharmacy*  Follow-Up: At [facility], you and your health needs are our priority.  As part of our continuing mission to provide you with exceptional heart care, we have created designated Provider Care Teams.  These Care Teams include your primary Cardiologist (physician) and Advanced Practice Providers (APPs -  Physician Assistants and Nurse Practitioners) who all work together to provide you with the care you need, when you need it.  Your next appointment:   1 year(s)  Provider:   Dr. Steffanie Dunn Other Instructions You have been referred to see Dr. Rennis Golden

## 2023-03-23 NOTE — Progress Notes (Signed)
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   Last seen by Otilio Saber in February 2024.  The patient was previously followed by Dr. Johney Frame.  The patient has a history of paroxysmal atrial fibrillation and flutter, hypertension.  He has a history of 2 prior ablations (November 2012 in March 2014).  He is maintained on Eliquis for stroke prophylaxis.  He also has a history of a pulmonary embolism followed at [facility]. He sent a message in April of this year reporting palpitations and increased heart rate.  His Apple Watch showed atrial fibrillation.  He was referred to the A-fib clinic.  At the appointment in the atrial fibrillation clinic he was in atrial fibrillation with rapid ventricular rates.  He was referred to the emergency department for cardioversion.  He was successfully cardioverted in the emergency department and he was discharged home.  During the second catheter ablation for his atrial fibrillation the left inferior and right superior pulmonary veins required reisolation.  Dr. Johney Frame reisolated these 2 veins and then ablated at the SVC-RA junction.  The CTI demonstrated baseline block from a prior ablation.  He is doing well today.  He has not had another episode of atrial fibrillation since April.  He continues to take Eliquis without bleeding issues.     Past medical, surgical, social and family history were reviewed.  ROS:   Please see the history of present illness.    All other systems reviewed and are negative.  EKGs/Labs/Other Studies Reviewed:    The following studies were reviewed today:  October 05, 2022 echo EF 60 to 65% RV normal Moderately dilated left  atrium No MR  August 25, 2019 CTA coronary  CAC 65th percentile Dilated ascending aorta  November 16, 2022 EKG shows sinus rhythm.  PVC.  Inferior Q waves.  Lack of anterior forces.  November 10, 2022 EKG Atrial fibrillation with rapid ventricular rates       Physical Exam:    VS:  BP (!) 140/70 (BP Location: Left Arm, Patient Position: Sitting, Cuff Size: Normal)   Pulse 78   Ht 6' 0.5" (1.842 m)   Wt 232 lb 2 oz (105.3 kg)   SpO2 96%   BMI 31.05 kg/m     Wt Readings from Last 3 Encounters:  03/23/23 232 lb 2 oz (105.3 kg)  11/16/22 235 lb 12.8 oz (107 kg)  11/10/22 234 lb 12.6 oz (106.5 kg)     GEN:  Well nourished, well developed in no acute distress CARDIAC: RRR, no murmurs, rubs, gallops RESPIRATORY:  Clear to auscultation without rales, wheezing or rhonchi       ASSESSMENT:    1. Persistent atrial fibrillation (HCC)   2. Primary hypertension    PLAN:    In order of problems listed above:  #Persistent atrial fibrillation and flutter On Eliquis for stroke prophylaxis Continue diltiazem Doing well after his April cardioversion.  He has not had a recurrence.  For now, continue diltiazem.  I did discuss redo catheter ablation with the patient during today's visit.  We discussed pulsed field ablation.  This could be an option for him moving forward  if he were to have recurrence.  #Coronary artery disease I reviewed his CT scan with the patient today.  Given his elevated coronary artery calcium score, will refer to Dr. Rennis Golden for consideration of other risk reduction strategies.  I am going to increase his Lipitor to 20 mg by mouth once daily.  #Hypertension At goal today.  Recommend checking blood pressures 1-2 times per week at home and recording the values.  Recommend bringing these recordings to the primary care physician. Continue losartan and hydrochlorothiazide.      Signed, Steffanie Dunn, MD, [facility], [facility] 03/23/2023 11:21 AM     Electrophysiology [facility]

## 2023-03-24 ENCOUNTER — Ambulatory Visit: Payer: Medicare Other | Admitting: Psychology

## 2023-04-07 NOTE — Telephone Encounter (Signed)
Rescheduled pt appt on 05/18/23 at 3:00pm

## 2023-04-20 ENCOUNTER — Encounter: Payer: Self-pay | Admitting: Psychology

## 2023-04-20 NOTE — Progress Notes (Signed)
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  The patient was referred for neuropsychological evaluation due to ongoing issues with memory and cognitive functioning.  I have included the reason for service for the evaluation below for convenience as well as a summary of my neuropsychological evaluation.  Today I provided feedback and went over specific recommendations going forward.  The patient's complete neuropsychological evaluation can be found in his EMR dated 03/17/2023.  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  The patient had previously been followed by [name] before transferring to [name] and was initially seen for headaches and mild memory concerns by [name].  He was followed by [name] since 2020.  Patient is also hard of hearing.  Patient continues to be social and interested in hobbies and continues to play tennis 1 night per week and travel with family and friends.  Serial MoCA testing has been stable.  No changes in geographic orientation noted.  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  Patient reports that his wife tells him that he is "not listening" to her and that he is either not hearing her well or forgetting what she says.  Purpose of the evaluation is to produce baseline objective assessment of cognitive variables and memory functions.  Patient reports that memory issues are primary difficulties and that he is also having some physical changes but is remaining quite active.  Patient reports that he feels like his wife often catches him at inopportune times telling him things to do but admits that some of his lack of recall for what she is telling him may be due to him not paying attention as well.  Patient reports that memory difficulties are more likely to be semantic in nature rather than episodic memory difficulties.  Patient denies any word finding difficulties or targeted naming issues.  Patient reports that he is making more lists to avoid forgetting things and working on various strategies to try to improve his retention of information that his wife tells him.  Patient denies any geographic disorientation, tremors, visual hallucinations or delusions.  Patient also describes some mild changes in balance.   Patient's past medical history includes history of A-fib with prior pulmonary embolism and previous ablation interventions for his A-fib on 2 occasions.  Patient describes some shortness of breath and dyspnea on exertion since the pulmonary embolism.  Patient has remained on anticoagulation medications.  Patient began reporting difficulties with memory in 2020 but reports that these memory changes  have been rather stable over time.  Patient has bilateral hearing aids.  Patient described an incidence of vertigo in 2022 upon wakening after having some nausea and vomiting the night prior.  Patient has had improvement in vertigo over time.  There is some mild gait changes noted by neurology but no falls.  No  vision disturbance noted and there was no indications of focal numbness or weakness in arms, face or legs.  Patient has developed some mild slight numbness in his toes and he is borderline diabetic.   Impression/Diagnosis:   The results of the current neuropsychological evaluation are quite encouraging.  There does not appear to be any specific or significant loss in global intellectual and cognitive abilities and the patient is maintaining his visual spatial and visual constructional capacity quite well, his language based skills, excellent retention of executive functioning including problem-solving and verbal and visual reasoning and problem-solving capacity.  The patient also showed excellent attention and concentration and well-preserved visual and auditory encoding capacity.  The patient did show weaknesses both relative to his on premorbid estimates as well as relative to a normative population for measures of visual memory and learning particularly for visual based learning skills.  Auditory memory and learning was also below premorbid estimates but there was a significant difference between visual versus auditory learning and memory.  In-depth analysis would suggest that the primary culprit has to do with retrieval of recently learned information rather than an inability to store and organize that information.  As the patient's primary cerebrovascular issues have to do with small embolic strokes that have happened previously in the cerebellar region the fact that he is doing very well as far as maintaining his intellectual and cognitive abilities is understandable.  Patient notes some mild changes in gait and muscle coordination which would be consistent with cerebellar involvement.  The cognitive and memory changes noted by the patient and wife do not appear to be related to any type of progressive neurodegenerative process and are likely reflecting some mild chronic age-related small vessel  disease/micro vascular ischemic changes consistent with age.  Impacts on white matter tracts from these types of process consistently produce findings such as were seen in this case.  The patient's memory difficulties that are part of his subjective reports and reports of his wife would be consistent with changes in long white matter fasciculus connections which are the most vulnerable areas related to small vessel changes.  Given the patient's history of A-fib, pulmonary emboli, respiratory and pulmonary issues hypertension, and some early signs of prediabetes these findings would be expected and not indicative of a particular disease process per se.  We now have a in-depth baseline measure of a wide range of cognitive and memory capacity if there are any progressive changes with the patient.  I do not think there is any indication of a progressive degenerative neurological condition such as Alzheimer's, Lewy body etc. and the subjective changes the patient and family are noting and the obtain neuropsychological/cognitive performance measures would be consistent with mild age-related changes in an individual that has always function in the very superior range relative to his peers thus making any change in cognitive efficiency more notable by both the patient as well as his family.  As far as recommendations, it will be critically important for the patient to continue to be compliant with his CPAP device, adhering to recommendations of cardiology and neurology regarding his stroke risk and cardiovascular and pulmonary health, and maintaining a  good healthy diet and regular daily exercise.  These foundational behavioral patterns will be important to reduce the risk of future cerebrovascular events and help maintain vascular health going forward.  I will sit down with the patient and go over the results of the current neuropsychological evaluation.  There are specific recommendations and adjustments that can be  made regarding memory difficulties related to memory changes related to retrieval difficulties and the fact that he is actually storing and organizing new information will allow for adaptation and cognitive adjustments to be quite effective.  He is already utilizing lists and other reminders to supplement and cue recall and I suspect these types of things will be quite helpful.  Diagnosis:    Memory loss  Cerebrovascular accident (CVA) due to embolism of carotid artery, unspecified blood vessel laterality (HCC)  Chronic respiratory failure with hypoxia (HCC)  OSA (obstructive sleep apnea)   _____________________ Arley Phenix, Psy.D. Clinical Neuropsychologist

## 2023-04-24 ENCOUNTER — Other Ambulatory Visit: Payer: Self-pay | Admitting: Primary Care

## 2023-04-25 ENCOUNTER — Ambulatory Visit: Payer: Medicare Other | Admitting: Neurology

## 2023-05-18 ENCOUNTER — Encounter: Payer: Self-pay | Admitting: Neurology

## 2023-05-18 ENCOUNTER — Ambulatory Visit (INDEPENDENT_AMBULATORY_CARE_PROVIDER_SITE_OTHER): Payer: Medicare Other | Admitting: Neurology

## 2023-05-18 VITALS — BP 146/97 | HR 72 | Ht 72.5 in | Wt 235.0 lb

## 2023-05-18 DIAGNOSIS — G6289 Other specified polyneuropathies: Secondary | ICD-10-CM

## 2023-05-18 DIAGNOSIS — E519 Thiamine deficiency, unspecified: Secondary | ICD-10-CM

## 2023-05-18 DIAGNOSIS — R413 Other amnesia: Secondary | ICD-10-CM

## 2023-05-18 DIAGNOSIS — K900 Celiac disease: Secondary | ICD-10-CM

## 2023-05-18 DIAGNOSIS — E538 Deficiency of other specified B group vitamins: Secondary | ICD-10-CM

## 2023-05-18 DIAGNOSIS — E531 Pyridoxine deficiency: Secondary | ICD-10-CM

## 2023-05-18 NOTE — Patient Instructions (Signed)
Orders Placed This Encounter  Procedures   ATN PROFILE   APOE Alzheimer's Risk   Sjogren's syndrome antibods(ssa + ssb)   Rheumatoid factor   Heavy metals, blood   Vitamin B6   Multiple Myeloma Panel (SPEP&IFE w/QIG)   B12 and Folate Panel   Tissue transglutaminase, IgA   Gliadin antibodies, serum   Vitamin B1   Methylmalonic acid, serum   TSH Rfx on Abnormal to Free T4   ANCA Profile   ANA, IFA (with reflex)

## 2023-05-18 NOTE — Progress Notes (Addendum)
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 Reviewed formal neurocognitive testing overall positive. He wants to be furter tested for memory decline. Also neuropathy.   Patient complains of symptoms per HPI as well as the following symptoms: neuropathy . Pertinent negatives and positives per HPI. All others negative   04/20/2022 Dr. 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 He is here for follow up of headache and mild memory concern he was a patient of Dr. Anne [name]'. His wife feels like he isn;t as sharp as he used to be. He does not feel like his memory is changing but he has been seeing Dr. Anne [name] for memory since 2020. He missed a bill but it got buried under mail and that has only happened 1-2 times a year. He has difficulty hearing. Still social, still interested in hobbies, they travel and just went down the [place]. He plays tennis one night a week. They travel, they have dinner with friends every Thursday. [facility] has been stable. Not getting lost or having accidents. Mom was "frail" at 23 physically and mentally not as sharp, father died at 52 he was sick but none diagnosed with dementia, 3 little sisters and they are all fine as far as memory/cognition. He usually knows what day it is but harder in retirement.he loses words and sometimes when he practices still once a week it is a little harder.  No other focal neurologic deficits, associated symptoms, inciting events or modifiable factors.   Decreased to all modalities distally, can feel vibration in the medial malleoukus Distal rubor but warm  He has some peripheral neuropathy. Diabetes/pre-diabetes. Has a fhx of autoimmune disorders.   Patient complains of symptoms per HPI as well as the following symptoms: memory loss . Pertinent negatives and positives per HPI. All others  negative  Reviewed mri images and agree: EXAM: MRI HEAD WITHOUT CONTRAST   TECHNIQUE: Multiplanar, multiecho pulse sequences of the brain and surrounding structures were obtained without intravenous contrast.   COMPARISON:  December 2020   FINDINGS: 03/2021 Brain: There is no acute infarction or intracranial hemorrhage. There is no intracranial mass, mass effect, or edema. There is no hydrocephalus or extra-axial fluid collection. Prominence of the ventricles and sulci reflects generalized parenchymal volume loss. Patchy T2 hyperintensity in the supratentorial white matter is nonspecific but may reflect mild chronic microvascular ischemic changes. Very small bilateral chronic cerebellar infarcts.   Vascular: Major vessel flow voids at the skull base are preserved.   Skull and upper cervical spine: Normal marrow signal is preserved.   Sinuses/Orbits: Paranasal sinuses are aerated. Orbits are unremarkable.   Other: Sella is unremarkable.  Mastoid air cells are clear.   IMPRESSION: No evidence of recent infarction, hemorrhage, or mass. Mild chronic microvascular ischemic changes. Tiny bilateral chronic cerebellar infarcts.   Patient complains of symptoms per HPI as well as the following symptoms: subjective memory concerns . Pertinent negatives and positives per HPI. All others negative   History of present illness: Last 04/14/2021 Dr. 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  He has reported some shortness of breath and dyspnea on exertion since the pulmonary embolism.  He remains on anticoagulation.  The patient has been seen through this office in 2020 with reports a mild memory  changes, this has not changed much over time.  The patient does have difficulty hearing, he has bilateral hearing aids.  He had onset of vertigo upon awakening around 03 April 2021.  He had some nausea and  vomiting the night prior, but had true vertigo the next morning.  He had a mild bitemporal headache associated with this.  The vertigo has gradually improved over time.  The patient has noted some mild gait instability, but no falls.  He reports no double vision or loss of vision or any focal numbness or weakness of the face, arms, legs.  He does have borderline diabetes and has developed some slight numbness in the toes.  He has no weakness of the extremities.  He has not had any change in speech or swallowing.  He comes to this office for further evaluation.  A recent MRI of the brain done yesterday does not show any acute changes, mild small vessel changes are noted.  This was noted on the prior study in 2020.  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HCC)    ongoing evaluation for elevated R>L adosterone secretion   Arthritis    Atrial enlargement, bilateral    Atrial fibrillation (HCC)    paroxysmal   Atrial flutter (HCC)    Complication of anesthesia    could not urinate after UHR, needed cath, mental fog sinc cervical fusion 08-09-16    Dysrhythmia    a-fib-had ablation x2   GERD (gastroesophageal reflux disease)    Hyperaldosteronism (HCC)    Hypertension    Kidney cysts    "multiple"   Nephrolithiasis    Pneumonia 1990's   Pre-diabetes    Prostatitis    Renal insufficiency    "Cr 1.6"    Past Surgical History:  Procedure Laterality Date   ABLATION     ATRIAL FIBRILLATION ABLATION N/A 09/28/2012   Procedure: ATRIAL FIBRILLATION ABLATION;  Surgeon: Hillis Range, MD;  Location: [facility] CATH LAB;  Service: Cardiovascular;  Laterality: N/A;   atrial fibrillation ablation with CTI ablation  05/27/11, 09/28/12   ablation for afib and atrial flutter by Dr Allyne Gee SURGERY  08/09/2016   C 5 6  C 6 to C 7 cervical fusion   CARDIOVERSION  07/28/2011   Procedure: CARDIOVERSION;  Surgeon: Lewayne Bunting, MD;  Location: [facility] OR;  Service: Cardiovascular;   Laterality: N/A;   COLONOSCOPY WITH PROPOFOL N/A 03/03/2015   Procedure: COLONOSCOPY WITH PROPOFOL;  Surgeon: Charolett Bumpers, MD;  Location: [facility];  Service: Gastroenterology;  Laterality: N/A;   double j stent placement  06/2004   left   ESOPHAGOGASTRODUODENOSCOPY (EGD) WITH PROPOFOL N/A 03/03/2015   Procedure: ESOPHAGOGASTRODUODENOSCOPY (EGD) WITH PROPOFOL;  Surgeon: Charolett Bumpers, MD;  Location: [facility] ENDOSCOPY;  Service: Gastroenterology;  Laterality: N/A;   ESOPHAGOGASTRODUODENOSCOPY (EGD) WITH PROPOFOL N/A 10/25/2016   Procedure: ESOPHAGOGASTRODUODENOSCOPY (EGD) WITH PROPOFOL;  Surgeon: Charolett Bumpers, MD;  Location: [facility] ENDOSCOPY;  Service: Endoscopy;  Laterality: N/A;   INGUINAL HERNIA REPAIR     "as a child; ? right"   INGUINAL HERNIA REPAIR Left 06/23/2016   Procedure: OPEN LEFT INGUINAL HERNIA  REPAIR WITH MESH;  Surgeon: Luretha Murphy, MD;  Location: [facility];  Service: General;  Laterality: Left;   INSERTION OF MESH Left 06/23/2016   Procedure: INSERTION OF MESH;  Surgeon: Luretha Murphy, MD;  Location: [facility];  Service: General;  Laterality: Left;   LEFT HEART CATH  AND CORONARY ANGIOGRAPHY N/A 07/25/2018   Procedure: LEFT HEART CATH AND CORONARY ANGIOGRAPHY;  Surgeon: Lyn Records, MD;  Location: [facility] INVASIVE CV LAB;  Service: Cardiovascular;  Laterality: N/A;   LUNG BIOPSY  1993   NASAL SEPTOPLASTY W/ TURBINOPLASTY  07/2006   prostate biopsy  2011   schwannoma removal  ~01/2010   left thorax   TEE WITHOUT CARDIOVERSION N/A 09/27/2012   Procedure: TRANSESOPHAGEAL ECHOCARDIOGRAM (TEE);  Surgeon: Laurey Morale, MD;  Location: [facility] ENDOSCOPY;  Service: Cardiovascular;  Laterality: N/A;   ULTRASOUND GUIDANCE FOR VASCULAR ACCESS  07/25/2018   Procedure: Ultrasound Guidance For Vascular Access;  Surgeon: Lyn Records, MD;  Location: [facility] INVASIVE CV LAB;  Service: Cardiovascular;;   UMBILICAL HERNIA REPAIR  07/2006    Family History  Problem  Relation Age of Onset   Atrial fibrillation Sister    Atrial fibrillation Other        sister has had 2 afib ablations   Alzheimer's disease Neg Hx    Dementia Neg Hx     Social history:  reports that he has never smoked. He has never used smokeless tobacco. He reports current alcohol use of about 4.0 - 8.0 standard drinks of alcohol per week. He reports that he does not use drugs.    Allergies  Allergen Reactions   Aldactone [Spironolactone]     Breast tissue enlargement   Codeine Other (See Comments)    Headache    Keflex [Cephalexin] Diarrhea   Lisinopril Cough   Nexium [Esomeprazole Magnesium] Diarrhea   Sulfa Antibiotics Rash   Sulfa Drugs Cross Reactors Rash    Medications:  Prior to Admission medications   Medication Sig Start Date End Date Taking? Authorizing Provider  allopurinol (ZYLOPRIM) 100 MG tablet Take 100 mg by mouth 2 (two) times daily.   Yes [provider]  apixaban (ELIQUIS) 5 MG TABS tablet Take 1 tablet (5 mg total) by mouth 2 (two) times daily. 04/11/19  Yes [name], Rachel Bo, DO  Ascorbic Acid (VITAMIN C) 1000 MG tablet Take 1,000 mg by mouth daily.   Yes [provider]  atorvastatin (LIPITOR) 10 MG tablet Take 10 mg by mouth every evening.    Yes [provider]  CIALIS 20 MG tablet Take 20 mg by mouth daily as needed for erectile dysfunction.  09/23/16  Yes [provider]  COVID-19 mRNA vaccine, Moderna, 100 MCG/0.5ML injection Inject into the muscle. 11/17/20  Yes Judyann Munson, MD  diltiazem (CARDIZEM CD) 360 MG 24 hr capsule Take 360 mg by mouth every evening. 10/04/16  Yes [provider]  eplerenone (INSPRA) 50 MG tablet Take 50 mg by mouth 2 (two) times daily.   Yes [provider]  Fluticasone-Umeclidin-Vilant (TRELEGY ELLIPTA) 100-62.5-25 MCG/INH AEPB Inhale 1 puff into the lungs daily. 12/31/20  Yes [name], Bradley L, DO  Glucosamine HCl (GLUCOSAMINE PO) Take by mouth.   Yes [provider]  hydrochlorothiazide (MICROZIDE) 12.5 MG capsule Take 12.5 mg by mouth daily.   Yes [provider]  losartan (COZAAR) 100 MG tablet Take 100 mg by mouth daily.   Yes [provider]  MAGNESIUM PO Take 1 capsule by mouth daily.   Yes [provider]  metFORMIN (GLUCOPHAGE-XR) 500 MG 24 hr tablet SMARTSIG:2 Tablet(s) By Mouth Every Evening 04/02/21  Yes [provider]  montelukast (SINGULAIR) 10 MG tablet TAKE 1 TABLET(10 MG) BY MOUTH AT BEDTIME 03/18/21  Yes Glenford Bayley, NP  Multiple Vitamins-Minerals (ZINC  PO) Take 1 tablet by mouth 2 (two) times daily.   Yes [provider]  omeprazole (PRILOSEC) 40 MG capsule Take 40 mg by mouth daily before supper.    Yes [provider]  Potassium Gluconate 2.5 MEQ TABS Take 3 tablets by mouth 2 (two) times daily.   Yes [provider]  PROAIR HFA 108 [phone] Base) MCG/ACT inhaler SMARTSIG:2 Inhalation Via Inhaler Every 6 Hours PRN 12/26/19  Yes [provider]  sertraline (ZOLOFT) 100 MG tablet Take 100 mg by mouth daily. 06/18/20  Yes [provider]  vitamin B-12 (CYANOCOBALAMIN) 1000 MCG tablet Take 1,000 mcg by mouth daily.   Yes [provider]    Blood pressure (!) 146/97, pulse 72, height 6' 0.5" (1.842 m), weight 235 lb (106.6 kg).   Exam: NAD, pleasant                  Speech:    Speech is normal; fluent and spontaneous with normal comprehension.  Cognition:    The patient is oriented to person, place, and time;     recent and remote memory intact;     language fluent;    Cranial Nerves:    The pupils are equal, round, and reactive to light.Trigeminal sensation is intact and the muscles of mastication are normal. The face is symmetric. The palate elevates in the midline. Hearing intact. Voice is normal. Shoulder shrug is normal. The tongue has normal motion without fasciculations.   Coordination:  No dysmetria  Motor Observation:    No asymmetry,  no atrophy, and no involuntary movements noted. Tone:    Normal muscle tone.     Strength:    Strength is V/V in the upper and lower limbs.      Sensation: absent vibration to the medial malleoli, decreased pp and temp distally in a gradient fashion to the kneww, intact proprioception at great toe      04/20/2022    8:43 AM 04/14/2021   10:48 AM 06/04/2019    4:59 PM  Montreal Cognitive Assessment   Visuospatial/ Executive (0/5) 4 4 5   Naming (0/3) 3 3 3   Attention: Read list of digits (0/2) 2 2 2   Attention: Read list of letters (0/1) 1 1 1   Attention: Serial 7 subtraction starting at 100 (0/3) 3 3 3   Language: Repeat phrase (0/2) 2 2 2   Language : Fluency (0/1) 1 1 1   Abstraction (0/2) 2 2 2   Delayed Recall (0/5) 5 4 2   Orientation (0/6) 6 6 6   Total 29 28 27   Adjusted Score (based on education)   27     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, imbalance and distal peripheral neuropathy  - Neuropsychiatric testing for reported memory changes since 2020, overall positive - Peripheral neuropathy likely due to diabetes, but will also others - Imbalance: Fall precautions, likely multifactorial including peripheral neuropathy, recommended PT or balance exercises - still feels like he has memory loss, will order CT Amyloid PET to evaluate for approval for Lecanemab or Donanemab  Orders Placed This Encounter  Procedures   ATN PROFILE   APOE Alzheimer's Risk   Sjogren's syndrome antibods(ssa + ssb)   Rheumatoid factor   Heavy metals, blood   Vitamin B6   Multiple Myeloma Panel (SPEP&IFE w/QIG)   B12 and Folate Panel   Tissue transglutaminase, IgA   Gliadin antibodies, serum   Vitamin B1   Methylmalonic acid, serum   TSH Rfx on Abnormal to  Free T4   ANCA Profile   ANA, IFA (with reflex)     [name] MD  Orders Placed This Encounter  Procedures   ATN PROFILE   APOE Alzheimer's Risk   Sjogren's syndrome antibods(ssa +  ssb)   Rheumatoid factor   Heavy metals, blood   Vitamin B6   Multiple Myeloma Panel (SPEP&IFE w/QIG)   B12 and Folate Panel   Tissue transglutaminase, IgA   Gliadin antibodies, serum   Vitamin B1   Methylmalonic acid, serum   TSH Rfx on Abnormal to Free T4   ANCA Profile   ANA, IFA (with reflex)    [facility] [address] [place], [place] [address]  Phone [phone] Fax [phone]  I spent over 30 minutes of face-to-face and non-face-to-face time with patient on the  1. Other polyneuropathy   2. screen for Memory loss   3. screen for Vitamin B12 deficiency   4. screen Vitamin B1 deficiency   5. screen for Vitamin B6 deficiency   6. screen for gluten sensitivity     diagnosis.  This included previsit chart review, lab review, study review, order entry, electronic health record documentation, patient education on the different diagnostic and therapeutic options, counseling and coordination of care, risks and benefits of management, compliance, or risk factor reduction

## 2023-05-19 LAB — TSH RFX ON ABNORMAL TO FREE T4: TSH: 1.76 u[IU]/mL (ref 0.450–4.500)

## 2023-05-30 LAB — TISSUE TRANSGLUTAMINASE, IGA: Transglutaminase IgA: <2 U/mL (ref 0–3)

## 2023-05-30 LAB — APOE ALZHEIMER'S RISK

## 2023-05-30 LAB — HEAVY METALS, BLOOD
Arsenic: 6 ug/L (ref 0–9)
Lead, Blood: <1 ug/dL (ref 0.0–3.4)
Mercury: 2.8 ug/L (ref 0.0–14.9)

## 2023-05-30 LAB — GLIADIN ANTIBODIES, SERUM
Antigliadin Abs, IgA: 3 U (ref 0–19)
Gliadin IgG: 2 U (ref 0–19)

## 2023-05-30 LAB — ANCA PROFILE
Anti-MPO Antibodies: <0.2 U (ref 0.0–0.9)
Anti-PR3 Antibodies: <0.2 U (ref 0.0–0.9)
Atypical pANCA: <1:20 {titer}
C-ANCA: <1:20 {titer}
P-ANCA: <1:20 {titer}

## 2023-05-30 LAB — METHYLMALONIC ACID, SERUM: Methylmalonic Acid: 348 nmol/L (ref 0–378)

## 2023-05-30 LAB — MULTIPLE MYELOMA PANEL, SERUM
Albumin SerPl Elph-Mcnc: 3.8 g/dL (ref 2.9–4.4)
Albumin/Glob SerPl: 1.4 (ref 0.7–1.7)
Alpha 1: 0.2 g/dL (ref 0.0–0.4)
Alpha2 Glob SerPl Elph-Mcnc: 0.8 g/dL (ref 0.4–1.0)
B-Globulin SerPl Elph-Mcnc: 0.9 g/dL (ref 0.7–1.3)
Gamma Glob SerPl Elph-Mcnc: 1 g/dL (ref 0.4–1.8)
Globulin, Total: 2.9 g/dL (ref 2.2–3.9)
IgA/Immunoglobulin A, Serum: 251 mg/dL (ref 61–437)
IgG (Immunoglobin G), Serum: 1039 mg/dL (ref 603–1613)
IgM (Immunoglobulin M), Srm: 48 mg/dL (ref 20–172)
Total Protein: 6.7 g/dL (ref 6.0–8.5)

## 2023-05-30 LAB — ATN PROFILE
A -- Beta-amyloid 42/40 Ratio: 0.119 (ref >0.102)
Beta-amyloid 40: 306.88 pg/mL
Beta-amyloid 42: 36.45 pg/mL
N -- NfL, Plasma: 3.53 pg/mL (ref 0.00–4.61)
T -- p-tau181: 1.79 pg/mL — ABNORMAL HIGH (ref 0.00–0.97)

## 2023-05-30 LAB — SJOGREN'S SYNDROME ANTIBODS(SSA + SSB)
ENA SSA (RO) Ab: <0.2 AI (ref 0.0–0.9)
ENA SSB (LA) Ab: <0.2 AI (ref 0.0–0.9)

## 2023-05-30 LAB — VITAMIN B1: Thiamine: 117.6 nmol/L (ref 66.5–200.0)

## 2023-05-30 LAB — VITAMIN B6: Vitamin B6: 11.1 ug/L (ref 3.4–65.2)

## 2023-05-30 LAB — B12 AND FOLATE PANEL
Folate: 9.4 ng/mL (ref >3.0)
Vitamin B-12: 436 pg/mL (ref 232–1245)

## 2023-05-30 LAB — RHEUMATOID FACTOR: Rheumatoid fact SerPl-aCnc: <10 [IU]/mL (ref <14.0)

## 2023-05-30 LAB — ANTINUCLEAR ANTIBODIES, IFA: ANA Titer 1: NEGATIVE

## 2023-07-04 ENCOUNTER — Telehealth: Payer: Self-pay | Admitting: Neurology

## 2023-07-04 NOTE — Telephone Encounter (Signed)
Pt called stating that the last time he saw the provider they agreed on the pt not starting on medications yet. Pt is now wanting to re discuss the option of taking medication. Please advise.

## 2023-07-05 NOTE — Telephone Encounter (Signed)
I spoke with the patient. He is interested in having a CT pet amyloid scan and he verbalized appreciation.

## 2023-07-05 NOTE — Telephone Encounter (Signed)
We can order a CT PET Amyloid scan to look for amyloid plaques in the brain. These do show up early in alzheimer's. If he has them then we try to get donanemab approved. Ask if he would like a CT PET Scan to look for amyloid plaques which is alzheimers pathology.

## 2023-07-05 NOTE — Telephone Encounter (Signed)
I spoke with the patient. He states he was talking with his wife and she feels that his mental status is not as sharp as it has been. This has been little by little. He states he's not sure he agrees but he is listening to her. He didn't have a specific med preference but recalled briefly discuss donanumab. He is asking if Dr [name] even feels like he needs something. He can come for an appt to talk if needed. He thanked me for the call back.

## 2023-07-05 NOTE — Addendum Note (Signed)
Addended by: Bertram Savin on: 07/05/2023 02:19 PM   Modules accepted: Orders

## 2023-07-11 NOTE — Addendum Note (Signed)
Addended by: [name] B on: 07/11/2023 03:07 PM   Modules accepted: Orders

## 2023-07-29 ENCOUNTER — Encounter (HOSPITAL_COMMUNITY)
Admission: RE | Admit: 2023-07-29 | Discharge: 2023-07-29 | Disposition: A | Discharge status: Home / Self Care | Payer: Medicare Other | Source: Ambulatory Visit | Attending: Neurology | Admitting: Neurology

## 2023-07-29 DIAGNOSIS — R413 Other amnesia: Secondary | ICD-10-CM | POA: Diagnosis present

## 2023-07-29 MED ORDER — FLORBETAPIR F 18 500-1900 MBQ/ML IV SOLN
9.0890 | Freq: Once | INTRAVENOUS | Status: AC
  Administered 2023-07-29: 9.089 via INTRAVENOUS

## 2023-07-31 NOTE — Progress Notes (Signed)
 Pod 4 fyi: Negative for alzheimer's pathology. Wonderful news, happy new tear !

## 2023-08-01 ENCOUNTER — Encounter: Payer: Self-pay | Admitting: Neurology

## 2023-08-05 ENCOUNTER — Ambulatory Visit: Payer: Medicare Other | Attending: Internal Medicine | Admitting: Internal Medicine

## 2023-08-05 ENCOUNTER — Encounter: Payer: Self-pay | Admitting: Internal Medicine

## 2023-08-05 DIAGNOSIS — E782 Mixed hyperlipidemia: Secondary | ICD-10-CM

## 2023-08-05 DIAGNOSIS — I719 Aortic aneurysm of unspecified site, without rupture: Secondary | ICD-10-CM | POA: Diagnosis present

## 2023-08-05 DIAGNOSIS — E7849 Other hyperlipidemia: Secondary | ICD-10-CM | POA: Diagnosis present

## 2023-08-05 DIAGNOSIS — I2510 Atherosclerotic heart disease of native coronary artery without angina pectoris: Secondary | ICD-10-CM | POA: Insufficient documentation

## 2023-08-05 DIAGNOSIS — E119 Type 2 diabetes mellitus without complications: Secondary | ICD-10-CM | POA: Diagnosis not present

## 2023-08-05 NOTE — Patient Instructions (Addendum)
 Medication Instructions:  NO CHANGES  *If you need a refill on your cardiac medications before your next appointment, please call your pharmacy*   Lab Work: FASTING NMR lipoprofile and LPa and BMET as soon as able  ** complete before CT test  If you have labs (blood work) drawn today and your tests are completely normal, you will receive your results only by: MyChart Message (if you have MyChart) OR A paper copy in the mail If you have any lab test that is abnormal or we need to change your treatment, we will call you to review the results.   Testing/Procedures: CT angiogram chest/aorta at [facility]    Follow-Up: At [facility], you and your health needs are our priority.  As part of our continuing mission to provide you with exceptional heart care, we have created designated Provider Care Teams.  These Care Teams include your primary Cardiologist (physician) and Advanced Practice Providers (APPs -  Physician Assistants and Nurse Practitioners) who all work together to provide you with the care you need, when you need it.  We recommend signing up for the patient portal called MyChart.  Sign up information is provided on this After Visit Summary.  MyChart is used to connect with patients for Virtual Visits (Telemedicine).  Patients are able to view lab/test results, encounter notes, upcoming appointments, etc.  Non-urgent messages can be sent to your provider as well.   To learn more about what you can do with MyChart, go to [email or web address].    Your next appointment:   6 months with Dr. Mona ** CALL in March for a July appointment  Other Instructions   1st Floor: - Lobby - Registration  - Pharmacy  - Lab - Cafe  2nd Floor: - PV Lab - Diagnostic Testing (echo, CT, nuclear med)  3rd Floor: - Vacant  4th Floor: - TCTS (cardiothoracic surgery) - AFib Clinic - Structural Heart Clinic - Vascular Surgery  - Vascular Ultrasound  5th  Floor: - [facility] (general and EP) - Clinical Pharmacy for coumadin , hypertension, lipid, weight-loss medications, and med management appointments    Valet parking services will be available as well.

## 2023-08-05 NOTE — Progress Notes (Signed)
 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  This format is felt to be most appropriate for this patient at this time.  All issues noted in this document were discussed and addressed.  A limited physical exam was performed with this format.  Please refer to the patient's chart for his consent to telehealth for [facility].      Date:  08/05/2023   ID:  Marlyce ONEIDA Finer, MD, DOB 21-Apr-1956, MRN [id number] The patient was identified using 2 identifiers.  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  This is a pleasant 68 year old male kindly referred by Dr. Holts to establish general cardiology and also manage dyslipidemia.  He is a retired ENT in the area and has been established with our group in the past previously followed by Dr. Claudene before his retirement.  He underwent cardiac catheterization in 2019 which showed no angiographic coronary disease however he did have a CT in preparation for A-fib ablation back in 2021.  This demonstrated a CAC score of 174, 65th percentile.  The ascending aorta was dilated at 45 mm at the sinus of Valsalva and 42 mm at the mid a ascending aorta level.  He had a repeat echocardiogram in March of this year when he had recurrent atrial fibrillation.  That echo showed normal LV function at 60 to 65% with grade 1 diastolic dysfunction and moderate left atrial enlargement.  Aortic dilatation was again noted with  moderate enlargement of the ascending aorta at 43 mm.  He has not had reassessment since then.  He had not been on statin therapy and recently had seen Dr. Holts who recommended 20 mg atorvastatin .  Previously had been on 10 mg but noted that his cholesterol has always been very low.  Despite that he has a degree of age advanced coronary calcification.  He notes no early onset heart disease in the family.  He has undergone a bevy of test recently for concern of possible memory issues.  This included APO E testing which showed that he is in E2/E2 variant.  He is also followed by Dr. Faythe in endocrinology and was found to have hyperaldosteronism and is on eplerenone .  Renal function is monitored by Dr. Prescilla.  Surprisingly, we were not able to locate any recent lipid testing.  I have reviewed labs both through [facility], care everywhere, [facility] in our own records.  Leaves he did have lipid testing this past year but had not been able to locate that as well.  Prior CV studies:   The following studies were reviewed today:  Chart reviewed, lab work  PMHx:  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HCC)    ongoing evaluation for elevated R>L adosterone secretion   Arthritis    Atrial enlargement, bilateral    Atrial fibrillation (HCC)    paroxysmal   Atrial flutter (HCC)    Complication of anesthesia    could not urinate  after UHR, needed cath, mental fog sinc cervical fusion 08-09-16    Dysrhythmia    a-fib-had ablation x2   GERD (gastroesophageal reflux disease)    Hyperaldosteronism (HCC)    Hypertension    Kidney cysts    multiple   Nephrolithiasis    Pneumonia 1990's   Pre-diabetes    Prostatitis    Renal insufficiency    Cr 1.6    Past Surgical History:  Procedure Laterality Date   ABLATION     ATRIAL FIBRILLATION ABLATION N/A 09/28/2012   Procedure: ATRIAL FIBRILLATION ABLATION;  Surgeon: Lynwood Rakers, MD;  Location: [facility] CATH LAB;  Service:  Cardiovascular;  Laterality: N/A;   atrial fibrillation ablation with CTI ablation  05/27/11, 09/28/12   ablation for afib and atrial flutter by Dr Rakers [facility]  08/09/2016   C 5 6  C 6 to C 7 cervical fusion   CARDIOVERSION  07/28/2011   Procedure: CARDIOVERSION;  Surgeon: Redell GORMAN Shallow, MD;  Location: [facility] OR;  Service: Cardiovascular;  Laterality: N/A;   COLONOSCOPY WITH PROPOFOL  N/A 03/03/2015   Procedure: COLONOSCOPY WITH PROPOFOL ;  Surgeon: Gladis MARLA Louder, MD;  Location: [facility] ENDOSCOPY;  Service: Gastroenterology;  Laterality: N/A;   double j stent placement  06/2004   left   ESOPHAGOGASTRODUODENOSCOPY (EGD) WITH PROPOFOL  N/A 03/03/2015   Procedure: ESOPHAGOGASTRODUODENOSCOPY (EGD) WITH PROPOFOL ;  Surgeon: Gladis MARLA Louder, MD;  Location: [facility] ENDOSCOPY;  Service: Gastroenterology;  Laterality: N/A;   ESOPHAGOGASTRODUODENOSCOPY (EGD) WITH PROPOFOL  N/A 10/25/2016   Procedure: ESOPHAGOGASTRODUODENOSCOPY (EGD) WITH PROPOFOL ;  Surgeon: Gladis MARLA Louder, MD;  Location: [facility] ENDOSCOPY;  Service: Endoscopy;  Laterality: N/A;   INGUINAL HERNIA REPAIR     as a child; ? right   INGUINAL HERNIA REPAIR Left 06/23/2016   Procedure: OPEN LEFT INGUINAL HERNIA  REPAIR WITH MESH;  Surgeon: Donnice Gladis, MD;  Location: [facility];  Service: General;  Laterality: Left;   INSERTION OF MESH Left 06/23/2016   Procedure: INSERTION OF MESH;  Surgeon: Donnice Gladis, MD;  Location: [facility];  Service: General;  Laterality: Left;   LEFT HEART CATH AND CORONARY ANGIOGRAPHY N/A 07/25/2018   Procedure: LEFT HEART CATH AND CORONARY ANGIOGRAPHY;  Surgeon: Claudene Victory ORN, MD;  Location: [facility] INVASIVE CV LAB;  Service: Cardiovascular;  Laterality: N/A;   LUNG BIOPSY  1993   NASAL SEPTOPLASTY W/ TURBINOPLASTY  07/2006   prostate biopsy  2011   schwannoma removal  ~01/2010   left thorax   TEE WITHOUT CARDIOVERSION N/A 09/27/2012   Procedure: TRANSESOPHAGEAL ECHOCARDIOGRAM (TEE);  Surgeon:  Ezra GORMAN Shuck, MD;  Location: [facility] ENDOSCOPY;  Service: Cardiovascular;  Laterality: N/A;   ULTRASOUND GUIDANCE FOR VASCULAR ACCESS  07/25/2018   Procedure: Ultrasound Guidance For Vascular Access;  Surgeon: Claudene Victory ORN, MD;  Location: [facility] INVASIVE CV LAB;  Service: Cardiovascular;;   UMBILICAL HERNIA REPAIR  07/2006    FAMHx:  Family History  Problem Relation Age of Onset   Atrial fibrillation Sister    Atrial fibrillation Other        sister has had 2 afib ablations   Alzheimer's disease Neg Hx    Dementia Neg Hx     SOCHx:   reports that he has never smoked. He has never used smokeless tobacco. He reports current alcohol use of about 4.0 - 8.0 standard drinks of alcohol per week. He reports that he does not use drugs.  ALLERGIES:  Allergies  Allergen Reactions   Aldactone  [Spironolactone ]  Breast tissue enlargement   Codeine Other (See Comments)    Headache    Keflex [Cephalexin] Diarrhea   Lisinopril Cough   Nexium [Esomeprazole Magnesium ] Diarrhea   Sulfa Antibiotics Rash   Sulfa Drugs Cross Reactors Rash    MEDS:  Current Meds  Medication Sig   allopurinol  (ZYLOPRIM ) 100 MG tablet Take 100 mg by mouth 2 (two) times daily.   apixaban  (ELIQUIS ) 5 MG TABS tablet Take 1 tablet (5 mg total) by mouth 2 (two) times daily.   Ascorbic Acid  (VITAMIN C ) 1000 MG tablet Take 1,000 mg by mouth 3 (three) times a week.   Cholecalciferol (VITAMIN D -3 PO) Take by mouth.   CIALIS 20 MG tablet Take 20 mg by mouth daily as needed for erectile dysfunction.    diltiazem  (CARDIZEM  CD) 360 MG 24 hr capsule Take 360 mg by mouth every evening.   eplerenone  (INSPRA ) 50 MG tablet Take 1 tablet (50 mg total) by mouth 2 (two) times daily.   FIBER PO Taking 2 gummy by mouth daily   Glucosamine 500 MG CAPS 1 capsule with a meal Orally twice a day for 30 days   hydrochlorothiazide  (MICROZIDE ) 12.5 MG capsule Take 12.5 mg by mouth daily.   losartan  (COZAAR ) 100 MG tablet Take 100 mg by mouth  daily.   metFORMIN (GLUCOPHAGE) 500 MG tablet Take 500 mg by mouth every morning.   metoprolol  tartrate (LOPRESSOR ) 25 MG tablet Take 1 tablet by mouth every 8 hours as needed for afib HR over 100   montelukast  (SINGULAIR ) 10 MG tablet TAKE 1 TABLET(10 MG) BY MOUTH AT BEDTIME   Multiple Vitamin (MULTIVITAMIN) capsule Take 1 capsule by mouth 3 (three) times a week.   Multiple Vitamins-Minerals (ZINC PO) Take 1 tablet by mouth as needed.   MYRBETRIQ 50 MG TB24 tablet Take 50 mg by mouth daily.   omeprazole (PRILOSEC) 40 MG capsule Take 40 mg by mouth daily before supper.    potassium chloride  SA (KLOR-CON  M) 20 MEQ tablet Take 20 mEq by mouth 2 (two) times daily.   PROAIR  HFA 108 (90 Base) MCG/ACT inhaler SMARTSIG:2 Inhalation Via Inhaler Every 6 Hours PRN   scopolamine  (TRANSDERM-SCOP) 1 MG/3DAYS Place 1 patch onto the skin as needed.   sertraline  (ZOLOFT ) 100 MG tablet Take 100 mg by mouth daily.   tamsulosin (FLOMAX) 0.4 MG CAPS capsule Take 0.4 mg by mouth every morning.   TRULICITY 0.75 MG/0.5ML SOAJ Inject 0.75 mg into the skin once a week.   vitamin B-12 (CYANOCOBALAMIN ) 1000 MCG tablet Take 1,000 mcg by mouth daily.   XIGDUO XR 11-998 MG TB24 Take 1 tablet by mouth daily.     ROS: Pertinent items noted in HPI and remainder of comprehensive ROS otherwise negative.  Labs/Other Tests and Data Reviewed:    Recent Labs: 11/10/2022: ALT 20; BUN 30; Creatinine, Ser 2.05; Hemoglobin 13.6; Magnesium  1.9; Platelets 251; Potassium 3.6; Sodium 140 05/18/2023: TSH 1.760   Recent Lipid Panel No results found for: CHOL, TRIG, HDL, CHOLHDL, LDLCALC, LDLDIRECT  Wt Readings from Last 3 Encounters:  05/18/23 235 lb (106.6 kg)  03/23/23 232 lb 2 oz (105.3 kg)  11/16/22 235 lb 12.8 oz (107 kg)     Exam:    Vital Signs:  There were no vitals taken for this visit.   General appearance: alert and no distress Lungs: No visual respiratory difficulty Abdomen: Overweight Extremities:  extremities normal, atraumatic, no cyanosis or edema Neurologic: Grossly normal  ASSESSMENT & PLAN:  Mixed dyslipidemia Type 2 diabetes Hyperaldosteronism CAC score of 174, 65th percentile (07/2019) Aortic aneurysm with a 45 mm sinus of Valsalva and 42-43 mm ascending aorta LVEF 60 to 65%, moderate LAE, grade 1 diastolic dysfunction (09/2022)  Dr. Arlana was referred for general cardiology needs as well as dyslipidemia.  He had his statin increased however have not seen any lipid profile within the last year.  Will go ahead and proceed with a lipid NMR and LP(a).  He has had some recent memory issues.  I am not certain it is the statin but 1 could consider that as a possible contributing factor.  He has chronic kidney disease.  His last creatinine was over 2 in April 24 but he is followed by Dr. Clayborn.  Will need to repeat his creatinine to see if he could have a CT scan safely.  He is aorta is dilated and needs follow-up.  It has been almost a year since he had his last evaluation of this.  A CT angio would be the best option however we may have to consider alternative such as MRI if renal function is an issue.  I would like to plan to see him back in the office in follow-up for general cardiology needs in about 6 months or sooner as necessary.  Thanks again for the kind referral.  Patient Risk:   After full review of this patients clinical status, I feel that they are at least moderate risk at this time.  Time:   Today, I have spent 25 minutes with the patient with telehealth technology discussing dyslipidemia, diabetes, hyperaldosteronism, aortic aneurysm, coronary calcification, risk factor modification.     Medication Adjustments/Labs and Tests Ordered: Current medicines are reviewed at length with the patient today.  Concerns regarding medicines are outlined above.   Tests Ordered: No orders of the defined types were placed in this encounter.   Medication Changes: No orders  of the defined types were placed in this encounter.   Disposition:  in 6 month(s)  Vinie KYM Maxcy, MD, [facility], FACP  [place]  [facility]  Medical Director of the Advanced Lipid Disorders &  Cardiovascular Risk Reduction Clinic Diplomate of the American Board of Clinical Lipidology Attending Cardiologist  Direct Dial: [phone]  Fax: [phone]  Website:  [email or web address]  Vinie JAYSON Maxcy, MD  08/05/2023 9:21 AM

## 2023-08-19 ENCOUNTER — Ambulatory Visit (HOSPITAL_BASED_OUTPATIENT_CLINIC_OR_DEPARTMENT_OTHER)
Admission: RE | Admit: 2023-08-19 | Discharge: 2023-08-19 | Disposition: A | Discharge status: Home / Self Care | Payer: Medicare Other | Source: Ambulatory Visit | Attending: Internal Medicine | Admitting: Internal Medicine

## 2023-08-19 DIAGNOSIS — I719 Aortic aneurysm of unspecified site, without rupture: Secondary | ICD-10-CM | POA: Diagnosis present

## 2023-08-19 LAB — NMR, LIPOPROFILE
Cholesterol, Total: 88 mg/dL — ABNORMAL LOW (ref 100–199)
HDL Particle Number: 27.4 umol/L — ABNORMAL LOW (ref >=30.5)
HDL-C: 35 mg/dL — ABNORMAL LOW (ref >39)
LDL Particle Number: 348 nmol/L (ref <1000)
LDL Size: 19.9 nmol — ABNORMAL LOW (ref >20.5)
LDL-C (NIH Calc): 37 mg/dL (ref 0–99)
LP-IR Score: 67 — ABNORMAL HIGH (ref <=45)
Small LDL Particle Number: 180 nmol/L (ref <=527)
Triglycerides: 75 mg/dL (ref 0–149)

## 2023-08-19 LAB — BASIC METABOLIC PANEL
BUN/Creatinine Ratio: 14 (ref 10–24)
BUN: 25 mg/dL (ref 8–27)
CO2: 21 mmol/L (ref 20–29)
Calcium: 9.2 mg/dL (ref 8.6–10.2)
Chloride: 108 mmol/L — ABNORMAL HIGH (ref 96–106)
Creatinine, Ser: 1.81 mg/dL — ABNORMAL HIGH (ref 0.76–1.27)
Glucose: 111 mg/dL — ABNORMAL HIGH (ref 70–99)
Potassium: 3.7 mmol/L (ref 3.5–5.2)
Sodium: 146 mmol/L — ABNORMAL HIGH (ref 134–144)
eGFR: 40 mL/min/{1.73_m2} — ABNORMAL LOW (ref >59)

## 2023-08-19 LAB — LIPOPROTEIN A (LPA): Lipoprotein (a): 61.4 nmol/L (ref <75.0)

## 2023-08-19 MED ORDER — IOHEXOL 350 MG/ML SOLN
75.0000 mL | Freq: Once | INTRAVENOUS | Status: AC | PRN
  Administered 2023-08-19: 75 mL via INTRAVENOUS

## 2023-08-22 ENCOUNTER — Other Ambulatory Visit (HOSPITAL_BASED_OUTPATIENT_CLINIC_OR_DEPARTMENT_OTHER): Payer: Self-pay | Admitting: *Deleted

## 2023-08-22 DIAGNOSIS — I719 Aortic aneurysm of unspecified site, without rupture: Secondary | ICD-10-CM

## 2023-08-25 ENCOUNTER — Telehealth: Payer: Self-pay | Admitting: Internal Medicine

## 2023-08-25 NOTE — Telephone Encounter (Signed)
Follow Up:   Patient wants to know if a Calcium was found from last week

## 2023-08-25 NOTE — Telephone Encounter (Signed)
Follow Up:     Patient wants to know if a Calcium results was found from his CT test that he had on 08-19-23?

## 2023-08-25 NOTE — Telephone Encounter (Signed)
I am not using this encounter, did not get to complete the message

## 2023-08-25 NOTE — Telephone Encounter (Signed)
Called and spoke to patient. Verified name and DOB. Patient is calling to get results for Calcium Score. He stated he thought one was ordered. No order seen and was not mentioned in last note. Please advise.

## 2023-08-29 ENCOUNTER — Encounter: Payer: Self-pay | Admitting: Internal Medicine

## 2023-08-30 NOTE — Telephone Encounter (Signed)
MD sent a message to patient via MyChart

## 2023-09-09 ENCOUNTER — Other Ambulatory Visit: Payer: Self-pay | Admitting: Student

## 2023-09-14 ENCOUNTER — Encounter: Payer: Self-pay | Admitting: Internal Medicine

## 2023-09-19 ENCOUNTER — Encounter: Payer: Self-pay | Admitting: Internal Medicine

## 2023-09-27 ENCOUNTER — Other Ambulatory Visit (INDEPENDENT_AMBULATORY_CARE_PROVIDER_SITE_OTHER): Payer: Self-pay

## 2023-09-27 ENCOUNTER — Ambulatory Visit (INDEPENDENT_AMBULATORY_CARE_PROVIDER_SITE_OTHER): Payer: Medicare Other | Admitting: Sports Medicine

## 2023-09-27 ENCOUNTER — Encounter: Payer: Self-pay | Admitting: Sports Medicine

## 2023-09-27 DIAGNOSIS — M19041 Primary osteoarthritis, right hand: Secondary | ICD-10-CM | POA: Diagnosis not present

## 2023-09-27 MED ORDER — TRIAMCINOLONE ACETONIDE 40 MG/ML IJ SUSP
20.0000 mg | Freq: Once | INTRAMUSCULAR | Status: AC
Start: 2023-09-27 — End: 2023-09-27
  Administered 2023-09-27: 20 mg via INTRAMUSCULAR

## 2023-09-27 NOTE — Assessment & Plan Note (Signed)
 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 Today we did a right second MCP injection with ultrasound guidance, return to see me in 6-weeks.

## 2023-09-27 NOTE — Addendum Note (Signed)
 Addended by: Samule Dry on: 09/27/2023 02:57 PM   Modules accepted: Orders

## 2023-09-27 NOTE — Progress Notes (Signed)
    Procedures performed today:    Procedure: Real-time Ultrasound Guided injection of the right second MCP Device: Samsung HS60  Verbal informed consent obtained.  Time-out conducted.  Noted no overlying erythema, induration, or other signs of local infection.  Skin prepped in a sterile fashion.  Local anesthesia: Topical Ethyl chloride.  With sterile technique and under real time ultrasound guidance: Arthritic joint synovitis noted, 0.5 cc lidocaine, 0.5 cc kenalog 40 injected easily. Advised to call if fevers/chills, erythema, induration, drainage, or persistent bleeding.  Images permanently stored and available for review in PACS.  Impression: Technically successful ultrasound guided injection.  Independent interpretation of notes and tests performed by another provider:   None.  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 Today we did a right second MCP injection with ultrasound guidance, return to see me in 6-weeks.    ____________________________________________ Ihor Austin. Benjamin Stain, M.D., ABFM., CAQSM., AME. Primary Care and Sports Medicine [facility]  Adjunct Professor of Family Medicine  [employer]  Restaurant manager, fast food

## 2023-10-04 DIAGNOSIS — R490 Dysphonia: Secondary | ICD-10-CM | POA: Insufficient documentation

## 2023-10-05 ENCOUNTER — Ambulatory Visit (HOSPITAL_BASED_OUTPATIENT_CLINIC_OR_DEPARTMENT_OTHER): Payer: Medicare Other | Admitting: Pulmonary Disease

## 2023-10-18 ENCOUNTER — Ambulatory Visit (INDEPENDENT_AMBULATORY_CARE_PROVIDER_SITE_OTHER): Admitting: Podiatry

## 2023-10-18 ENCOUNTER — Encounter: Payer: Self-pay | Admitting: Podiatry

## 2023-10-18 DIAGNOSIS — R234 Changes in skin texture: Secondary | ICD-10-CM | POA: Diagnosis not present

## 2023-10-18 MED ORDER — AMMONIUM LACTATE 12 % EX LOTN: 1.0000 | TOPICAL_LOTION | CUTANEOUS | 5 refills | Status: AC | PRN

## 2023-10-18 NOTE — Progress Notes (Signed)
 Subjective:  Patient ID: Casey Richer, MD, male    DOB: 1956-07-11,  MRN: [id number] HPI Chief Complaint  Patient presents with   Skin Problem    Bilateral feet - skin very dry and scaly, had pedicure 2 weeks ago and now has cracks on the outside heels (L>R) , very tender, trip to [place]    New Patient (Initial Visit)    Est pt 2021    68 y.o. male presents with the above complaint.   ROS: Denies fever chills nausea vomiting muscle aches pains calf pain back pain chest pain shortness of breath.  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HCC)    ongoing evaluation for elevated R>L adosterone secretion   Arthritis    Atrial enlargement, bilateral    Atrial fibrillation (HCC)    paroxysmal   Atrial flutter (HCC)    Complication of anesthesia    could not urinate after UHR, needed cath, mental fog sinc cervical fusion 08-09-16    Dysrhythmia    a-fib-had ablation x2   GERD (gastroesophageal reflux disease)    Hyperaldosteronism (HCC)    Hypertension    Kidney cysts    "multiple"   Nephrolithiasis    Pneumonia 1990's   Pre-diabetes    Prostatitis    Renal insufficiency    "Cr 1.6"   Past Surgical History:  Procedure Laterality Date   ABLATION     ATRIAL FIBRILLATION ABLATION N/A 09/28/2012   Procedure: ATRIAL FIBRILLATION ABLATION;  Surgeon: Hillis Range, MD;  Location: [facility] CATH LAB;  Service: Cardiovascular;  Laterality: N/A;   atrial fibrillation ablation with CTI ablation  05/27/11, 09/28/12   ablation for afib and atrial flutter by Dr Allyne Gee SURGERY  08/09/2016   C 5 6  C 6 to C 7 cervical fusion   CARDIOVERSION  07/28/2011   Procedure: CARDIOVERSION;  Surgeon: Lewayne Bunting, MD;  Location: [facility] OR;  Service: Cardiovascular;  Laterality: N/A;   COLONOSCOPY WITH PROPOFOL N/A 03/03/2015   Procedure: COLONOSCOPY WITH PROPOFOL;  Surgeon: Charolett Bumpers, MD;  Location: [facility]COPY;  Service: Gastroenterology;  Laterality: N/A;    double j stent placement  06/2004   left   ESOPHAGOGASTRODUODENOSCOPY (EGD) WITH PROPOFOL N/A 03/03/2015   Procedure: ESOPHAGOGASTRODUODENOSCOPY (EGD) WITH PROPOFOL;  Surgeon: Charolett Bumpers, MD;  Location: [facility] ENDOSCOPY;  Service: Gastroenterology;  Laterality: N/A;   ESOPHAGOGASTRODUODENOSCOPY (EGD) WITH PROPOFOL N/A 10/25/2016   Procedure: ESOPHAGOGASTRODUODENOSCOPY (EGD) WITH PROPOFOL;  Surgeon: Charolett Bumpers, MD;  Location: [facility] ENDOSCOPY;  Service: Endoscopy;  Laterality: N/A;   INGUINAL HERNIA REPAIR     "as a child; ? right"   INGUINAL HERNIA REPAIR Left 06/23/2016   Procedure: OPEN LEFT INGUINAL HERNIA  REPAIR WITH MESH;  Surgeon: Luretha Murphy, MD;  Location: [facility];  Service: General;  Laterality: Left;   INSERTION OF MESH Left 06/23/2016   Procedure: INSERTION OF MESH;  Surgeon: Luretha Murphy, MD;  Location: [facility];  Service: General;  Laterality: Left;   LEFT HEART CATH AND CORONARY ANGIOGRAPHY N/A 07/25/2018   Procedure: LEFT HEART CATH AND CORONARY ANGIOGRAPHY;  Surgeon: Lyn Records, MD;  Location: [facility] INVASIVE CV LAB;  Service: Cardiovascular;  Laterality: N/A;   LUNG BIOPSY  1993   NASAL SEPTOPLASTY W/ TURBINOPLASTY  07/2006   prostate biopsy  2011   schwannoma removal  ~01/2010   left thorax   TEE WITHOUT CARDIOVERSION N/A 09/27/2012   Procedure: TRANSESOPHAGEAL  ECHOCARDIOGRAM (TEE);  Surgeon: Laurey Morale, MD;  Location: [facility] ENDOSCOPY;  Service: Cardiovascular;  Laterality: N/A;   ULTRASOUND GUIDANCE FOR VASCULAR ACCESS  07/25/2018   Procedure: Ultrasound Guidance For Vascular Access;  Surgeon: Lyn Records, MD;  Location: [facility] INVASIVE CV LAB;  Service: Cardiovascular;;   UMBILICAL HERNIA REPAIR  07/2006    Current Outpatient Medications:    ammonium lactate (LAC-HYDRIN) 12 % lotion, Apply 1 Application topically as needed for dry skin., Disp: 400 g, Rfl: 5   allopurinol (ZYLOPRIM) 100 MG tablet, Take 100 mg by mouth 2 (two) times  daily., Disp: , Rfl:    apixaban (ELIQUIS) 5 MG TABS tablet, Take 1 tablet (5 mg total) by mouth 2 (two) times daily., Disp: 180 tablet, Rfl: 3   Ascorbic Acid (VITAMIN C) 1000 MG tablet, Take 1,000 mg by mouth 3 (three) times a week., Disp: , Rfl:    atorvastatin (LIPITOR) 20 MG tablet, Take 1 tablet (20 mg total) by mouth daily., Disp: 90 tablet, Rfl: 3   BREZTRI AEROSPHERE 160-9-4.8 MCG/ACT AERO, SMARTSIG:2 Puff(s) By Mouth Twice Daily, Disp: , Rfl:    Cholecalciferol (VITAMIN D-3 PO), Take by mouth., Disp: , Rfl:    CIALIS 20 MG tablet, Take 20 mg by mouth daily as needed for erectile dysfunction.  (Patient not taking: Reported on 09/27/2023), Disp: , Rfl: 11   diltiazem (CARDIZEM CD) 360 MG 24 hr capsule, Take 360 mg by mouth every evening., Disp: , Rfl: 7   eplerenone (INSPRA) 50 MG tablet, Take 1 tablet (50 mg total) by mouth 2 (two) times daily., Disp: 180 tablet, Rfl: 3   FIBER PO, Taking 2 gummy by mouth daily, Disp: , Rfl:    Glucosamine 500 MG CAPS, 1 capsule with a meal Orally twice a day for 30 days (Patient not taking: Reported on 09/27/2023), Disp: , Rfl:    hydrochlorothiazide (MICROZIDE) 12.5 MG capsule, Take 12.5 mg by mouth daily., Disp: , Rfl:    losartan (COZAAR) 100 MG tablet, Take 100 mg by mouth daily., Disp: , Rfl:    metFORMIN (GLUCOPHAGE) 500 MG tablet, Take 500 mg by mouth every morning., Disp: , Rfl:    metoprolol tartrate (LOPRESSOR) 25 MG tablet, Take 1 tablet by mouth every 8 hours as needed for afib HR over 100, Disp: 30 tablet, Rfl: 1   montelukast (SINGULAIR) 10 MG tablet, TAKE 1 TABLET(10 MG) BY MOUTH AT BEDTIME, Disp: 30 tablet, Rfl: 11   Multiple Vitamin (MULTIVITAMIN) capsule, Take 1 capsule by mouth 3 (three) times a week., Disp: , Rfl:    Multiple Vitamins-Minerals (ZINC PO), Take 1 tablet by mouth as needed., Disp: , Rfl:    MYRBETRIQ 50 MG TB24 tablet, Take 50 mg by mouth daily., Disp: , Rfl:    omeprazole (PRILOSEC) 40 MG capsule, Take 40 mg by mouth daily  before supper. , Disp: , Rfl:    potassium chloride SA (KLOR-CON M) 20 MEQ tablet, TAKE 2 TABLETS BY MOUTH TWICE DAILY TODAY(AT LEAST 2 HOURS APART) FOR A TOTAL OF 80 MEQ AND THEN START 1 TABLET(20 MEQ) ONCE DAILY THEREAFTER, Disp: 90 tablet, Rfl: 2   PROAIR HFA 108 (90 Base) MCG/ACT inhaler, SMARTSIG:2 Inhalation Via Inhaler Every 6 Hours PRN, Disp: , Rfl:    scopolamine (TRANSDERM-SCOP) 1 MG/3DAYS, Place 1 patch onto the skin as needed., Disp: , Rfl:    sertraline (ZOLOFT) 100 MG tablet, Take 100 mg by mouth daily. (Patient not taking: Reported on 09/27/2023), Disp: , Rfl:  tamsulosin (FLOMAX) 0.4 MG CAPS capsule, Take 0.4 mg by mouth every morning., Disp: , Rfl:    TRULICITY 0.75 MG/0.5ML SOAJ, Inject 0.75 mg into the skin once a week., Disp: , Rfl:    vitamin B-12 (CYANOCOBALAMIN) 1000 MCG tablet, Take 1,000 mcg by mouth daily., Disp: , Rfl:    XIGDUO XR 11-998 MG TB24, Take 1 tablet by mouth daily., Disp: , Rfl:   Allergies  Allergen Reactions   Aldactone [Spironolactone]     Breast tissue enlargement   Codeine Other (See Comments)    Headache    Keflex [Cephalexin] Diarrhea   Lisinopril Cough   Nexium [Esomeprazole Magnesium] Diarrhea   Sulfa Antibiotics Rash   Sulfa Drugs Cross Reactors Rash   Review of Systems Objective:  There were no vitals filed for this visit.  General: Well developed, nourished, in no acute distress, alert and oriented x3   Dermatological: Skin is warm, dry and supple bilateral. Nails x 10 are well maintained; remaining integument appears unremarkable at this time. There are no open sores, no preulcerative lesions, no rash or signs of infection present.  Dry xerotic skin with fissures to the lateral heels bilaterally.  Left worse than right does not appear to demonstrate any signs of tinea.  Vascular: Dorsalis Pedis artery and Posterior Tibial artery pedal pulses are 2/4 bilateral with immedate capillary fill time. Pedal hair growth present. No  varicosities and no lower extremity edema present bilateral.   Neruologic: Grossly intact via light touch bilateral. Vibratory intact via tuning fork bilateral. Protective threshold with Semmes Wienstein monofilament intact to all pedal sites bilateral. Patellar and Achilles deep tendon reflexes 2+ bilateral. No Babinski or clonus noted bilateral.   Musculoskeletal: No gross boney pedal deformities bilateral. No pain, crepitus, or limitation noted with foot and ankle range of motion bilateral. Muscular strength 5/5 in all groups tested bilateral.  Gait: Unassisted, Nonantalgic.    Radiographs:  None taken  Assessment & Plan:   Assessment: Xerosis with skin fissures.  Plan: AmLactin and O'Keefe's to be used under occlusion.  Remember to ask how his trip to [place] went.  Follow-up with him in a month or so.     Aveyah Greenwood T. [place], [place]

## 2023-10-23 ENCOUNTER — Other Ambulatory Visit: Payer: Self-pay | Admitting: Student

## 2023-11-14 ENCOUNTER — Other Ambulatory Visit (INDEPENDENT_AMBULATORY_CARE_PROVIDER_SITE_OTHER)

## 2023-11-14 ENCOUNTER — Ambulatory Visit

## 2023-11-14 ENCOUNTER — Encounter: Payer: Self-pay | Admitting: Sports Medicine

## 2023-11-14 ENCOUNTER — Ambulatory Visit (INDEPENDENT_AMBULATORY_CARE_PROVIDER_SITE_OTHER): Admitting: Sports Medicine

## 2023-11-14 DIAGNOSIS — M19041 Primary osteoarthritis, right hand: Secondary | ICD-10-CM

## 2023-11-14 DIAGNOSIS — M48062 Spinal stenosis, lumbar region with neurogenic claudication: Secondary | ICD-10-CM

## 2023-11-14 DIAGNOSIS — M19042 Primary osteoarthritis, left hand: Secondary | ICD-10-CM | POA: Diagnosis not present

## 2023-11-14 DIAGNOSIS — M180 Bilateral primary osteoarthritis of first carpometacarpal joints: Secondary | ICD-10-CM | POA: Diagnosis not present

## 2023-11-14 MED ORDER — TRIAMCINOLONE ACETONIDE 40 MG/ML IJ SUSP
40.0000 mg | Freq: Once | INTRAMUSCULAR | Status: AC
  Administered 2023-11-14: 40 mg via INTRAMUSCULAR

## 2023-11-14 NOTE — Patient Instructions (Signed)
 Casey [name]

## 2023-11-14 NOTE — Assessment & Plan Note (Signed)
 Very pleasant 68 year old male, we did a right second MCP injection at the last visit, he did really well, persistent pain bilateral first CMCs, not significantly better with analgesics, topical Voltaren, on Eliquis  so unable to do NSAIDs. Today we did bilateral first CMC injections, I would like him to do some physical therapy. Return to see me in 6 weeks.

## 2023-11-14 NOTE — Progress Notes (Signed)
    Procedures performed today:    Procedure: Real-time Ultrasound Guided injection of the right first [facility] Device: Samsung HS60  Verbal informed consent obtained.  Time-out conducted.  Noted no overlying erythema, induration, or other signs of local infection.  Skin prepped in a sterile fashion.  Local anesthesia: Topical Ethyl chloride.  With sterile technique and under real time ultrasound guidance: Arthritic joint noted, 0.5 cc lidocaine , 0.5 cc kenalog  40 injected easily. Completed without difficulty  Advised to call if fevers/chills, erythema, induration, drainage, or persistent bleeding.  Images permanently stored and available for review in PACS.  Impression: Technically successful ultrasound guided injection.   Procedure: Real-time Ultrasound Guided injection of the left first [facility] Device: Samsung HS60  Verbal informed consent obtained.  Time-out conducted.  Noted no overlying erythema, induration, or other signs of local infection.  Skin prepped in a sterile fashion.  Local anesthesia: Topical Ethyl chloride.  With sterile technique and under real time ultrasound guidance: Arthritic joint noted, 0.5 cc lidocaine , 0.5 cc kenalog  40 injected easily. Completed without difficulty  Advised to call if fevers/chills, erythema, induration, drainage, or persistent bleeding.  Images permanently stored and available for review in PACS.  Impression: Technically successful ultrasound guided injection.  Independent interpretation of notes and tests performed by another provider:   None.  Brief History, Exam, Impression, and Recommendations:    Primary osteoarthritis both carpometacarpal joints and right second metacarpophalangeal joint Very pleasant 68 year old male, we did a right second MCP injection at the last visit, he did really well, persistent pain bilateral first CMCs, not significantly better with analgesics, topical Voltaren, on Eliquis  so unable to do NSAIDs. Today we did  bilateral first CMC injections, I would like him to do some physical therapy. Return to see me in 6 weeks.  Spinal stenosis of lumbar region with neurogenic claudication Very pleasant 68 year old male, long history of low back, bilateral laterally referred hip pain, buttock pain. Worse with walking, better with flexion of the spine. I did personally review a CT abdomen and pelvis from several years ago that do show some lumbar spinal stenosis and disc disease. I would like updated x-rays, he will do some formal PT, if not better at the follow-up we will consider MRI and Neurontin .    ____________________________________________ Joselyn Nicely. Sandy Crumb, M.D., ABFM., CAQSM., AME. 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

## 2023-11-14 NOTE — Assessment & Plan Note (Signed)
 Very pleasant 68 year old male, long history of low back, bilateral laterally referred hip pain, buttock pain. Worse with walking, better with flexion of the spine. I did personally review a CT abdomen and pelvis from several years ago that do show some lumbar spinal stenosis and disc disease. I would like updated x-rays, he will do some formal PT, if not better at the follow-up we will consider MRI and Neurontin .

## 2023-11-14 NOTE — Addendum Note (Signed)
 Addended by: OLIVA-AVELLANEDA, Yusif Gnau L on: 11/14/2023 09:39 AM   Modules accepted: Orders

## 2023-12-08 ENCOUNTER — Other Ambulatory Visit: Payer: Self-pay

## 2023-12-08 ENCOUNTER — Encounter (HOSPITAL_BASED_OUTPATIENT_CLINIC_OR_DEPARTMENT_OTHER): Payer: Self-pay | Admitting: Physical Therapy

## 2023-12-08 ENCOUNTER — Telehealth: Payer: Self-pay | Admitting: Sports Medicine

## 2023-12-08 ENCOUNTER — Ambulatory Visit (HOSPITAL_BASED_OUTPATIENT_CLINIC_OR_DEPARTMENT_OTHER): Attending: Sports Medicine | Admitting: Physical Therapy

## 2023-12-08 ENCOUNTER — Ambulatory Visit (HOSPITAL_BASED_OUTPATIENT_CLINIC_OR_DEPARTMENT_OTHER): Payer: Medicare Other | Admitting: Pulmonary Disease

## 2023-12-08 DIAGNOSIS — M79642 Pain in left hand: Secondary | ICD-10-CM | POA: Insufficient documentation

## 2023-12-08 DIAGNOSIS — M19041 Primary osteoarthritis, right hand: Secondary | ICD-10-CM

## 2023-12-08 DIAGNOSIS — M6281 Muscle weakness (generalized): Secondary | ICD-10-CM | POA: Insufficient documentation

## 2023-12-08 DIAGNOSIS — M48062 Spinal stenosis, lumbar region with neurogenic claudication: Secondary | ICD-10-CM | POA: Diagnosis not present

## 2023-12-08 DIAGNOSIS — M545 Low back pain, unspecified: Secondary | ICD-10-CM | POA: Diagnosis present

## 2023-12-08 DIAGNOSIS — M25541 Pain in joints of right hand: Secondary | ICD-10-CM | POA: Diagnosis present

## 2023-12-08 DIAGNOSIS — M79641 Pain in right hand: Secondary | ICD-10-CM | POA: Diagnosis present

## 2023-12-08 DIAGNOSIS — M25542 Pain in joints of left hand: Secondary | ICD-10-CM | POA: Insufficient documentation

## 2023-12-08 NOTE — Telephone Encounter (Signed)
-----   Message from Silver Dross sent at 12/08/2023 12:56 PM EDT ----- Regarding: OT/Hand therapy Hey Dr. Elva Hamburger,  Just saw this patient this morning. Given the level of difficulty he has with fine motor tasks with his hands, he may benefit from an OT or specialized hand PT visit or too for HEP and bracing discussion. We unfortunately don't have that in our office but [facility] does have an OT on staff. Just a thought. Pt seemed receptive to at least a consultation visit.   Best, Silver Dross PT, DPT 12/08/23 12:58 PM

## 2023-12-08 NOTE — Therapy (Addendum)
 OUTPATIENT PHYSICAL THERAPY LOWER EXTREMITY EVALUATION   Patient Name: Casey QUINTANA, MD MRN: [id number] DOB:10-29-1955, 68 y.o., male Today's Date: 12/08/2023  END OF SESSION:  PT End of Session - 12/08/23 1238     Visit Number 1    Number of Visits 11    Date for PT Re-Evaluation 03/07/24    Authorization Type MCR A and B    PT Start Time 1021   late   PT Stop Time 1100    PT Time Calculation (min) 39 min    Activity Tolerance Patient tolerated treatment well    Behavior During Therapy WFL for tasks assessed/performed              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HCC)    ongoing evaluation for elevated R>L adosterone secretion   Arthritis    Atrial enlargement, bilateral    Atrial fibrillation (HCC)    paroxysmal   Atrial flutter (HCC)    Complication of anesthesia    could not urinate after UHR, needed cath, mental fog sinc cervical fusion 08-09-16    Dysrhythmia    a-fib-had ablation x2   GERD (gastroesophageal reflux disease)    Hyperaldosteronism (HCC)    Hypertension    Kidney cysts    "multiple"   Nephrolithiasis    Pneumonia 1990's   Pre-diabetes    Prostatitis    Renal insufficiency    "Cr 1.6"   Past Surgical History:  Procedure Laterality Date   ABLATION     ATRIAL FIBRILLATION ABLATION N/A 09/28/2012   Procedure: ATRIAL FIBRILLATION ABLATION;  Surgeon: Jolly Needle, MD;  Location: [facility];  Service: Cardiovascular;  Laterality: N/A;   atrial fibrillation ablation with CTI ablation  05/27/11, 09/28/12   ablation for afib and atrial flutter by Dr Pennye Boy SURGERY  08/09/2016   C 5 6  C 6 to C 7 cervical fusion   CARDIOVERSION  07/28/2011   Procedure: CARDIOVERSION;  Surgeon: Lenise Quince, MD;  Location: [facility] OR;  Service: Cardiovascular;  Laterality: N/A;   COLONOSCOPY WITH PROPOFOL  N/A 03/03/2015   Procedure: COLONOSCOPY WITH PROPOFOL ;  Surgeon: Garrett Kallman, MD;  Location: [facility];   Service: Gastroenterology;  Laterality: N/A;   double j stent placement  06/2004   left   ESOPHAGOGASTRODUODENOSCOPY (EGD) WITH PROPOFOL  N/A 03/03/2015   Procedure: ESOPHAGOGASTRODUODENOSCOPY (EGD) WITH PROPOFOL ;  Surgeon: Garrett Kallman, MD;  Location: [facility];  Service: Gastroenterology;  Laterality: N/A;   ESOPHAGOGASTRODUODENOSCOPY (EGD) WITH PROPOFOL  N/A 10/25/2016   Procedure: ESOPHAGOGASTRODUODENOSCOPY (EGD) WITH PROPOFOL ;  Surgeon: Garrett Kallman, MD;  Location: [facility];  Service: Endoscopy;  Laterality: N/A;   INGUINAL HERNIA REPAIR     "as a child; ? right"   INGUINAL HERNIA REPAIR Left 06/23/2016   Procedure: OPEN LEFT INGUINAL HERNIA  REPAIR WITH MESH;  Surgeon: Jacolyn Matar, MD;  Location: [facility];  Service: General;  Laterality: Left;   INSERTION OF MESH Left 06/23/2016   Procedure: INSERTION OF MESH;  Surgeon: Jacolyn Matar, MD;  Location: [facility];  Service: General;  Laterality: Left;   LEFT HEART CATH AND CORONARY ANGIOGRAPHY N/A 07/25/2018   Procedure: LEFT HEART CATH AND CORONARY ANGIOGRAPHY;  Surgeon: Arty Binning, MD;  Location: [facility];  Service: Cardiovascular;  Laterality: N/A;   LUNG BIOPSY  1993   NASAL SEPTOPLASTY W/ TURBINOPLASTY  07/2006   prostate biopsy  2011  schwannoma removal  ~01/2010   left thorax   TEE WITHOUT CARDIOVERSION N/A 09/27/2012   Procedure: TRANSESOPHAGEAL ECHOCARDIOGRAM (TEE);  Surgeon: Darlis Eisenmenger, MD;  Location: [facility] ENDOSCOPY;  Service: Cardiovascular;  Laterality: N/A;   ULTRASOUND GUIDANCE FOR VASCULAR ACCESS  07/25/2018   Procedure: Ultrasound Guidance For Vascular Access;  Surgeon: Arty Binning, MD;  Location: [facility] INVASIVE CV LAB;  Service: Cardiovascular;;   UMBILICAL HERNIA REPAIR  07/2006   Patient Active Problem List   Diagnosis Date Noted   Spinal stenosis of lumbar region with neurogenic claudication 11/14/2023   Hoarseness 10/04/2023   Primary osteoarthritis both  carpometacarpal joints and right second metacarpophalangeal joint 09/27/2023   Hypercoagulable state due to paroxysmal atrial fibrillation (HCC) 11/10/2022   Asthma, intermittent 11/01/2022   Status post implantation of urinary electronic stimulator device 10/12/2022   Trochanteric bursitis, left hip 02/22/2022   Bilateral impacted cerumen 10/23/2020   Laryngopharyngeal reflux (LPR) 10/23/2020   Vocal cord nodule 10/23/2020   Bilateral elbow joint pain 06/25/2020   Lateral epicondylitis of left elbow 06/25/2020   Moderate COPD (chronic obstructive pulmonary disease) (HCC) 03/26/2020   OSA (obstructive sleep apnea) 03/26/2020   Pulmonary emboli (HCC) 03/14/2019   Chronic respiratory failure with hypoxia (HCC) 08/18/2018   Hyperlipidemia 08/17/2018   CKD (chronic kidney disease), stage II 08/16/2018   Abnormal ECG 07/25/2018   Incontinence of feces 04/18/2018   Urge incontinence of urine 03/11/2018   Renal insufficiency    Prostatitis    Pre-diabetes    Nephrolithiasis    Kidney cysts    Hyperaldosteronism (HCC)    GERD (gastroesophageal reflux disease)    Dysrhythmia    Complication of anesthesia    Atrial enlargement, bilateral    Arthritis    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HCC)    Inguinal hernia of left side without obstruction or gangrene Nov 2017 06/23/2016   Adjustment disorder with depressed mood 05/11/2016   Atrial fibrillation (HCC) 03/18/2011   Atrial flutter (HCC) 03/18/2011   Hypertension 03/18/2011     REFERRING PROVIDER: Gean Keels, MD  REFERRING DIAG:  Diagnosis  M19.041 (ICD-10-CM) - Primary osteoarthritis, right hand  M48.062 (ICD-10-CM) - Spinal stenosis of lumbar region with neurogenic claudication    THERAPY DIAG:  Pain, lumbar region  Muscle weakness (generalized)  Bilateral hand pain  Rationale for Evaluation and Treatment: Rehabilitation  ONSET DATE: 2023+   SUBJECTIVE:   SUBJECTIVE STATEMENT:  Pt reports he has bilat  hand pain as well as history of degenerative changes at the back. Pt reports MCP joint pain into both hands. Recently had injections into bilat CMC joints.  Pt does have hand weakness into both sides with R slightly worse than L. Pt works with a Systems analyst 2x/wk.  Pt performs squats with trainer and is very sore the 2nd and 3rd day. Pt does have peripheral neuropathy.   Pt does have some pain and limitation with extended walking.  He typically walks 5-6 miles per week with his wife.  Pt walks and does have some limitation due to fatigue. Pt denies radiating pain, distal thigh NT, or red flag symptoms.  Pt able to play pickleball without limitation.   PERTINENT HISTORY: -A-fib with a hx of ablation x 2, Hx of PE which may have led to chronic pulmonary issues -incontinence--Pt has a sacral nerve stimulator  -Paralyzed left diaphragm per pt, arthritis, pre-diabetic, cervical fusion, and hernia repairs -Pt uses bilat hearing aides  PAIN:  NPRS:  4/10 Worst, 2/10 Current,  1/10 Best Location:  bilat hand, buttocks/LBP  Agg factors: playing piano and basoon; holding weights; carry/lifting, putting on support hose  Relieve: sitting, resting, changing positions, stretching    PRECAUTIONS: Other: sacral nerve stimulator--No E-stim; cervical fusion.  WEIGHT BEARING RESTRICTIONS: No  FALLS:  Has patient fallen in last 6 months? No  LIVING ENVIRONMENT: Lives with: lives with their spouse Lives in: 2 story home Stairs: yes.   Has following equipment at home: none  OCCUPATION: retired ENT.  PLOF: Independent;    PATIENT GOALS:  be able to play tennis and pickleball; continue with exercise; return to playing musical instruments    OBJECTIVE:   DOBJECTIVE:  Note: Objective measures were completed at Evaluation unless otherwise noted.   UEFI: Upper Extremity Function Scale: 40 / 80 = 50.0 % ODI: Modified Oswestry Low Back Pain Disability Questionnaire: 15 / 50 = 30.0  %  DIAGNOSTIC FINDINGS:  LUMBAR XR IMPRESSION: 1. Moderate L1-2 and L3-4 degenerative disc and endplate changes, similar to prior. 2. Moderate L5-S1 facet joint degenerative changes. 3. Mild chronic anterior superior L1 vertebral body height loss.     Electronically Signed   By: Bertina Broccoli M.D.   On: 11/17/2023 08:38   IR HAND XR IMPRESSION: 1. Moderate triscaphe and moderate to severe thumb carpometacarpal osteoarthritis. 2. Severe second and moderate third metacarpophalangeal osteoarthritis.     Electronically Signed   By: Bertina Broccoli M.D.   On: 11/17/2023 08:32   L HAND XR IMPRESSION: 1. Severe triscaphe and thumb carpometacarpal osteoarthritis. 2. Moderate to severe index finger osteoarthritis.     Electronically Signed   By: Bertina Broccoli M.D.   On: 11/17/2023 08:31 OUTCOME MEASURE: ODI    SCREENING FOR RED FLAGS: Bowel or bladder incontinence: No Spinal tumors: No Cauda equina syndrome: No  Ongoing and long term fecal and urinary urgency     COGNITION: Overall cognitive status: Within functional limits for tasks assessed                          SENSATION: Light touch: Impaired at shin   MUSCLE LENGTH: Hamstrings: limited supine 90/90    POSTURE: rounded shoulders, forward head, increased thoracic kyphosis, flexed trunk , and weight shift right   PALPATION: TTP of bilat lumbar paraspinals and QL and joint stiffness extension and rotation bilat L1-5; decreased muscle mass of glutes bilat; TTP of bilat glutes and hip rotators    LUMBAR ROM:    AROM eval  Flexion 75%   Extension 0%   Right lateral flexion 50%  Left lateral flexion 50%   Right rotation 75%  Left rotation 75%   (Blank rows = not tested)  Finger ROM: limited finger extension and flexion with tightness at end range; difficulty with finger opposition;   Consider grip strength test at next- deferred due to time today   LOWER EXTREMITY ROM:   moderately limited in all planes  at hip due to hip stiffness   LOWER EXTREMITY MMT:  deferred    LUMBAR SPECIAL TESTS:  Straight leg raise test: negative, Slump test: negative,  FABER test: negative    GAIT: Distance walked: 40ft Assistive device utilized: None Level of assistance: Complete Independence Comments: L T/S SB, toe out, decreased lumbar and hip rotation bilat, decreased step length, lack of hip extension bilat   TODAY'S TREATMENT:  DATE:   5/15  Exercises - Bridge with Hip Abduction and Resistance  - 1-2 x daily - 7 x weekly - 2 sets - 10 reps - Seated Quadratus Lumborum Stretch in Chair  - 2 x daily - 7 x weekly - 1 sets - 3 reps - 30 hold - Jefferson Curl  - 2 x daily - 7 x weekly - 1 sets - 5 reps - Putty Squeezes  - 2 x daily - 7 x weekly - 1 sets - 20 reps - Resisted Finger Extension and Thumb Abduction  - 2 x daily - 7 x weekly - 1 sets - 10 reps - Seated Finger Composite Flexion Extension  - 2 x daily - 7 x weekly - 2 sets - 10 reps   PATIENT EDUCATION:  Education details: MOI, diagnosis, prognosis, anatomy, exercise progression, DOMS expectations, muscle firing,  envelope of function, HEP, POC   Person educated: Patient Education method: Explanation, Demonstration, Tactile cues, Verbal cues, and Handouts Education comprehension: verbalized understanding, returned demonstration, verbal cues required, tactile cues required, and needs further education   HOME EXERCISE PROGRAM:      Access Code: [email or web address] URL: [email or web address] Date: 12/08/2023 Prepared by: Silver Dross   ASSESSMENT:   CLINICAL IMPRESSION: Patient is a 68 y.o. male who was seen today for physical therapy evaluation and treatment for c/c of LBP and hand/finger pain. Pt's s/s appear consistent with mechanical LBP due to joint and muscle stiffness- mild radicular pain. Hand pain  consistent with history of hand OA. Pt's is more stiffness dominant at this time. Given nature and progression of hand deficits, pt would like benefit from subsequent OT/hand therapist visits for specialized fine motor and hand strengthening as pt does have complaints with activities such as buttons, door knobs, and jars. Pt would benefit from continued skilled therapy in order to reach goals and maximize functional lumbar strength and ROM for prevention of further functional decline.   OBJECTIVE IMPAIRMENTS: Abnormal gait, decreased activity tolerance, decreased endurance, decreased mobility, difficulty walking, decreased ROM, decreased strength, hypomobility, impaired flexibility, impaired sensation, improper body mechanics, postural dysfunction, and pain.    ACTIVITY LIMITATIONS: carrying, lifting, bending, sitting, standing, squatting, stairs, transfers, and locomotion level   PARTICIPATION LIMITATIONS: cleaning, laundry, interpersonal relationship, driving, shopping, community activity, yard work, and exercise   PERSONAL FACTORS: Age, Time since onset of injury/illness/exacerbation, and 1 comorbiditie(s):    are also affecting patient's functional outcome.    REHAB POTENTIAL: fair   CLINICAL DECISION MAKING: unstable/complicated   EVALUATION COMPLEXITY: moderate     GOALS:     SHORT TERM GOALS: Target date: 01/19/2024     Pt will become independent with HEP in order to demonstrate synthesis of PT education.    Goal status: INITIAL   2.  Pt will report at least 2 pt reduction on NPRS scale for pain in order to demonstrate functional improvement with household activity, self care, and ADL.    Goal status: INITIAL   3.  Pt will have an at least 9 pt improvement in UEFI measure in order to demonstrate MCID improvement in daily function.   Goal status: INITIAL     LONG TERM GOALS: Target date: 03/01/2024    Pt  will become independent with final HEP in order to demonstrate  synthesis of PT education.   Goal status: INITIAL   2.  Pt will demonstrate at least a 12.8 improvement in Oswestry Index in order to demonstrate a clinically significant change in LBP and  function.    Goal status: INITIAL   3.  Pt will be able to demonstrate/report ability to walk >30 mins without pain in order to demonstrate functional improvement and tolerance to exercise and community mobility.    Goal status: INITIAL   4.  Pt will be able to demonstrate ability to perform 90 lbs and 88lbs of grip strength in R and L respectively in order to demonstrate functional improvement in UE function for self-care and house hold duties.    Goal status: INITIAL   5. Pt will be able to lift/squat/hold >25 lbs in order to demonstrate functional improvement in lumbopelvic strength for exercise, travel, and ADL.    Goal status: INITIAL   PLAN:   PT FREQUENCY: 1-2x/week   PT DURATION: 12 weeks (likely D/C by 8 wks)   PLANNED INTERVENTIONS: Therapeutic exercises, Therapeutic activity, Neuromuscular re-education, Balance training, Gait training, Patient/Family education, Self Care, Joint mobilization, Joint manipulation, Stair training, Orthotic/Fit training, DME instructions, Aquatic Therapy, Dry Needling, Electrical stimulation, Spinal manipulation, Spinal mobilization, Cryotherapy, Moist heat, scar mobilization, Splintting, Taping, Vasopneumatic device, Traction, Ultrasound, Ionotophoresis 4mg /ml Dexamethasone , Manual therapy, and Re-evaluation.   PLAN FOR NEXT SESSION:  lumbar mobility, lumbopelvic control, abdominal bracing technique, grip strength, finger and thumb mobility   Silver Dross PT, DPT 12/08/23 12:54 PM

## 2023-12-09 ENCOUNTER — Telehealth: Payer: Self-pay | Admitting: Internal Medicine

## 2023-12-09 ENCOUNTER — Encounter (INDEPENDENT_AMBULATORY_CARE_PROVIDER_SITE_OTHER): Payer: Self-pay | Admitting: Sports Medicine

## 2023-12-09 DIAGNOSIS — M19041 Primary osteoarthritis, right hand: Secondary | ICD-10-CM

## 2023-12-09 NOTE — Telephone Encounter (Signed)
 Pt requesting a cb to discuss if calcium  score has been/needs to be done

## 2023-12-09 NOTE — Telephone Encounter (Signed)

## 2023-12-09 NOTE — Telephone Encounter (Signed)
 Patient identification verified by 2 forms. Sims Duck, RN     Called patient. No answer. LVMTCB

## 2023-12-12 ENCOUNTER — Ambulatory Visit: Admitting: Rehabilitative and Restorative Service Providers"

## 2023-12-12 ENCOUNTER — Encounter: Payer: Self-pay | Admitting: Rehabilitative and Restorative Service Providers"

## 2023-12-12 DIAGNOSIS — M25542 Pain in joints of left hand: Secondary | ICD-10-CM | POA: Diagnosis not present

## 2023-12-12 DIAGNOSIS — R278 Other lack of coordination: Secondary | ICD-10-CM | POA: Diagnosis not present

## 2023-12-12 DIAGNOSIS — M25541 Pain in joints of right hand: Secondary | ICD-10-CM

## 2023-12-12 DIAGNOSIS — M6281 Muscle weakness (generalized): Secondary | ICD-10-CM

## 2023-12-12 NOTE — Patient Instructions (Signed)

## 2023-12-12 NOTE — Therapy (Signed)
 OUTPATIENT OCCUPATIONAL THERAPY ORTHO EVALUATION  Patient Name: Casey HALLORAN, MD MRN: [id number] DOB:October 02, 1955, 68 y.o., male Today's Date: 12/12/2023  PCP: Ardella Konig MD REFERRING PROVIDER: Gean Keels, MD   END OF SESSION:  OT End of Session - 12/12/23 [phone]     Visit Number 1    Number of Visits 6    Date for OT Re-Evaluation 01/13/24    Authorization Type Medicare    OT Start Time 1191    OT Stop Time 0854    OT Time Calculation (min) 42 min    Activity Tolerance Patient tolerated treatment well;No increased pain;Patient limited by pain    Behavior During Therapy [facility] for tasks assessed/performed             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HCC)    ongoing evaluation for elevated R>L adosterone secretion   Arthritis    Atrial enlargement, bilateral    Atrial fibrillation (HCC)    paroxysmal   Atrial flutter (HCC)    Complication of anesthesia    could not urinate after UHR, needed cath, mental fog sinc cervical fusion 08-09-16    Dysrhythmia    a-fib-had ablation x2   GERD (gastroesophageal reflux disease)    Hyperaldosteronism (HCC)    Hypertension    Kidney cysts    "multiple"   Nephrolithiasis    Pneumonia 1990's   Pre-diabetes    Prostatitis    Renal insufficiency    "Cr 1.6"   Past Surgical History:  Procedure Laterality Date   ABLATION     ATRIAL FIBRILLATION ABLATION N/A 09/28/2012   Procedure: ATRIAL FIBRILLATION ABLATION;  Surgeon: Jolly Needle, MD;  Location: [facility] CATH LAB;  Service: Cardiovascular;  Laterality: N/A;   atrial fibrillation ablation with CTI ablation  05/27/11, 09/28/12   ablation for afib and atrial flutter by Dr Pennye Boy SURGERY  08/09/2016   C 5 6  C 6 to C 7 cervical fusion   CARDIOVERSION  07/28/2011   Procedure: CARDIOVERSION;  Surgeon: Lenise Quince, MD;  Location: [facility] OR;  Service: Cardiovascular;  Laterality: N/A;   COLONOSCOPY WITH PROPOFOL  N/A 03/03/2015    Procedure: COLONOSCOPY WITH PROPOFOL ;  Surgeon: Garrett Kallman, MD;  Location: [facility] ENDOSCOPY;  Service: Gastroenterology;  Laterality: N/A;   double j stent placement  06/2004   left   ESOPHAGOGASTRODUODENOSCOPY (EGD) WITH PROPOFOL  N/A 03/03/2015   Procedure: ESOPHAGOGASTRODUODENOSCOPY (EGD) WITH PROPOFOL ;  Surgeon: Garrett Kallman, MD;  Location: [facility] ENDOSCOPY;  Service: Gastroenterology;  Laterality: N/A;   ESOPHAGOGASTRODUODENOSCOPY (EGD) WITH PROPOFOL  N/A 10/25/2016   Procedure: ESOPHAGOGASTRODUODENOSCOPY (EGD) WITH PROPOFOL ;  Surgeon: Garrett Kallman, MD;  Location: [facility] ENDOSCOPY;  Service: Endoscopy;  Laterality: N/A;   INGUINAL HERNIA REPAIR     "as a child; ? right"   INGUINAL HERNIA REPAIR Left 06/23/2016   Procedure: OPEN LEFT INGUINAL HERNIA  REPAIR WITH MESH;  Surgeon: Jacolyn Matar, MD;  Location: [facility];  Service: General;  Laterality: Left;   INSERTION OF MESH Left 06/23/2016   Procedure: INSERTION OF MESH;  Surgeon: Jacolyn Matar, MD;  Location: [facility];  Service: General;  Laterality: Left;   LEFT HEART CATH AND CORONARY ANGIOGRAPHY N/A 07/25/2018   Procedure: LEFT HEART CATH AND CORONARY ANGIOGRAPHY;  Surgeon: Arty Binning, MD;  Location: [facility] INVASIVE CV LAB;  Service: Cardiovascular;  Laterality: N/A;   LUNG BIOPSY  1993   NASAL SEPTOPLASTY W/ TURBINOPLASTY  07/2006   prostate biopsy  2011   schwannoma removal  ~01/2010   left thorax   TEE WITHOUT CARDIOVERSION N/A 09/27/2012   Procedure: TRANSESOPHAGEAL ECHOCARDIOGRAM (TEE);  Surgeon: Darlis Eisenmenger, MD;  Location: [facility] ENDOSCOPY;  Service: Cardiovascular;  Laterality: N/A;   ULTRASOUND GUIDANCE FOR VASCULAR ACCESS  07/25/2018   Procedure: Ultrasound Guidance For Vascular Access;  Surgeon: Arty Binning, MD;  Location: [facility] INVASIVE CV LAB;  Service: Cardiovascular;;   UMBILICAL HERNIA REPAIR  07/2006   Patient Active Problem List   Diagnosis Date Noted   Spinal stenosis of lumbar region  with neurogenic claudication 11/14/2023   Hoarseness 10/04/2023   Primary osteoarthritis both carpometacarpal joints and right second metacarpophalangeal joint 09/27/2023   Hypercoagulable state due to paroxysmal atrial fibrillation (HCC) 11/10/2022   Asthma, intermittent 11/01/2022   Status post implantation of urinary electronic stimulator device 10/12/2022   Trochanteric bursitis, left hip 02/22/2022   Bilateral impacted cerumen 10/23/2020   Laryngopharyngeal reflux (LPR) 10/23/2020   Vocal cord nodule 10/23/2020   Bilateral elbow joint pain 06/25/2020   Lateral epicondylitis of left elbow 06/25/2020   Moderate COPD (chronic obstructive pulmonary disease) (HCC) 03/26/2020   OSA (obstructive sleep apnea) 03/26/2020   Pulmonary emboli (HCC) 03/14/2019   Chronic respiratory failure with hypoxia (HCC) 08/18/2018   Hyperlipidemia 08/17/2018   CKD (chronic kidney disease), stage II 08/16/2018   Abnormal ECG 07/25/2018   Incontinence of feces 04/18/2018   Urge incontinence of urine 03/11/2018   Renal insufficiency    Prostatitis    Pre-diabetes    Nephrolithiasis    Kidney cysts    Hyperaldosteronism (HCC)    GERD (gastroesophageal reflux disease)    Dysrhythmia    Complication of anesthesia    Atrial enlargement, bilateral    Arthritis    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HCC)    Inguinal hernia of left side without obstruction or gangrene Nov 2017 06/23/2016   Adjustment disorder with depressed mood 05/11/2016   Atrial fibrillation (HCC) 03/18/2011   Atrial flutter (HCC) 03/18/2011   Hypertension 03/18/2011    ONSET DATE: chronic   REFERRING DIAG: M19.041 (ICD-10-CM) - Primary osteoarthritis, right hand   THERAPY DIAG:  Pain in joint of right hand  Pain in joint of left hand  Muscle weakness (generalized)  Other lack of coordination  Rationale for Evaluation and Treatment: Rehabilitation  SUBJECTIVE:   SUBJECTIVE STATEMENT: He shows up bit late for eval, states  he plays the piano and the bassoon, is very active playing pickle ball, etc.  He has been having pain and problems doing small buttons, gripping objects, using his hand for instruments, etc.  He did bring several braces today including old orthoses and prefabricated braces.  He has questions about pain management.  He states he has no current stretch program for his hands.  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     PERTINENT HISTORY: Pre PT note: "Pt reports he has bilat hand pain as well as history of degenerative changes at the back. Pt reports MCP joint pain into both hands. Recently had injections into bilat CMC joints.  Pt does have hand weakness into both sides with R slightly worse than L. Pt works with a Systems analyst 2x/wk.  "  Past cervical fusion   PRECAUTIONS: None  RED FLAGS: None   WEIGHT BEARING RESTRICTIONS: No  PAIN:  Are you having pain? Yes: NPRS scale: 1/10 at rest, up to 6/10 at  worst in the past week, intermittent  Pain location: Bilateral wrists and thumbs Pain description: Aching and sore Aggravating factors: Strong gripping and squeezing Relieving factors: Rest  FALLS: Has patient fallen in last 6 months? Yes. Number of falls 1 during pickleball last week- not a fall risk   LIVING ENVIRONMENT: Lives with: lives with their family Lives in: House/apartment Has following equipment at home: Multiple hand and thumb braces  PLOF: Independent  PATIENT GOALS: To get relief from bilateral hand and thumb and wrist pain  NEXT MD VISIT: As needed   OBJECTIVE: (All objective assessments below are from initial evaluation on: 12/12/23 unless otherwise specified.)   HAND DOMINANCE: Right   ADLs: Overall ADLs: States decreased ability to grab, hold household objects, pain and difficulty to open containers, perform FMS tasks (manipulate fasteners on clothing)   FUNCTIONAL OUTCOME MEASURES: Eval: Patient Specific Functional  Scale: 6.8 (playing piano, playing bassoon, shaving, opening jars, putting on TED hose)  (Higher Score  =  Better Ability for the Selected Tasks)      UPPER EXTREMITY ROM     Shoulder to Wrist AROM Right eval Left eval  Forearm supination    Forearm pronation     Wrist flexion 75 71  Wrist extension 69 71  Wrist ulnar deviation    Wrist radial deviation    (Blank rows = not tested)   Hand AROM Right eval Left eval  Full Fist Ability (or Gap to Distal Palmar Crease) Full Full  Thumb Opposition  (Kapandji Scale)  9/10 9/10  Thumb MCP (0-60) 47 43  Thumb IP (0-80) 58 42  Thumb Radial Abduction Span 48 44   Thumb Palmar Abduction Span     Index MCP (0-90)     Index PIP (0-100)     Index DIP (0-70)      Long MCP (0-90)      Long PIP (0-100)      Long DIP (0-70)      Ring MCP (0-90)      Ring PIP (0-100)      Ring DIP (0-70)      Little MCP (0-90)      Little PIP (0-100)      Little DIP (0-70)      (Blank rows = not tested)   UPPER EXTREMITY MMT:    Eval:  NT at eval, will be tested when appropriate.   MMT Right TBD Left TBD  Shoulder flexion    Shoulder abduction    Shoulder adduction    Shoulder extension    Shoulder internal rotation    Shoulder external rotation    Middle trapezius    Lower trapezius    Elbow flexion    Elbow extension    Forearm supination    Forearm pronation    Wrist flexion    Wrist extension    Wrist ulnar deviation    Wrist radial deviation    (Blank rows = not tested)  HAND FUNCTION: Eval: Observed weakness in affected Lt hand.  Grip strength Right: 70 lbs, Left: 56 lbs   COORDINATION: Eval: Observed coordination impairments with affected Lt hand. 9 Hole Peg Test Right: 24sec, Left: 27 sec (approximately 23 or 24 sec is WFL)   SENSATION: Eval:  Light touch intact today no paresthesia complaints  EDEMA:   Eval:  Mildly swollen in MCP joint of right thumb, index, middle fingers, also mildly in bilateral wrists  today  COGNITION: Eval: Overall cognitive status: WFL for evaluation today  OBSERVATIONS:   Eval: Right thumb MCP joint has EPL subluxation and ulnar drift apparently old injuries.  CMC joints are both collapsing somewhat and wrists are both grinding with some crepitus felt in left wrist is stiffer than right wrist.  He also has positive test of intrinsic tightness   TODAY'S TREATMENT:  Post-evaluation treatment:   For safety/self-care he was given information on managing hand arthritis as listed under patient instructions.  These were reviewed with him somewhat today.  Next, he was given the following home exercise program to perform 2 or 3 times a day, ideally after using moist heat for 2 or 3 minutes.  They were performed with him quickly today to ensure understanding and nonpainful stretches were felt.  He leaves stating understanding that these will likely need reviewed in upcoming sessions.   Exercises - Seated Wrist Flexion Stretch  - 3 x daily - 3 reps - 15 hold - Wrist Prayer Stretch  - 3 x daily - 3 reps - 15 sec hold - Stretch thumb toward base of small finger (put hand in LAP)  - 2-3 x daily - 3-5 reps - 15 sec hold - Thumb PROM Composite Extension  - 2-3 x daily - 3 reps - 15 sec hold - Thumb Webspace Stretch  - 3 x daily - 3 reps - 15 sec hold - Towel Roll Grip with Forearm in Neutral  - 3 x daily - 5 reps - 10 sec hold  PATIENT EDUCATION: Education details: See tx section above for details  Person educated: Patient Education method: Verbal Instruction, Teach back, Handouts  Education comprehension: States and demonstrates understanding, Additional Education required    HOME EXERCISE PROGRAM: Access Code: [email or web address] URL: [email or web address] Date: 12/12/2023 Prepared by: Leartis Proud     GOALS: Goals reviewed with patient? Yes   SHORT TERM GOALS: (STG required if POC>30 days) Target Date: 12/23/2023  Pt will demo/state understanding of  initial HEP to improve pain levels and prerequisite motion. Goal status: INITIAL   LONG TERM GOALS: Target Date: 01/13/2024  Pt will improve functional ability by decreased impairment per PSFS assessment from 6.8 to 8 or better, for better quality of life. Goal status: INITIAL  2.  Pt will improve grip strength in left hand from 56 lbs to at least 60 to lbs for functional use at home and in IADLs. Goal status: INITIAL  3.  Pt will improve A/ROM in right wrist extension from 69 to at least 74, to have functional motion for tasks like reach and grasp.  Goal status: INITIAL  4.  Pt will improve strength in TBD (pending baseline measures in the next 1-2 sessions) to have increased functional ability to carry out selfcare and higher-level homecare tasks with less difficulty. Goal status: INITIAL  5.  Pt will decrease pain at worst from 6/10 to 2/10 or better to have better sleep and occupational participation in daily roles. Goal status: INITIAL   ASSESSMENT:  CLINICAL IMPRESSION: Patient is a 67y.o. male who was seen today for occupational therapy evaluation for bilateral wrist and thumb CMC joint pain.  Right thumb also has history of MCP joint injuries and has some ulnar drift with subluxing EPL.  He does have some orthoses in prefabricated braces, but has not been doing a home exercise program.  He will benefit from outpatient occupational therapy to decrease symptoms and increase quality of life.   PERFORMANCE DEFICITS: in functional skills including ADLs, IADLs, coordination, dexterity, ROM, strength, pain, Fine motor control,  body mechanics, endurance, decreased knowledge of precautions, and UE functional use, cognitive skills including problem solving and safety awareness, and psychosocial skills including coping strategies, environmental adaptation, habits, and routines and behaviors.   IMPAIRMENTS: are limiting patient from ADLs, IADLs, and leisure.   COMORBIDITIES: may have  co-morbidities  that affects occupational performance. Patient will benefit from skilled OT to address above impairments and improve overall function.  MODIFICATION OR ASSISTANCE TO COMPLETE EVALUATION: No modification of tasks or assist necessary to complete an evaluation.  OT OCCUPATIONAL PROFILE AND HISTORY: Problem focused assessment: Including review of records relating to presenting problem.  CLINICAL DECISION MAKING: LOW - limited treatment options, no task modification necessary  REHAB POTENTIAL: Excellent  EVALUATION COMPLEXITY: Low      PLAN:  OT FREQUENCY: 1-2x/week  OT DURATION: 4 weeks through 01/13/24 and up to 6 visits as needed   PLANNED INTERVENTIONS: 97535 self care/ADL training, 16109 therapeutic exercise, 97530 therapeutic activity, 97112 neuromuscular re-education, 97140 manual therapy, 97760 Orthotic Initial, 97763 Orthotic/Prosthetic subsequent, compression bandaging, Dry needling, energy conservation, coping strategies training, patient/family education, and DME and/or AE instructions  RECOMMENDED OTHER SERVICES: in PT for lower body issues   CONSULTED AND AGREED WITH PLAN OF CARE: Patient  PLAN FOR NEXT SESSION:   Review initial HEP and recommendations.  Add stretch for MCP joints of right hand digits 2 and 3 as needed.  Also for intrinsic tightness   Leartis Proud, OTR/L, CHT 12/12/2023, 10:57 AM

## 2023-12-15 ENCOUNTER — Encounter (HOSPITAL_BASED_OUTPATIENT_CLINIC_OR_DEPARTMENT_OTHER): Payer: Self-pay | Admitting: Pulmonary Disease

## 2023-12-15 ENCOUNTER — Ambulatory Visit (HOSPITAL_BASED_OUTPATIENT_CLINIC_OR_DEPARTMENT_OTHER)

## 2023-12-15 ENCOUNTER — Encounter (HOSPITAL_BASED_OUTPATIENT_CLINIC_OR_DEPARTMENT_OTHER): Payer: Self-pay

## 2023-12-15 ENCOUNTER — Ambulatory Visit (INDEPENDENT_AMBULATORY_CARE_PROVIDER_SITE_OTHER): Admitting: Pulmonary Disease

## 2023-12-15 VITALS — BP 129/88 | HR 77 | Ht 72.5 in | Wt 221.0 lb

## 2023-12-15 DIAGNOSIS — J45909 Unspecified asthma, uncomplicated: Secondary | ICD-10-CM | POA: Diagnosis not present

## 2023-12-15 DIAGNOSIS — R5383 Other fatigue: Secondary | ICD-10-CM

## 2023-12-15 DIAGNOSIS — M25542 Pain in joints of left hand: Secondary | ICD-10-CM

## 2023-12-15 DIAGNOSIS — M545 Low back pain, unspecified: Secondary | ICD-10-CM | POA: Diagnosis not present

## 2023-12-15 DIAGNOSIS — M6281 Muscle weakness (generalized): Secondary | ICD-10-CM

## 2023-12-15 DIAGNOSIS — Z9981 Dependence on supplemental oxygen: Secondary | ICD-10-CM

## 2023-12-15 DIAGNOSIS — G4733 Obstructive sleep apnea (adult) (pediatric): Secondary | ICD-10-CM | POA: Diagnosis not present

## 2023-12-15 DIAGNOSIS — J4530 Mild persistent asthma, uncomplicated: Secondary | ICD-10-CM

## 2023-12-15 DIAGNOSIS — I2699 Other pulmonary embolism without acute cor pulmonale: Secondary | ICD-10-CM

## 2023-12-15 DIAGNOSIS — M25541 Pain in joints of right hand: Secondary | ICD-10-CM

## 2023-12-15 NOTE — Progress Notes (Signed)
 Subjective:    Patient ID: Casey Gaster, MD, male    DOB: 1956-03-03, 68 y.o.   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 He is concerned about a decline in pulmonary capacity, as he tires more quickly during exercise, though playing the bassoon is unaffected. He stopped Breztri  due to unclear benefits and uses albuterol  as needed.  He feels constantly tired, initially attributing it to shortness of breath. Oxygen saturation levels remain high at 98%. Stopping Singulair  and Zoloft  has not alleviated fatigue. A cardiopulmonary stress test indicated exercise limitation due to pulmonary issues. Lung function shows variability, with FEV1 at 60% in 2021 and 67% in 2023.  He has obstructive sleep apnea with an AHI of 23, categorized as moderate. He uses CPAP therapy, which his wife believes reduces snoring and improves sleep quality, though he feels he sleeps well with or without it. He has a dental appliance but does not use it regularly.  Significant tests/ events reviewed 05/2018 ventilation perfusion scan: Low probability   10/2018: HRCT chest No evidence of interstitial lung disease resolution of right lung base opacity seen previously, left upper lobe lung nodule   03/14/2019: CTA chest Bilateral lower lobe acute pulmonary emboli with evidence of right ventricular strain RV LV ratio 1.2. Left upper lobe 5 mm sub-solid pulmonary nodule with groundglass    10/2019 CT chest wo contrast- Decreased size of left upper lobe nodule with a mean diameter of 4  mm, previously 6 mm and favored to reflect an infectious/inflammatory process.      04/2022 spirometry-FEV1 67%, ratio 71, FVC 70% - 08/2018 PFT:  FEV1 56%, ratio 68. No bronchodilator response. - Alpha 1 phenotype is MM   CPET ([facility] ) 10/2019 >> MVV 100%, pulmonary limitation to exercise, ratio 61, FEV1 59%, FVC 74%   HST 04/2020 >> AHI 23/h , 103 minutes with saturation less than 89%  Review of Systems neg for any significant sore throat, dysphagia, itching, sneezing, nasal congestion or excess/ purulent secretions, fever, chills, sweats, unintended wt loss, pleuritic or exertional cp, hempoptysis, orthopnea pnd or change in chronic leg swelling. Also denies presyncope, palpitations, heartburn, abdominal pain, nausea, vomiting, diarrhea or change in bowel or urinary habits, dysuria,hematuria, rash, arthralgias, visual complaints, headache, numbness weakness or ataxia.     Objective:   Physical Exam  Gen. Pleasant, well-nourished, in no distress ENT - no thrush, no pallor/icterus,no post nasal drip Neck: No JVD, no thyromegaly, no carotid bruits Lungs: no use of accessory muscles, no dullness to percussion, clear without rales or rhonchi  Cardiovascular: Rhythm regular, heart sounds  normal, no murmurs or gallops, no peripheral edema Musculoskeletal: No deformities, no cyanosis or clubbing        Assessment & Plan:    Asthma Asthma with variability in lung function, previously managed with Breztri  and albuterol . Reports decreased exercise tolerance and thoracic tightness during exertion. Prior FEV1 ranged from 60% to 67%. - Trial Breztri  for one month, two puffs twice daily, to evaluate symptom improvement. - Arrange objective lung function measurement post-Breztri  trial.  Obstructive Sleep Apnea (OSA) Moderate OSA with AHI of 23. CPAP improves snoring and sleep quality per spouse, though subjective sleep quality remains unchanged. Inconsistent use of dental appliance. - Continue  CPAP as tolerated, particularly if spouse observes improvement in snoring and sleep quality.  Pulmonary Embolism Pulmonary embolism with genetic hypercoagulable state; standard hypercoagulable tests negative. Lifelong anticoagulation in place. Prior cardiopulmonary stress test indicated pulmonary limitation to exercise.  Fatigue Chronic fatigue with no clear etiology. Normal thyroid and B12 levels. Unremarkable neurological evaluation by Dr. Tresia Fruit. Fatigue persists post-discontinuation of Singulair  and Zoloft . Open to further testing if necessary. - Consider further evaluation if new symptoms develop or fatigue worsens.

## 2023-12-15 NOTE — Therapy (Signed)
 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 DOB:March 13, 1956, 68 y.o., male Today's Date: 12/15/2023  END OF SESSION:  PT End of Session - 12/15/23 0852     Visit Number 2    Number of Visits 11    Date for PT Re-Evaluation 03/07/24    Authorization Type MCR A and B    PT Start Time 0849    PT Stop Time 0933    PT Time Calculation (min) 44 min    Activity Tolerance Patient tolerated treatment well    Behavior During Therapy WFL for tasks assessed/performed               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HCC)    ongoing evaluation for elevated R>L adosterone secretion   Arthritis    Atrial enlargement, bilateral    Atrial fibrillation (HCC)    paroxysmal   Atrial flutter (HCC)    Complication of anesthesia    could not urinate after UHR, needed cath, mental fog sinc cervical fusion 08-09-16    Dysrhythmia    a-fib-had ablation x2   GERD (gastroesophageal reflux disease)    Hyperaldosteronism (HCC)    Hypertension    Kidney cysts    "multiple"   Nephrolithiasis    Pneumonia 1990's   Pre-diabetes    Prostatitis    Renal insufficiency    "Cr 1.6"   Past Surgical History:  Procedure Laterality Date   ABLATION     ATRIAL FIBRILLATION ABLATION N/A 09/28/2012   Procedure: ATRIAL FIBRILLATION ABLATION;  Surgeon: Jolly Needle, MD;  Location: [facility];  Service: Cardiovascular;  Laterality: N/A;   atrial fibrillation ablation with CTI ablation  05/27/11, 09/28/12   ablation for afib and atrial flutter by Dr Pennye Boy SURGERY  08/09/2016   C 5 6  C 6 to C 7 cervical fusion   CARDIOVERSION  07/28/2011   Procedure: CARDIOVERSION;  Surgeon: Lenise Quince, MD;  Location: [facility] OR;  Service: Cardiovascular;  Laterality: N/A;   COLONOSCOPY WITH PROPOFOL  N/A 03/03/2015   Procedure: COLONOSCOPY WITH PROPOFOL ;  Surgeon: Garrett Kallman, MD;  Location: [facility];  Service:  Gastroenterology;  Laterality: N/A;   double j stent placement  06/2004   left   ESOPHAGOGASTRODUODENOSCOPY (EGD) WITH PROPOFOL  N/A 03/03/2015   Procedure: ESOPHAGOGASTRODUODENOSCOPY (EGD) WITH PROPOFOL ;  Surgeon: Garrett Kallman, MD;  Location: [facility];  Service: Gastroenterology;  Laterality: N/A;   ESOPHAGOGASTRODUODENOSCOPY (EGD) WITH PROPOFOL  N/A 10/25/2016   Procedure: ESOPHAGOGASTRODUODENOSCOPY (EGD) WITH PROPOFOL ;  Surgeon: Garrett Kallman, MD;  Location: [facility];  Service: Endoscopy;  Laterality: N/A;   INGUINAL HERNIA REPAIR     "as a child; ? right"   INGUINAL HERNIA REPAIR Left 06/23/2016   Procedure: OPEN LEFT INGUINAL HERNIA  REPAIR WITH MESH;  Surgeon: Jacolyn Matar, MD;  Location: [facility];  Service: General;  Laterality: Left;   INSERTION OF MESH Left 06/23/2016   Procedure: INSERTION OF MESH;  Surgeon: Jacolyn Matar, MD;  Location: [facility];  Service: General;  Laterality: Left;   LEFT HEART CATH AND CORONARY ANGIOGRAPHY N/A 07/25/2018   Procedure: LEFT HEART CATH AND CORONARY ANGIOGRAPHY;  Surgeon: Arty Binning, MD;  Location: [facility]VAS[facility];  Service: Cardiovascular;  Laterality: N/A;   LUNG BIOPSY  1993   NASAL SEPTOPLASTY W/ TURBINOPLASTY  07/2006   prostate biopsy  2011   schwannoma  removal  ~01/2010   left thorax   TEE WITHOUT CARDIOVERSION N/A 09/27/2012   Procedure: TRANSESOPHAGEAL ECHOCARDIOGRAM (TEE);  Surgeon: Darlis Eisenmenger, MD;  Location: [facility] ENDOSCOPY;  Service: Cardiovascular;  Laterality: N/A;   ULTRASOUND GUIDANCE FOR VASCULAR ACCESS  07/25/2018   Procedure: Ultrasound Guidance For Vascular Access;  Surgeon: Arty Binning, MD;  Location: [facility] INVASIVE CV LAB;  Service: Cardiovascular;;   UMBILICAL HERNIA REPAIR  07/2006   Patient Active Problem List   Diagnosis Date Noted   Spinal stenosis of lumbar region with neurogenic claudication 11/14/2023   Hoarseness 10/04/2023   Primary osteoarthritis both  carpometacarpal joints and right second metacarpophalangeal joint 09/27/2023   Hypercoagulable state due to paroxysmal atrial fibrillation (HCC) 11/10/2022   Asthma, intermittent 11/01/2022   Status post implantation of urinary electronic stimulator device 10/12/2022   Trochanteric bursitis, left hip 02/22/2022   Bilateral impacted cerumen 10/23/2020   Laryngopharyngeal reflux (LPR) 10/23/2020   Vocal cord nodule 10/23/2020   Bilateral elbow joint pain 06/25/2020   Lateral epicondylitis of left elbow 06/25/2020   Moderate COPD (chronic obstructive pulmonary disease) (HCC) 03/26/2020   OSA (obstructive sleep apnea) 03/26/2020   Pulmonary emboli (HCC) 03/14/2019   Chronic respiratory failure with hypoxia (HCC) 08/18/2018   Hyperlipidemia 08/17/2018   CKD (chronic kidney disease), stage II 08/16/2018   Abnormal ECG 07/25/2018   Incontinence of feces 04/18/2018   Urge incontinence of urine 03/11/2018   Renal insufficiency    Prostatitis    Pre-diabetes    Nephrolithiasis    Kidney cysts    Hyperaldosteronism (HCC)    GERD (gastroesophageal reflux disease)    Dysrhythmia    Complication of anesthesia    Atrial enlargement, bilateral    Arthritis    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HCC)    Inguinal hernia of left side without obstruction or gangrene Nov 2017 06/23/2016   Adjustment disorder with depressed mood 05/11/2016   Atrial fibrillation (HCC) 03/18/2011   Atrial flutter (HCC) 03/18/2011   Hypertension 03/18/2011     REFERRING PROVIDER: Gean Keels, MD  REFERRING DIAG:  Diagnosis  M19.041 (ICD-10-CM) - Primary osteoarthritis, right hand  M48.062 (ICD-10-CM) - Spinal stenosis of lumbar region with neurogenic claudication    THERAPY DIAG:  Pain in joint of right hand  Pain in joint of left hand  Muscle weakness (generalized)  Rationale for Evaluation and Treatment: Rehabilitation  ONSET DATE: 2023+   SUBJECTIVE:   SUBJECTIVE STATEMENT:  Started  seeing OT for bil hands and felt it was helpful. Waiting to schedule more appt.     Pt reports he has bilat hand pain as well as history of degenerative changes at the back. Pt reports MCP joint pain into both hands. Recently had injections into bilat CMC joints.  Pt does have hand weakness into both sides with R slightly worse than L. Pt works with a Systems analyst 2x/wk.  Pt performs squats with trainer and is very sore the 2nd and 3rd day. Pt does have peripheral neuropathy.   Pt does have some pain and limitation with extended walking.  He typically walks 5-6 miles per week with his wife.  Pt walks and does have some limitation due to fatigue. Pt denies radiating pain, distal thigh NT, or red flag symptoms.  Pt able to play pickleball without limitation.   PERTINENT HISTORY: -A-fib with a hx of ablation x 2, Hx of PE which may have led to chronic pulmonary issues -incontinence--Pt has a sacral nerve stimulator  -Paralyzed  left diaphragm per pt, arthritis, pre-diabetic, cervical fusion, and hernia repairs -Pt uses bilat hearing aides  PAIN:  NPRS:  4/10 Worst, 2/10 Current, 1/10 Best Location:  bilat hand, buttocks/LBP  Agg factors: playing piano and basoon; holding weights; carry/lifting, putting on support hose  Relieve: sitting, resting, changing positions, stretching    PRECAUTIONS: Other: sacral nerve stimulator--No E-stim; cervical fusion.  WEIGHT BEARING RESTRICTIONS: No  FALLS:  Has patient fallen in last 6 months? No  LIVING ENVIRONMENT: Lives with: lives with their spouse Lives in: 2 story home Stairs: yes.   Has following equipment at home: none  OCCUPATION: retired ENT.  PLOF: Independent;    PATIENT GOALS:  be able to play tennis and pickleball; continue with exercise; return to playing musical instruments    OBJECTIVE:   DOBJECTIVE:  Note: Objective measures were completed at Evaluation unless otherwise noted.   UEFI: Upper Extremity Function  Scale: 40 / 80 = 50.0 % ODI: Modified Oswestry Low Back Pain Disability Questionnaire: 15 / 50 = 30.0 %  DIAGNOSTIC FINDINGS:  LUMBAR XR IMPRESSION: 1. Moderate L1-2 and L3-4 degenerative disc and endplate changes, similar to prior. 2. Moderate L5-S1 facet joint degenerative changes. 3. Mild chronic anterior superior L1 vertebral body height loss.     Electronically Signed   By: Bertina Broccoli M.D.   On: 11/17/2023 08:38   IR HAND XR IMPRESSION: 1. Moderate triscaphe and moderate to severe thumb carpometacarpal osteoarthritis. 2. Severe second and moderate third metacarpophalangeal osteoarthritis.     Electronically Signed   By: Bertina Broccoli M.D.   On: 11/17/2023 08:32   L HAND XR IMPRESSION: 1. Severe triscaphe and thumb carpometacarpal osteoarthritis. 2. Moderate to severe index finger osteoarthritis.     Electronically Signed   By: Bertina Broccoli M.D.   On: 11/17/2023 08:31 OUTCOME MEASURE: ODI    SCREENING FOR RED FLAGS: Bowel or bladder incontinence: No Spinal tumors: No Cauda equina syndrome: No  Ongoing and long term fecal and urinary urgency     COGNITION: Overall cognitive status: Within functional limits for tasks assessed                          SENSATION: Light touch: Impaired at shin   MUSCLE LENGTH: Hamstrings: limited supine 90/90    POSTURE: rounded shoulders, forward head, increased thoracic kyphosis, flexed trunk , and weight shift right   PALPATION: TTP of bilat lumbar paraspinals and QL and joint stiffness extension and rotation bilat L1-5; decreased muscle mass of glutes bilat; TTP of bilat glutes and hip rotators    LUMBAR ROM:    AROM eval  Flexion 75%   Extension 0%   Right lateral flexion 50%  Left lateral flexion 50%   Right rotation 75%  Left rotation 75%   (Blank rows = not tested)  Finger ROM: limited finger extension and flexion with tightness at end range; difficulty with finger opposition;   Consider grip  strength test at next- deferred due to time today   LOWER EXTREMITY ROM:   moderately limited in all planes at hip due to hip stiffness   LOWER EXTREMITY MMT:  deferred    LUMBAR SPECIAL TESTS:  Straight leg raise test: negative, Slump test: negative,  FABER test: negative    GAIT: Distance walked: 57ft Assistive device utilized: None Level of assistance: Complete Independence Comments: L T/S SB, toe out, decreased lumbar and hip rotation bilat, decreased step length, lack of hip extension  bilat   TODAY'S TREATMENT:                                                                                                                              DATE:    5/22 Reviewed QL stretch HS stretching supine ITB stretching supine Piriformis stretching supine Adductor stretching supine Squats x12 Standing adductor lunge stretch HEP update and review   5/15  Exercises - Bridge with Hip Abduction and Resistance  - 1-2 x daily - 7 x weekly - 2 sets - 10 reps - Seated Quadratus Lumborum Stretch in Chair  - 2 x daily - 7 x weekly - 1 sets - 3 reps - 30 hold - Jefferson Curl  - 2 x daily - 7 x weekly - 1 sets - 5 reps - Putty Squeezes  - 2 x daily - 7 x weekly - 1 sets - 20 reps - Resisted Finger Extension and Thumb Abduction  - 2 x daily - 7 x weekly - 1 sets - 10 reps - Seated Finger Composite Flexion Extension  - 2 x daily - 7 x weekly - 2 sets - 10 reps   PATIENT EDUCATION:  Education details: MOI, diagnosis, prognosis, anatomy, exercise progression, DOMS expectations, muscle firing,  envelope of function, HEP, POC   Person educated: Patient Education method: Explanation, Demonstration, Tactile cues, Verbal cues, and Handouts Education comprehension: verbalized understanding, returned demonstration, verbal cues required, tactile cues required, and needs further education   HOME EXERCISE PROGRAM:      Access Code: [email or web address] URL: [email or web address] Date:  12/08/2023 Prepared by: Silver Dross   ASSESSMENT:   CLINICAL IMPRESSION:  Worked on stretching and strengthening for bil hips today. Initiated cook bridging for isolated glute strengthening without complaint. Tighter in L adductors compared to R. Requires cues with s/l abduction for proper positioning and performance. Provided pt with updated HEP to work on at home. Pt seeing OT currently to address bil hand complaints. Focused on lumbar complaints in PT today. Pt also working out with trainer at home 2x/week.   Eval: Patient is a 68 y.o. male who was seen today for physical therapy evaluation and treatment for c/c of LBP and hand/finger pain. Pt's s/s appear consistent with mechanical LBP due to joint and muscle stiffness- mild radicular pain. Hand pain consistent with history of hand OA. Pt's is more stiffness dominant at this time. Given nature and progression of hand deficits, pt would like benefit from subsequent OT/hand therapist visits for specialized fine motor and hand strengthening as pt does have complaints with activities such as buttons, door knobs, and jars. Pt would benefit from continued skilled therapy in order to reach goals and maximize functional lumbar strength and ROM for prevention of further functional decline.   OBJECTIVE IMPAIRMENTS: Abnormal gait, decreased activity tolerance, decreased endurance, decreased mobility, difficulty walking, decreased ROM, decreased strength, hypomobility, impaired flexibility, impaired sensation, improper body mechanics, postural dysfunction, and pain.    ACTIVITY  LIMITATIONS: carrying, lifting, bending, sitting, standing, squatting, stairs, transfers, and locomotion level   PARTICIPATION LIMITATIONS: cleaning, laundry, interpersonal relationship, driving, shopping, community activity, yard work, and exercise   PERSONAL FACTORS: Age, Time since onset of injury/illness/exacerbation, and 1 comorbiditie(s):    are also affecting patient's  functional outcome.    REHAB POTENTIAL: fair   CLINICAL DECISION MAKING: unstable/complicated   EVALUATION COMPLEXITY: moderate     GOALS:     SHORT TERM GOALS: Target date: 01/19/2024     Pt will become independent with HEP in order to demonstrate synthesis of PT education.    Goal status: INITIAL   2.  Pt will report at least 2 pt reduction on NPRS scale for pain in order to demonstrate functional improvement with household activity, self care, and ADL.    Goal status: INITIAL   3.  Pt will have an at least 9 pt improvement in UEFI measure in order to demonstrate MCID improvement in daily function.   Goal status: INITIAL     LONG TERM GOALS: Target date: 03/01/2024    Pt  will become independent with final HEP in order to demonstrate synthesis of PT education.   Goal status: INITIAL   2.  Pt will demonstrate at least a 12.8 improvement in Oswestry Index in order to demonstrate a clinically significant change in LBP and function.    Goal status: INITIAL   3.  Pt will be able to demonstrate/report ability to walk >30 mins without pain in order to demonstrate functional improvement and tolerance to exercise and community mobility.    Goal status: INITIAL   4.  Pt will be able to demonstrate ability to perform 90 lbs and 88lbs of grip strength in R and L respectively in order to demonstrate functional improvement in UE function for self-care and house hold duties.    Goal status: INITIAL   5. Pt will be able to lift/squat/hold >25 lbs in order to demonstrate functional improvement in lumbopelvic strength for exercise, travel, and ADL.    Goal status: INITIAL   PLAN:   PT FREQUENCY: 1-2x/week   PT DURATION: 12 weeks (likely D/C by 8 wks)   PLANNED INTERVENTIONS: Therapeutic exercises, Therapeutic activity, Neuromuscular re-education, Balance training, Gait training, Patient/Family education, Self Care, Joint mobilization, Joint manipulation, Stair training,  Orthotic/Fit training, DME instructions, Aquatic Therapy, Dry Needling, Electrical stimulation, Spinal manipulation, Spinal mobilization, Cryotherapy, Moist heat, scar mobilization, Splintting, Taping, Vasopneumatic device, Traction, Ultrasound, Ionotophoresis 4mg /ml Dexamethasone , Manual therapy, and Re-evaluation.   PLAN FOR NEXT SESSION:  lumbar mobility, lumbopelvic control, abdominal bracing technique, grip strength, finger and thumb mobility    Fronie Jewett Kineta Fudala, PTA 12/15/2023, 10:17 AM  12/15/23 10:17 AM

## 2023-12-15 NOTE — Patient Instructions (Signed)
 X spirometry pre/post  Trial of Breztri  2 puffs twice daily  X CPAP supplies as needed

## 2023-12-15 NOTE — Therapy (Deleted)
 OUTPATIENT OCCUPATIONAL THERAPY ORTHO EVALUATION  Patient Name: Casey FARE, MD MRN: [id number] DOB:1956-07-23, 68 y.o., male Today's Date: 12/15/2023  PCP: Ardella Konig MD REFERRING PROVIDER: Gean Keels, MD   END OF SESSION:    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HCC)    ongoing evaluation for elevated R>L adosterone secretion   Arthritis    Atrial enlargement, bilateral    Atrial fibrillation (HCC)    paroxysmal   Atrial flutter (HCC)    Complication of anesthesia    could not urinate after UHR, needed cath, mental fog sinc cervical fusion 08-09-16    Dysrhythmia    a-fib-had ablation x2   GERD (gastroesophageal reflux disease)    Hyperaldosteronism (HCC)    Hypertension    Kidney cysts    "multiple"   Nephrolithiasis    Pneumonia 1990's   Pre-diabetes    Prostatitis    Renal insufficiency    "Cr 1.6"   Past Surgical History:  Procedure Laterality Date   ABLATION     ATRIAL FIBRILLATION ABLATION N/A 09/28/2012   Procedure: ATRIAL FIBRILLATION ABLATION;  Surgeon: Jolly Needle, MD;  Location: [facility] CATH [facility];  Service: Cardiovascular;  Laterality: N/A;   atrial fibrillation ablation with CTI ablation  05/27/11, 09/28/12   ablation for afib and atrial flutter by Dr Pennye Boy SURGERY  08/09/2016   C 5 6  C 6 to C 7 cervical fusion   CARDIOVERSION  07/28/2011   Procedure: CARDIOVERSION;  Surgeon: Lenise Quince, MD;  Location: [facility] OR;  Service: Cardiovascular;  Laterality: N/A;   COLONOSCOPY WITH PROPOFOL  N/A 03/03/2015   Procedure: COLONOSCOPY WITH PROPOFOL ;  Surgeon: Garrett Kallman, MD;  Location: [facility] ENDOSCOPY;  Service: Gastroenterology;  Laterality: N/A;   double j stent placement  06/2004   left   ESOPHAGOGASTRODUODENOSCOPY (EGD) WITH PROPOFOL  N/A 03/03/2015   Procedure: ESOPHAGOGASTRODUODENOSCOPY (EGD) WITH PROPOFOL ;  Surgeon: Garrett Kallman, MD;  Location: [facility] ENDOSCOPY;  Service: Gastroenterology;  Laterality:  N/A;   ESOPHAGOGASTRODUODENOSCOPY (EGD) WITH PROPOFOL  N/A 10/25/2016   Procedure: ESOPHAGOGASTRODUODENOSCOPY (EGD) WITH PROPOFOL ;  Surgeon: Garrett Kallman, MD;  Location: [facility]OS[facility];  Service: Endoscopy;  Laterality: N/A;   INGUINAL HERNIA REPAIR     "as a child; ? right"   INGUINAL HERNIA REPAIR Left 06/23/2016   Procedure: OPEN LEFT INGUINAL HERNIA  REPAIR WITH MESH;  Surgeon: Jacolyn Matar, MD;  Location: [facility];  Service: General;  Laterality: Left;   INSERTION OF MESH Left 06/23/2016   Procedure: INSERTION OF MESH;  Surgeon: Jacolyn Matar, MD;  Location: [facility];  Service: General;  Laterality: Left;   LEFT HEART CATH AND CORONARY ANGIOGRAPHY N/A 07/25/2018   Procedure: LEFT HEART CATH AND CORONARY ANGIOGRAPHY;  Surgeon: Arty Binning, MD;  Location: [facility] INVASIVE CV LAB;  Service: Cardiovascular;  Laterality: N/A;   LUNG BIOPSY  1993   NASAL SEPTOPLASTY W/ TURBINOPLASTY  07/2006   prostate biopsy  2011   schwannoma removal  ~01/2010   left thorax   TEE WITHOUT CARDIOVERSION N/A 09/27/2012   Procedure: TRANSESOPHAGEAL ECHOCARDIOGRAM (TEE);  Surgeon: Darlis Eisenmenger, MD;  Location: [facility] ENDOSCOPY;  Service: Cardiovascular;  Laterality: N/A;   ULTRASOUND GUIDANCE FOR VASCULAR ACCESS  07/25/2018   Procedure: Ultrasound Guidance For Vascular Access;  Surgeon: Arty Binning, MD;  Location: [facility] INVASIVE CV LAB;  Service: Cardiovascular;;   UMBILICAL HERNIA REPAIR  07/2006   Patient Active Problem List  Diagnosis Date Noted   Spinal stenosis of lumbar region with neurogenic claudication 11/14/2023   Hoarseness 10/04/2023   Primary osteoarthritis both carpometacarpal joints and right second metacarpophalangeal joint 09/27/2023   Hypercoagulable state due to paroxysmal atrial fibrillation (HCC) 11/10/2022   Asthma, intermittent 11/01/2022   Status post implantation of urinary electronic stimulator device 10/12/2022   Trochanteric bursitis, left hip  02/22/2022   Bilateral impacted cerumen 10/23/2020   Laryngopharyngeal reflux (LPR) 10/23/2020   Vocal cord nodule 10/23/2020   Bilateral elbow joint pain 06/25/2020   Lateral epicondylitis of left elbow 06/25/2020   Moderate COPD (chronic obstructive pulmonary disease) (HCC) 03/26/2020   OSA (obstructive sleep apnea) 03/26/2020   Pulmonary emboli (HCC) 03/14/2019   Chronic respiratory failure with hypoxia (HCC) 08/18/2018   Hyperlipidemia 08/17/2018   CKD (chronic kidney disease), stage II 08/16/2018   Abnormal ECG 07/25/2018   Incontinence of feces 04/18/2018   Urge incontinence of urine 03/11/2018   Renal insufficiency    Prostatitis    Pre-diabetes    Nephrolithiasis    Kidney cysts    Hyperaldosteronism (HCC)    GERD (gastroesophageal reflux disease)    Dysrhythmia    Complication of anesthesia    Atrial enlargement, bilateral    Arthritis    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HCC)    Inguinal hernia of left side without obstruction or gangrene Nov 2017 06/23/2016   Adjustment disorder with depressed mood 05/11/2016   Atrial fibrillation (HCC) 03/18/2011   Atrial flutter (HCC) 03/18/2011   Hypertension 03/18/2011    ONSET DATE: chronic   REFERRING DIAG: M19.041 (ICD-10-CM) - Primary osteoarthritis, right hand   THERAPY DIAG:  No diagnosis found.  Rationale for Evaluation and Treatment: Rehabilitation  SUBJECTIVE:   SUBJECTIVE STATEMENT: He shows up bit late for eval, states he plays the piano and the bassoon, is very active playing pickle ball, etc.  He has been having pain and problems doing small buttons, gripping objects, using his hand for instruments, etc.  He did bring several braces today including old orthoses and prefabricated braces.  He has questions about pain management.  He states he has no current stretch program for his hands.  He states his wrists also are stiff and hurt.Casey [name] He is retired [employer] social research officer.     PERTINENT HISTORY: Pre PT note: "Pt reports  he has bilat hand pain as well as history of degenerative changes at the back. Pt reports MCP joint pain into both hands. Recently had injections into bilat CMC joints.  Pt does have hand weakness into both sides with R slightly worse than L. Pt works with a Systems analyst 2x/wk.  "  Past cervical fusion   PRECAUTIONS: None  RED FLAGS: None   WEIGHT BEARING RESTRICTIONS: No  PAIN:  Are you having pain? Yes: NPRS scale: 1/10 at rest, up to 6/10 at worst in the past week, intermittent  Pain location: Bilateral wrists and thumbs Pain description: Aching and sore Aggravating factors: Strong gripping and squeezing Relieving factors: Rest  FALLS: Has patient fallen in last 6 months? Yes. Number of falls 1 during pickleball last week- not a fall risk   LIVING ENVIRONMENT: Lives with: lives with their family Lives in: House/apartment Has following equipment at home: Multiple hand and thumb braces  PLOF: Independent  PATIENT GOALS: To get relief from bilateral hand and thumb and wrist pain  NEXT MD VISIT: As needed   OBJECTIVE: (All objective assessments below are from initial evaluation on: 12/12/23 unless otherwise specified.)  HAND DOMINANCE: Right   ADLs: Overall ADLs: States decreased ability to grab, hold household objects, pain and difficulty to open containers, perform FMS tasks (manipulate fasteners on clothing)   FUNCTIONAL OUTCOME MEASURES: Eval: Patient Specific Functional Scale: 6.8 (playing piano, playing bassoon, shaving, opening jars, putting on TED hose)  (Higher Score  =  Better Ability for the Selected Tasks)      UPPER EXTREMITY ROM     Shoulder to Wrist AROM Right eval Left eval  Forearm supination    Forearm pronation     Wrist flexion 75 71  Wrist extension 69 71  Wrist ulnar deviation    Wrist radial deviation    (Blank rows = not tested)   Hand AROM Right eval Left eval  Full Fist Ability (or Gap to Distal Palmar Crease) Full Full  Thumb  Opposition  (Kapandji Scale)  9/10 9/10  Thumb MCP (0-60) 47 43  Thumb IP (0-80) 58 42  Thumb Radial Abduction Span 48 44   Thumb Palmar Abduction Span     Index MCP (0-90)     Index PIP (0-100)     Index DIP (0-70)      Long MCP (0-90)      Long PIP (0-100)      Long DIP (0-70)      Ring MCP (0-90)      Ring PIP (0-100)      Ring DIP (0-70)      Little MCP (0-90)      Little PIP (0-100)      Little DIP (0-70)      (Blank rows = not tested)   UPPER EXTREMITY MMT:    Eval:  NT at eval, will be tested when appropriate.   MMT Right TBD Left TBD  Shoulder flexion    Shoulder abduction    Shoulder adduction    Shoulder extension    Shoulder internal rotation    Shoulder external rotation    Middle trapezius    Lower trapezius    Elbow flexion    Elbow extension    Forearm supination    Forearm pronation    Wrist flexion    Wrist extension    Wrist ulnar deviation    Wrist radial deviation    (Blank rows = not tested)  HAND FUNCTION: Eval: Observed weakness in affected Lt hand.  Grip strength Right: 70 lbs, Left: 56 lbs   COORDINATION: Eval: Observed coordination impairments with affected Lt hand. 9 Hole Peg Test Right: 24sec, Left: 27 sec (approximately 23 or 24 sec is WFL)   SENSATION: Eval:  Light touch intact today no paresthesia complaints  EDEMA:   Eval:  Mildly swollen in MCP joint of right thumb, index, middle fingers, also mildly in bilateral wrists today  COGNITION: Eval: Overall cognitive status: WFL for evaluation today   OBSERVATIONS:   Eval: Right thumb MCP joint has EPL subluxation and ulnar drift apparently old injuries.  CMC joints are both collapsing somewhat and wrists are both grinding with some crepitus felt in left wrist is stiffer than right wrist.  He also has positive test of intrinsic tightness   TODAY'S TREATMENT:  Post-evaluation treatment:   For safety/self-care he was given information on managing hand arthritis as listed  under patient instructions.  These were reviewed with him somewhat today.  Next, he was given the following home exercise program to perform 2 or 3 times a day, ideally after using moist heat for 2 or 3 minutes.  They were performed with him quickly today to ensure understanding and nonpainful stretches were felt.  He leaves stating understanding that these will likely need reviewed in upcoming sessions.   Exercises - Seated Wrist Flexion Stretch  - 3 x daily - 3 reps - 15 hold - Wrist Prayer Stretch  - 3 x daily - 3 reps - 15 sec hold - Stretch thumb toward base of small finger (put hand in LAP)  - 2-3 x daily - 3-5 reps - 15 sec hold - Thumb PROM Composite Extension  - 2-3 x daily - 3 reps - 15 sec hold - Thumb Webspace Stretch  - 3 x daily - 3 reps - 15 sec hold - Towel Roll Grip with Forearm in Neutral  - 3 x daily - 5 reps - 10 sec hold  PATIENT EDUCATION: Education details: See tx section above for details  Person educated: Patient Education method: Verbal Instruction, Teach back, Handouts  Education comprehension: States and demonstrates understanding, Additional Education required    HOME EXERCISE PROGRAM: Access Code: [email or web address] URL: [email or web address] Date: 12/12/2023 Prepared by: Leartis Proud     GOALS: Goals reviewed with patient? Yes   SHORT TERM GOALS: (STG required if POC>30 days) Target Date: 12/23/2023  Pt will demo/state understanding of initial HEP to improve pain levels and prerequisite motion. Goal status: INITIAL   LONG TERM GOALS: Target Date: 01/13/2024  Pt will improve functional ability by decreased impairment per PSFS assessment from 6.8 to 8 or better, for better quality of life. Goal status: INITIAL  2.  Pt will improve grip strength in left hand from 56 lbs to at least 60 to lbs for functional use at home and in IADLs. Goal status: INITIAL  3.  Pt will improve A/ROM in right wrist extension from 69 to at least 74, to have  functional motion for tasks like reach and grasp.  Goal status: INITIAL  4.  Pt will improve strength in TBD (pending baseline measures in the next 1-2 sessions) to have increased functional ability to carry out selfcare and higher-level homecare tasks with less difficulty. Goal status: INITIAL  5.  Pt will decrease pain at worst from 6/10 to 2/10 or better to have better sleep and occupational participation in daily roles. Goal status: INITIAL   ASSESSMENT:  CLINICAL IMPRESSION: Patient is a 67y.o. male who was seen today for occupational therapy evaluation for bilateral wrist and thumb CMC joint pain.  Right thumb also has history of MCP joint injuries and has some ulnar drift with subluxing EPL.  He does have some orthoses in prefabricated braces, but has not been doing a home exercise program.  He will benefit from outpatient occupational therapy to decrease symptoms and increase quality of life.   PERFORMANCE DEFICITS: in functional skills including ADLs, IADLs, coordination, dexterity, ROM, strength, pain, Fine motor control, body mechanics, endurance, decreased knowledge of precautions, and UE functional use, cognitive skills including problem solving and safety awareness, and psychosocial skills including coping strategies, environmental adaptation, habits, and routines and behaviors.   IMPAIRMENTS: are limiting patient from ADLs, IADLs, and leisure.   COMORBIDITIES: may have co-morbidities  that affects occupational performance. Patient will benefit from skilled OT to address above impairments and improve overall function.  MODIFICATION OR ASSISTANCE TO COMPLETE EVALUATION: No modification of tasks or assist necessary to complete an evaluation.  OT OCCUPATIONAL PROFILE AND HISTORY: Problem focused assessment: Including review of records relating to presenting problem.  CLINICAL DECISION MAKING: LOW - limited treatment  options, no task modification necessary  REHAB POTENTIAL:  Excellent  EVALUATION COMPLEXITY: Low      PLAN:  OT FREQUENCY: 1-2x/week  OT DURATION: 4 weeks through 01/13/24 and up to 6 visits as needed   PLANNED INTERVENTIONS: 97535 self care/ADL training, 91478 therapeutic exercise, 97530 therapeutic activity, 97112 neuromuscular re-education, 97140 manual therapy, 97760 Orthotic Initial, 97763 Orthotic/Prosthetic subsequent, compression bandaging, Dry needling, energy conservation, coping strategies training, patient/family education, and DME and/or AE instructions  RECOMMENDED OTHER SERVICES: in PT for lower body issues   CONSULTED AND AGREED WITH PLAN OF CARE: Patient  PLAN FOR NEXT SESSION:   Review initial HEP and recommendations.  Add stretch for MCP joints of right hand digits 2 and 3 as needed.  Also for intrinsic tightness   Judie Noun, PTA 12/15/2023, 8:31 AM

## 2023-12-17 ENCOUNTER — Emergency Department (HOSPITAL_BASED_OUTPATIENT_CLINIC_OR_DEPARTMENT_OTHER)

## 2023-12-17 ENCOUNTER — Emergency Department (HOSPITAL_BASED_OUTPATIENT_CLINIC_OR_DEPARTMENT_OTHER)
Admission: EM | Admit: 2023-12-17 | Discharge: 2023-12-17 | Disposition: A | Discharge status: Home / Self Care | Attending: Emergency Medicine | Admitting: Emergency Medicine

## 2023-12-17 ENCOUNTER — Encounter (HOSPITAL_BASED_OUTPATIENT_CLINIC_OR_DEPARTMENT_OTHER): Payer: Self-pay

## 2023-12-17 DIAGNOSIS — R109 Unspecified abdominal pain: Secondary | ICD-10-CM | POA: Diagnosis present

## 2023-12-17 DIAGNOSIS — N3289 Other specified disorders of bladder: Secondary | ICD-10-CM | POA: Diagnosis not present

## 2023-12-17 DIAGNOSIS — K644 Residual hemorrhoidal skin tags: Secondary | ICD-10-CM | POA: Insufficient documentation

## 2023-12-17 DIAGNOSIS — Z7901 Long term (current) use of anticoagulants: Secondary | ICD-10-CM | POA: Diagnosis not present

## 2023-12-17 DIAGNOSIS — R339 Retention of urine, unspecified: Secondary | ICD-10-CM | POA: Insufficient documentation

## 2023-12-17 LAB — COMPREHENSIVE METABOLIC PANEL WITH GFR
ALT: 20 U/L (ref 0–44)
AST: 29 U/L (ref 15–41)
Albumin: 4.5 g/dL (ref 3.5–5.0)
Alkaline Phosphatase: 71 U/L (ref 38–126)
Anion gap: 15 (ref 5–15)
BUN: 29 mg/dL — ABNORMAL HIGH (ref 8–23)
CO2: 25 mmol/L (ref 22–32)
Calcium: 10.7 mg/dL — ABNORMAL HIGH (ref 8.9–10.3)
Chloride: 105 mmol/L (ref 98–111)
Creatinine, Ser: 1.85 mg/dL — ABNORMAL HIGH (ref 0.61–1.24)
GFR, Estimated: 39 mL/min — ABNORMAL LOW (ref >60)
Glucose, Bld: 108 mg/dL — ABNORMAL HIGH (ref 70–99)
Potassium: 4.6 mmol/L (ref 3.5–5.1)
Sodium: 145 mmol/L (ref 135–145)
Total Bilirubin: 0.5 mg/dL (ref 0.0–1.2)
Total Protein: 7.7 g/dL (ref 6.5–8.1)

## 2023-12-17 LAB — CBC WITH DIFFERENTIAL/PLATELET
Abs Immature Granulocytes: 0.05 10*3/uL (ref 0.00–0.07)
Basophils Absolute: 0.1 10*3/uL (ref 0.0–0.1)
Basophils Relative: 1 %
Eosinophils Absolute: 0.1 10*3/uL (ref 0.0–0.5)
Eosinophils Relative: 0 %
HCT: 48.6 % (ref 39.0–52.0)
Hemoglobin: 15.8 g/dL (ref 13.0–17.0)
Immature Granulocytes: 0 %
Lymphocytes Relative: 8 %
Lymphs Abs: 1 10*3/uL (ref 0.7–4.0)
MCH: 29 pg (ref 26.0–34.0)
MCHC: 32.5 g/dL (ref 30.0–36.0)
MCV: 89.2 fL (ref 80.0–100.0)
Monocytes Absolute: 0.8 10*3/uL (ref 0.1–1.0)
Monocytes Relative: 6 %
Neutro Abs: 11.1 10*3/uL — ABNORMAL HIGH (ref 1.7–7.7)
Neutrophils Relative %: 85 %
Platelets: 274 10*3/uL (ref 150–400)
RBC: 5.45 MIL/uL (ref 4.22–5.81)
RDW: 15.7 % — ABNORMAL HIGH (ref 11.5–15.5)
WBC: 13 10*3/uL — ABNORMAL HIGH (ref 4.0–10.5)
nRBC: 0 % (ref 0.0–0.2)

## 2023-12-17 LAB — URINALYSIS, ROUTINE W REFLEX MICROSCOPIC
Bilirubin Urine: NEGATIVE
Glucose, UA: 500 mg/dL — AB
Ketones, ur: NEGATIVE mg/dL
Leukocytes,Ua: NEGATIVE
Nitrite: NEGATIVE
RBC / HPF: >50 RBC/hpf (ref 0–5)
Specific Gravity, Urine: 1.009 (ref 1.005–1.030)
pH: 7 (ref 5.0–8.0)

## 2023-12-17 LAB — LIPASE, BLOOD: Lipase: 49 U/L (ref 11–51)

## 2023-12-17 MED ORDER — ONDANSETRON HCL 4 MG/2ML IJ SOLN
4.0000 mg | Freq: Once | INTRAMUSCULAR | Status: DC
  Filled 2023-12-17: qty 2

## 2023-12-17 MED ORDER — ONDANSETRON 4 MG PO TBDP
4.0000 mg | ORAL_TABLET | Freq: Once | ORAL | Status: DC
  Filled 2023-12-17: qty 1

## 2023-12-17 MED ORDER — IOHEXOL 300 MG/ML  SOLN
100.0000 mL | Freq: Once | INTRAMUSCULAR | Status: AC | PRN
  Administered 2023-12-17: 100 mL via INTRAVENOUS

## 2023-12-17 MED ORDER — HYDROMORPHONE HCL 1 MG/ML IJ SOLN
1.0000 mg | Freq: Once | INTRAMUSCULAR | Status: DC
Start: 2023-12-17 — End: 2023-12-17
  Filled 2023-12-17: qty 1

## 2023-12-17 NOTE — ED Provider Notes (Addendum)
 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  Patient with a complaint of some rectal perineal pain kind of for 2 to 3 weeks.  Got significantly worse last evening and throughout today.  Had a lot of anal pain.  Also feels like he is not able to void.  Has bilateral flank pain as well little bit of nausea a little bit of vomiting x 1.  Recently seen by urologist for bladder problems urinalysis was apparently normal so there was some mention that may be could be interstitial cystitis.  Patient is on Eliquis .  Past medical history significant for atrial flutter hyperaldosteronism also atrial fibs and flutter.  Hypertension prostatitis before past surgical history significance 10 removal in 2011 cardioversion in 2013 inguinal hernia repair as a child umbilical hernia repair in 2008 atrial fibrillation with ablation in 2014 left inguinal hernia repair in 2017 with insertion of mesh.  Left heart cath in 2019 past medical history significant for no tobacco use       Home Medications Prior to Admission medications   Medication Sig Start Date End Date Taking? Authorizing Provider  allopurinol  (ZYLOPRIM ) 100 MG tablet Take 100 mg by mouth 2 (two) times daily.    [provider]  ammonium lactate  (LAC-HYDRIN ) 12 % lotion Apply 1 Application topically as needed for dry skin. 10/18/23   Hyatt, Max T, DPM  apixaban  (ELIQUIS ) 5 MG TABS tablet Take 1 tablet (5 mg total) by mouth 2 (two) times daily. 04/11/19   [name], Lucie Ruts, DO  Ascorbic Acid  (VITAMIN C ) 1000 MG tablet Take 1,000 mg by mouth 3 (three) times a week.    [provider]  atorvastatin  (LIPITOR) 20 MG tablet Take 1 tablet (20 mg total) by mouth daily. 03/23/23 06/21/23  Boyce Byes, MD  BREZTRI  AEROSPHERE 160-9-4.8 MCG/ACT AERO  SMARTSIG:2 Puff(s) By Mouth Twice Daily Patient not taking: Reported on 12/15/2023    [provider]  Cholecalciferol (VITAMIN D -3 PO) Take by mouth.    [provider]  CIALIS 20 MG tablet Take 20 mg by mouth daily as needed for erectile dysfunction. 09/23/16   [provider]  diltiazem  (CARDIZEM  CD) 360 MG 24 hr capsule Take 360 mg by mouth every evening. 10/04/16   [provider]  eplerenone  (INSPRA ) 50 MG tablet Take 1 tablet (50 mg total) by mouth 2 (two) times daily. 10/24/23   Boyce Byes, MD  FIBER PO Taking 2 gummy by mouth daily    [provider]  Glucosamine 500 MG CAPS     [provider]  hydrochlorothiazide  (MICROZIDE ) 12.5 MG capsule Take 12.5 mg by mouth daily.    [provider]  losartan  (COZAAR ) 100 MG tablet Take 100 mg by mouth daily.    [provider]  metFORMIN (GLUCOPHAGE) 500 MG tablet Take 500 mg by mouth every morning.    [provider]  metoprolol  tartrate (LOPRESSOR ) 25 MG tablet Take 1 tablet by mouth every 8 hours as needed for afib HR over 100 11/09/22   Fenton, Clint R, PA  montelukast  (SINGULAIR ) 10 MG tablet TAKE 1 TABLET(10 MG) BY MOUTH AT BEDTIME 04/26/23   Antonio Baumgarten, NP  Multiple Vitamin (MULTIVITAMIN) capsule Take 1 capsule by mouth 3 (three) times  a week.    [provider]  Multiple Vitamins-Minerals (ZINC PO) Take 1 tablet by mouth as needed.    [provider]  MYRBETRIQ 50 MG TB24 tablet Take 50 mg by mouth daily. 03/31/22   [provider]  omeprazole (PRILOSEC) 40 MG capsule Take 40 mg by mouth daily before supper.     [provider]  potassium chloride  SA (KLOR-CON  M) 20 MEQ tablet TAKE 2 TABLETS BY MOUTH TWICE DAILY TODAY(AT LEAST 2 HOURS APART) FOR A TOTAL OF 80 MEQ AND THEN START 1 TABLET(20 MEQ) ONCE DAILY THEREAFTER 09/09/23   Boyce Byes, MD  PROAIR  HFA 108 (90 Base) MCG/ACT inhaler SMARTSIG:2 Inhalation Via  Inhaler Every 6 Hours PRN 12/26/19   [provider]  scopolamine  (TRANSDERM-SCOP) 1 MG/3DAYS Place 1 patch onto the skin as needed. 11/28/20   [provider]  sertraline  (ZOLOFT ) 100 MG tablet Take 100 mg by mouth daily. Patient not taking: Reported on 12/15/2023 06/18/20   [provider]  tamsulosin (FLOMAX) 0.4 MG CAPS capsule Take 0.4 mg by mouth every morning.    [provider]  TRULICITY 0.75 MG/0.5ML SOAJ Inject 0.75 mg into the skin once a week.    [provider]  vitamin B-12 (CYANOCOBALAMIN ) 1000 MCG tablet Take 1,000 mcg by mouth daily.    [provider]  XIGDUO XR 11-998 MG TB24 Take 1 tablet by mouth daily. 03/19/22   [provider]      Allergies    Aldactone  [spironolactone ], Codeine, Keflex [cephalexin], Lisinopril, Nexium [esomeprazole magnesium ], Sulfa antibiotics, and Sulfa drugs cross reactors    Review of Systems   Review of Systems  Constitutional:  Negative for chills and fever.  HENT:  Negative for ear pain and sore throat.   Eyes:  Negative for pain and visual disturbance.  Respiratory:  Negative for cough and shortness of breath.   Cardiovascular:  Negative for chest pain and palpitations.  Gastrointestinal:  Positive for abdominal pain, nausea, rectal pain and vomiting. Negative for blood in stool and diarrhea.  Genitourinary:  Positive for difficulty urinating and flank pain. Negative for dysuria and hematuria.  Musculoskeletal:  Negative for arthralgias and back pain.  Skin:  Negative for color change and rash.  Neurological:  Negative for seizures and syncope.  All other systems reviewed and are negative.   Physical Exam Updated Vital Signs BP (!) 162/119 (BP Location: Right Arm)   Pulse 85   Temp 97.7 F (36.5 C) (Oral)   Resp (!) 21   SpO2 97%  Physical Exam Vitals and nursing note reviewed.  Constitutional:      General: He is not in acute distress.    Appearance: Normal appearance.  He is well-developed.  HENT:     Head: Normocephalic and atraumatic.  Eyes:     Extraocular Movements: Extraocular movements intact.     Conjunctiva/sclera: Conjunctivae normal.     Pupils: Pupils are equal, round, and reactive to light.  Cardiovascular:     Rate and Rhythm: Normal rate and regular rhythm.     Heart sounds: No murmur heard. Pulmonary:     Effort: Pulmonary effort is normal. No respiratory distress.     Breath sounds: Normal breath sounds.  Abdominal:     Palpations: Abdomen is soft.     Tenderness: There is no abdominal tenderness.     Comments: Bladder is distended up to the umbilicus.  Genitourinary:    Penis: Normal.  Testes: Normal.     Comments: Scrotum normal.  Perianal area with some skin tags.  But no evidence of fissure stool is light brown.  No evidence of any perianal abscess no induration no erythema.  Also no evidence of any scrotal swelling or concerns for scrotal infection. Musculoskeletal:        General: No swelling.     Cervical back: Neck supple.     Right lower leg: No edema.     Left lower leg: No edema.  Skin:    General: Skin is warm and dry.     Capillary Refill: Capillary refill takes less than 2 seconds.  Neurological:     General: No focal deficit present.     Mental Status: He is alert and oriented to person, place, and time.  Psychiatric:        Mood and Affect: Mood normal.     ED Results / Procedures / Treatments   Labs (all labs ordered are listed, but only abnormal results are displayed) Labs Reviewed  CBC WITH DIFFERENTIAL/PLATELET - Abnormal; Notable for the following components:      Result Value   WBC 13.0 (*)    RDW 15.7 (*)    Neutro Abs 11.1 (*)    All other components within normal limits  COMPREHENSIVE METABOLIC PANEL WITH GFR  LIPASE, BLOOD    EKG None  Radiology No results found.  Procedures Procedures    Medications Ordered in ED Medications  HYDROmorphone  (DILAUDID ) injection 1 mg (1 mg  Intravenous Patient Refused/Not Given 12/17/23 1419)  ondansetron  (ZOFRAN ) injection 4 mg (4 mg Intravenous Patient Refused/Not Given 12/17/23 1421)    ED Course/ Medical Decision Making/ A&P                                 Medical Decision Making Amount and/or Complexity of Data Reviewed Labs: ordered. Radiology: ordered.  Risk Prescription drug management.   Clinically bladder distended.  Will get bladder scan.  Will get CT abdomen pelvis try to sort out the bilateral flank pain as well as the perianal pain.  Will give pain medication and antinausea medicine.  Will get labs.  Patient's bladder scan showed about 800 cc of urine.  Foley catheter was placed and got out about 900 cc.  So certainly had urinary retention.  White count 13,000 hemoglobin 15.8 platelets 274.  Complete metabolic panel pending.  Lipase pending.  And CT abdomen and pelvis is pending.  This is to evaluate the perirectal discomfort.  Complete metabolic panel is back.  GFR 39 creatinine 1.85 BUN 29.  LFTs are normal.  Lipase normal at 49.  CT abdomen pelvis is pending.  Patient will need to leave Foley catheter in place for urinary retention.  Some concerns about may be some degree of some acute kidney injury.  Will give some IV fluids.  Patient's urinalysis pending and also urine sent for culture.  Patient's creatinine is really unchanged from previously.  No significant change in GFR.  Do not feel that we need to give the fluids.  To see what we get on CT abdomen pelvis.   Final Clinical Impression(s) / ED Diagnoses Final diagnoses:  Urinary retention    Rx / DC Orders ED Discharge Orders     None         Nicklas Barns, MD 12/17/23 1312    Nicklas Barns, MD 12/17/23 1451    Maira Christon,  Geralyn Knee, MD 12/17/23 1523    Nicklas Barns, MD 12/17/23 1524

## 2023-12-17 NOTE — ED Notes (Signed)
 Patient transported to CT

## 2023-12-17 NOTE — Discharge Instructions (Signed)
 Call the urology office on Tuesday to set up an appointment.  Typically they will see you within the week to reassess you and see if you can urinate on your own.  Please return for sudden worsening pain or if you are unable to urinate.  Return for fever.

## 2023-12-17 NOTE — ED Notes (Signed)
 Bladder scan amount 857 mL. MD made aware.

## 2023-12-17 NOTE — ED Notes (Signed)
 Pt requested to go home with standard drainage bag. Provided pt with leg bag to take home. Explained proper use and how to switch standard drainage bag to leg bag. Pt verbalized understanding.

## 2023-12-17 NOTE — ED Provider Notes (Signed)
 Received patient in turnover from Dr. Zackowski.  Please see their note for further details of Hx, PE.  Briefly patient is a 68 y.o. male with a Rectal Pain .  Difficulty urinating.  Foley placed.  Feeling better. Awaiting CT. UA.   CT scan without obvious acute intra-abdominal pathology.  UA with white cells but no bacteria.  Will hold off on antibiotic therapy.  Have him follow-up with the urologist in the office.    Albertus Hughs, DO 12/17/23 [phone]

## 2023-12-17 NOTE — ED Notes (Addendum)
 Reviewed discharge instructions and follow up care with pt. Pt verbalized understanding and had no further questions. Pt exited ED without complications.

## 2023-12-17 NOTE — ED Triage Notes (Signed)
 Pt. Reports rectal and perineal discomfort recently, worse since yesterday. He also c/o "lightheaded". Denies fever. He also reports "working with a urologist for bladder problems" recently.

## 2023-12-18 LAB — URINE CULTURE: Culture: NO GROWTH

## 2023-12-20 ENCOUNTER — Ambulatory Visit: Admitting: Sports Medicine

## 2023-12-21 NOTE — Therapy (Signed)
 OUTPATIENT OCCUPATIONAL THERAPY TREATMENT NOTE  Patient Name: Casey DOMBEK, MD MRN: [id number] DOB:1956/06/10, 68 y.o., male Today's Date: 12/22/2023  PCP: Ardella Konig MD REFERRING PROVIDER: Gean Keels, MD   END OF SESSION:    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HCC)    ongoing evaluation for elevated R>L adosterone secretion   Arthritis    Atrial enlargement, bilateral    Atrial fibrillation (HCC)    paroxysmal   Atrial flutter (HCC)    Complication of anesthesia    could not urinate after UHR, needed cath, mental fog sinc cervical fusion 08-09-16    Dysrhythmia    a-fib-had ablation x2   GERD (gastroesophageal reflux disease)    Hyperaldosteronism (HCC)    Hypertension    Kidney cysts    "multiple"   Nephrolithiasis    Pneumonia 1990's   Pre-diabetes    Prostatitis    Renal insufficiency    "Cr 1.6"   Past Surgical History:  Procedure Laterality Date   ABLATION     ATRIAL FIBRILLATION ABLATION N/A 09/28/2012   Procedure: ATRIAL FIBRILLATION ABLATION;  Surgeon: Jolly Needle, MD;  Location: [facility];  Service: Cardiovascular;  Laterality: N/A;   atrial fibrillation ablation with CTI ablation  05/27/11, 09/28/12   ablation for afib and atrial flutter by Dr Pennye Boy SURGERY  08/09/2016   C 5 6  C 6 to C 7 cervical fusion   CARDIOVERSION  07/28/2011   Procedure: CARDIOVERSION;  Surgeon: Lenise Quince, MD;  Location: [facility] OR;  Service: Cardiovascular;  Laterality: N/A;   COLONOSCOPY WITH PROPOFOL  N/A 03/03/2015   Procedure: COLONOSCOPY WITH PROPOFOL ;  Surgeon: Garrett Kallman, MD;  Location: [facility];  Service: Gastroenterology;  Laterality: N/A;   double j stent placement  06/2004   left   ESOPHAGOGASTRODUODENOSCOPY (EGD) WITH PROPOFOL  N/A 03/03/2015   Procedure: ESOPHAGOGASTRODUODENOSCOPY (EGD) WITH PROPOFOL ;  Surgeon: Garrett Kallman, MD;  Location: [facility];  Service: Gastroenterology;  Laterality:  N/A;   ESOPHAGOGASTRODUODENOSCOPY (EGD) WITH PROPOFOL  N/A 10/25/2016   Procedure: ESOPHAGOGASTRODUODENOSCOPY (EGD) WITH PROPOFOL ;  Surgeon: Garrett Kallman, MD;  Location: [facility];  Service: Endoscopy;  Laterality: N/A;   INGUINAL HERNIA REPAIR     "as a child; ? right"   INGUINAL HERNIA REPAIR Left 06/23/2016   Procedure: OPEN LEFT INGUINAL HERNIA  REPAIR WITH MESH;  Surgeon: Jacolyn Matar, MD;  Location: [facility];  Service: General;  Laterality: Left;   INSERTION OF MESH Left 06/23/2016   Procedure: INSERTION OF MESH;  Surgeon: Jacolyn Matar, MD;  Location: [facility];  Service: General;  Laterality: Left;   LEFT HEART CATH AND CORONARY ANGIOGRAPHY N/A 07/25/2018   Procedure: LEFT HEART CATH AND CORONARY ANGIOGRAPHY;  Surgeon: Arty Binning, MD;  Location: [facility]VAS[facility] CV [facility];  Service: Cardiovascular;  Laterality: N/A;   LUNG BIOPSY  1993   NASAL SEPTOPLASTY W/ TURBINOPLASTY  07/2006   prostate biopsy  2011   schwannoma removal  ~01/2010   left thorax   TEE WITHOUT CARDIOVERSION N/A 09/27/2012   Procedure: TRANSESOPHAGEAL ECHOCARDIOGRAM (TEE);  Surgeon: Darlis Eisenmenger, MD;  Location: [facility];  Service: Cardiovascular;  Laterality: N/A;   ULTRASOUND GUIDANCE FOR VASCULAR ACCESS  07/25/2018   Procedure: Ultrasound Guidance For Vascular Access;  Surgeon: Arty Binning, MD;  Location[facility] INVASIVE CV LAB;  Service: Cardiovascular;;   UMBILICAL HERNIA REPAIR  07/2006   Patient Active Problem List  Diagnosis Date Noted   Spinal stenosis of lumbar region with neurogenic claudication 11/14/2023   Hoarseness 10/04/2023   Primary osteoarthritis both carpometacarpal joints and right second metacarpophalangeal joint 09/27/2023   Hypercoagulable state due to paroxysmal atrial fibrillation (HCC) 11/10/2022   Asthma, intermittent 11/01/2022   Status post implantation of urinary electronic stimulator device 10/12/2022   Trochanteric bursitis, left hip  02/22/2022   Bilateral impacted cerumen 10/23/2020   Laryngopharyngeal reflux (LPR) 10/23/2020   Vocal cord nodule 10/23/2020   Bilateral elbow joint pain 06/25/2020   Lateral epicondylitis of left elbow 06/25/2020   Moderate COPD (chronic obstructive pulmonary disease) (HCC) 03/26/2020   OSA (obstructive sleep apnea) 03/26/2020   Pulmonary emboli (HCC) 03/14/2019   Chronic respiratory failure with hypoxia (HCC) 08/18/2018   Hyperlipidemia 08/17/2018   CKD (chronic kidney disease), stage II 08/16/2018   Abnormal ECG 07/25/2018   Incontinence of feces 04/18/2018   Urge incontinence of urine 03/11/2018   Renal insufficiency    Prostatitis    Pre-diabetes    Nephrolithiasis    Kidney cysts    Hyperaldosteronism (HCC)    GERD (gastroesophageal reflux disease)    Dysrhythmia    Complication of anesthesia    Atrial enlargement, bilateral    Arthritis    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HCC)    Inguinal hernia of left side without obstruction or gangrene Nov 2017 06/23/2016   Adjustment disorder with depressed mood 05/11/2016   Atrial fibrillation (HCC) 03/18/2011   Atrial flutter (HCC) 03/18/2011   Hypertension 03/18/2011    ONSET DATE: chronic   REFERRING DIAG: M19.041 (ICD-10-CM) - Primary osteoarthritis, right hand   THERAPY DIAG:  Pain in joint of right hand  Pain in joint of left hand  Muscle weakness (generalized)  Other lack of coordination  Rationale for Evaluation and Treatment: Rehabilitation  PERTINENT HISTORY: Pre PT note: "Pt reports he has bilat hand pain as well as history of degenerative changes at the back. Pt reports MCP joint pain into both hands. Recently had injections into bilat CMC joints.  Pt does have hand weakness into both sides with R slightly worse than L. Pt works with a Systems analyst 2x/wk.  "  Past cervical fusion  He shows up bit late for eval, states he plays the piano and the bassoon, is very active playing pickle ball, etc.  He has  been having pain and problems doing small buttons, gripping objects, using his hand for instruments, etc.  He did bring several braces today including old orthoses and prefabricated braces.  He has questions about pain management.  He states he has no current stretch program for his hands.  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   PRECAUTIONS: None  RED FLAGS: None   WEIGHT BEARING RESTRICTIONS: No   SUBJECTIVE:   SUBJECTIVE STATEMENT: He was a bit late today, states he has not been able to focus on his exercises much due to new bladder infection and he now has a Foley catheter.  OT encourages him to try to get well but do the best that he can with his hand issues.    PAIN:  Are you having pain? Yes: NPRS scale:   1/10 at rest now Pain location: Bilateral wrists and thumbs Pain description: Aching and sore Aggravating factors: Strong gripping and squeezing Relieving factors: Rest   PATIENT GOALS: To get relief from bilateral hand and thumb and wrist pain  NEXT MD VISIT: As needed  OBJECTIVE: (All objective assessments below are from initial evaluation on: 12/12/23 unless otherwise specified.)   HAND DOMINANCE: Right   ADLs: Overall ADLs: States decreased ability to grab, hold household objects, pain and difficulty to open containers, perform FMS tasks (manipulate fasteners on clothing)   FUNCTIONAL OUTCOME MEASURES: Eval: Patient Specific Functional Scale: 6.8 (playing piano, playing bassoon, shaving, opening jars, putting on TED hose)  (Higher Score  =  Better Ability for the Selected Tasks)      UPPER EXTREMITY ROM     Shoulder to Wrist AROM Right eval Left eval  Forearm supination    Forearm pronation     Wrist flexion 75 71  Wrist extension 69 71  Wrist ulnar deviation    Wrist radial deviation    (Blank rows = not tested)   Hand AROM Right eval Left eval  Full Fist Ability (or Gap to Distal Palmar Crease) Full Full   Thumb Opposition  (Kapandji Scale)  9/10 9/10  Thumb MCP (0-60) 47 43  Thumb IP (0-80) 58 42  Thumb Radial Abduction Span 48 44   Thumb Palmar Abduction Span     Index MCP (0-90)     Index PIP (0-100)     Index DIP (0-70)      Long MCP (0-90)      Long PIP (0-100)      Long DIP (0-70)      Ring MCP (0-90)      Ring PIP (0-100)      Ring DIP (0-70)      Little MCP (0-90)      Little PIP (0-100)      Little DIP (0-70)      (Blank rows = not tested)   UPPER EXTREMITY MMT:    Eval:  NT at eval, will be tested when appropriate.   MMT Right TBD Left TBD  Shoulder flexion    Shoulder abduction    Shoulder adduction    Shoulder extension    Shoulder internal rotation    Shoulder external rotation    Middle trapezius    Lower trapezius    Elbow flexion    Elbow extension    Forearm supination    Forearm pronation    Wrist flexion    Wrist extension    Wrist ulnar deviation    Wrist radial deviation    (Blank rows = not tested)  HAND FUNCTION: Eval: Observed weakness in affected Lt hand.  Grip strength Right: 70 lbs, Left: 56 lbs   COORDINATION: Eval: Observed coordination impairments with affected Lt hand. 9 Hole Peg Test Right: 24sec, Left: 27 sec (approximately 23 or 24 sec is WFL)   SENSATION: Eval:  Light touch intact today no paresthesia complaints  EDEMA:   Eval:  Mildly swollen in MCP joint of right thumb, index, middle fingers, also mildly in bilateral wrists today  COGNITION: Eval: Overall cognitive status: WFL for evaluation today   OBSERVATIONS:   Eval: Right thumb MCP joint has EPL subluxation and ulnar drift apparently old injuries.  CMC joints are both collapsing somewhat and wrists are both grinding with some crepitus felt in left wrist is stiffer than right wrist.  He also has positive test of intrinsic tightness   TODAY'S TREATMENT:  12/22/23: While he is on moist heat for approximately 3 to 5 minutes, OT reviews his home exercise program  and adds to new stretches today for MCP joint stiffness and IP joint arthritis and intrinsic hand tightness.  These things  are bolded below.  These are gone over with him in depth today and he performs back his exercises showing good ability, no pain, tolerating well.  He will now try to manage this for several weeks and also deal with his other health issues before returning for follow-up   Exercises - Seated Wrist Flexion Stretch  - 3 x daily - 3 reps - 15 hold - Wrist Prayer Stretch  - 3 x daily - 3 reps - 15 sec hold - Stretch thumb toward base of small finger (put hand in LAP)  - 2-3 x daily - 3-5 reps - 15 sec hold - Thumb PROM Composite Extension  - 2-3 x daily - 3 reps - 15 sec hold - Thumb Webspace Stretch  - 3 x daily - 3 reps - 15 sec hold - Towel Roll Grip with Forearm in Neutral  - 3 x daily - 5 reps - 10 sec hold - BACK KNUCKLE STRETCHES   - 4 x daily - 3-5 reps - 15 sec hold - HOOK Stretch  - 4 x daily - 3-5 reps - 15-20 sec hold  PATIENT EDUCATION: Education details: See tx section above for details  Person educated: Patient Education method: Engineer, structural, Teach back, Handouts  Education comprehension: States and demonstrates understanding, Additional Education required    HOME EXERCISE PROGRAM: Access Code: [email or web address] URL: [email or web address] Date: 12/12/2023 Prepared by: Leartis Proud    GOALS: Goals reviewed with patient? Yes   SHORT TERM GOALS: (STG required if POC>30 days) Target Date: 12/23/2023  Pt will demo/state understanding of initial HEP to improve pain levels and prerequisite motion. Goal status: INITIAL   LONG TERM GOALS: Target Date: 01/13/2024  Pt will improve functional ability by decreased impairment per PSFS assessment from 6.8 to 8 or better, for better quality of life. Goal status: INITIAL  2.  Pt will improve grip strength in left hand from 56 lbs to at least 60 to lbs for functional use at home and in IADLs. Goal  status: INITIAL  3.  Pt will improve A/ROM in right wrist extension from 69 to at least 74, to have functional motion for tasks like reach and grasp.  Goal status: INITIAL  4.  Pt will improve strength in TBD (pending baseline measures in the next 1-2 sessions) to have increased functional ability to carry out selfcare and higher-level homecare tasks with less difficulty. Goal status: INITIAL  5.  Pt will decrease pain at worst from 6/10 to 2/10 or better to have better sleep and occupational participation in daily roles. Goal status: INITIAL   ASSESSMENT:  CLINICAL IMPRESSION: 12/22/23: He now has a good home program, he just needs to be consistent with it.  Other current health issues may be negative factors with him doing well with this  Eval: Patient is a 67y.o. male who was seen today for occupational therapy evaluation for bilateral wrist and thumb CMC joint pain.  Right thumb also has history of MCP joint injuries and has some ulnar drift with subluxing EPL.  He does have some orthoses in prefabricated braces, but has not been doing a home exercise program.  He will benefit from outpatient occupational therapy to decrease symptoms and increase quality of life.     PLAN:  OT FREQUENCY: 1-2x/week  OT DURATION: 4 weeks through 01/13/24 and up to 6 visits as needed   PLANNED INTERVENTIONS: 97535 self care/ADL training, 16109 therapeutic exercise, 97530 therapeutic activity, 97112 neuromuscular re-education, 97140 manual therapy, 97760 Orthotic  Initial, 40981 Orthotic/Prosthetic subsequent, compression bandaging, Dry needling, energy conservation, coping strategies training, patient/family education, and DME and/or AE instructions  RECOMMENDED OTHER SERVICES: in PT for lower body issues   CONSULTED AND AGREED WITH PLAN OF CARE: Patient  PLAN FOR NEXT SESSION:   Check on him and about 2 weeks to see if he is able to manage his stiffness and arthritis himself now.   Leartis Proud, OTR/L, CHT 12/22/2023, 8:11 AM

## 2023-12-22 ENCOUNTER — Ambulatory Visit (INDEPENDENT_AMBULATORY_CARE_PROVIDER_SITE_OTHER): Admitting: Rehabilitative and Restorative Service Providers"

## 2023-12-22 ENCOUNTER — Encounter: Payer: Self-pay | Admitting: Rehabilitative and Restorative Service Providers"

## 2023-12-22 DIAGNOSIS — M25542 Pain in joints of left hand: Secondary | ICD-10-CM

## 2023-12-22 DIAGNOSIS — R278 Other lack of coordination: Secondary | ICD-10-CM

## 2023-12-22 DIAGNOSIS — M6281 Muscle weakness (generalized): Secondary | ICD-10-CM

## 2023-12-22 DIAGNOSIS — M25541 Pain in joints of right hand: Secondary | ICD-10-CM | POA: Diagnosis not present

## 2023-12-26 ENCOUNTER — Encounter: Admitting: Rehabilitative and Restorative Service Providers"

## 2023-12-28 ENCOUNTER — Encounter (HOSPITAL_BASED_OUTPATIENT_CLINIC_OR_DEPARTMENT_OTHER): Admitting: Physical Therapy

## 2024-01-02 ENCOUNTER — Encounter: Admitting: Rehabilitative and Restorative Service Providers"

## 2024-01-03 ENCOUNTER — Telehealth: Payer: Self-pay | Admitting: Internal Medicine

## 2024-01-03 NOTE — Telephone Encounter (Signed)
 New Message:     Patient said he was just reviewing his Calcium  score which is 174. His question is, is there anything that Dr Maximo Spar want to do about this, at this time?

## 2024-01-03 NOTE — Telephone Encounter (Signed)
Returned call to patient left message on personal voice mail to call back. 

## 2024-01-04 NOTE — Telephone Encounter (Signed)
 Spoke with pt, his wife recently had a calcium  score and her cardiologist increased her statin. She wanted him to ask dr hilty if he needed increased therapy. He is currently taking atorvastatin  20 mg once daily. Aware will forward to dr hilty to review

## 2024-01-04 NOTE — Therapy (Incomplete)
 OUTPATIENT OCCUPATIONAL THERAPY TREATMENT & *** NOTE  Patient Name: Casey ELLS, MD MRN: [id number] DOB:May 27, 1956, 68 y.o., male Today's Date: 01/04/2024  PCP: Ardella Konig MD REFERRING PROVIDER: Gean Keels, MD          ***         END OF SESSION:    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HCC)    ongoing evaluation for elevated R>L adosterone secretion   Arthritis    Atrial enlargement, bilateral    Atrial fibrillation (HCC)    paroxysmal   Atrial flutter (HCC)    Complication of anesthesia    could not urinate after UHR, needed cath, mental fog sinc cervical fusion 08-09-16    Dysrhythmia    a-fib-had ablation x2   GERD (gastroesophageal reflux disease)    Hyperaldosteronism (HCC)    Hypertension    Kidney cysts    multiple   Nephrolithiasis    Pneumonia 1990's   Pre-diabetes    Prostatitis    Renal insufficiency    Cr 1.6   Past Surgical History:  Procedure Laterality Date   ABLATION     ATRIAL FIBRILLATION ABLATION N/A 09/28/2012   Procedure: ATRIAL FIBRILLATION ABLATION;  Surgeon: Jolly Needle, MD;  Location: [facility]ATH [facility];  Service: Cardiovascular;  Laterality: N/A;   atrial fibrillation ablation with CTI ablation  05/27/11, 09/28/12   ablation for afib and atrial flutter by Dr Pennye Boy SURGERY  08/09/2016   C 5 6  C 6 to C 7 cervical fusion   CARDIOVERSION  07/28/2011   Procedure: CARDIOVERSION;  Surgeon: Lenise Quince, MD;  Location: [facility] OR;  Service: Cardiovascular;  Laterality: N/A;   COLONOSCOPY WITH PROPOFOL  N/A 03/03/2015   Procedure: COLONOSCOPY WITH PROPOFOL ;  Surgeon: Garrett Kallman, MD;  Location: [facility] ENDOSCOPY;  Service: Gastroenterology;  Laterality: N/A;   double j stent placement  06/2004   left   ESOPHAGOGASTRODUODENOSCOPY (EGD) WITH PROPOFOL  N/A 03/03/2015   Procedure: ESOPHAGOGASTRODUODENOSCOPY (EGD) WITH PROPOFOL ;  Surgeon: Garrett Kallman, MD;  Location: [facility]OS[facility];   Service: Gastroenterology;  Laterality: N/A;   ESOPHAGOGASTRODUODENOSCOPY (EGD) WITH PROPOFOL  N/A 10/25/2016   Procedure: ESOPHAGOGASTRODUODENOSCOPY (EGD) WITH PROPOFOL ;  Surgeon: Garrett Kallman, MD;  Location: [facility];  Service: Endoscopy;  Laterality: N/A;   INGUINAL HERNIA REPAIR     as a child; ? right   INGUINAL HERNIA REPAIR Left 06/23/2016   Procedure: OPEN LEFT INGUINAL HERNIA  REPAIR WITH MESH;  Surgeon: Jacolyn Matar, MD;  Location: [facility];  Service: General;  Laterality: Left;   INSERTION OF MESH Left 06/23/2016   Procedure: INSERTION OF MESH;  Surgeon: Jacolyn Matar, MD;  Location: [facility];  Service: General;  Laterality: Left;   LEFT HEART CATH AND CORONARY ANGIOGRAPHY N/A 07/25/2018   Procedure: LEFT HEART CATH AND CORONARY ANGIOGRAPHY;  Surgeon: Arty Binning, MD;  Location: [facility] INVASIVE CV LAB;  Service: Cardiovascular;  Laterality: N/A;   LUNG BIOPSY  1993   NASAL SEPTOPLASTY W/ TURBINOPLASTY  07/2006   prostate biopsy  2011   schwannoma removal  ~01/2010   left thorax   TEE WITHOUT CARDIOVERSION N/A 09/27/2012   Procedure: TRANSESOPHAGEAL ECHOCARDIOGRAM (TEE);  Surgeon: Darlis Eisenmenger, MD;  Location: [facility] ENDOSCOPY;  Service: Cardiovascular;  Laterality: N/A;   ULTRASOUND GUIDANCE FOR VASCULAR ACCESS  07/25/2018   Procedure: Ultrasound Guidance For Vascular Access;  Surgeon: Arty Binning, MD;  Location: [facility] INVASIVE CV  LAB;  Service: Cardiovascular;;   UMBILICAL HERNIA REPAIR  07/2006   Patient Active Problem List   Diagnosis Date Noted   Spinal stenosis of lumbar region with neurogenic claudication 11/14/2023   Hoarseness 10/04/2023   Primary osteoarthritis both carpometacarpal joints and right second metacarpophalangeal joint 09/27/2023   Hypercoagulable state due to paroxysmal atrial fibrillation (HCC) 11/10/2022   Asthma, intermittent 11/01/2022   Status post implantation of urinary electronic stimulator device 10/12/2022    Trochanteric bursitis, left hip 02/22/2022   Bilateral impacted cerumen 10/23/2020   Laryngopharyngeal reflux (LPR) 10/23/2020   Vocal cord nodule 10/23/2020   Bilateral elbow joint pain 06/25/2020   Lateral epicondylitis of left elbow 06/25/2020   Moderate COPD (chronic obstructive pulmonary disease) (HCC) 03/26/2020   OSA (obstructive sleep apnea) 03/26/2020   Pulmonary emboli (HCC) 03/14/2019   Chronic respiratory failure with hypoxia (HCC) 08/18/2018   Hyperlipidemia 08/17/2018   CKD (chronic kidney disease), stage II 08/16/2018   Abnormal ECG 07/25/2018   Incontinence of feces 04/18/2018   Urge incontinence of urine 03/11/2018   Renal insufficiency    Prostatitis    Pre-diabetes    Nephrolithiasis    Kidney cysts    Hyperaldosteronism (HCC)    GERD (gastroesophageal reflux disease)    Dysrhythmia    Complication of anesthesia    Atrial enlargement, bilateral    Arthritis    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HCC)    Inguinal hernia of left side without obstruction or gangrene Nov 2017 06/23/2016   Adjustment disorder with depressed mood 05/11/2016   Atrial fibrillation (HCC) 03/18/2011   Atrial flutter (HCC) 03/18/2011   Hypertension 03/18/2011    ONSET DATE: chronic   REFERRING DIAG: M19.041 (ICD-10-CM) - Primary osteoarthritis, right hand   THERAPY DIAG:  No diagnosis found.  Rationale for Evaluation and Treatment: Rehabilitation  PERTINENT HISTORY: Pre PT note: Pt reports he has bilat hand pain as well as history of degenerative changes at the back. Pt reports MCP joint pain into both hands. Recently had injections into bilat CMC joints.  Pt does have hand weakness into both sides with R slightly worse than L. Pt works with a Systems analyst 2x/wk.    Past cervical fusion  He shows up bit late for eval, states he plays the piano and the bassoon, is very active playing pickle ball, etc.  He has been having pain and problems doing small buttons, gripping objects,  using his hand for instruments, etc.  He did bring several braces today including old orthoses and prefabricated braces.  He has questions about pain management.  He states he has no current stretch program for his hands.  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   PRECAUTIONS: None  RED FLAGS: None   WEIGHT BEARING RESTRICTIONS: No   SUBJECTIVE:   SUBJECTIVE STATEMENT: He was a bit late today, states he has not been able to focus on his exercises much due to new bladder infection and he now has a Foley catheter.  OT encourages him to try to get well but do the best that he can with his hand issues.    PAIN:  Are you having pain? Yes: NPRS scale:  ***  1/10 at rest now Pain location: Bilateral wrists and thumbs Pain description: Aching and sore Aggravating factors: Strong gripping and squeezing Relieving factors: Rest   PATIENT GOALS: To get relief from bilateral hand and thumb and wrist pain  NEXT MD VISIT: As needed  OBJECTIVE: (All objective assessments below are from initial evaluation on: 12/12/23 unless otherwise specified.)   HAND DOMINANCE: Right   ADLs: Overall ADLs: States decreased ability to grab, hold household objects, pain and difficulty to open containers, perform FMS tasks (manipulate fasteners on clothing)   FUNCTIONAL OUTCOME MEASURES: Eval: Patient Specific Functional Scale: 6.8 (playing piano, playing bassoon, shaving, opening jars, putting on TED hose)  (Higher Score  =  Better Ability for the Selected Tasks)      UPPER EXTREMITY ROM     Shoulder to Wrist AROM Right eval Left eval Rt / Lt 01/09/24  Forearm supination     Forearm pronation      Wrist flexion 75 71 ***  /  ***  Wrist extension 69 71 ***  /  ***  Wrist ulnar deviation     Wrist radial deviation     (Blank rows = not tested)   Hand AROM Right eval Left eval Rt / Lt 01/09/24  Full Fist Ability (or Gap to Distal Palmar Crease) Full Full    Thumb Opposition  (Kapandji Scale)  9/10 9/10   Thumb MCP (0-60) 47 43 ***  /  ***  Thumb IP (0-80) 58 42 ***  /  ***  Thumb Radial Abduction Span 48 44  ***  /  ***  Thumb Palmar Abduction Span      Index MCP (0-90)      Index PIP (0-100)      Index DIP (0-70)       Long MCP (0-90)       Long PIP (0-100)       Long DIP (0-70)       Ring MCP (0-90)       Ring PIP (0-100)       Ring DIP (0-70)       Little MCP (0-90)       Little PIP (0-100)       Little DIP (0-70)       (Blank rows = not tested)   HAND FUNCTION: 01/09/24: *** grip Rt: ***#; Lt: ***#    Eval: Observed weakness in affected Lt hand.  Grip strength Right: 70 lbs, Left: 56 lbs   COORDINATION: 01/09/24: 9HPT Lt: ***sec    Eval: Observed coordination impairments with affected Lt hand. 9 Hole Peg Test Right: 24sec, Left: 27 sec (approximately 23 or 24 sec is WFL)    OBSERVATIONS:   Eval: Right thumb MCP joint has EPL subluxation and ulnar drift apparently old injuries.  CMC joints are both collapsing somewhat and wrists are both grinding with some crepitus felt in left wrist is stiffer than right wrist.  He also has positive test of intrinsic tightness   TODAY'S TREATMENT:  01/09/24: *** Check on him and about 2 weeks to see if he is able to manage his stiffness and arthritis himself now.    12/22/23: While he is on moist heat for approximately 3 to 5 minutes, OT reviews his home exercise program and adds to new stretches today for MCP joint stiffness and IP joint arthritis and intrinsic hand tightness.  These things are bolded below.  These are gone over with him in depth today and he performs back his exercises showing good ability, no pain, tolerating well.  He will now try to manage this for several weeks and also deal with his other health issues before returning for follow-up   Exercises - Seated Wrist Flexion Stretch  - 3 x daily - 3  reps - 15 hold - Wrist Prayer Stretch  - 3 x daily - 3 reps - 15 sec  hold - Stretch thumb toward base of small finger (put hand in LAP)  - 2-3 x daily - 3-5 reps - 15 sec hold - Thumb PROM Composite Extension  - 2-3 x daily - 3 reps - 15 sec hold - Thumb Webspace Stretch  - 3 x daily - 3 reps - 15 sec hold - Towel Roll Grip with Forearm in Neutral  - 3 x daily - 5 reps - 10 sec hold - BACK KNUCKLE STRETCHES   - 4 x daily - 3-5 reps - 15 sec hold - HOOK Stretch  - 4 x daily - 3-5 reps - 15-20 sec hold  PATIENT EDUCATION: Education details: See tx section above for details  Person educated: Patient Education method: Engineer, structural, Teach back, Handouts  Education comprehension: States and demonstrates understanding, Additional Education required    HOME EXERCISE PROGRAM: Access Code: [email or web address] URL: [email or web address] Date: 12/12/2023 Prepared by: Leartis Proud    GOALS: Goals reviewed with patient? Yes   SHORT TERM GOALS: (STG required if POC>30 days) Target Date: 12/23/2023  Pt will demo/state understanding of initial HEP to improve pain levels and prerequisite motion. Goal status: INITIAL   LONG TERM GOALS: Target Date: 01/13/2024  Pt will improve functional ability by decreased impairment per PSFS assessment from 6.8 to 8 or better, for better quality of life. Goal status: 01/09/24: ***  2.  Pt will improve grip strength in left hand from 56 lbs to at least 60 to lbs for functional use at home and in IADLs. Goal status:01/09/24: ***  3.  Pt will improve A/ROM in right wrist extension from 69 to at least 74, to have functional motion for tasks like reach and grasp.  Goal status:01/09/24: ***  4.  Pt will improve strength in TBD (pending baseline measures in the next 1-2 sessions) to have increased functional ability to carry out selfcare and higher-level homecare tasks with less difficulty. Goal status:01/09/24: ***  5.  Pt will decrease pain at worst from 6/10 to 2/10 or better to have better sleep and occupational  participation in daily roles. Goal status:01/09/24: ***   ASSESSMENT:  CLINICAL IMPRESSION: 01/09/24: ***  12/22/23: He now has a good home program, he just needs to be consistent with it.  Other current health issues may be negative factors with him doing well with this  Eval: Patient is a 67y.o. male who was seen today for occupational therapy evaluation for bilateral wrist and thumb CMC joint pain.  Right thumb also has history of MCP joint injuries and has some ulnar drift with subluxing EPL.  He does have some orthoses in prefabricated braces, but has not been doing a home exercise program.  He will benefit from outpatient occupational therapy to decrease symptoms and increase quality of life.     PLAN:  OT FREQUENCY: 1-2x/week  OT DURATION: 4 weeks through 01/13/24 and up to 6 visits as needed   PLANNED INTERVENTIONS: 97535 self care/ADL training, 21308 therapeutic exercise, 97530 therapeutic activity, 97112 neuromuscular re-education, 97140 manual therapy, 97760 Orthotic Initial, 97763 Orthotic/Prosthetic subsequent, compression bandaging, Dry needling, energy conservation, coping strategies training, patient/family education, and DME and/or AE instructions  RECOMMENDED OTHER SERVICES: in PT for lower body issues   CONSULTED AND AGREED WITH PLAN OF CARE: Patient  PLAN FOR NEXT SESSION:   ***   Leartis Proud, OTR/L, CHT 01/04/2024, 8:25 AM

## 2024-01-09 ENCOUNTER — Encounter: Admitting: Rehabilitative and Restorative Service Providers"

## 2024-01-09 ENCOUNTER — Ambulatory Visit: Admitting: Sports Medicine

## 2024-01-09 ENCOUNTER — Telehealth: Payer: Self-pay | Admitting: Rehabilitative and Restorative Service Providers"

## 2024-01-09 NOTE — Telephone Encounter (Signed)
 OT called patient to discuss missed appointment. OT spoke with pt who was visiting his daughter and helping her after a sx. He wanted to reschedule for 2 weeks.

## 2024-01-11 ENCOUNTER — Encounter (HOSPITAL_BASED_OUTPATIENT_CLINIC_OR_DEPARTMENT_OTHER)

## 2024-01-16 ENCOUNTER — Ambulatory Visit (INDEPENDENT_AMBULATORY_CARE_PROVIDER_SITE_OTHER): Admitting: Sports Medicine

## 2024-01-16 DIAGNOSIS — M19041 Primary osteoarthritis, right hand: Secondary | ICD-10-CM

## 2024-01-16 NOTE — Progress Notes (Signed)
    Procedures performed today:    None.  Independent interpretation of notes and tests performed by another provider:   None.  Brief History, Exam, Impression, and Recommendations:    Primary osteoarthritis both carpometacarpal joints and right second metacarpophalangeal joint This very pleasant 68 year old male returns, he had a right second MCP injection back in March, we did bilateral first CMC injections end of April. He is doing relatively well with regards to his hand, he does have some additional physical therapy before he is graduated to a home exercise program. He can follow-up with me as needed for this. I am happy to try PRP into his CMC's as well as MCPs if need be. He is currently dealing with an obstructive uropathy type process so he will wait for this to stabilize before we do anything else.    ____________________________________________ Debby PARAS. Curtis, M.D., ABFM., CAQSM., AME. Primary Care and Sports Medicine [facility]  Adjunct Professor of [facility] Medicine  [employer]  Restaurant manager, fast food

## 2024-01-16 NOTE — Assessment & Plan Note (Signed)
 This very pleasant 68 year old male returns, he had a right second MCP injection back in March, we did bilateral first CMC injections end of April. He is doing relatively well with regards to his hand, he does have some additional physical therapy before he is graduated to a home exercise program. He can follow-up with me as needed for this. I am happy to try PRP into his CMC's as well as MCPs if need be. He is currently dealing with an obstructive uropathy type process so he will wait for this to stabilize before we do anything else.

## 2024-01-18 ENCOUNTER — Encounter (HOSPITAL_BASED_OUTPATIENT_CLINIC_OR_DEPARTMENT_OTHER): Admitting: Physical Therapy

## 2024-01-19 NOTE — Therapy (Signed)
 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  His function is better today and his coordination is also better today in the left hand.  Patient states that he was unable to apply himself over the past month due to other health conditions.  He would like to come back in a month to see if he was able to better do his HEP and maintain his body and perhaps meet some of his other long-term goals.  OT request further authorization for an additional month.    PLAN:  OT FREQUENCY: 1 x week   OT DURATION: 6 additional weeks from 01/13/24 - 02/24/24 and 2 additional f/u visits (to cover today and an additional month) up to 4 total visits as needed       See note below for Objective Data and Assessment of Progress/Goals.             END OF SESSION:  OT End of Session - 01/23/24 0844     Visit Number 3    Number of Visits 4    Date for OT Re-Evaluation 02/24/24    Authorization Type Medicare    OT Start Time 0844    OT Stop Time 0927    OT Time Calculation (min) 43 min    Activity Tolerance Patient tolerated treatment well;No increased pain;Patient limited by pain    Behavior During Therapy [facility] for tasks assessed/performed           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HCC)    ongoing evaluation for elevated R>L adosterone secretion   Arthritis    Atrial enlargement, bilateral    Atrial fibrillation (HCC)    paroxysmal   Atrial flutter (HCC)    Complication of anesthesia    could not urinate after UHR, needed cath, mental fog sinc cervical fusion  08-09-16    Dysrhythmia    a-fib-had ablation x2   GERD (gastroesophageal reflux disease)    Hyperaldosteronism (HCC)    Hypertension    Kidney cysts    multiple   Nephrolithiasis    Pneumonia 1990's   Pre-diabetes    Prostatitis    Renal insufficiency    Cr 1.6   Past Surgical History:  Procedure Laterality Date   ABLATION     ATRIAL FIBRILLATION ABLATION N/A 09/28/2012   Procedure: ATRIAL FIBRILLATION ABLATION;  Surgeon: Lynwood Rakers, MD;  Location: [facility] CATH LAB;  Service: Cardiovascular;  Laterality: N/A;   atrial fibrillation ablation with CTI ablation  05/27/11, 09/28/12   ablation for afib and atrial flutter by Dr Rakers [facility]  08/09/2016   C 5 6  C 6 to C 7 cervical fusion   CARDIOVERSION  07/28/2011   Procedure: CARDIOVERSION;  Surgeon: Redell GORMAN Shallow, MD;  Location: [facility] OR;  Service: Cardiovascular;  Laterality: N/A;   COLONOSCOPY WITH PROPOFOL  N/A 03/03/2015   Procedure: COLONOSCOPY WITH PROPOFOL ;  Surgeon: Gladis MARLA Louder, MD;  Location: [facility] ENDOSCOPY;  Service: Gastroenterology;  Laterality: N/A;   double j stent placement  06/2004   left   ESOPHAGOGASTRODUODENOSCOPY (  EGD) WITH PROPOFOL  N/A 03/03/2015   Procedure: ESOPHAGOGASTRODUODENOSCOPY (EGD) WITH PROPOFOL ;  Surgeon: Gladis MARLA Louder, MD;  Location: [facility] ENDOSCOPY;  Service: Gastroenterology;  Laterality: N/A;   ESOPHAGOGASTRODUODENOSCOPY (EGD) WITH PROPOFOL  N/A 10/25/2016   Procedure: ESOPHAGOGASTRODUODENOSCOPY (EGD) WITH PROPOFOL ;  Surgeon: Gladis MARLA Louder, MD;  Location: [facility] ENDOSCOPY;  Service: Endoscopy;  Laterality: N/A;   INGUINAL HERNIA REPAIR     as a child; ? right   INGUINAL HERNIA REPAIR Left 06/23/2016   Procedure: OPEN LEFT INGUINAL HERNIA  REPAIR WITH MESH;  Surgeon: Donnice Gladis, MD;  Location: [facility];  Service: General;  Laterality: Left;   INSERTION OF MESH Left 06/23/2016   Procedure: INSERTION OF MESH;  Surgeon: Donnice Gladis, MD;  Location: [facility];   Service: General;  Laterality: Left;   LEFT HEART CATH AND CORONARY ANGIOGRAPHY N/A 07/25/2018   Procedure: LEFT HEART CATH AND CORONARY ANGIOGRAPHY;  Surgeon: Claudene Victory ORN, MD;  Location: [facility] INVASIVE CV LAB;  Service: Cardiovascular;  Laterality: N/A;   LUNG BIOPSY  1993   NASAL SEPTOPLASTY W/ TURBINOPLASTY  07/2006   prostate biopsy  2011   schwannoma removal  ~01/2010   left thorax   TEE WITHOUT CARDIOVERSION N/A 09/27/2012   Procedure: TRANSESOPHAGEAL ECHOCARDIOGRAM (TEE);  Surgeon: Ezra GORMAN Shuck, MD;  Location: [facility] ENDOSCOPY;  Service: Cardiovascular;  Laterality: N/A;   ULTRASOUND GUIDANCE FOR VASCULAR ACCESS  07/25/2018   Procedure: Ultrasound Guidance For Vascular Access;  Surgeon: Claudene Victory ORN, MD;  Location: [facility] INVASIVE CV LAB;  Service: Cardiovascular;;   UMBILICAL HERNIA REPAIR  07/2006   Patient Active Problem List   Diagnosis Date Noted   Spinal stenosis of lumbar region with neurogenic claudication 11/14/2023   Hoarseness 10/04/2023   Primary osteoarthritis both carpometacarpal joints and right second metacarpophalangeal joint 09/27/2023   Hypercoagulable state due to paroxysmal atrial fibrillation (HCC) 11/10/2022   Asthma, intermittent 11/01/2022   Status post implantation of urinary electronic stimulator device 10/12/2022   Trochanteric bursitis, left hip 02/22/2022   Bilateral impacted cerumen 10/23/2020   Laryngopharyngeal reflux (LPR) 10/23/2020   Vocal cord nodule 10/23/2020   Bilateral elbow joint pain 06/25/2020   Lateral epicondylitis of left elbow 06/25/2020   Moderate COPD (chronic obstructive pulmonary disease) (HCC) 03/26/2020   OSA (obstructive sleep apnea) 03/26/2020   Pulmonary emboli (HCC) 03/14/2019   Chronic respiratory failure with hypoxia (HCC) 08/18/2018   Hyperlipidemia 08/17/2018   CKD (chronic kidney disease), stage II 08/16/2018   Abnormal ECG 07/25/2018   Incontinence of feces 04/18/2018   Urge incontinence of urine 03/11/2018   Renal  insufficiency    Prostatitis    Pre-diabetes    Nephrolithiasis    Kidney cysts    Hyperaldosteronism (HCC)    GERD (gastroesophageal reflux disease)    Dysrhythmia    Complication of anesthesia    Atrial enlargement, bilateral    Arthritis    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HCC)    Inguinal hernia of left side without obstruction or gangrene Nov 2017 06/23/2016   Adjustment disorder with depressed mood 05/11/2016   Atrial fibrillation (HCC) 03/18/2011   Atrial flutter (HCC) 03/18/2011   Hypertension 03/18/2011    ONSET DATE: chronic   REFERRING DIAG: M19.041 (ICD-10-CM) - Primary osteoarthritis, right hand   THERAPY DIAG:  Pain in joint of right hand  Pain in joint of left hand  Muscle weakness (generalized)  Other lack of coordination  Rationale for Evaluation and Treatment: Rehabilitation  PERTINENT HISTORY: Pre PT note:  Pt reports he has bilat hand pain as well as history of degenerative changes at the back. Pt reports MCP joint pain into both hands. Recently had injections into bilat CMC joints.  Pt does have hand weakness into both sides with R slightly worse than L. Pt works with a Systems analyst 2x/wk.    Past cervical fusion  He shows up bit late for eval, states he plays the piano and the bassoon, is very active playing pickle ball, etc.  He has been having pain and problems doing small buttons, gripping objects, using his hand for instruments, etc.  He did bring several braces today including old orthoses and prefabricated braces.  He has questions about pain management.  He states he has no current stretch program for his hands.  He states his wrists also are stiff and hurt.SABRA He is retired Government social research officer.   PRECAUTIONS: None  RED FLAGS: None   WEIGHT BEARING RESTRICTIONS: No   SUBJECTIVE:   SUBJECTIVE STATEMENT: He states his other health conditions are improving somewhat but he has not been able to apply himself to doing therapy exercises very much in  the past month.  He states having low pain now but still some sharp spikes of pain in the past week generally a bit better than before    PAIN:  Are you having pain? Yes: NPRS scale:  0-1/10 at rest now; in past week up to 4-5/10 at worst  Pain location: Bilateral wrists and thumbs Pain description: Aching and sore Aggravating factors: Strong gripping and squeezing Relieving factors: Rest   PATIENT GOALS: To get relief from bilateral hand and thumb and wrist pain  NEXT MD VISIT: As needed   OBJECTIVE: (All objective assessments below are from initial evaluation on: 12/12/23 unless otherwise specified.)   HAND DOMINANCE: Right   ADLs: Overall ADLs: States decreased ability to grab, hold household objects, pain and difficulty to open containers, perform FMS tasks (manipulate fasteners on clothing)   FUNCTIONAL OUTCOME MEASURES: 01/23/24: PSFS: 7.6  Eval: Patient Specific Functional Scale: 6.8 (playing piano, playing bassoon, shaving, opening jars, putting on TED hose)  (Higher Score  =  Better Ability for the Selected Tasks)      UPPER EXTREMITY ROM     Shoulder to Wrist AROM Right eval Left eval Rt / Lt 01/23/24  Forearm supination     Forearm pronation      Wrist flexion 75 71 80  /  72  Wrist extension 69 71 68  /  64  Wrist ulnar deviation     Wrist radial deviation     (Blank rows = not tested)   Hand AROM Right eval Left eval Rt / Lt 01/23/24  Full Fist Ability (or Gap to Distal Palmar Crease) Full Full   Thumb Opposition  (Kapandji Scale)  9/10 9/10   Thumb MCP (0-60) 47 43 44  /  45  Thumb IP (0-80) 58 42 62  /  57  Thumb Radial Abduction Span 48 44  47  /  45  Thumb Palmar Abduction Span      Index MCP (0-90)      Index PIP (0-100)      Index DIP (0-70)       Long MCP (0-90)       Long PIP (0-100)       Long DIP (0-70)       Ring MCP (0-90)       Ring PIP (0-100)  Ring DIP (0-70)       Little MCP (0-90)       Little PIP (0-100)       Little  DIP (0-70)       (Blank rows = not tested)   HAND FUNCTION: 01/23/24:  grip Rt: 63.7#; Lt: 60#    Eval: Observed weakness in affected Lt hand.  Grip strength Right: 70 lbs, Left: 56 lbs   COORDINATION: 01/23/24: 9HPT Lt: 25sec    Eval: Observed coordination impairments with affected Lt hand. 9 Hole Peg Test Right: 24sec, Left: 27 sec (approximately 23 or 24 sec is WFL)    OBSERVATIONS:   01/23/2024: He seems to have some noticeable crossing over the right index finger at the MCP joint some angulation ulnarly now.  He states sometimes it clicks as well.  This was not a significant issue in the past several months previously.   Eval: Right thumb MCP joint has EPL subluxation and ulnar drift apparently old injuries.  CMC joints are both collapsing somewhat and wrists are both grinding with some crepitus felt in left wrist is stiffer than right wrist.  He also has positive test of intrinsic tightness   TODAY'S TREATMENT:  01/23/24: Pt performs AROM, gripping, and strength with bilateral hands/wrists against therapist's resistance for exercise/activities as well as new measures today. OT also discusses home and functional tasks with the pt and reviews goals.  OT educates on crossing over at the index finger of the right hand and adapting exercises to help target that and correct that.  He was urged to use tools at home rather than struggle with a jar, etc.  We review the ideas of joint protection and activity modification as well as getting a pair of compression gloves or supportive gloves as he has not done this yet.  Using the complied data, OT also reviews all home exercises. Pt states understanding and the desire to perform these himself for an additional month and then come in and follow-up with OT.  Exercises performed/reviewed today: - Seated Wrist Flexion Stretch  - 3 x daily - 3 reps - 15 hold - Wrist Prayer Stretch  - 3 x daily - 3 reps - 15 sec hold - Stretch thumb toward base of  small finger (put hand in LAP)  - 2-3 x daily - 3-5 reps - 15 sec hold - Thumb PROM Composite Extension  - 2-3 x daily - 3 reps - 15 sec hold - Thumb Webspace Stretch  - 3 x daily - 3 reps - 15 sec hold - Towel Roll Grip with Forearm in Neutral  - 3 x daily - 5 reps - 10 sec hold - BACK KNUCKLE STRETCHES   - 4 x daily - 3-5 reps - 15 sec hold - HOOK Stretch  - 4 x daily - 3-5 reps - 15-20 sec hold  PATIENT EDUCATION: Education details: See tx section above for details  Person educated: Patient Education method: Engineer, structural, Teach back, Handouts  Education comprehension: States and demonstrates understanding, Additional Education required    HOME EXERCISE PROGRAM: Access Code: [email or web address] URL: [email or web address] Date: 12/12/2023 Prepared by: Melvenia Ada    GOALS: Goals reviewed with patient? Yes   SHORT TERM GOALS: (STG required if POC>30 days) Target Date: 12/23/2023  Pt will demo/state understanding of initial HEP to improve pain levels and prerequisite motion. Goal status: INITIAL   LONG TERM GOALS: Target Date: 02/24/2024  Pt will improve functional ability by decreased impairment per PSFS assessment  from 6.8 to 8 or better, for better quality of life. Goal status: 01/23/24: improved to 7.6 now  2.  Pt will improve grip strength in left hand from 56 lbs to at least 60 to lbs for functional use at home and in IADLs. Goal status:01/23/24: MET  3.  Pt will improve A/ROM in right wrist extension from 69 to at least 74, to have functional motion for tasks like reach and grasp.  Goal status:01/23/24: not met- asked to get back to wrist stretches  4.  Pt will improve strength in TBD (pending baseline measures in the next 1-2 sessions) to have increased functional ability to carry out selfcare and higher-level homecare tasks with less difficulty. Goal status:01/23/24: Will discharge to school today as it has not been updated (PT was not here for follow-ups  due to health issues), and other long-term goals incorporate this  5.  Pt will decrease pain at worst from 6/10 to 2/10 or better to have better sleep and occupational participation in daily roles. Goal status:01/23/24: Improved to 4-5/10 at worst in the past week   ASSESSMENT:  CLINICAL IMPRESSION: 01/23/24: He has made progress towards long-term goals and met his grip strengthening goal.  His function is better today and his coordination is also better today in the left hand.  Patient states that he was unable to apply himself over the past month due to other health conditions.  He would like to come back in a month to see if he was able to better do his HEP and maintain his body and perhaps meet some of his other long-term goals.  OT request further authorization for an additional month.     PLAN:  OT FREQUENCY: 1 x week   OT DURATION: 6 additional weeks from 01/13/24 - 02/24/24 and 2 additional f/u visits (to cover today and an additional month) up to 4 total visits as needed   PLANNED INTERVENTIONS: 97535 self care/ADL training, 02889 therapeutic exercise, 97530 therapeutic activity, 97112 neuromuscular re-education, 97140 manual therapy, 97760 Orthotic Initial, 97763 Orthotic/Prosthetic subsequent, compression bandaging, Dry needling, energy conservation, coping strategies training, patient/family education, and DME and/or AE instructions  RECOMMENDED OTHER SERVICES: in PT for lower body issues   CONSULTED AND AGREED WITH PLAN OF CARE: Patient  PLAN FOR NEXT SESSION:   Follow-up in a month to see if he has been able to manage his hand pain and issues better, check crossing over of the index finger of the right hand.   Melvenia Ada, OTR/L, CHT 01/23/2024, 9:36 AM

## 2024-01-23 ENCOUNTER — Encounter: Payer: Self-pay | Admitting: Rehabilitative and Restorative Service Providers"

## 2024-01-23 ENCOUNTER — Ambulatory Visit (INDEPENDENT_AMBULATORY_CARE_PROVIDER_SITE_OTHER): Admitting: Rehabilitative and Restorative Service Providers"

## 2024-01-23 DIAGNOSIS — M25542 Pain in joints of left hand: Secondary | ICD-10-CM | POA: Diagnosis not present

## 2024-01-23 DIAGNOSIS — M25541 Pain in joints of right hand: Secondary | ICD-10-CM | POA: Diagnosis not present

## 2024-01-23 DIAGNOSIS — M6281 Muscle weakness (generalized): Secondary | ICD-10-CM | POA: Diagnosis not present

## 2024-01-23 DIAGNOSIS — R278 Other lack of coordination: Secondary | ICD-10-CM

## 2024-02-08 ENCOUNTER — Ambulatory Visit: Admitting: Pulmonary Disease

## 2024-02-08 DIAGNOSIS — J4530 Mild persistent asthma, uncomplicated: Secondary | ICD-10-CM | POA: Diagnosis not present

## 2024-02-08 NOTE — Progress Notes (Signed)
Spirometry pre/post performed today. 

## 2024-02-08 NOTE — Patient Instructions (Signed)
Spirometry pre/post performed today. 

## 2024-02-09 ENCOUNTER — Ambulatory Visit: Payer: Self-pay | Admitting: Pulmonary Disease

## 2024-02-09 NOTE — Progress Notes (Signed)
 Pt.notified

## 2024-02-10 LAB — PULMONARY FUNCTION TEST
FEF 25-75 Post: 1.94 L/s
FEF 25-75 Pre: 1.77 L/s
FEF2575-%Change-Post: 9 %
FEF2575-%Pred-Post: 66 %
FEF2575-%Pred-Pre: 61 %
FEV1-%Change-Post: 3 %
FEV1-%Pred-Post: 74 %
FEV1-%Pred-Pre: 71 %
FEV1-Post: 2.84 L
FEV1-Pre: 2.74 L
FEV1FVC-%Change-Post: 2 %
FEV1FVC-%Pred-Pre: 96 %
FEV6-%Change-Post: 0 %
FEV6-%Pred-Post: 78 %
FEV6-%Pred-Pre: 78 %
FEV6-Post: 3.86 L
FEV6-Pre: 3.85 L
FEV6FVC-%Change-Post: 0 %
FEV6FVC-%Pred-Post: 104 %
FEV6FVC-%Pred-Pre: 105 %
FVC-%Change-Post: 0 %
FVC-%Pred-Post: 75 %
FVC-%Pred-Pre: 74 %
FVC-Post: 3.89 L
FVC-Pre: 3.86 L
Post FEV1/FVC ratio: 73 %
Post FEV6/FVC ratio: 99 %
Pre FEV1/FVC ratio: 71 %
Pre FEV6/FVC Ratio: 100 %

## 2024-02-16 NOTE — Therapy (Signed)
 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  He seems to have grown slack on some of the stretches and still has some lingering tightness, he has also been much more active lately and playing instruments more and more repetitiously.  He was cautioned about that, and strongly advised to get a pair of compression gloves during these activities.  He has met most of his long-term goals now but should be cautious with his stiff wrists especially if he goes back to playing tennis.  We agreed to discharge today as there are no new things to teach him and he is managing well.  He can return in the future with a new order if he has any exacerbations.   Education / Equipment: Pt has all needed materials and education. Pt understands how to continue on with self-management. See tx notes for more details.   Patient agrees to discharge due to max benefits received from outpatient occupational therapy / hand therapy at this time.   Melvenia Ada, OTR/L, CHT 02/20/24               END OF SESSION:  OT End of Session - 02/20/24 0859     Visit Number 4    Number of Visits 4    Date for OT Re-Evaluation 02/24/24    Authorization Type Medicare    OT Start Time 0856    OT Stop Time 0926    OT Time Calculation (min) 30 min    Equipment Utilized During Treatment therapy ball    Activity Tolerance Patient tolerated treatment well;No increased pain    Behavior During Therapy WFL for tasks assessed/performed            Past Medical  History:  Diagnosis Date   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HCC)    ongoing evaluation for elevated R>L adosterone secretion   Arthritis    Atrial enlargement, bilateral    Atrial fibrillation (HCC)    paroxysmal   Atrial flutter (HCC)    Complication of anesthesia    could not urinate after UHR, needed cath, mental fog sinc cervical fusion 08-09-16    Dysrhythmia    a-fib-had ablation x2   GERD (gastroesophageal reflux disease)    Hyperaldosteronism (HCC)    Hypertension    Kidney cysts    multiple   Nephrolithiasis    Pneumonia 1990's   Pre-diabetes    Prostatitis    Renal insufficiency    Cr 1.6   Past Surgical History:  Procedure Laterality Date   ABLATION     ATRIAL FIBRILLATION ABLATION N/A 09/28/2012   Procedure: ATRIAL FIBRILLATION ABLATION;  Surgeon: Lynwood Rakers, MD;  Location: [facility] CATH LAB;  Service: Cardiovascular;  Laterality: N/A;   atrial fibrillation ablation with CTI ablation  05/27/11, 09/28/12   ablation for afib and atrial flutter by Dr Rakers [facility]  08/09/2016   C 5 6  C 6 to C 7 cervical fusion   CARDIOVERSION  07/28/2011   Procedure: CARDIOVERSION;  Surgeon: Redell GORMAN Shallow, MD;  Location: [facility] OR;  Service: Cardiovascular;  Laterality: N/A;   COLONOSCOPY WITH PROPOFOL  N/A 03/03/2015   Procedure: COLONOSCOPY WITH PROPOFOL ;  Surgeon: Gladis MARLA Louder, MD;  Location: [facility] ENDOSCOPY;  Service: Gastroenterology;  Laterality: N/A;   double j stent placement  06/2004   left   ESOPHAGOGASTRODUODENOSCOPY (EGD) WITH PROPOFOL  N/A 03/03/2015   Procedure: ESOPHAGOGASTRODUODENOSCOPY (EGD) WITH PROPOFOL ;  Surgeon: Gladis MARLA Louder, MD;  Location: [facility] ENDOSCOPY;  Service: Gastroenterology;  Laterality: N/A;   ESOPHAGOGASTRODUODENOSCOPY (EGD) WITH PROPOFOL  N/A 10/25/2016   Procedure: ESOPHAGOGASTRODUODENOSCOPY (EGD) WITH PROPOFOL ;  Surgeon: Gladis MARLA Louder, MD;  Location: [facility] ENDOSCOPY;  Service: Endoscopy;  Laterality: N/A;   INGUINAL HERNIA REPAIR     as a child; ?  right   INGUINAL HERNIA REPAIR Left 06/23/2016   Procedure: OPEN LEFT INGUINAL HERNIA  REPAIR WITH MESH;  Surgeon: Donnice Gladis, MD;  Location: [facility];  Service: General;  Laterality: Left;   INSERTION OF MESH Left 06/23/2016   Procedure: INSERTION OF MESH;  Surgeon: Donnice Gladis, MD;  Location:  SURGERY CENTER;  Service: General;  Laterality: Left;   LEFT HEART CATH AND CORONARY ANGIOGRAPHY N/A 07/25/2018   Procedure: LEFT HEART CATH AND CORONARY ANGIOGRAPHY;  Surgeon: Claudene Victory ORN, MD;  Location: [facility] INVASIVE CV LAB;  Service: Cardiovascular;  Laterality: N/A;   LUNG BIOPSY  1993   NASAL SEPTOPLASTY W/ TURBINOPLASTY  07/2006   prostate biopsy  2011   schwannoma removal  ~01/2010   left thorax   TEE WITHOUT CARDIOVERSION N/A 09/27/2012   Procedure: TRANSESOPHAGEAL ECHOCARDIOGRAM (TEE);  Surgeon: Ezra GORMAN Shuck, MD;  Location[facility] ENDOSCOPY;  Service: Cardiovascular;  Laterality: N/A;   ULTRASOUND GUIDANCE FOR VASCULAR ACCESS  07/25/2018   Procedure: Ultrasound Guidance For Vascular Access;  Surgeon: Claudene Victory ORN, MD;  Location: [facility] INVASIVE CV LAB;  Service: Cardiovascular;;   UMBILICAL HERNIA REPAIR  07/2006   Patient Active Problem List   Diagnosis Date Noted   Spinal stenosis of lumbar region with neurogenic claudication 11/14/2023   Hoarseness 10/04/2023   Primary osteoarthritis both carpometacarpal joints and right second metacarpophalangeal joint 09/27/2023   Hypercoagulable state due to paroxysmal atrial fibrillation (HCC) 11/10/2022   Asthma, intermittent 11/01/2022   Status post implantation of urinary electronic stimulator device 10/12/2022   Trochanteric bursitis, left hip 02/22/2022   Bilateral impacted cerumen 10/23/2020   Laryngopharyngeal reflux (LPR) 10/23/2020   Vocal cord nodule 10/23/2020   Bilateral elbow joint pain 06/25/2020   Lateral epicondylitis of left elbow 06/25/2020   Moderate COPD (chronic obstructive pulmonary disease)  (HCC) 03/26/2020   OSA (obstructive sleep apnea) 03/26/2020   Pulmonary emboli (HCC) 03/14/2019   Chronic respiratory failure with hypoxia (HCC) 08/18/2018   Hyperlipidemia 08/17/2018   CKD (chronic kidney disease), stage II 08/16/2018   Abnormal ECG 07/25/2018   Incontinence of feces 04/18/2018   Urge incontinence of urine 03/11/2018   Renal insufficiency    Prostatitis    Pre-diabetes    Nephrolithiasis    Kidney cysts    Hyperaldosteronism (HCC)    GERD (gastroesophageal reflux disease)    Dysrhythmia    Complication of anesthesia    Atrial enlargement, bilateral    Arthritis    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HCC)    Inguinal hernia of left side without obstruction or gangrene Nov 2017 06/23/2016   Adjustment disorder with depressed mood 05/11/2016   Atrial fibrillation (HCC) 03/18/2011   Atrial flutter (HCC) 03/18/2011   Hypertension 03/18/2011    ONSET DATE:  chronic   REFERRING DIAG: M19.041 (ICD-10-CM) - Primary osteoarthritis, right hand   THERAPY DIAG:  Pain in joint of right hand  Other lack of coordination  Pain in joint of left hand  Muscle weakness (generalized)  Rationale for Evaluation and Treatment: Rehabilitation  PERTINENT HISTORY: Pre PT note: Pt reports he has bilat hand pain as well as history of degenerative changes at the back. Pt reports MCP joint pain into both hands. Recently had injections into bilat CMC joints.  Pt does have hand weakness into both sides with R slightly worse than L. Pt works with a Systems analyst 2x/wk.    Past cervical fusion  He shows up bit late for eval, states he plays the piano and the bassoon, is very active playing pickle ball, etc.  He has been having pain and problems doing small buttons, gripping objects, using his hand for instruments, etc.  He did bring several braces today including old orthoses and prefabricated braces.  He has questions about pain management.  He states he has no current stretch program for  his hands.  He states his wrists also are stiff and hurt.SABRA He is retired Government social research officer.   PRECAUTIONS: None  RED FLAGS: None   WEIGHT BEARING RESTRICTIONS: No   SUBJECTIVE:   SUBJECTIVE STATEMENT: He states not having significant pain recently but he is having some crepitus feeling in the right hand second MCP joint.  He states playing the bassoon a lot recently, and he is going on a vacation starting this week.    PAIN:  Are you having pain? Yes: NPRS scale:   0-1/10 at rest now; in past week up to 2-3/10 at worst  Pain location: Bilateral wrists and thumbs Pain description: Aching and sore Aggravating factors: Strong gripping and squeezing Relieving factors: Rest   PATIENT GOALS: To get relief from bilateral hand and thumb and wrist pain  NEXT MD VISIT: As needed   OBJECTIVE: (All objective assessments below are from initial evaluation on: 12/12/23 unless otherwise specified.)   HAND DOMINANCE: Right   ADLs: Overall ADLs: States decreased ability to grab, hold household objects, pain and difficulty to open containers, perform FMS tasks (manipulate fasteners on clothing)   FUNCTIONAL OUTCOME MEASURES: 02/20/24: PSFS: 8.1   01/23/24: PSFS: 7.6  Eval: Patient Specific Functional Scale: 6.8 (playing piano, playing bassoon, shaving, opening jars, putting on TED hose)  (Higher Score  =  Better Ability for the Selected Tasks)      UPPER EXTREMITY ROM     Shoulder to Wrist AROM Right eval Left eval Rt / Lt 01/23/24 Rt / Lt 02/20/24  Forearm supination      Forearm pronation       Wrist flexion 75 71 80  /  72 74  / 72  Wrist extension 69 71 68  /  64 56  / 59  Wrist ulnar deviation      Wrist radial deviation      (Blank rows = not tested)   Hand AROM Right eval Left eval Rt / Lt 01/23/24 Rt 02/20/24  Full Fist Ability (or Gap to Distal Palmar Crease) Full Full  full  Thumb Opposition  (Kapandji Scale)  9/10 9/10  9/10  Thumb MCP (0-60) 47 43 44  /  45 46     Thumb IP (0-80) 58 42 62  /  57 46    Thumb Radial Abduction Span 48 44  47  /  45 44    Thumb  Palmar Abduction Span       Index MCP (0-90)       Index PIP (0-100)       Index DIP (0-70)        Long MCP (0-90)        Long PIP (0-100)        Long DIP (0-70)        Ring MCP (0-90)        Ring PIP (0-100)        Ring DIP (0-70)        Little MCP (0-90)        Little PIP (0-100)        Little DIP (0-70)        (Blank rows = not tested)   HAND FUNCTION: 02/20/24: Grip: Rt: 61.8#, LT: 69#   01/23/24:  grip Rt: 63.7#; Lt: 60#    Eval: Observed weakness in affected Lt hand.  Grip strength Right: 70 lbs, Left: 56 lbs   COORDINATION: 01/23/24: 9HPT Lt: 25sec    Eval: Observed coordination impairments with affected Lt hand. 9 Hole Peg Test Right: 24sec, Left: 27 sec (approximately 23 or 24 sec is WFL)    OBSERVATIONS:   01/23/2024: He seems to have some noticeable crossing over the right index finger at the MCP joint some angulation ulnarly now.  He states sometimes it clicks as well.  This was not a significant issue in the past several months previously.   Eval: Right thumb MCP joint has EPL subluxation and ulnar drift apparently old injuries.  CMC joints are both collapsing somewhat and wrists are both grinding with some crepitus felt in left wrist is stiffer than right wrist.  He also has positive test of intrinsic tightness   TODAY'S TREATMENT:  02/20/24: We do a progress note to see how he has fared over the past month of self-management.  This includes gripping activities active range of motion check strength check, review of goals, review of functional performance, home exercises and recommendations.  He still has not gotten any compression gloves, and he is recommended highly to get these when playing bassoon.  He is shown several options.  His wrist was a bit tighter today so prayer stretches or wrist extension was emphasized.  To help manage the crepitus in his MCP joint of  his index finger-OT advises that this could be a sign of EDC/sagittal band issues in the extensor hood for which he should manage with the stretches that he knows, avoidance of grinding or popping or crepitus.  He should also limit time on certain tasks.  We review isometric long gripping to maintain grip strength and his grip strength is significantly better on the left hand now.  He was given a therapy ball to help with this.  If his index finger ever is locking this could be sign of trigger finger or EDC subluxation for which he could benefit from custom bracing-so in that case he should get a new referral and return to therapy for new treatment.  Right now it is not needed as he is not doing this repetitiously or consistently.    He states understanding his full exercise program, understanding management, understanding that if he stops stretching and managing rigidity in his body, he will get more rigid which will add tightness and pain to his joints, and make life more difficult.   He states understanding all these directions and is happy with discharging today though he can return with a  new order in the future if symptoms worsen.    Exercises performed/reviewed today: - Seated Wrist Flexion Stretch  - 3 x daily - 3 reps - 15 hold - Wrist Prayer Stretch  - 3 x daily - 3 reps - 15 sec hold - Stretch thumb toward base of small finger (put hand in LAP)  - 2-3 x daily - 3-5 reps - 15 sec hold - Thumb PROM Composite Extension  - 2-3 x daily - 3 reps - 15 sec hold - Thumb Webspace Stretch  - 3 x daily - 3 reps - 15 sec hold - Towel Roll Grip with Forearm in Neutral  - 3 x daily - 5 reps - 10 sec hold - BACK KNUCKLE STRETCHES   - 4 x daily - 3-5 reps - 15 sec hold - HOOK Stretch  - 4 x daily - 3-5 reps - 15-20 sec hold  PATIENT EDUCATION: Education details: See tx section above for details  Person educated: Patient Education method: Verbal Instruction, Teach back, Handouts  Education  comprehension: States and demonstrates understanding   HOME EXERCISE PROGRAM: Access Code: [email or web address] URL: [email or web address] Date: 12/12/2023 Prepared by: Melvenia Ada    GOALS: Goals reviewed with patient? Yes   SHORT TERM GOALS: (STG required if POC>30 days) Target Date: 12/23/2023  Pt will demo/state understanding of initial HEP to improve pain levels and prerequisite motion. Goal status: 01/23/24: MET   LONG TERM GOALS: Target Date: 02/24/2024  Pt will improve functional ability by decreased impairment per PSFS assessment from 6.8 to 8 or better, for better quality of life. Goal status: 02/20/24: MET  01/23/24: improved to 7.6 now  2.  Pt will improve grip strength in left hand from 56 lbs to at least 60 to lbs for functional use at home and in IADLs. Goal status:01/23/24: MET  3.  Pt will improve A/ROM in right wrist extension from 69 to at least 74, to have functional motion for tasks like reach and grasp.  Goal status:02/20/24: NOT met, advised to use caution and stretch this way every day, multiple times to prevent issues   01/23/24: not met- asked to get back to wrist stretches  4.  Pt will improve strength in TBD (pending baseline measures in the next 1-2 sessions) to have increased functional ability to carry out selfcare and higher-level homecare tasks with less difficulty. Goal status:01/23/24: Will discharge to school today as it has not been updated (PT was not here for follow-ups due to health issues), and other long-term goals incorporate this  5.  Pt will decrease pain at worst from 6/10 to 2/10 or better to have better sleep and occupational participation in daily roles. Goal status: 02/20/24: considered MET- now 2-3/10 in past week at worst   01/23/24: Improved to 4-5/10 at worst in the past week   ASSESSMENT:  CLINICAL IMPRESSION: 02/20/24: He has managed fairly well over the past month without any formal therapy sessions.  He seems to have  grown slack on some of the stretches and still has some lingering tightness, he has also been much more active lately and playing instruments more and more repetitiously.  He was cautioned about that, and strongly advised to get a pair of compression gloves during these activities.  He has met most of his long-term goals now but should be cautious with his stiff wrists especially if he goes back to playing tennis.  We agreed to discharge today as there are no new things to teach him and  he is managing well.  He can return in the future with a new order if he has any exacerbations.       PLAN:  OT FREQUENCY: Discharge  OT DURATION: Discharge  PLANNED INTERVENTIONS: 97535 self care/ADL training, 02889 therapeutic exercise, 97530 therapeutic activity, 97112 neuromuscular re-education, 97140 manual therapy, 97760 Orthotic Initial, [phone] Orthotic/Prosthetic subsequent, compression bandaging, Dry needling, energy conservation, coping strategies training, patient/family education, and DME and/or AE instructions  RECOMMENDED OTHER SERVICES: in PT for lower body issues   CONSULTED AND AGREED WITH PLAN OF CARE: Patient  PLAN FOR NEXT SESSION:   N/A/discharge   Melvenia Ada, OTR/L, CHT 02/20/2024, 9:35 AM

## 2024-02-20 ENCOUNTER — Ambulatory Visit (INDEPENDENT_AMBULATORY_CARE_PROVIDER_SITE_OTHER): Admitting: Rehabilitative and Restorative Service Providers"

## 2024-02-20 ENCOUNTER — Encounter: Payer: Self-pay | Admitting: Rehabilitative and Restorative Service Providers"

## 2024-02-20 DIAGNOSIS — M25542 Pain in joints of left hand: Secondary | ICD-10-CM | POA: Diagnosis not present

## 2024-02-20 DIAGNOSIS — R278 Other lack of coordination: Secondary | ICD-10-CM | POA: Diagnosis not present

## 2024-02-20 DIAGNOSIS — M25541 Pain in joints of right hand: Secondary | ICD-10-CM | POA: Diagnosis not present

## 2024-02-20 DIAGNOSIS — M6281 Muscle weakness (generalized): Secondary | ICD-10-CM

## 2024-03-12 ENCOUNTER — Ambulatory Visit (HOSPITAL_BASED_OUTPATIENT_CLINIC_OR_DEPARTMENT_OTHER): Admitting: Physical Therapy

## 2024-03-27 ENCOUNTER — Encounter: Payer: Self-pay | Admitting: Sports Medicine

## 2024-04-07 ENCOUNTER — Other Ambulatory Visit: Payer: Self-pay | Admitting: Cardiology

## 2024-04-30 ENCOUNTER — Telehealth: Payer: Self-pay | Admitting: Neurology

## 2024-04-30 NOTE — Telephone Encounter (Signed)
 LVM and sent mychart msg informing pt of need to reschedule 05/17/24 appt - MD departure

## 2024-05-17 ENCOUNTER — Ambulatory Visit: Payer: Medicare Other | Admitting: Neurology

## 2024-05-28 ENCOUNTER — Encounter: Payer: Self-pay | Admitting: Radiology

## 2024-06-05 ENCOUNTER — Ambulatory Visit (HOSPITAL_BASED_OUTPATIENT_CLINIC_OR_DEPARTMENT_OTHER): Admitting: Physical Therapy

## 2024-06-20 ENCOUNTER — Ambulatory Visit: Admitting: Cardiology

## 2024-07-01 NOTE — Progress Notes (Unsigned)
  Electrophysiology Office Follow up Visit Note:    Date:  07/03/2024   ID:  Casey ONEIDA Finer, Casey [name], DOB [birth year], MRN [id number]  PCP:  Elliot Charm, Casey [name]  [facility] Cardiologist:  None  [facility] Electrophysiologist:  OLE ONEIDA HOLTS, Casey [name]    Interval History:     Casey ONEIDA Finer, Casey [name] is a 68 y.o. male who presents for a follow up visit.   I last saw the patient March 23, 2023.  The patient has a history of atrial fibrillation and flutter with 2 prior catheter ablations.  At the last appointment with me he was doing well with good rhythm control.  He is doing well today.  No problems with his heart rhythm.      Past medical, surgical, social and family history were reviewed.  ROS:   Please see the history of present illness.    All other systems reviewed and are negative.  EKGs/Labs/Other Studies Reviewed:    The following studies were reviewed today:          Physical Exam:    VS:  There were no vitals taken for this visit.    Wt Readings from Last 3 Encounters:  12/15/23 221 lb (100.2 kg)  05/18/23 235 lb (106.6 kg)  03/23/23 232 lb 2 oz (105.3 kg)     GEN: no distress CARD: RRR, No MRG RESP: No IWOB. CTAB.      ASSESSMENT:    1. Primary hypertension   2. Persistent atrial fibrillation (HCC)    PLAN:    In order of problems listed above:  #Persistent atrial fibrillation and flutter On Eliquis  for stroke prophylaxis Continue diltiazem   #Hypertension At goal today.  Recommend checking blood pressures 1-2 times per week at home and recording the values.  Recommend bringing these recordings to the primary care physician.  I discussed my upcoming departure from [facility] during today's clinic appointment.  The patient will continue to follow-up with one of my EP partners moving forward.  Follow-up with EP as needed moving forward. Follow-up with Dr. Mona in general cardiology in 1 year.   Signed, Ole Holts, Casey [name], [facility],  [facility] 07/03/2024 2:03 PM    Electrophysiology [facility]

## 2024-07-03 ENCOUNTER — Ambulatory Visit: Attending: Cardiology | Admitting: Cardiology

## 2024-07-03 ENCOUNTER — Encounter: Payer: Self-pay | Admitting: Cardiology

## 2024-07-03 VITALS — BP 118/78 | HR 84 | Ht 72.5 in | Wt 221.8 lb

## 2024-07-03 DIAGNOSIS — I10 Essential (primary) hypertension: Secondary | ICD-10-CM

## 2024-07-03 DIAGNOSIS — I4819 Other persistent atrial fibrillation: Secondary | ICD-10-CM

## 2024-07-05 ENCOUNTER — Ambulatory Visit: Admitting: Cardiology

## 2024-07-06 ENCOUNTER — Other Ambulatory Visit: Payer: Self-pay | Admitting: Cardiology

## 2024-07-09 MED ORDER — EPLERENONE 50 MG PO TABS: 50.0000 mg | ORAL_TABLET | Freq: Two times a day (BID) | ORAL | 3 refills | Status: AC

## 2024-07-31 ENCOUNTER — Encounter: Payer: Self-pay | Admitting: Diagnostic Neuroimaging

## 2024-07-31 ENCOUNTER — Ambulatory Visit: Admitting: Diagnostic Neuroimaging

## 2024-07-31 VITALS — BP 132/78 | HR 84 | Ht 72.5 in | Wt 244.4 lb

## 2024-07-31 DIAGNOSIS — R413 Other amnesia: Secondary | ICD-10-CM

## 2024-07-31 DIAGNOSIS — G629 Polyneuropathy, unspecified: Secondary | ICD-10-CM | POA: Diagnosis not present

## 2024-07-31 NOTE — Progress Notes (Signed)
 "  [facility]  PATIENT: Marlyce ONEIDA Finer, MD DOB: 1955-12-13  REFERRING CLINICIAN: Elliot Charm, MD HISTORY FROM: patient  REASON FOR VISIT: follow up   HISTORICAL  CHIEF COMPLAINT:  Chief Complaint  Patient presents with   RM 7     Patient is here alone for polyneuropathy and memory concerns - has tingling in toes has not changed over the years. His wife thinks he doesn't pay attention to details.  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 Since last visit, doing well.  Patient feels stable with his memory and cognitive abilities.  His wife still thinks that he does not pay attention as well as he used to.  He gives an example of trying to book a flight, ending up on a different website then he thought he was on and starting to enter his critical information.  When he realized what happened he canceled the transaction and ended up canceling his credit card just as a precaution.  Otherwise he is able to maintain all of his ADLs.  Still has some stable tingling sensation in the toes.   REVIEW OF SYSTEMS: Full 14 system review of systems performed and negative with exception of: as per HPI.  ALLERGIES: Allergies[1]  HOME MEDICATIONS: Outpatient Medications Prior to Visit  Medication Sig Dispense Refill   allopurinol  (ZYLOPRIM ) 100 MG tablet Take 100 mg by mouth 2 (two) times daily.     ammonium lactate  (LAC-HYDRIN ) 12 % lotion Apply 1 Application topically as needed for dry skin. 400 g 5   apixaban  (ELIQUIS ) 5 MG TABS tablet Take 1 tablet (5 mg total) by mouth 2 (two) times daily. 180 tablet 3   Ascorbic Acid  (VITAMIN C ) 1000 MG tablet Take 1,000 mg by mouth 3 (three) times a week.     atorvastatin  (LIPITOR) 20 MG tablet Take 1 tablet (20 mg total) by mouth daily. 90 tablet 3   BREZTRI  AEROSPHERE 160-9-4.8 MCG/ACT AERO SMARTSIG:2 Puff(s) By  Mouth Twice Daily     Cholecalciferol (VITAMIN D -3 PO) Take by mouth. (Patient not taking: Reported on 08/02/2024)     diltiazem  (CARDIZEM  CD) 360 MG 24 hr capsule Take 360 mg by mouth every evening.  7   eplerenone  (INSPRA ) 50 MG tablet Take 1 tablet (50 mg total) by mouth 2 (two) times daily. 180 tablet 3   FIBER PO Taking 2 gummy by mouth daily     finasteride (PROSCAR) 5 MG tablet Take 5 mg by mouth daily.     Glucosamine 500 MG CAPS  (Patient taking differently: as needed.)     hydrochlorothiazide  (MICROZIDE ) 12.5 MG capsule Take 12.5 mg by mouth daily.     losartan  (COZAAR ) 100 MG tablet Take 100 mg by mouth daily.     metFORMIN (GLUCOPHAGE) 500 MG tablet Take 500 mg by mouth every morning.     montelukast  (SINGULAIR ) 10 MG tablet TAKE 1 TABLET(10 MG) BY MOUTH AT BEDTIME 30 tablet 11   Multiple Vitamin (MULTIVITAMIN) capsule Take 1 capsule by mouth 3 (three) times a week.     Multiple Vitamins-Minerals (ZINC PO) Take 1 tablet by mouth as needed. (Patient not taking: Reported on 08/01/2024)     omeprazole (PRILOSEC) 40 MG capsule Take 40 mg by mouth daily before supper.      potassium chloride  SA (KLOR-CON  M) 20 MEQ tablet TAKE 2 TABLETS BY MOUTH TWICE  DAILY TODAY(AT LEAST 2 HOURS APART) FOR A TOTAL OF 80 MEQ AND THEN START 1 TABLET(20 MEQ) ONCE DAILY THEREAFTER 90 tablet 2   PROAIR  HFA 108 (90 Base) MCG/ACT inhaler SMARTSIG:2 Inhalation Via Inhaler Every 6 Hours PRN (Patient taking differently: as needed. As needed)     scopolamine  (TRANSDERM-SCOP) 1 MG/3DAYS Place 1 patch onto the skin as needed.     sertraline  (ZOLOFT ) 100 MG tablet Take 100 mg by mouth daily. (Patient not taking: Reported on 08/02/2024)     tamsulosin (FLOMAX) 0.4 MG CAPS capsule Take 0.4 mg by mouth every morning.     TRULICITY 0.75 MG/0.5ML SOAJ Inject 0.75 mg into the skin once a week.     vitamin B-12 (CYANOCOBALAMIN ) 1000 MCG tablet Take 1,000 mcg by mouth daily.     XIFAXAN 550 MG TABS tablet Take 550 mg by mouth 3  (three) times daily.     CIALIS 20 MG tablet Take 20 mg by mouth daily as needed for erectile dysfunction.  11   ciprofloxacin (CIPRO) 500 MG tablet Take 500 mg by mouth 2 (two) times daily. (Patient not taking: Reported on 08/02/2024)     metoprolol  tartrate (LOPRESSOR ) 25 MG tablet Take 1 tablet by mouth every 8 hours as needed for afib HR over 100 (Patient taking differently: as needed. Take 1 tablet by mouth every 8 hours as needed for afib HR over 100) 30 tablet 1   MYRBETRIQ 50 MG TB24 tablet Take 50 mg by mouth daily. (Patient not taking: Reported on 08/01/2024)     XIGDUO XR 11-998 MG TB24 Take 1 tablet by mouth daily. (Patient not taking: Reported on 08/02/2024)     No facility-administered medications prior to visit.    PAST MEDICAL HISTORY: Past Medical History:  Diagnosis Date   Adrenal cortical hyperfunction    ongoing evaluation for elevated R>L adosterone secretion   Arthritis    Atrial enlargement, bilateral    Atrial fibrillation (HCC)    paroxysmal   Atrial flutter (HCC)    Complication of anesthesia    could not urinate after UHR, needed cath, mental fog sinc cervical fusion 08-09-16    Dysrhythmia    a-fib-had ablation x2   GERD (gastroesophageal reflux disease)    Hyperaldosteronism    Hypertension    Kidney cysts    multiple   Nephrolithiasis    Pneumonia 1990's   Pre-diabetes    Prostatitis    Renal insufficiency    Cr 1.6    PAST SURGICAL HISTORY: Past Surgical History:  Procedure Laterality Date   ABLATION     ATRIAL FIBRILLATION ABLATION N/A 09/28/2012   Procedure: ATRIAL FIBRILLATION ABLATION;  Surgeon: Lynwood Rakers, MD;  Location: [facility] CATH LAB;  Service: Cardiovascular;  Laterality: N/A;   atrial fibrillation ablation with CTI ablation  05/27/11, 09/28/12   ablation for afib and atrial flutter by Dr Rakers [facility]  08/09/2016   C 5 6  C 6 to C 7 cervical fusion   CARDIOVERSION  07/28/2011   Procedure: CARDIOVERSION;  Surgeon: Redell GORMAN Shallow,  MD;  Location: [facility] OR;  Service: Cardiovascular;  Laterality: N/A;   COLONOSCOPY WITH PROPOFOL  N/A 03/03/2015   Procedure: COLONOSCOPY WITH PROPOFOL ;  Surgeon: Gladis MARLA Louder, MD;  Location: [facility] ENDOSCOPY;  Service: Gastroenterology;  Laterality: N/A;   double j stent placement  06/2004   left   ESOPHAGOGASTRODUODENOSCOPY (EGD) WITH PROPOFOL  N/A 03/03/2015   Procedure: ESOPHAGOGASTRODUODENOSCOPY (EGD) WITH PROPOFOL ;  Surgeon: Gladis MARLA Louder,  MD;  Location: [facility] ENDOSCOPY;  Service: Gastroenterology;  Laterality: N/A;   ESOPHAGOGASTRODUODENOSCOPY (EGD) WITH PROPOFOL  N/A 10/25/2016   Procedure: ESOPHAGOGASTRODUODENOSCOPY (EGD) WITH PROPOFOL ;  Surgeon: Gladis MARLA Louder, MD;  Location: [facility] ENDOSCOPY;  Service: Endoscopy;  Laterality: N/A;   INGUINAL HERNIA REPAIR     as a child; ? right   INGUINAL HERNIA REPAIR Left 06/23/2016   Procedure: OPEN LEFT INGUINAL HERNIA  REPAIR WITH MESH;  Surgeon: Donnice Gladis, MD;  Location: [facility];  Service: General;  Laterality: Left;   INSERTION OF MESH Left 06/23/2016   Procedure: INSERTION OF MESH;  Surgeon: Donnice Gladis, MD;  Location: [facility];  Service: General;  Laterality: Left;   LEFT HEART CATH AND CORONARY ANGIOGRAPHY N/A 07/25/2018   Procedure: LEFT HEART CATH AND CORONARY ANGIOGRAPHY;  Surgeon: Claudene Victory ORN, MD;  Location: [facility] INVASIVE CV LAB;  Service: Cardiovascular;  Laterality: N/A;   LUNG BIOPSY  1993   NASAL SEPTOPLASTY W/ TURBINOPLASTY  07/2006   prostate biopsy  2011   schwannoma removal  ~01/2010   left thorax   TEE WITHOUT CARDIOVERSION N/A 09/27/2012   Procedure: TRANSESOPHAGEAL ECHOCARDIOGRAM (TEE);  Surgeon: Ezra GORMAN Shuck, MD;  Location: [facility] ENDOSCOPY;  Service: Cardiovascular;  Laterality: N/A;   ULTRASOUND GUIDANCE FOR VASCULAR ACCESS  07/25/2018   Procedure: Ultrasound Guidance For Vascular Access;  Surgeon: Claudene Victory ORN, MD;  Location: [facility] INVASIVE CV LAB;  Service: Cardiovascular;;   UMBILICAL  HERNIA REPAIR  07/2006    FAMILY HISTORY: Family History  Problem Relation Age of Onset   Atrial fibrillation Sister    Stroke Other    Atrial fibrillation Other        sister has had 2 afib ablations   Alzheimer's disease Neg Hx    Dementia Neg Hx    Migraines Neg Hx    Seizures Neg Hx    Sleep apnea Neg Hx     SOCIAL HISTORY: Social History   Socioeconomic History   Marital status: Married    Spouse name: Not on file   Number of children: Not on file   Years of education: Not on file   Highest education level: Not on file  Occupational History   Not on file  Tobacco Use   Smoking status: Never   Smokeless tobacco: Never  Vaping Use   Vaping status: Never Used  Substance and Sexual Activity   Alcohol use: Yes    Alcohol/week: 4.0 - 8.0 standard drinks of alcohol    Types: 4 - 8 Standard drinks or equivalent per week    Comment: 1-2 drinks around 4 times a week   Drug use: No    Comment: 05-11-2016 per pt no   Sexual activity: Yes  Other Topics Concern   Not on file  Social History Narrative   ** Merged History Encounter ** ENT physician in [place].   3-4 sodas daily    Social Drivers of Health   Tobacco Use: Low Risk (08/03/2024)   Patient History    Smoking Tobacco Use: Never    Smokeless Tobacco Use: Never    Passive Exposure: Not on file  Financial Resource Strain: Not on file  Food Insecurity: Low Risk (10/04/2023)   Received from [facility]   Epic    Within the past 12 months, you worried that your food would run out before you got money to buy more: Never true    Within the past 12 months, the food you bought  just didn't last and you didn't have money to get more. : Never true  Transportation Needs: No Transportation Needs (10/04/2023)   Received from [facility]    In the past 12 months, has lack of reliable transportation kept you from medical appointments, meetings, work or from getting things needed for daily living? : No   Physical Activity: Not on file  Stress: Not on file  Social Connections: Not on file  Intimate Partner Violence: Not on file  Depression (EYV7-0): Not on file  Alcohol Screen: Not on file  Housing: Low Risk (10/04/2023)   Received from [facility]   Epic    What is your living situation today?: I have a steady place to live    Think about the place you live. Do you have problems with any of the following? Choose all that apply:: None/None on this list  Utilities: Low Risk (10/04/2023)   Received from [facility]   Utilities    In the past 12 months has the electric, gas, oil, or water company threatened to shut off services in your home? : No  Health Literacy: Not on file     PHYSICAL EXAM  GENERAL EXAM/CONSTITUTIONAL: Vitals:  Vitals:   07/31/24 1546  BP: 132/78  Pulse: 84  Weight: 244 lb 6.4 oz (110.9 kg)  Height: 6' 0.5 (1.842 m)   Body mass index is 32.69 kg/m. Wt Readings from Last 3 Encounters:  08/01/24 225 lb (102.1 kg)  07/31/24 244 lb 6.4 oz (110.9 kg)  07/03/24 221 lb 12.8 oz (100.6 kg)   Patient is in no distress; well developed, nourished and groomed; neck is supple  CARDIOVASCULAR: Examination of carotid arteries is normal; no carotid bruits Regular rate and rhythm, no murmurs Examination of peripheral vascular system by observation and palpation is normal  EYES: Ophthalmoscopic exam of optic discs and posterior segments is normal; no papilledema or hemorrhages No results found.  MUSCULOSKELETAL: Gait, strength, tone, movements noted in Neurologic exam below  NEUROLOGIC: MENTAL STATUS:      No data to display            07/31/2024    3:53 PM 04/20/2022    8:43 AM 04/14/2021   10:48 AM  Montreal Cognitive Assessment   Visuospatial/ Executive (0/5) 4 4 4   Naming (0/3) 3 3 3   Attention: Read list of digits (0/2) 2 2 2   Attention: Read list of letters (0/1) 1 1 1   Attention: Serial 7 subtraction starting at 100 (0/3) 3 3 3   Language:  Repeat phrase (0/2) 1 2 2   Language : Fluency (0/1) 1 1 1   Abstraction (0/2) 2 2 2   Delayed Recall (0/5) 5 5 4   Orientation (0/6) 5 6 6   Total 27 29 28   Adjusted Score (based on education) 27     awake, alert, oriented to person, place and time recent and remote memory intact normal attention and concentration language fluent, comprehension intact, naming intact fund of knowledge appropriate  CRANIAL NERVE:  2nd - no papilledema on fundoscopic exam 2nd, 3rd, 4th, 6th - pupils equal and reactive to light, visual fields full to confrontation, extraocular muscles intact, no nystagmus 5th - facial sensation symmetric 7th - facial strength symmetric 8th - hearing intact 9th - palate elevates symmetrically, uvula midline 11th - shoulder shrug symmetric 12th - tongue protrusion midline  MOTOR:  normal bulk and tone, full strength in the BUE, BLE  SENSORY:  normal and symmetric to light touch, temperature,  vibration; ABSENT VIBRATION AT ANKLES AND TOES  COORDINATION:  finger-nose-finger, fine finger movements normal  REFLEXES:  deep tendon reflexes TRACE and symmetric  GAIT/STATION:  narrow based gait; SLIGHTLY STOOPED POSTURE     DIAGNOSTIC DATA (LABS, IMAGING, TESTING) - I reviewed patient records, labs, notes, testing and imaging myself where available.  Lab Results  Component Value Date   WBC 13.0 (H) 12/17/2023   HGB 15.8 12/17/2023   HCT 48.6 12/17/2023   MCV 89.2 12/17/2023   PLT 274 12/17/2023      Component Value Date/Time   NA 145 12/17/2023 1435   NA 146 (H) 08/18/2023 0823   K 4.6 12/17/2023 1435   CL 105 12/17/2023 1435   CO2 25 12/17/2023 1435   GLUCOSE 108 (H) 12/17/2023 1435   BUN 29 (H) 12/17/2023 1435   BUN 25 08/18/2023 0823   CREATININE 1.85 (H) 12/17/2023 1435   CREATININE 1.58 (H) 04/30/2019 1159   CREATININE 1.50 (H) 07/30/2011 0939   CALCIUM  10.7 (H) 12/17/2023 1435   PROT 7.7 12/17/2023 1435   PROT 6.7 05/18/2023 1636   ALBUMIN  4.5  12/17/2023 1435   AST 29 12/17/2023 1435   AST 23 04/30/2019 1159   ALT 20 12/17/2023 1435   ALT 21 04/30/2019 1159   ALKPHOS 71 12/17/2023 1435   BILITOT 0.5 12/17/2023 1435   BILITOT 0.7 04/30/2019 1159   GFRNONAA 39 (L) 12/17/2023 1435   GFRNONAA 46 (L) 04/30/2019 1159   GFRAA 56 (L) 08/23/2019 0929   GFRAA 53 (L) 04/30/2019 1159   No results found for: CHOL, HDL, LDLCALC, LDLDIRECT, TRIG, CHOLHDL No results found for: YHAJ8R Lab Results  Component Value Date   VITAMINB12 436 05/18/2023   Lab Results  Component Value Date   TSH 1.760 05/18/2023   05/18/23 ATN    Component Ref Range & Units (hover) 1 yr ago  A -- Beta-amyloid 42/40 Ratio 0.119  Beta-amyloid 42 36.45  Beta-amyloid 40 306.88  T -- p-tau181 1.79 High   N -- NfL, Plasma 3.53  ATN SUMMARY Comment  Comment:                        A- T+ N- A high pTau181 concentration was observed. A normal beta-amyloid 42/40 ratio and normal concentration of NfL were observed at this time. These results are not consistent with the presence of Alzheimer's- related pathology, but may indicate pathology of another condition.   05/18/23  APO E Genotyping Result: E2/E2  Interpretation: Comment  Comment: Negative for the APOE4 variant that is associated with increased risk for late onset Alzheimer's disease (AD). APOE2 may have some protective effect against the development of AD.   07/29/23 amyloid PET Negative scan indicating only sparse or no neuritic beta- amyloid plaques which is inconsistent with a pathologic diagnosis of Alzheimer's disease.  04/13/21 MRI brain [I reviewed images myself and agree with interpretation. -VRP]  - No evidence of recent infarction, hemorrhage, or mass. Mild chronic microvascular ischemic changes. Tiny bilateral chronic cerebellar infarcts.   ASSESSMENT AND PLAN  69 y.o. year old male here with:   Dx:  1. Mild memory disturbance   2. Neuropathy     PLAN:  MILD  MEMORY SYMPTOMS (likely normal aging vs MCI; MoCA 27/30; amyloid PET scan, ATN panel negative for alzheimer's pathology) - try to stay active physically and get some exercise (at least 15-30 minutes per day) - eat a nutritious diet with lean protein, plants / vegetables,  whole grains; avoid ultra-processed foods - increase social activities, brain stimulation, games, puzzles, hobbies, crafts, arts, music; try new activities; keep it fun! - aim for at least 7-8 hours sleep per night (or more) - avoid smoking and alcohol  NEUROPATHY (age related / idiopathic) - mild, stable; fall precautions and foot hygiene reviewed  Return for pending if symptoms worsen or fail to improve.    EDUARD FABIENE HANLON, MD 08/06/2024, 6:31 PM Certified in Neurology, Neurophysiology and Neuroimaging  [facility] [address] [place], [place] [address] [phone]     [1]  Allergies Allergen Reactions   Aldactone  [Spironolactone ]     Breast tissue enlargement   Codeine Other (See Comments)    Headache    Dapagliflozin Pro-Metformin Er     Other Reaction(s): bladder irritation   Keflex [Cephalexin] Diarrhea   Lisinopril Cough   Nexium [Esomeprazole Magnesium ] Diarrhea   Sulfa Antibiotics Rash   Sulfa Drugs Cross Reactors Rash   "

## 2024-08-01 ENCOUNTER — Ambulatory Visit: Attending: Cardiovascular Disease | Admitting: Internal Medicine

## 2024-08-01 ENCOUNTER — Encounter: Payer: Self-pay | Admitting: Internal Medicine

## 2024-08-01 VITALS — BP 142/82 | HR 66 | Ht 72.5 in | Wt 225.0 lb

## 2024-08-01 DIAGNOSIS — I719 Aortic aneurysm of unspecified site, without rupture: Secondary | ICD-10-CM | POA: Diagnosis present

## 2024-08-01 DIAGNOSIS — D6869 Other thrombophilia: Secondary | ICD-10-CM | POA: Diagnosis present

## 2024-08-01 DIAGNOSIS — I480 Paroxysmal atrial fibrillation: Secondary | ICD-10-CM | POA: Diagnosis present

## 2024-08-01 DIAGNOSIS — I2510 Atherosclerotic heart disease of native coronary artery without angina pectoris: Secondary | ICD-10-CM | POA: Diagnosis present

## 2024-08-01 DIAGNOSIS — E7849 Other hyperlipidemia: Secondary | ICD-10-CM | POA: Insufficient documentation

## 2024-08-01 NOTE — Progress Notes (Signed)
 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 Claudene before his retirement.  He underwent cardiac catheterization in 2019 which showed no angiographic coronary disease however he did have a CT in preparation for A-fib ablation back in 2021.  This demonstrated a CAC score of 174, 65th percentile.  The ascending aorta was dilated at 45 mm at the sinus of Valsalva and 42 mm at the mid a ascending aorta level.  He had a repeat echocardiogram in March of this year when he had recurrent atrial fibrillation.  That echo showed normal LV function at 60 to 65% with grade 1 diastolic dysfunction and moderate left atrial enlargement.  Aortic dilatation was again noted with moderate enlargement of the ascending aorta at 43 mm.  He has not had reassessment since then.  He had not been on statin therapy and recently had seen Dr. Cindie who recommended 20 mg atorvastatin .  Previously had been on 10 mg but noted that his cholesterol has always been very low.  Despite that he has a degree of age advanced coronary calcification.  He notes no early onset heart disease in the family.  He has undergone a bevy of test recently for concern of possible memory issues.  This included APO E testing which showed that he is in E2/E2 variant.  He is also followed by Dr. Faythe in endocrinology and was found to have hyperaldosteronism and is on eplerenone .  Renal function is monitored by Dr. Prescilla.  Surprisingly, we were not able to locate any recent lipid testing.  I have reviewed labs both through [facility], care everywhere, [facility] in our own records.  Leaves he did have lipid testing this past year but had not been able to locate that as well.   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  Overall he seems to be doing fairly  well.  He recently saw EP and was not recommended for follow-up.  He did have a CT angiogram of the aorta which showed a 42 mm ascending aorta.  This had previously measured up to 45 mm at the sinus of Valsalva and 42 mm at the mid ascending aorta level.  It was felt that this was stable.  Blood pressure has been fairly well-controlled although was elevated somewhat today 142/82.  He is due for repeat imaging.  He does have some chronic kidney disease monitored by Dr. Prescilla.  GFR is between 35 and 40.  PMHx:  Past Medical History:  Diagnosis Date   Adrenal cortical hyperfunction    ongoing evaluation for elevated R>L adosterone secretion   Arthritis    Atrial enlargement, bilateral    Atrial fibrillation (HCC)    paroxysmal   Atrial flutter (HCC)    Complication of anesthesia    could not urinate after UHR, needed cath, mental fog sinc cervical fusion 08-09-16    Dysrhythmia    a-fib-had ablation x2   GERD (gastroesophageal reflux disease)    Hyperaldosteronism    Hypertension    Kidney cysts    multiple   Nephrolithiasis    Pneumonia 1990's   Pre-diabetes    Prostatitis    Renal insufficiency    Cr 1.6    Past Surgical History:  Procedure Laterality Date   ABLATION  ATRIAL FIBRILLATION ABLATION N/A 09/28/2012   Procedure: ATRIAL FIBRILLATION ABLATION;  Surgeon: Lynwood Rakers, MD;  Location[facility] CATH LAB;  Service: Cardiovascular;  Laterality: N/A;   atrial fibrillation ablation with CTI ablation  05/27/11, 09/28/12   ablation for afib and atrial flutter by Dr Rakers [facility]  08/09/2016   C 5 6  C 6 to C 7 cervical fusion   CARDIOVERSION  07/28/2011   Procedure: CARDIOVERSION;  Surgeon: Redell GORMAN Shallow, MD;  Location: [facility] OR;  Service: Cardiovascular;  Laterality: N/A;   COLONOSCOPY WITH PROPOFOL  N/A 03/03/2015   Procedure: COLONOSCOPY WITH PROPOFOL ;  Surgeon: Gladis MARLA Louder, MD;  Location: [facility] ENDOSCOPY;  Service: Gastroenterology;  Laterality: N/A;   double j  stent placement  06/2004   left   ESOPHAGOGASTRODUODENOSCOPY (EGD) WITH PROPOFOL  N/A 03/03/2015   Procedure: ESOPHAGOGASTRODUODENOSCOPY (EGD) WITH PROPOFOL ;  Surgeon: Gladis MARLA Louder, MD;  Location: [facility] ENDOSCOPY;  Service: Gastroenterology;  Laterality: N/A;   ESOPHAGOGASTRODUODENOSCOPY (EGD) WITH PROPOFOL  N/A 10/25/2016   Procedure: ESOPHAGOGASTRODUODENOSCOPY (EGD) WITH PROPOFOL ;  Surgeon: Gladis MARLA Louder, MD;  Location: [facility] ENDOSCOPY;  Service: Endoscopy;  Laterality: N/A;   INGUINAL HERNIA REPAIR     as a child; ? right   INGUINAL HERNIA REPAIR Left 06/23/2016   Procedure: OPEN LEFT INGUINAL HERNIA  REPAIR WITH MESH;  Surgeon: Donnice Gladis, MD;  Location: [facility];  Service: General;  Laterality: Left;   INSERTION OF MESH Left 06/23/2016   Procedure: INSERTION OF MESH;  Surgeon: Donnice Gladis, MD;  Location: [facility];  Service: General;  Laterality: Left;   LEFT HEART CATH AND CORONARY ANGIOGRAPHY N/A 07/25/2018   Procedure: LEFT HEART CATH AND CORONARY ANGIOGRAPHY;  Surgeon: Claudene Victory ORN, MD;  Location: [facility] INVASIVE CV LAB;  Service: Cardiovascular;  Laterality: N/A;   LUNG BIOPSY  1993   NASAL SEPTOPLASTY W/ TURBINOPLASTY  07/2006   prostate biopsy  2011   schwannoma removal  ~01/2010   left thorax   TEE WITHOUT CARDIOVERSION N/A 09/27/2012   Procedure: TRANSESOPHAGEAL ECHOCARDIOGRAM (TEE);  Surgeon: Ezra GORMAN Shuck, MD;  Location: [facility] ENDOSCOPY;  Service: Cardiovascular;  Laterality: N/A;   ULTRASOUND GUIDANCE FOR VASCULAR ACCESS  07/25/2018   Procedure: Ultrasound Guidance For Vascular Access;  Surgeon: Claudene Victory ORN, MD;  Location: [facility] INVASIVE CV LAB;  Service: Cardiovascular;;   UMBILICAL HERNIA REPAIR  07/2006    FAMHx:  Family History  Problem Relation Age of Onset   Atrial fibrillation Sister    Stroke Other    Atrial fibrillation Other        sister has had 2 afib ablations   Alzheimer's disease Neg Hx    Dementia Neg Hx    Migraines Neg  Hx    Seizures Neg Hx    Sleep apnea Neg Hx     SOCHx:   reports that he has never smoked. He has never used smokeless tobacco. He reports current alcohol use of about 4.0 - 8.0 standard drinks of alcohol per week. He reports that he does not use drugs.  ALLERGIES:  Allergies[1]  ROS: Pertinent items noted in HPI and remainder of comprehensive ROS otherwise negative.  HOME MEDS: Medications Ordered Prior to Encounter[2]  LABS/IMAGING: No results found for this or any previous visit (from the past 48 hours). No results found.  LIPID PANEL: No results found for: CHOL, TRIG, HDL, CHOLHDL, VLDL, LDLCALC, LDLDIRECT  Lipoprotein (a)  Date/Time Value Ref Range Status  08/18/2023 08:26 AM 61.4 <  75.0 nmol/L Final    Comment:    Note:  Values greater than or equal to 75.0 nmol/L may        indicate an independent risk factor for CHD,        but must be evaluated with caution when applied        to non-Caucasian populations due to the        influence of genetic factors on Lp(a) across        ethnicities.        WEIGHTS: Wt Readings from Last 3 Encounters:  08/01/24 225 lb (102.1 kg)  07/31/24 244 lb 6.4 oz (110.9 kg)  07/03/24 221 lb 12.8 oz (100.6 kg)    VITALS: BP (!) 142/82   Pulse 66   Ht 6' 0.5 (1.842 m)   Wt 225 lb (102.1 kg)   SpO2 95%   BMI 30.10 kg/m   EXAM: General appearance: alert and no distress Lungs: clear to auscultation bilaterally Heart: regular rate and rhythm, S1, S2 normal, no murmur, click, rub or gallop Extremities: extremities normal, atraumatic, no cyanosis or edema Neurologic: Grossly normal  EKG: Deferred  ASSESSMENT: Mixed dyslipidemia Type 2 diabetes Hyperaldosteronism CAC score of 174, 65th percentile (07/2019) Aortic aneurysm with a 45 mm sinus of Valsalva and 42-43 mm ascending aorta LVEF 60 to 65%, moderate LAE, grade 1 diastolic dysfunction (09/2022) Paroxysmal atrial fibrillation CKD stage IIIb  PLAN: 1.    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  He denies any recurrent atrial fibrillation.  He is no longer following with EP.  He did have some coronary calcification in the past but is asymptomatic without chest pain or shortness of breath.  Repeat CT angio of the aorta shows fairly stable findings as of January 2025.  He needs repeat imaging but does have stage IIIb chronic kidney disease.  I would like to proceed with an MRA in order to reduce contrast load at this point.  I will reach out to him with the results of that study.  Plan otherwise follow-up annually or sooner as necessary  Vinie KYM Maxcy, MD, [facility], FNLA, FACP  [place]  [facility]  Medical Director of the Advanced Lipid Disorders &  Cardiovascular Risk Reduction Clinic Diplomate of the American Board of Clinical Lipidology Attending Cardiologist  Direct Dial: [phone]  Fax: [phone]  Website:  [email or web address]   Vinie JAYSON Maxcy 08/01/2024, 4:39 PM    [1]  Allergies Allergen Reactions   Aldactone  [Spironolactone ]     Breast tissue enlargement   Codeine Other (See Comments)    Headache    Dapagliflozin Pro-Metformin Er     Other Reaction(s): bladder irritation   Keflex [Cephalexin] Diarrhea   Lisinopril Cough   Nexium [Esomeprazole Magnesium ] Diarrhea   Sulfa Antibiotics Rash   Sulfa Drugs Cross Reactors Rash  [2]  Current Outpatient Medications on File Prior to Visit  Medication Sig Dispense Refill   allopurinol  (ZYLOPRIM ) 100 MG tablet Take 100 mg by mouth 2 (two) times daily.     ammonium lactate  (LAC-HYDRIN ) 12 % lotion Apply 1 Application topically as needed for dry skin. 400 g 5   apixaban  (ELIQUIS ) 5 MG TABS tablet Take 1 tablet (5 mg total) by mouth 2 (two) times daily. 180 tablet 3   Ascorbic Acid  (VITAMIN C ) 1000 MG tablet Take 1,000 mg by mouth 3 (three) times a week.     atorvastatin  (LIPITOR) 20 MG tablet Take 1 tablet (20 mg total) by mouth daily. 90  tablet 3   BREZTRI  AEROSPHERE 160-9-4.8  MCG/ACT AERO SMARTSIG:2 Puff(s) By Mouth Twice Daily (Patient taking differently: Inhale 2 puffs into the lungs. As needed)     CIALIS 20 MG tablet Take 20 mg by mouth daily as needed for erectile dysfunction.  11   diltiazem  (CARDIZEM  CD) 360 MG 24 hr capsule Take 360 mg by mouth every evening.  7   eplerenone  (INSPRA ) 50 MG tablet Take 1 tablet (50 mg total) by mouth 2 (two) times daily. 180 tablet 3   FIBER PO Taking 2 gummy by mouth daily     finasteride (PROSCAR) 5 MG tablet Take 5 mg by mouth daily.     Glucosamine 500 MG CAPS  (Patient taking differently: as needed.)     hydrochlorothiazide  (MICROZIDE ) 12.5 MG capsule Take 12.5 mg by mouth daily.     losartan  (COZAAR ) 100 MG tablet Take 100 mg by mouth daily.     metFORMIN (GLUCOPHAGE) 500 MG tablet Take 500 mg by mouth every morning.     metoprolol  tartrate (LOPRESSOR ) 25 MG tablet Take 1 tablet by mouth every 8 hours as needed for afib HR over 100 (Patient taking differently: as needed. Take 1 tablet by mouth every 8 hours as needed for afib HR over 100) 30 tablet 1   montelukast  (SINGULAIR ) 10 MG tablet TAKE 1 TABLET(10 MG) BY MOUTH AT BEDTIME 30 tablet 11   Multiple Vitamin (MULTIVITAMIN) capsule Take 1 capsule by mouth 3 (three) times a week.     omeprazole (PRILOSEC) 40 MG capsule Take 40 mg by mouth daily before supper.      potassium chloride  SA (KLOR-CON  M) 20 MEQ tablet TAKE 2 TABLETS BY MOUTH TWICE DAILY TODAY(AT LEAST 2 HOURS APART) FOR A TOTAL OF 80 MEQ AND THEN START 1 TABLET(20 MEQ) ONCE DAILY THEREAFTER 90 tablet 2   PROAIR  HFA 108 (90 Base) MCG/ACT inhaler SMARTSIG:2 Inhalation Via Inhaler Every 6 Hours PRN (Patient taking differently: as needed. As needed)     scopolamine  (TRANSDERM-SCOP) 1 MG/3DAYS Place 1 patch onto the skin as needed.     tamsulosin (FLOMAX) 0.4 MG CAPS capsule Take 0.4 mg by mouth every morning.     TRULICITY 0.75 MG/0.5ML SOAJ Inject 0.75 mg into the skin once a week.     vitamin B-12 (CYANOCOBALAMIN )  1000 MCG tablet Take 1,000 mcg by mouth daily.     Cholecalciferol (VITAMIN D -3 PO) Take by mouth. (Patient not taking: Reported on 08/01/2024)     ciprofloxacin (CIPRO) 500 MG tablet Take 500 mg by mouth 2 (two) times daily. (Patient not taking: Reported on 08/01/2024)     Multiple Vitamins-Minerals (ZINC PO) Take 1 tablet by mouth as needed. (Patient not taking: Reported on 08/01/2024)     MYRBETRIQ 50 MG TB24 tablet Take 50 mg by mouth daily. (Patient not taking: Reported on 08/01/2024)     sertraline  (ZOLOFT ) 100 MG tablet Take 100 mg by mouth daily. (Patient not taking: Reported on 08/01/2024)     XIFAXAN 550 MG TABS tablet Take 550 mg by mouth 3 (three) times daily. (Patient not taking: Reported on 08/01/2024)     XIGDUO XR 11-998 MG TB24 Take 1 tablet by mouth daily. (Patient not taking: Reported on 08/01/2024)     No current facility-administered medications on file prior to visit.   "

## 2024-08-01 NOTE — Patient Instructions (Signed)
 Medication Instructions:   NO CHANGES  *If you need a refill on your cardiac medications before your next appointment, please call your pharmacy*  Testing/Procedures:  MRA chest/aorta at [facility]  Follow-Up: At [facility], you and your health needs are our priority.  As part of our continuing mission to provide you with exceptional heart care, our providers are all part of one team.  This team includes your primary Cardiologist (physician) and Advanced Practice Providers or APPs (Physician Assistants and Nurse Practitioners) who all work together to provide you with the care you need, when you need it.  Your next appointment:    12 months with Dr. Mona  We recommend signing up for the patient portal called MyChart.  Sign up information is provided on this After Visit Summary.  MyChart is used to connect with patients for Virtual Visits (Telemedicine).  Patients are able to view lab/test results, encounter notes, upcoming appointments, etc.  Non-urgent messages can be sent to your provider as well.   To learn more about what you can do with MyChart, go to [email or web address].   Other Instructions

## 2024-08-02 ENCOUNTER — Ambulatory Visit (HOSPITAL_BASED_OUTPATIENT_CLINIC_OR_DEPARTMENT_OTHER): Payer: Self-pay | Attending: Internal Medicine | Admitting: Physical Therapy

## 2024-08-02 DIAGNOSIS — M62838 Other muscle spasm: Secondary | ICD-10-CM | POA: Diagnosis present

## 2024-08-02 DIAGNOSIS — M25551 Pain in right hip: Secondary | ICD-10-CM | POA: Insufficient documentation

## 2024-08-02 NOTE — Therapy (Signed)
 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 DOB:1955-08-27, 69 y.o., male Today's Date: 08/03/2024  END OF SESSION:  PT End of Session - 08/03/24 1452     Visit Number 1    Number of Visits 8    Date for Recertification  09/28/24    PT Start Time 1015    PT Stop Time 1058    PT Time Calculation (min) 43 min    Activity Tolerance Patient tolerated treatment well    Behavior During Therapy WFL for tasks assessed/performed          Past Medical History:  Diagnosis Date   Adrenal cortical hyperfunction    ongoing evaluation for elevated R>L adosterone secretion   Arthritis    Atrial enlargement, bilateral    Atrial fibrillation (HCC)    paroxysmal   Atrial flutter (HCC)    Complication of anesthesia    could not urinate after UHR, needed cath, mental fog sinc cervical fusion 08-09-16    Dysrhythmia    a-fib-had ablation x2   GERD (gastroesophageal reflux disease)    Hyperaldosteronism    Hypertension    Kidney cysts    multiple   Nephrolithiasis    Pneumonia 1990's   Pre-diabetes    Prostatitis    Renal insufficiency    Cr 1.6   Past Surgical History:  Procedure Laterality Date   ABLATION     ATRIAL FIBRILLATION ABLATION N/A 09/28/2012   Procedure: ATRIAL FIBRILLATION ABLATION;  Surgeon: Lynwood Rakers, MD;  Location: [facility] CATH LAB;  Service: Cardiovascular;  Laterality: N/A;   atrial fibrillation ablation with CTI ablation  05/27/11, 09/28/12   ablation for afib and atrial flutter by Dr Rakers [facility]  08/09/2016   C 5 6  C 6 to C 7 cervical fusion   CARDIOVERSION  07/28/2011   Procedure: CARDIOVERSION;  Surgeon: Redell GORMAN Shallow, MD;  Location: [facility];  Service: Cardiovascular;  Laterality: N/A;   COLONOSCOPY WITH PROPOFOL  N/A 03/03/2015   Procedure: COLONOSCOPY WITH PROPOFOL ;  Surgeon: Gladis MARLA Louder, MD;  Location: [facility] ENDOSCOPY;  Service: Gastroenterology;  Laterality: N/A;   double j stent  placement  06/2004   left   ESOPHAGOGASTRODUODENOSCOPY (EGD) WITH PROPOFOL  N/A 03/03/2015   Procedure: ESOPHAGOGASTRODUODENOSCOPY (EGD) WITH PROPOFOL ;  Surgeon: Gladis MARLA Louder, MD;  Location: [facility] ENDOSCOPY;  Service: Gastroenterology;  Laterality: N/A;   ESOPHAGOGASTRODUODENOSCOPY (EGD) WITH PROPOFOL  N/A 10/25/2016   Procedure: ESOPHAGOGASTRODUODENOSCOPY (EGD) WITH PROPOFOL ;  Surgeon: Gladis MARLA Louder, MD;  Location: [facility] ENDOSCOPY;  Service: Endoscopy;  Laterality: N/A;   INGUINAL HERNIA REPAIR     as a child; ? right   INGUINAL HERNIA REPAIR Left 06/23/2016   Procedure: OPEN LEFT INGUINAL HERNIA  REPAIR WITH MESH;  Surgeon: Donnice Gladis, MD;  Location: [facility];  Service: General;  Laterality: Left;   INSERTION OF MESH Left 06/23/2016   Procedure: INSERTION OF MESH;  Surgeon: Donnice Gladis, MD;  Location: [facility];  Service: General;  Laterality: Left;   LEFT HEART CATH AND CORONARY ANGIOGRAPHY N/A 07/25/2018   Procedure: LEFT HEART CATH AND CORONARY ANGIOGRAPHY;  Surgeon: Claudene Victory ORN, MD;  Location: [facility] INVASIVE CV LAB;  Service: Cardiovascular;  Laterality: N/A;   LUNG BIOPSY  1993   NASAL SEPTOPLASTY W/ TURBINOPLASTY  07/2006   prostate biopsy  2011   schwannoma removal  ~01/2010   left thorax   TEE WITHOUT CARDIOVERSION N/A 09/27/2012  Procedure: TRANSESOPHAGEAL ECHOCARDIOGRAM (TEE);  Surgeon: Ezra GORMAN Shuck, MD;  Location: [facility] ENDOSCOPY;  Service: Cardiovascular;  Laterality: N/A;   ULTRASOUND GUIDANCE FOR VASCULAR ACCESS  07/25/2018   Procedure: Ultrasound Guidance For Vascular Access;  Surgeon: Claudene Victory ORN, MD;  Location[facility] INVAS[facility] CV LAB;  Service: Cardiovascular;;   UMBILICAL HERNIA REPAIR  07/2006   Patient Active Problem List   Diagnosis Date Noted   Spinal stenosis of lumbar region with neurogenic claudication 11/14/2023   Hoarseness 10/04/2023   Primary osteoarthritis both carpometacarpal joints and right second metacarpophalangeal  joint 09/27/2023   Hypercoagulable state due to paroxysmal atrial fibrillation (HCC) 11/10/2022   Asthma, intermittent 11/01/2022   Status post implantation of urinary electronic stimulator device 10/12/2022   Trochanteric bursitis, left hip 02/22/2022   Bilateral impacted cerumen 10/23/2020   Laryngopharyngeal reflux (LPR) 10/23/2020   Vocal cord nodule 10/23/2020   Bilateral elbow joint pain 06/25/2020   Lateral epicondylitis of left elbow 06/25/2020   Moderate COPD (chronic obstructive pulmonary disease) (HCC) 03/26/2020   OSA (obstructive sleep apnea) 03/26/2020   Pulmonary emboli (HCC) 03/14/2019   Chronic respiratory failure with hypoxia (HCC) 08/18/2018   Hyperlipidemia 08/17/2018   CKD (chronic kidney disease), stage II 08/16/2018   Abnormal ECG 07/25/2018   Incontinence of feces 04/18/2018   Urge incontinence of urine 03/11/2018   Renal insufficiency    Prostatitis    Pre-diabetes    Nephrolithiasis    Kidney cysts    Hyperaldosteronism    GERD (gastroesophageal reflux disease)    Dysrhythmia    Complication of anesthesia    Atrial enlargement, bilateral    Arthritis    Adrenal cortical hyperfunction    Inguinal hernia of left side without obstruction or gangrene Nov 2017 06/23/2016   Adjustment disorder with depressed mood 05/11/2016   Atrial fibrillation (HCC) 03/18/2011   Atrial flutter (HCC) 03/18/2011   Hypertension 03/18/2011    PCP: None on file   REFERRING PROVIDER:  Valery Hopper MD  REFERRING DIAG:  Right hip pain   THERAPY DIAG:  Pain in right hip  Other muscle spasm  Rationale for Evaluation and Treatment: Rehabilitation  ONSET DATE:   SUBJECTIVE:   SUBJECTIVE STATEMENT: The patient reports acute onset of right hip pain several months ago.  Over the past month or so he has been going to stretch zone and having personal training sessions.  He reports in that time the pain has improved significantly.  He reports no significant pain at  this time.  He would like to be checked out to see if there is anything that needs to be done for the future.  He is going to personal training twice a week.  PERTINENT HISTORY:  PAIN:  Are you having pain? Yes: NPRS scale: No current pain  Pain location: right hip pain  Pain description: aching  Aggravating factors: None  Relieving factors: went to the stretch zone   PRECAUTIONS: None  RED FLAGS: None   WEIGHT BEARING RESTRICTIONS: No  FALLS:  Has patient fallen in last 6 months? No  LIVING ENVIRONMENT:  OCCUPATION:  Retired EMT  Hobbies: Going to the gym  PLOF: Independent  PATIENT GOALS: To have less pain  NEXT MD VISIT:  Nothing scheduled at this time  OBJECTIVE:  Note: Objective measures were completed at Evaluation unless otherwise noted.  DIAGNOSTIC FINDINGS:  Nothing in the chart but patient reports he has had x-rays recently  PATIENT SURVEYS:  LEFS  Extreme difficulty/unable (0), Quite a bit  of difficulty (1), Moderate difficulty (2), Little difficulty (3), No difficulty (4) Survey date:    Any of your usual work, housework or school activities   2. Usual hobbies, recreational or sporting activities   3. Getting into/out of the bath   4. Walking between rooms   5. Putting on socks/shoes   6. Squatting    7. Lifting an object, like a bag of groceries from the floor   8. Performing light activities around your home   9. Performing heavy activities around your home   10. Getting into/out of a car   11. Walking 2 blocks   12. Walking 1 mile   13. Going up/down 10 stairs (1 flight)   14. Standing for 1 hour   15.  sitting for 1 hour   16. Running on even ground   17. Running on uneven ground   18. Making sharp turns while running fast   19. Hopping    20. Rolling over in bed   Score total:  57     COGNITION: Overall cognitive status: Within functional limits for tasks assessed     SENSATION: WFL Mild bilateral peripheral neuropathy      POSTURE: No Significant postural limitations  PALPATION:   LOWER EXTREMITY ROM:  Passive ROM Right eval Left eval  Hip flexion WNL   Hip extension    Hip abduction    Hip adduction    Hip internal rotation WNL   Hip external rotation WNL   Knee flexion    Knee extension    Ankle dorsiflexion    Ankle plantarflexion    Ankle inversion    Ankle eversion     (Blank rows = not tested)  LOWER EXTREMITY MMT:  MMT Right eval Left eval  Hip flexion 39.6 39.3  Hip extension    Hip abduction 41.4 41.1  Hip adduction    Hip internal rotation    Hip external rotation    Knee flexion    Knee extension 47.8 37.0  Ankle dorsiflexion    Ankle plantarflexion    Ankle inversion    Ankle eversion     (Blank rows = not tested)    GAIT: Walks flexed laterally                                                                                                                                TREATMENT DATE:  Manual: Reviewed trigger point release with tennis ball. Reviewed trigger point release with Thera cane and where to purchase.  Limited exercise review 2nd to patient having to leave early    PATIENT EDUCATION:  Education details: HEP, symptom management  Person educated: Patient Education method: Explanation, Demonstration, Tactile cues, Verbal cues, and Handouts Education comprehension: verbalized understanding, returned demonstration, verbal cues required, tactile cues required, and needs further education  HOME EXERCISE PROGRAM:   ASSESSMENT:  CLINICAL IMPRESSION: Patient is a 69 year old male with acute onset of  right hip pain which has since resolved.  At this time he has equal strength left and right.  He has mild spasming of his QL.  He has full hip range of motion.  He has mild limitations in trunk flexion.  He was shown self soft tissue mobilization techniques to his QL.  At this time he is doing well with his exercises given to him by his trainer.  He  has no significant need for skilled therapy at this time.  He is advised to continue working with his trainer and going to stretch zone.  We will leave his case open for 4 to 6 weeks.  He is advised if he has any kind of acute onset of pain to return to physical therapy.  He will otherwise continue with his current plan. OBJECTIVE IMPAIRMENTS: increased muscle spasms and postural dysfunction.   ACTIVITY LIMITATIONS: locomotion level  PARTICIPATION LIMITATIONS: long distance walking   PERSONAL FACTORS: 1-2 comorbidities: time since onset, iow back pain  are also affecting patient's functional outcome.   REHAB POTENTIAL: Good  CLINICAL DECISION MAKING: Stable/uncomplicated No pain at this time  EVALUATION COMPLEXITY: Low   GOALS: Goals reviewed with patient? Yes    LONG TERM GOALS: Target date: 09/28/2024     Patient will independently restate self soft tissue mobilization techniques Baseline:  Goal status: INITIAL  2.  Patient will demonstrate good form with gym exercises Baseline:  Goal status: INITIAL  3.  Patient will report no significant pain in his low back Baseline:  Goal status: INITIAL   PLAN:  PT FREQUENCY: 2x/week  PT DURATION: 8 weeks  PLANNED INTERVENTIONS: 97110-Therapeutic exercises, 97530- Therapeutic activity, 97112- Neuromuscular re-education, 97535- Self Care, [id number]- Manual therapy, [id number]- Electrical stimulation (unattended), Patient/Family education, Stair training, Joint mobilization, Spinal mobilization, and Cryotherapy  PLAN FOR NEXT SESSION: if patient returns assess current gym program    Alm JINNY Don, PT 08/03/2024, 2:56 PM  "

## 2024-08-03 ENCOUNTER — Encounter (HOSPITAL_BASED_OUTPATIENT_CLINIC_OR_DEPARTMENT_OTHER): Payer: Self-pay | Admitting: Physical Therapy

## 2024-08-09 ENCOUNTER — Encounter (HOSPITAL_BASED_OUTPATIENT_CLINIC_OR_DEPARTMENT_OTHER): Payer: Self-pay | Admitting: Physical Therapy

## 2024-08-16 ENCOUNTER — Encounter (HOSPITAL_BASED_OUTPATIENT_CLINIC_OR_DEPARTMENT_OTHER): Payer: Self-pay | Admitting: Physical Therapy

## 2024-08-17 ENCOUNTER — Ambulatory Visit (HOSPITAL_COMMUNITY)
Admission: RE | Admit: 2024-08-17 | Discharge: 2024-08-17 | Disposition: A | Source: Ambulatory Visit | Attending: Internal Medicine | Admitting: Internal Medicine

## 2024-08-17 DIAGNOSIS — I719 Aortic aneurysm of unspecified site, without rupture: Secondary | ICD-10-CM | POA: Diagnosis present

## 2024-08-17 MED ORDER — GADOBUTROL 1 MMOL/ML IV SOLN
10.0000 mL | Freq: Once | INTRAVENOUS | Status: AC | PRN
  Administered 2024-08-17: 10 mL via INTRAVENOUS

## 2024-08-21 ENCOUNTER — Ambulatory Visit: Payer: Self-pay | Admitting: Cardiovascular Disease
